# Patient Record
Sex: Female | Born: 1940 | Race: White | Hispanic: No | Marital: Single | Smoking: Former smoker
Health system: Southern US, Community
[De-identification: ages and names within clinical notes are randomized; demographics above are authoritative.]

## PROBLEM LIST (undated history)

## (undated) ENCOUNTER — Emergency Department (HOSPITAL_COMMUNITY): Admission: EM | Payer: Medicare Other | Source: Home / Self Care

## (undated) DIAGNOSIS — R011 Cardiac murmur, unspecified: Secondary | ICD-10-CM

## (undated) DIAGNOSIS — R1314 Dysphagia, pharyngoesophageal phase: Secondary | ICD-10-CM

## (undated) DIAGNOSIS — Z8739 Personal history of other diseases of the musculoskeletal system and connective tissue: Secondary | ICD-10-CM

## (undated) DIAGNOSIS — D649 Anemia, unspecified: Secondary | ICD-10-CM

## (undated) DIAGNOSIS — E7800 Pure hypercholesterolemia, unspecified: Secondary | ICD-10-CM

## (undated) DIAGNOSIS — G629 Polyneuropathy, unspecified: Secondary | ICD-10-CM

## (undated) DIAGNOSIS — M1990 Unspecified osteoarthritis, unspecified site: Secondary | ICD-10-CM

## (undated) DIAGNOSIS — B029 Zoster without complications: Secondary | ICD-10-CM

## (undated) DIAGNOSIS — S92901A Unspecified fracture of right foot, initial encounter for closed fracture: Secondary | ICD-10-CM

## (undated) DIAGNOSIS — R269 Unspecified abnormalities of gait and mobility: Secondary | ICD-10-CM

## (undated) DIAGNOSIS — I10 Essential (primary) hypertension: Secondary | ICD-10-CM

## (undated) DIAGNOSIS — F329 Major depressive disorder, single episode, unspecified: Secondary | ICD-10-CM

## (undated) DIAGNOSIS — G500 Trigeminal neuralgia: Secondary | ICD-10-CM

## (undated) DIAGNOSIS — M8580 Other specified disorders of bone density and structure, unspecified site: Secondary | ICD-10-CM

## (undated) DIAGNOSIS — N189 Chronic kidney disease, unspecified: Secondary | ICD-10-CM

## (undated) DIAGNOSIS — G118 Other hereditary ataxias: Secondary | ICD-10-CM

## (undated) DIAGNOSIS — C649 Malignant neoplasm of unspecified kidney, except renal pelvis: Secondary | ICD-10-CM

## (undated) DIAGNOSIS — F32A Depression, unspecified: Secondary | ICD-10-CM

## (undated) DIAGNOSIS — G119 Hereditary ataxia, unspecified: Secondary | ICD-10-CM

## (undated) HISTORY — DX: Unspecified abnormalities of gait and mobility: R26.9

## (undated) HISTORY — PX: VAGINAL HYSTERECTOMY: SUR661

## (undated) HISTORY — PX: TONSILLECTOMY: SUR1361

## (undated) HISTORY — DX: Unspecified fracture of right foot, initial encounter for closed fracture: S92.901A

## (undated) HISTORY — DX: Zoster without complications: B02.9

## (undated) HISTORY — DX: Other hereditary ataxias: G11.8

## (undated) HISTORY — DX: Polyneuropathy, unspecified: G62.9

## (undated) HISTORY — DX: Dysphagia, pharyngoesophageal phase: R13.14

## (undated) HISTORY — PX: DILATION AND CURETTAGE OF UTERUS: SHX78

## (undated) HISTORY — PX: BACK SURGERY: SHX140

## (undated) HISTORY — DX: Trigeminal neuralgia: G50.0

## (undated) HISTORY — PX: KNEE ARTHROSCOPY: SHX127

## (undated) HISTORY — PX: CATARACT EXTRACTION W/ INTRAOCULAR LENS IMPLANT: SHX1309

## (undated) HISTORY — PX: LUMBAR DISC SURGERY: SHX700

---

## 2010-06-30 ENCOUNTER — Encounter: Admission: RE | Admit: 2010-06-30 | Discharge: 2010-06-30 | Payer: Self-pay | Admitting: Geriatric Medicine

## 2010-08-18 ENCOUNTER — Encounter
Admission: RE | Admit: 2010-08-18 | Discharge: 2010-08-18 | Payer: Self-pay | Source: Home / Self Care | Attending: Geriatric Medicine | Admitting: Geriatric Medicine

## 2010-08-28 ENCOUNTER — Other Ambulatory Visit: Payer: Self-pay | Admitting: Geriatric Medicine

## 2010-08-28 DIAGNOSIS — N281 Cyst of kidney, acquired: Secondary | ICD-10-CM

## 2010-09-16 ENCOUNTER — Ambulatory Visit
Admission: RE | Admit: 2010-09-16 | Discharge: 2010-09-16 | Disposition: A | Discharge status: Home / Self Care | Payer: Medicare Other | Source: Ambulatory Visit | Attending: Geriatric Medicine | Admitting: Geriatric Medicine

## 2010-09-16 DIAGNOSIS — N281 Cyst of kidney, acquired: Secondary | ICD-10-CM

## 2010-09-27 ENCOUNTER — Other Ambulatory Visit: Payer: Self-pay | Admitting: Urology

## 2010-09-27 DIAGNOSIS — N2889 Other specified disorders of kidney and ureter: Secondary | ICD-10-CM

## 2010-10-05 ENCOUNTER — Ambulatory Visit (HOSPITAL_COMMUNITY)
Admission: RE | Admit: 2010-10-05 | Discharge: 2010-10-05 | Disposition: A | Discharge status: Home / Self Care | Payer: Medicare Other | Source: Ambulatory Visit | Attending: Urology | Admitting: Urology

## 2010-10-05 ENCOUNTER — Other Ambulatory Visit: Payer: Self-pay | Admitting: Diagnostic Radiology

## 2010-10-05 DIAGNOSIS — N2889 Other specified disorders of kidney and ureter: Secondary | ICD-10-CM

## 2010-10-05 DIAGNOSIS — I10 Essential (primary) hypertension: Secondary | ICD-10-CM | POA: Insufficient documentation

## 2010-10-05 DIAGNOSIS — K219 Gastro-esophageal reflux disease without esophagitis: Secondary | ICD-10-CM | POA: Insufficient documentation

## 2010-10-05 DIAGNOSIS — N059 Unspecified nephritic syndrome with unspecified morphologic changes: Secondary | ICD-10-CM | POA: Insufficient documentation

## 2010-10-05 LAB — PROTIME-INR
INR: 0.94 (ref 0.00–1.49)
Prothrombin Time: 12.8 seconds (ref 11.6–15.2)

## 2010-10-05 LAB — CBC
MCHC: 34.6 g/dL (ref 30.0–36.0)
Platelets: 263 10*3/uL (ref 150–400)
RDW: 12.6 % (ref 11.5–15.5)

## 2010-10-19 ENCOUNTER — Ambulatory Visit
Admission: RE | Admit: 2010-10-19 | Discharge: 2010-10-19 | Disposition: A | Discharge status: Home / Self Care | Payer: Self-pay | Source: Ambulatory Visit | Attending: Geriatric Medicine | Admitting: Geriatric Medicine

## 2010-10-19 ENCOUNTER — Other Ambulatory Visit: Payer: Self-pay | Admitting: Geriatric Medicine

## 2010-10-19 DIAGNOSIS — W19XXXA Unspecified fall, initial encounter: Secondary | ICD-10-CM

## 2010-12-23 ENCOUNTER — Other Ambulatory Visit (HOSPITAL_COMMUNITY): Payer: Self-pay | Admitting: *Deleted

## 2010-12-23 ENCOUNTER — Other Ambulatory Visit: Payer: Self-pay | Admitting: Urology

## 2010-12-23 DIAGNOSIS — D49519 Neoplasm of unspecified behavior of unspecified kidney: Secondary | ICD-10-CM

## 2011-01-18 ENCOUNTER — Other Ambulatory Visit: Payer: Self-pay | Admitting: Geriatric Medicine

## 2011-01-18 ENCOUNTER — Ambulatory Visit
Admission: RE | Admit: 2011-01-18 | Discharge: 2011-01-18 | Disposition: A | Discharge status: Home / Self Care | Payer: Medicare Other | Source: Ambulatory Visit | Attending: Geriatric Medicine | Admitting: Geriatric Medicine

## 2011-01-18 DIAGNOSIS — W19XXXA Unspecified fall, initial encounter: Secondary | ICD-10-CM

## 2011-01-25 ENCOUNTER — Other Ambulatory Visit (HOSPITAL_COMMUNITY): Payer: Medicare Other

## 2011-02-01 ENCOUNTER — Other Ambulatory Visit (HOSPITAL_COMMUNITY): Payer: Medicare Other

## 2011-02-08 ENCOUNTER — Ambulatory Visit (HOSPITAL_COMMUNITY)
Admission: RE | Admit: 2011-02-08 | Discharge: 2011-02-08 | Disposition: A | Discharge status: Home / Self Care | Payer: Medicare Other | Source: Ambulatory Visit | Attending: Urology | Admitting: Urology

## 2011-02-08 DIAGNOSIS — D49519 Neoplasm of unspecified behavior of unspecified kidney: Secondary | ICD-10-CM

## 2011-02-08 DIAGNOSIS — D4959 Neoplasm of unspecified behavior of other genitourinary organ: Secondary | ICD-10-CM | POA: Insufficient documentation

## 2011-02-08 DIAGNOSIS — N289 Disorder of kidney and ureter, unspecified: Secondary | ICD-10-CM | POA: Insufficient documentation

## 2011-02-08 DIAGNOSIS — K8020 Calculus of gallbladder without cholecystitis without obstruction: Secondary | ICD-10-CM | POA: Insufficient documentation

## 2011-02-08 DIAGNOSIS — Q618 Other cystic kidney diseases: Secondary | ICD-10-CM | POA: Insufficient documentation

## 2011-02-08 DIAGNOSIS — K7689 Other specified diseases of liver: Secondary | ICD-10-CM | POA: Insufficient documentation

## 2011-02-08 LAB — CREATININE, SERUM: GFR calc Af Amer: >60 mL/min (ref >60)

## 2011-02-08 MED ORDER — GADOBENATE DIMEGLUMINE 529 MG/ML IV SOLN
20.0000 mL | Freq: Once | INTRAVENOUS | Status: AC | PRN
  Administered 2011-02-08: 20 mL via INTRAVENOUS

## 2011-02-28 ENCOUNTER — Other Ambulatory Visit: Payer: Self-pay | Admitting: Urology

## 2011-02-28 ENCOUNTER — Ambulatory Visit (HOSPITAL_COMMUNITY)
Admission: RE | Admit: 2011-02-28 | Discharge: 2011-02-28 | Disposition: A | Discharge status: Home / Self Care | Payer: Medicare Other | Source: Ambulatory Visit | Attending: Urology | Admitting: Urology

## 2011-02-28 DIAGNOSIS — D49519 Neoplasm of unspecified behavior of unspecified kidney: Secondary | ICD-10-CM

## 2011-02-28 DIAGNOSIS — X58XXXA Exposure to other specified factors, initial encounter: Secondary | ICD-10-CM | POA: Insufficient documentation

## 2011-02-28 DIAGNOSIS — R059 Cough, unspecified: Secondary | ICD-10-CM | POA: Insufficient documentation

## 2011-02-28 DIAGNOSIS — C649 Malignant neoplasm of unspecified kidney, except renal pelvis: Secondary | ICD-10-CM | POA: Insufficient documentation

## 2011-02-28 DIAGNOSIS — Z79899 Other long term (current) drug therapy: Secondary | ICD-10-CM | POA: Insufficient documentation

## 2011-02-28 DIAGNOSIS — I10 Essential (primary) hypertension: Secondary | ICD-10-CM | POA: Insufficient documentation

## 2011-02-28 DIAGNOSIS — R05 Cough: Secondary | ICD-10-CM | POA: Insufficient documentation

## 2011-02-28 DIAGNOSIS — S22009A Unspecified fracture of unspecified thoracic vertebra, initial encounter for closed fracture: Secondary | ICD-10-CM | POA: Insufficient documentation

## 2011-04-05 ENCOUNTER — Emergency Department (HOSPITAL_COMMUNITY)
Admission: EM | Admit: 2011-04-05 | Discharge: 2011-04-05 | Disposition: A | Discharge status: Home / Self Care | Payer: Medicare Other | Attending: Emergency Medicine | Admitting: Emergency Medicine

## 2011-04-05 ENCOUNTER — Emergency Department (HOSPITAL_COMMUNITY): Payer: Medicare Other

## 2011-04-05 DIAGNOSIS — Z79899 Other long term (current) drug therapy: Secondary | ICD-10-CM | POA: Insufficient documentation

## 2011-04-05 DIAGNOSIS — W07XXXA Fall from chair, initial encounter: Secondary | ICD-10-CM | POA: Insufficient documentation

## 2011-04-05 DIAGNOSIS — I10 Essential (primary) hypertension: Secondary | ICD-10-CM | POA: Insufficient documentation

## 2011-04-05 DIAGNOSIS — IMO0002 Reserved for concepts with insufficient information to code with codable children: Secondary | ICD-10-CM | POA: Insufficient documentation

## 2011-04-05 DIAGNOSIS — R279 Unspecified lack of coordination: Secondary | ICD-10-CM | POA: Insufficient documentation

## 2011-04-05 DIAGNOSIS — S0100XA Unspecified open wound of scalp, initial encounter: Secondary | ICD-10-CM | POA: Insufficient documentation

## 2011-04-05 DIAGNOSIS — K219 Gastro-esophageal reflux disease without esophagitis: Secondary | ICD-10-CM | POA: Insufficient documentation

## 2011-04-10 ENCOUNTER — Emergency Department (HOSPITAL_COMMUNITY): Payer: Medicare Other

## 2011-04-10 ENCOUNTER — Emergency Department (HOSPITAL_COMMUNITY)
Admission: EM | Admit: 2011-04-10 | Discharge: 2011-04-10 | Disposition: A | Discharge status: Home / Self Care | Payer: Medicare Other | Attending: Emergency Medicine | Admitting: Emergency Medicine

## 2011-04-10 DIAGNOSIS — R0602 Shortness of breath: Secondary | ICD-10-CM | POA: Insufficient documentation

## 2011-04-10 DIAGNOSIS — K219 Gastro-esophageal reflux disease without esophagitis: Secondary | ICD-10-CM | POA: Insufficient documentation

## 2011-04-10 DIAGNOSIS — Z79899 Other long term (current) drug therapy: Secondary | ICD-10-CM | POA: Insufficient documentation

## 2011-04-10 DIAGNOSIS — R918 Other nonspecific abnormal finding of lung field: Secondary | ICD-10-CM | POA: Insufficient documentation

## 2011-04-10 DIAGNOSIS — R5381 Other malaise: Secondary | ICD-10-CM | POA: Insufficient documentation

## 2011-04-10 DIAGNOSIS — I10 Essential (primary) hypertension: Secondary | ICD-10-CM | POA: Insufficient documentation

## 2011-04-10 LAB — POCT I-STAT, CHEM 8
BUN: 21 mg/dL (ref 6–23)
Calcium, Ion: 1.18 mmol/L (ref 1.12–1.32)
Chloride: 105 mEq/L (ref 96–112)
Creatinine, Ser: 0.9 mg/dL (ref 0.50–1.10)
Glucose, Bld: 94 mg/dL (ref 70–99)

## 2011-04-10 LAB — URINALYSIS, ROUTINE W REFLEX MICROSCOPIC
Bilirubin Urine: NEGATIVE
Glucose, UA: NEGATIVE mg/dL
Ketones, ur: NEGATIVE mg/dL
pH: 6 (ref 5.0–8.0)

## 2011-04-10 LAB — CBC
Hemoglobin: 11.9 g/dL — ABNORMAL LOW (ref 12.0–15.0)
MCH: 29.3 pg (ref 26.0–34.0)
MCV: 84.5 fL (ref 78.0–100.0)
Platelets: 332 10*3/uL (ref 150–400)
RBC: 4.06 MIL/uL (ref 3.87–5.11)

## 2011-04-10 LAB — DIFFERENTIAL
Eosinophils Absolute: 0.1 10*3/uL (ref 0.0–0.7)
Lymphs Abs: 1.9 10*3/uL (ref 0.7–4.0)
Monocytes Relative: 5 % (ref 3–12)
Neutrophils Relative %: 68 % (ref 43–77)

## 2011-08-10 ENCOUNTER — Other Ambulatory Visit: Payer: Self-pay | Admitting: Urology

## 2011-08-10 DIAGNOSIS — D49519 Neoplasm of unspecified behavior of unspecified kidney: Secondary | ICD-10-CM

## 2011-08-17 ENCOUNTER — Other Ambulatory Visit (HOSPITAL_COMMUNITY): Payer: Self-pay | Admitting: Orthopaedic Surgery

## 2011-08-17 DIAGNOSIS — M545 Low back pain, unspecified: Secondary | ICD-10-CM

## 2011-08-23 ENCOUNTER — Other Ambulatory Visit (HOSPITAL_COMMUNITY): Payer: Medicare Other

## 2011-08-24 ENCOUNTER — Ambulatory Visit (HOSPITAL_COMMUNITY)
Admission: RE | Admit: 2011-08-24 | Discharge: 2011-08-24 | Disposition: A | Discharge status: Home / Self Care | Payer: Medicare Other | Source: Ambulatory Visit | Attending: Orthopaedic Surgery | Admitting: Orthopaedic Surgery

## 2011-08-24 ENCOUNTER — Ambulatory Visit (HOSPITAL_COMMUNITY)
Admission: RE | Admit: 2011-08-24 | Discharge: 2011-08-24 | Disposition: A | Discharge status: Home / Self Care | Payer: Medicare Other | Source: Ambulatory Visit | Attending: Urology | Admitting: Urology

## 2011-08-24 DIAGNOSIS — K8020 Calculus of gallbladder without cholecystitis without obstruction: Secondary | ICD-10-CM | POA: Insufficient documentation

## 2011-08-24 DIAGNOSIS — N281 Cyst of kidney, acquired: Secondary | ICD-10-CM | POA: Insufficient documentation

## 2011-08-24 DIAGNOSIS — M545 Low back pain, unspecified: Secondary | ICD-10-CM

## 2011-08-24 DIAGNOSIS — M79609 Pain in unspecified limb: Secondary | ICD-10-CM | POA: Insufficient documentation

## 2011-08-24 DIAGNOSIS — M47817 Spondylosis without myelopathy or radiculopathy, lumbosacral region: Secondary | ICD-10-CM | POA: Insufficient documentation

## 2011-08-24 DIAGNOSIS — M51379 Other intervertebral disc degeneration, lumbosacral region without mention of lumbar back pain or lower extremity pain: Secondary | ICD-10-CM | POA: Insufficient documentation

## 2011-08-24 DIAGNOSIS — M5137 Other intervertebral disc degeneration, lumbosacral region: Secondary | ICD-10-CM | POA: Insufficient documentation

## 2011-08-24 DIAGNOSIS — D49519 Neoplasm of unspecified behavior of unspecified kidney: Secondary | ICD-10-CM

## 2011-08-24 DIAGNOSIS — K7689 Other specified diseases of liver: Secondary | ICD-10-CM | POA: Insufficient documentation

## 2011-08-24 DIAGNOSIS — D3500 Benign neoplasm of unspecified adrenal gland: Secondary | ICD-10-CM | POA: Insufficient documentation

## 2011-08-24 DIAGNOSIS — Z9181 History of falling: Secondary | ICD-10-CM | POA: Insufficient documentation

## 2011-08-24 LAB — CREATININE, SERUM
Creatinine, Ser: 0.87 mg/dL (ref 0.50–1.10)
GFR calc Af Amer: 78 mL/min — ABNORMAL LOW (ref >90)
GFR calc non Af Amer: 67 mL/min — ABNORMAL LOW (ref >90)

## 2011-08-24 MED ORDER — GADOBENATE DIMEGLUMINE 529 MG/ML IV SOLN
15.0000 mL | Freq: Once | INTRAVENOUS | Status: AC | PRN
  Administered 2011-08-24: 14 mL via INTRAVENOUS

## 2011-09-13 ENCOUNTER — Other Ambulatory Visit (HOSPITAL_COMMUNITY): Payer: Self-pay | Admitting: Urology

## 2011-09-13 ENCOUNTER — Ambulatory Visit (HOSPITAL_COMMUNITY)
Admission: RE | Admit: 2011-09-13 | Discharge: 2011-09-13 | Disposition: A | Discharge status: Home / Self Care | Payer: Medicare Other | Source: Ambulatory Visit | Attending: Urology | Admitting: Urology

## 2011-09-13 DIAGNOSIS — D4959 Neoplasm of unspecified behavior of other genitourinary organ: Secondary | ICD-10-CM | POA: Insufficient documentation

## 2011-09-21 ENCOUNTER — Other Ambulatory Visit: Payer: Self-pay | Admitting: Urology

## 2011-09-29 ENCOUNTER — Encounter (HOSPITAL_COMMUNITY): Payer: Self-pay | Admitting: Pharmacy Technician

## 2011-09-30 ENCOUNTER — Encounter (HOSPITAL_COMMUNITY): Payer: Self-pay

## 2011-09-30 ENCOUNTER — Encounter (HOSPITAL_COMMUNITY)
Admission: RE | Admit: 2011-09-30 | Discharge: 2011-09-30 | Disposition: A | Discharge status: Home / Self Care | Payer: Medicare Other | Source: Ambulatory Visit | Attending: Urology | Admitting: Urology

## 2011-09-30 HISTORY — DX: Major depressive disorder, single episode, unspecified: F32.9

## 2011-09-30 HISTORY — DX: Depression, unspecified: F32.A

## 2011-09-30 HISTORY — DX: Essential (primary) hypertension: I10

## 2011-09-30 HISTORY — DX: Chronic kidney disease, unspecified: N18.9

## 2011-09-30 LAB — BASIC METABOLIC PANEL WITH GFR
BUN: 28 mg/dL — ABNORMAL HIGH (ref 6–23)
CO2: 27 meq/L (ref 19–32)
Calcium: 9.4 mg/dL (ref 8.4–10.5)
Chloride: 103 meq/L (ref 96–112)
Creatinine, Ser: 0.75 mg/dL (ref 0.50–1.10)
GFR calc Af Amer: >90 mL/min
GFR calc non Af Amer: 85 mL/min — ABNORMAL LOW
Glucose, Bld: 86 mg/dL (ref 70–99)
Potassium: 5 meq/L (ref 3.5–5.1)
Sodium: 139 meq/L (ref 135–145)

## 2011-09-30 LAB — CBC
HCT: 36.9 % (ref 36.0–46.0)
Hemoglobin: 12.5 g/dL (ref 12.0–15.0)
MCV: 87 fL (ref 78.0–100.0)
RBC: 4.24 MIL/uL (ref 3.87–5.11)
WBC: 7.3 10*3/uL (ref 4.0–10.5)

## 2011-09-30 LAB — SURGICAL PCR SCREEN
MRSA, PCR: NEGATIVE
Staphylococcus aureus: NEGATIVE

## 2011-09-30 LAB — ABO/RH: ABO/RH(D): AB POS

## 2011-09-30 NOTE — Patient Instructions (Signed)
22 Wendy Erickson  09/30/2011   Your procedure is scheduled on:  10/06/11 1015am-115pm  Report to [facility] at 0800 AM.  Call this number if you have problems the morning of surgery: [phone]   Remember:   Do not eat food:After Midnight.  May have clear liquids:until Midnight .  Marland Kitchen  Take these medicines the morning of surgery with A SIP OF WATER:   Do not wear jewelry, make-up or nail polish.  Do not wear lotions, powders, or perfumes.   Do not shave 48 hours prior to surgery.  Do not bring valuables to the hospital.  Contacts, dentures or bridgework may not be worn into surgery.  Leave suitcase in the car. After surgery it may be brought to your room.  For patients admitted to the hospital, checkout time is 11:00 AM the day of discharge.   Special Instructions: CHG Shower Use Special Wash: 1/2 bottle night before surgery and 1/2 bottle morning of surgery. Shower chin to toes with CHG.  Wash face and private parts with regular soap.     Please read over the following fact sheets that you were given: MRSA Information, Blood Transfusion Fact Sheet, Incentive Spirometry Fact Sheet, coughing and deep breathing exercises

## 2011-09-30 NOTE — Pre-Procedure Instructions (Signed)
08/19/11 LOV note with Dr Merlene Laughter ( PCP) on chart  EKG 08/19/11 on chart

## 2011-09-30 NOTE — Pre-Procedure Instructions (Signed)
09/30/11 Pt aware of bowel prep.

## 2011-10-05 NOTE — H&P (Signed)
Chief Complaint  Left renal neoplasm   Reason For Visit  Reason for consult: To consider surgical treatment for her left renal neoplasm Physician requesting consult: Dr. Marcine Matar PCP: Dr. 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 Edwyna Shell. She moved to [facility] and established care with Dr. Retta Diones who has been following her renal lesion since January 2012.  A biopsy of her lesion was performed in February 2012 and was non-diagnostic. Her lesion has been followed since with MR imaging.  Her most recent imaging with MR in January 2012 indicated interval growth of her renal lesion from 6.5 cm to 7.1 cm over a 6 month period with areas in the rim of the mass which appeared more thickened and possibly enhancing. She has been asymptomatic with no evidence of hematuria or flank pain. She supposedly carries a history of CKD stage III based on review of her records although she denies any chronic renal insufficiency and her recent lab work does not suggest this diagnosis.  I independently reviewed her recent MRI which indicates a 7.1 cm left renal complex cyst with evidence of a thickend rim and possibly some enhancing components. Her mass encompasses the entire upper pole of the left kidney and extends down to the hilum of the kidney.  There is no regional lymphadenopathy, contralateral kidney abnormalities except for simple cysts, no adrenal abnormalities, no renal vein or IVC involvement. 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   Past Medical History Problems  1. History of  Anxiety (Symptom) 300.00 2. History of   Arthritis V13.4 3. History of  Cerebellar Ataxia 334.3 4. History of  Depression 311 5. History of  Eczema 692.9 6. History of  Gastric Ulcer 531.90 7. History of  Hypertension 401.9 8. History of  Osteopenia 733.90  Surgical History Problems  1. History of  Back Surgery 2. History of  Hysterectomy V45.77 3. History of  Knee Surgery Right 4. History of  Tonsillectomy  Current Meds 1. Alendronate Sodium 70 MG Oral Tablet; TAKE 1 TABLET Weekly; Therapy: (Recorded:18Jan2012)  to 2. AmLODIPine Besylate 2.5 MG Oral Tablet; Therapy: 18Oct2012 to 3. Budeprion XL 150 MG TB24; Take 1 tablet daily; Therapy: (Recorded:18Jan2012) to 4. CloNIDine HCl 0.1 MG Oral Tablet; Take 1 tablet twice daily; Therapy: (Recorded:18Jan2012) to 5. FLUoxetine HCl 20 MG Oral Capsule; TAKE 1 CAPSULE Daily; Therapy: (Recorded:18Jan2012)  to 6. Hydrocodone-Acetaminophen 5-500 MG Oral Tablet; Therapy: (Recorded:21Jan2013) to 7. Hydrocortisone Butyrate 0.1 % External Cream; Therapy: (Recorded:21Jan2013) to 8. Mapap Arthritis Pain 650 MG Oral Tablet Extended Release; Therapy: (Recorded:21Jan2013) to 9. Omeprazole 20 MG Oral Capsule Delayed Release; Therapy: (Recorded:18Jan2012) to 10. Vitamin D TABS; 2000 iu qd; Therapy: (Recorded:18Jan2012) to  Allergies Medication  1. No Known Drug Allergies  Family History Problems  1. Family history of  Ataxia father's side & 3 out of 4 siblings 2. Family history of  Death In The Family Mother Deceased at age 74 Denied  3. Family history of  Kidney Cancer  Social History Problems    Alcohol Use 1  Former Smoker V15.82 smoked 1 ppd for 30 yrs & quit in 2010   Marital History - Divorced V61.03   Retired From Work  Review of Systems Genitourinary, constitutional, skin, eye, otolaryngeal, hematologic/lymphatic, cardiovascular, pulmonary, endocrine, musculoskeletal, gastrointestinal, neurological and psychiatric system(s) were reviewed and pertinent findings if  present are noted.  Genitourinary: no hematuria.  Cardiovascular: no chest pain.  Respiratory: no shortness of breath.    Vitals Vital Signs [Data Includes: Last 1 Day]  05Feb2013 11:15AM  Blood Pressure: 145 / 87 Temperature: 98.6 F Heart Rate: 62  Physical Exam Constitutional: Well nourished and well developed . No acute distress.  ENT:. The ears and nose are normal in appearance.  Neck: The appearance of the neck is normal and no neck mass is present.  Pulmonary: No respiratory distress, normal respiratory rhythm and effort and clear bilateral breath sounds.  Cardiovascular: Heart rate and rhythm are normal . No peripheral edema.  Abdomen: The abdomen is soft and nontender. No masses are palpated. No CVA tenderness. No hernias are palpable. No hepatosplenomegaly noted.  Lymphatics: The femoral and inguinal nodes are not enlarged or tender.  Skin: Normal skin turgor, no visible rash and no visible skin lesions.  Neuro/Psych:. Mood and affect are appropriate.    Results/Data Urine [Data Includes: Last 1 Day]   05Feb2013  COLOR YELLOW   APPEARANCE CLEAR   SPECIFIC GRAVITY <1.005   pH 5.5   GLUCOSE NEG mg/dL  BILIRUBIN NEG   KETONE NEG mg/dL  BLOOD NEG   PROTEIN NEG mg/dL  UROBILINOGEN 0.2 mg/dL  NITRITE NEG   LEUKOCYTE ESTERASE NEG   Selected Results  COMPREHENSIVE METABOLIC PANEL 05Feb2013 12:24PM Wendy Erickson  SPECIMEN TYPE: BLOOD   Test Name Result Flag Reference  GLUCOSE 87 mg/dL  96-04  BUN 24 mg/dL H 5-40  CREATININE 9.81 mg/dL  1.91-4.78  SODIUM 295 mEq/L  135-145  POTASSIUM 4.5 mEq/L  3.5-5.3  CHLORIDE 103 mEq/L  96-112  CO2 28 mEq/L  19-32  CALCIUM 9.9 mg/dL  6.2-13.0  TOTAL PROTEIN 6.9 g/dL  8.6-5.7  ALBUMIN 4.6 g/dL  8.4-6.9  AST/SGOT 14 U/L  0-37  ALT/SGPT 9 U/L  0-35  ALKALINE PHOSPHATASE 39 U/L  39-117  BILIRUBIN, TOTAL 0.4 mg/dL  6.2-9.5  Est GFR, African American 71 mL/min    Est GFR, NonAfrican American 62 mL/min    The estimated GFR is a  calculation valid for adults (91 to 69 years old) that uses the CKD-EPI algorithm to adjust for age and sex. It is not to be used for children, pregnant women, hospitalized patients, patients on dialysis, or with rapidly changing kidney function. According to the NKDEP, eGFR >89 is normal, 60-89 shows mild impairment, 30-59 shows moderate impairment, 15-29 shows severe impairment and <15 is ESRD.     I have reviewed her recent MRI, medical records, and laboratory studies with findings as dictated above.  Assessment Assessed  1. Renal Neoplasm 239.5  Plan  Health Maintenance (V70.0)  1. UA With REFLEX  Done: 05Feb2013 10:50AM Renal Neoplasm (239.5)  2. Follow-up Schedule Surgery Office  Follow-up  Requested for: 05Feb2013  COMPREHENSIVE METABOLIC PANEL  Status: Resulted - Requires Verification  Done: 01Jan0001 12:00AM Ordered Today; For: Renal Neoplasm (239.5); Ordered By: Wendy Erickson  Due: 07Feb2013 Marked Important; Last Updated By: Carlyon Prows   Discussion/Summary  1. 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   .  The patient was provided information regarding their renal mass including the relative  risk of benign versus malignant pathology and the natural history of renal cell carcinoma and other possible malignancies of the kidney. The role of renal biopsy, laboratory testing, and imaging studies to further characterize renal masses and/or the presence of metastatic disease were explained. We discussed the role of active surveillance, surgical therapy with both radical nephrectomy and nephron-sparing surgery, and ablative therapy in the treatment of renal masses. In addition, we discussed our goals of providing an accurate diagnosis and oncologic control while maintaining optimal renal function as appropriate based on the size, location, and complexity of their renal mass as well as their co-morbidities.    We have discussed the risks of  treatment in detail including but not limited to bleeding, infection, heart attack, stroke, death, venothromoboembolism, cancer recurrence, injury/damage to surrounding organs and structures, urine leak, the possibility of open surgical conversion for patients undergoing minimally invasive surgery, the risk of developing chronic kidney disease and its associated implications, and the potential risk of end stage renal disease possibly necessitating dialysis.      I reviewed Ms. Tash situation with her and her son today and specifically the risks of her mass being benign versus malignant. Considering that her her mass is increasing in size and does have concerning features as well as the fact that her biopsy was nondiagnostic, she understands that the only way to get a definitive diagnosis would be surgical resection. Considering the size of her mass as well as the location which does extend down to the renal hilum, I do think a partial nephrectomy would be quite complex and would require an open surgery with a large subcostal or flank incision. Considering the fact that she does appear to have normal renal function at this time and with a normal contralateral kidney on imaging and considering the fact that it would be optimal to minimize her postoperative pain and incision size so that she can resume ambulation with her walker after surgery, I have recommended that she proceed with a left laparoscopic radical nephrectomy. She will complete her metastatic evaluation with chest imaging and laboratory studies today and will be scheduled for surgery in the near future.  Cc: Dr. Marcine Matar Dr. Merlene Laughter    SignaturesElectronically signed by : Wendy Erickson, M.D.; Sep 13 2011 11:14PM

## 2011-10-06 ENCOUNTER — Encounter (HOSPITAL_COMMUNITY): Payer: Self-pay | Admitting: Anesthesiology

## 2011-10-06 ENCOUNTER — Inpatient Hospital Stay (HOSPITAL_COMMUNITY)
Admission: RE | Admit: 2011-10-06 | Discharge: 2011-10-10 | DRG: 657 | Disposition: A | Discharge status: Skilled Nursing Facility | Payer: Medicare Other | Source: Ambulatory Visit | Attending: Urology | Admitting: Urology

## 2011-10-06 ENCOUNTER — Encounter (HOSPITAL_COMMUNITY)
Admission: RE | Disposition: A | Discharge status: Skilled Nursing Facility | Payer: Self-pay | Source: Ambulatory Visit | Attending: Urology

## 2011-10-06 ENCOUNTER — Ambulatory Visit (HOSPITAL_COMMUNITY): Payer: Medicare Other | Admitting: Anesthesiology

## 2011-10-06 ENCOUNTER — Encounter (HOSPITAL_COMMUNITY): Payer: Self-pay | Admitting: *Deleted

## 2011-10-06 DIAGNOSIS — M899 Disorder of bone, unspecified: Secondary | ICD-10-CM | POA: Diagnosis present

## 2011-10-06 DIAGNOSIS — F3289 Other specified depressive episodes: Secondary | ICD-10-CM | POA: Diagnosis present

## 2011-10-06 DIAGNOSIS — G119 Hereditary ataxia, unspecified: Secondary | ICD-10-CM | POA: Diagnosis present

## 2011-10-06 DIAGNOSIS — F329 Major depressive disorder, single episode, unspecified: Secondary | ICD-10-CM | POA: Diagnosis present

## 2011-10-06 DIAGNOSIS — C649 Malignant neoplasm of unspecified kidney, except renal pelvis: Principal | ICD-10-CM | POA: Diagnosis present

## 2011-10-06 DIAGNOSIS — Z01812 Encounter for preprocedural laboratory examination: Secondary | ICD-10-CM

## 2011-10-06 DIAGNOSIS — F411 Generalized anxiety disorder: Secondary | ICD-10-CM | POA: Diagnosis present

## 2011-10-06 DIAGNOSIS — I10 Essential (primary) hypertension: Secondary | ICD-10-CM | POA: Diagnosis present

## 2011-10-06 HISTORY — PX: LAPAROSCOPIC NEPHRECTOMY: SHX1930

## 2011-10-06 LAB — BASIC METABOLIC PANEL
BUN: 17 mg/dL (ref 6–23)
Chloride: 102 mEq/L (ref 96–112)
Creatinine, Ser: 0.85 mg/dL (ref 0.50–1.10)
GFR calc Af Amer: 80 mL/min — ABNORMAL LOW (ref >90)
GFR calc non Af Amer: 69 mL/min — ABNORMAL LOW (ref >90)
Potassium: 3.7 mEq/L (ref 3.5–5.1)

## 2011-10-06 LAB — HEMOGLOBIN AND HEMATOCRIT, BLOOD: Hemoglobin: 13 g/dL (ref 12.0–15.0)

## 2011-10-06 LAB — TYPE AND SCREEN

## 2011-10-06 SURGERY — NEPHRECTOMY, RADICAL, LAPAROSCOPIC, ADULT
Anesthesia: General | Site: Abdomen | Laterality: Left | Wound class: Clean

## 2011-10-06 MED ORDER — VITAMIN D3 25 MCG (1000 UNIT) PO TABS
2000.0000 [IU] | ORAL_TABLET | Freq: Every day | ORAL | Status: DC
  Administered 2011-10-07 – 2011-10-10 (×4): 2000 [IU] via ORAL
  Filled 2011-10-06 (×5): qty 2

## 2011-10-06 MED ORDER — CLONIDINE HCL 0.1 MG PO TABS
0.1000 mg | ORAL_TABLET | Freq: Two times a day (BID) | ORAL | Status: DC
  Administered 2011-10-06 – 2011-10-10 (×8): 0.1 mg via ORAL
  Filled 2011-10-06 (×11): qty 1

## 2011-10-06 MED ORDER — FENTANYL CITRATE 0.05 MG/ML IJ SOLN
INTRAMUSCULAR | Status: DC | PRN
  Administered 2011-10-06 (×4): 50 ug via INTRAVENOUS

## 2011-10-06 MED ORDER — ACETAMINOPHEN 10 MG/ML IV SOLN
INTRAVENOUS | Status: AC
  Filled 2011-10-06: qty 100

## 2011-10-06 MED ORDER — SODIUM CHLORIDE 0.9 % IV SOLN
INTRAVENOUS | Status: DC
  Administered 2011-10-06: 1000 mL via INTRAVENOUS

## 2011-10-06 MED ORDER — DEXTROSE-NACL 5-0.45 % IV SOLN
INTRAVENOUS | Status: DC
  Administered 2011-10-06 – 2011-10-07 (×4): via INTRAVENOUS

## 2011-10-06 MED ORDER — ONDANSETRON HCL 4 MG/2ML IJ SOLN: 4.0000 mg | INTRAMUSCULAR | Status: DC | PRN

## 2011-10-06 MED ORDER — DOCUSATE SODIUM 100 MG PO CAPS
100.0000 mg | ORAL_CAPSULE | Freq: Two times a day (BID) | ORAL | Status: DC
  Administered 2011-10-06 – 2011-10-07 (×2): 100 mg via ORAL
  Filled 2011-10-06 (×5): qty 1

## 2011-10-06 MED ORDER — CEFAZOLIN SODIUM 1-5 GM-% IV SOLN
INTRAVENOUS | Status: AC
  Filled 2011-10-06: qty 50

## 2011-10-06 MED ORDER — BUPIVACAINE-EPINEPHRINE PF 0.25-1:200000 % IJ SOLN
INTRAMUSCULAR | Status: AC
  Filled 2011-10-06: qty 30

## 2011-10-06 MED ORDER — CISATRACURIUM BESYLATE 2 MG/ML IV SOLN
INTRAVENOUS | Status: DC | PRN
  Administered 2011-10-06: 8 mg via INTRAVENOUS
  Administered 2011-10-06: 2 mg via INTRAVENOUS

## 2011-10-06 MED ORDER — LACTATED RINGERS IV SOLN
INTRAVENOUS | Status: DC
  Administered 2011-10-06: 1000 mL via INTRAVENOUS

## 2011-10-06 MED ORDER — ONDANSETRON HCL 4 MG/2ML IJ SOLN
INTRAMUSCULAR | Status: DC | PRN
  Administered 2011-10-06 (×4): 1 mg via INTRAVENOUS

## 2011-10-06 MED ORDER — ZOLPIDEM TARTRATE 5 MG PO TABS: 5.0000 mg | ORAL_TABLET | Freq: Every evening | ORAL | Status: DC | PRN

## 2011-10-06 MED ORDER — HYDROMORPHONE HCL PF 1 MG/ML IJ SOLN
INTRAMUSCULAR | Status: AC
  Filled 2011-10-06: qty 1

## 2011-10-06 MED ORDER — CLONIDINE HCL 0.1 MG/24HR TD PTWK
0.1000 mg | MEDICATED_PATCH | TRANSDERMAL | Status: DC
  Administered 2011-10-06: 0.1 mg via TRANSDERMAL
  Filled 2011-10-06: qty 1

## 2011-10-06 MED ORDER — LACTATED RINGERS IV SOLN
INTRAVENOUS | Status: DC | PRN
  Administered 2011-10-06: 10:00:00 via INTRAVENOUS

## 2011-10-06 MED ORDER — HYDROMORPHONE HCL PF 1 MG/ML IJ SOLN
INTRAMUSCULAR | Status: DC | PRN
  Administered 2011-10-06: 1.5 mg via INTRAVENOUS
  Administered 2011-10-06: 0.5 mg via INTRAVENOUS

## 2011-10-06 MED ORDER — BUPROPION HCL ER (XL) 150 MG PO TB24
150.0000 mg | ORAL_TABLET | Freq: Every day | ORAL | Status: DC
  Administered 2011-10-07 – 2011-10-10 (×4): 150 mg via ORAL
  Filled 2011-10-06 (×5): qty 1

## 2011-10-06 MED ORDER — AMLODIPINE BESYLATE 2.5 MG PO TABS
2.5000 mg | ORAL_TABLET | Freq: Every day | ORAL | Status: DC
  Administered 2011-10-07: 2.5 mg via ORAL
  Filled 2011-10-06 (×2): qty 1

## 2011-10-06 MED ORDER — CEFAZOLIN SODIUM 1-5 GM-% IV SOLN
1.0000 g | INTRAVENOUS | Status: AC
  Administered 2011-10-06: 1 g via INTRAVENOUS

## 2011-10-06 MED ORDER — HYDROMORPHONE HCL PF 1 MG/ML IJ SOLN
INTRAMUSCULAR | Status: AC
  Administered 2011-10-06: 0.5 mg via INTRAVENOUS
  Filled 2011-10-06: qty 1

## 2011-10-06 MED ORDER — BUPIVACAINE LIPOSOME 1.3 % IJ SUSP
20.0000 mL | Freq: Once | INTRAMUSCULAR | Status: AC
  Administered 2011-10-06: 40 mL
  Filled 2011-10-06: qty 20

## 2011-10-06 MED ORDER — PROMETHAZINE HCL 25 MG/ML IJ SOLN: 6.2500 mg | INTRAMUSCULAR | Status: DC | PRN

## 2011-10-06 MED ORDER — MIDAZOLAM HCL 5 MG/5ML IJ SOLN
INTRAMUSCULAR | Status: DC | PRN
  Administered 2011-10-06: .5 mg via INTRAVENOUS

## 2011-10-06 MED ORDER — HYDROCODONE-ACETAMINOPHEN 5-500 MG PO TABS: 1.0000 | ORAL_TABLET | Freq: Four times a day (QID) | ORAL | Status: DC | PRN

## 2011-10-06 MED ORDER — FLUOXETINE HCL 20 MG PO CAPS
20.0000 mg | ORAL_CAPSULE | Freq: Every day | ORAL | Status: DC
  Administered 2011-10-07 – 2011-10-10 (×4): 20 mg via ORAL
  Filled 2011-10-06 (×5): qty 1

## 2011-10-06 MED ORDER — ACETAMINOPHEN 10 MG/ML IV SOLN
1000.0000 mg | Freq: Four times a day (QID) | INTRAVENOUS | Status: AC
  Administered 2011-10-06 – 2011-10-07 (×4): 1000 mg via INTRAVENOUS
  Filled 2011-10-06 (×3): qty 100

## 2011-10-06 MED ORDER — CEFAZOLIN SODIUM 1-5 GM-% IV SOLN
1.0000 g | Freq: Three times a day (TID) | INTRAVENOUS | Status: AC
  Administered 2011-10-06 – 2011-10-07 (×2): 1 g via INTRAVENOUS
  Filled 2011-10-06 (×3): qty 50

## 2011-10-06 MED ORDER — SODIUM CHLORIDE 0.9 % IV SOLN
INTRAVENOUS | Status: DC | PRN
  Administered 2011-10-06: 10:00:00 via INTRAVENOUS

## 2011-10-06 MED ORDER — HYDROMORPHONE HCL PF 1 MG/ML IJ SOLN
0.2500 mg | INTRAMUSCULAR | Status: DC | PRN
  Administered 2011-10-06: 0.5 mg via INTRAVENOUS
  Administered 2011-10-06 (×2): 0.25 mg via INTRAVENOUS
  Administered 2011-10-06: 0.5 mg via INTRAVENOUS

## 2011-10-06 MED ORDER — GLYCOPYRROLATE 0.2 MG/ML IJ SOLN
INTRAMUSCULAR | Status: DC | PRN
  Administered 2011-10-06: .4 mg via INTRAVENOUS

## 2011-10-06 MED ORDER — PROPOFOL 10 MG/ML IV EMUL
INTRAVENOUS | Status: DC | PRN
  Administered 2011-10-06: 50 mg via INTRAVENOUS
  Administered 2011-10-06: 160 mg via INTRAVENOUS

## 2011-10-06 MED ORDER — NEOSTIGMINE METHYLSULFATE 1 MG/ML IJ SOLN
INTRAMUSCULAR | Status: DC | PRN
  Administered 2011-10-06: 3 mg via INTRAVENOUS

## 2011-10-06 MED ORDER — DIPHENHYDRAMINE HCL 50 MG/ML IJ SOLN
12.5000 mg | Freq: Four times a day (QID) | INTRAMUSCULAR | Status: DC | PRN
  Administered 2011-10-07: 12.5 mg via INTRAVENOUS
  Filled 2011-10-06: qty 1

## 2011-10-06 MED ORDER — DIPHENHYDRAMINE HCL 12.5 MG/5ML PO ELIX: 12.5000 mg | ORAL_SOLUTION | Freq: Four times a day (QID) | ORAL | Status: DC | PRN

## 2011-10-06 MED ORDER — PANTOPRAZOLE SODIUM 40 MG PO TBEC
40.0000 mg | DELAYED_RELEASE_TABLET | Freq: Every day | ORAL | Status: DC
  Administered 2011-10-06 – 2011-10-10 (×5): 40 mg via ORAL
  Filled 2011-10-06 (×6): qty 1

## 2011-10-06 MED ORDER — LIDOCAINE HCL (CARDIAC) 20 MG/ML IV SOLN
INTRAVENOUS | Status: DC | PRN
  Administered 2011-10-06: 30 mg via INTRAVENOUS

## 2011-10-06 MED ORDER — HYDROMORPHONE HCL PF 1 MG/ML IJ SOLN
0.5000 mg | INTRAMUSCULAR | Status: DC | PRN
  Administered 2011-10-06: 1 mg via INTRAVENOUS
  Administered 2011-10-06: 0.5 mg via INTRAVENOUS
  Administered 2011-10-07 – 2011-10-08 (×8): 1 mg via INTRAVENOUS
  Filled 2011-10-06 (×10): qty 1

## 2011-10-06 MED ORDER — SUCCINYLCHOLINE CHLORIDE 20 MG/ML IJ SOLN
INTRAMUSCULAR | Status: DC | PRN
  Administered 2011-10-06: 100 mg via INTRAVENOUS

## 2011-10-06 MED ORDER — BUPIVACAINE LIPOSOME 1.3 % IJ SUSP: 20.0000 mL | Freq: Once | INTRAMUSCULAR | Status: DC

## 2011-10-06 MED ORDER — BUPIVACAINE-EPINEPHRINE 0.25% -1:200000 IJ SOLN
INTRAMUSCULAR | Status: DC | PRN
  Administered 2011-10-06: 1 mL

## 2011-10-06 SURGICAL SUPPLY — 61 items
BAG ZIPLOCK 12X15 (MISCELLANEOUS) ×2 IMPLANT
BENZOIN TINCTURE PRP APPL 2/3 (GAUZE/BANDAGES/DRESSINGS) ×2 IMPLANT
BLADE EXTENDED COATED 6.5IN (ELECTRODE) IMPLANT
BLADE SURG SZ10 CARB STEEL (BLADE) ×2 IMPLANT
CANISTER SUCTION 2500CC (MISCELLANEOUS) ×2 IMPLANT
CHLORAPREP W/TINT 26ML (MISCELLANEOUS) ×2 IMPLANT
CLIP LIGATING HEM O LOK PURPLE (MISCELLANEOUS) ×4 IMPLANT
CLIP LIGATING HEMO LOK XL GOLD (MISCELLANEOUS) IMPLANT
CLIP LIGATING HEMO O LOK GREEN (MISCELLANEOUS) ×2 IMPLANT
CLOTH BEACON ORANGE TIMEOUT ST (SAFETY) ×2 IMPLANT
COVER SURGICAL LIGHT HANDLE (MISCELLANEOUS) ×2 IMPLANT
CUTTER FLEX LINEAR 45M (STAPLE) ×2 IMPLANT
DERMABOND ADVANCED (GAUZE/BANDAGES/DRESSINGS) ×1
DERMABOND ADVANCED .7 DNX12 (GAUZE/BANDAGES/DRESSINGS) ×1 IMPLANT
DRAIN CHANNEL 10F 3/8 F FF (DRAIN) IMPLANT
DRAPE CAMERA CLOSED 9X96 (DRAPES) ×2 IMPLANT
DRAPE INCISE IOBAN 66X45 STRL (DRAPES) ×2 IMPLANT
DRAPE LAPAROSCOPIC ABDOMINAL (DRAPES) ×2 IMPLANT
DRAPE WARM FLUID 44X44 (DRAPE) IMPLANT
DRSG TEGADERM 4X4.75 (GAUZE/BANDAGES/DRESSINGS) ×2 IMPLANT
ELECT REM PT RETURN 9FT ADLT (ELECTROSURGICAL) ×2
ELECTRODE REM PT RTRN 9FT ADLT (ELECTROSURGICAL) ×1 IMPLANT
EVACUATOR SILICONE 100CC (DRAIN) IMPLANT
FLOSEAL (HEMOSTASIS) IMPLANT
FLOSEAL 10ML (HEMOSTASIS) ×2 IMPLANT
GLOVE BIOGEL M STRL SZ7.5 (GLOVE) ×2 IMPLANT
GOWN STRL NON-REIN LRG LVL3 (GOWN DISPOSABLE) ×4 IMPLANT
HEMOSTAT SURGICEL 4X8 (HEMOSTASIS) IMPLANT
KIT BASIN OR (CUSTOM PROCEDURE TRAY) ×2 IMPLANT
PENCIL BUTTON HOLSTER BLD 10FT (ELECTRODE) ×2 IMPLANT
POSITIONER SURGICAL ARM (MISCELLANEOUS) ×4 IMPLANT
POUCH ENDO CATCH II 15MM (MISCELLANEOUS) ×2 IMPLANT
RELOAD 45 VASCULAR/THIN (ENDOMECHANICALS) ×2 IMPLANT
RETRACTOR LAPSCP 12X46 CVD (ENDOMECHANICALS) IMPLANT
RTRCTR LAPSCP 12X46 CVD (ENDOMECHANICALS)
SCALPEL HARMONIC ACE (MISCELLANEOUS) ×2 IMPLANT
SCISSORS LAP 5X35 DISP (ENDOMECHANICALS) IMPLANT
SET IRRIG TUBING LAPAROSCOPIC (IRRIGATION / IRRIGATOR) ×2 IMPLANT
SOLUTION ANTI FOG 6CC (MISCELLANEOUS) ×2 IMPLANT
SPONGE GAUZE 4X4 12PLY (GAUZE/BANDAGES/DRESSINGS) ×2 IMPLANT
SPONGE LAP 18X18 X RAY DECT (DISPOSABLE) ×2 IMPLANT
SPONGE LAP 4X18 X RAY DECT (DISPOSABLE) IMPLANT
SPONGE SURGIFOAM ABS GEL 100 (HEMOSTASIS) IMPLANT
STRIP CLOSURE SKIN 1/4X4 (GAUZE/BANDAGES/DRESSINGS) ×2 IMPLANT
SUT ETHILON 3 0 PS 1 (SUTURE) IMPLANT
SUT MNCRL AB 4-0 PS2 18 (SUTURE) ×4 IMPLANT
SUT PDS AB 1 CTX 36 (SUTURE) ×4 IMPLANT
SUT VIC AB 2-0 SH 27 (SUTURE) ×2
SUT VIC AB 2-0 SH 27X BRD (SUTURE) ×2 IMPLANT
SUT VICRYL 0 UR6 27IN ABS (SUTURE) ×8 IMPLANT
TAPE STRIPS DRAPE STRL (GAUZE/BANDAGES/DRESSINGS) ×2 IMPLANT
TOWEL OR 17X26 10 PK STRL BLUE (TOWEL DISPOSABLE) ×2 IMPLANT
TOWEL OR NON WOVEN STRL DISP B (DISPOSABLE) ×2 IMPLANT
TRAY FOLEY CATH 14FRSI W/METER (CATHETERS) ×2 IMPLANT
TRAY LAP CHOLE (CUSTOM PROCEDURE TRAY) ×2 IMPLANT
TROCAR BLADELESS OPT 5 100 (ENDOMECHANICALS) ×2 IMPLANT
TROCAR BLADELESS OPT 5 75 (ENDOMECHANICALS) ×2 IMPLANT
TROCAR XCEL 12X100 BLDLESS (ENDOMECHANICALS) ×2 IMPLANT
TROCAR XCEL BLUNT TIP 100MML (ENDOMECHANICALS) ×2 IMPLANT
TUBING INSUFFLATION 10FT LAP (TUBING) ×2 IMPLANT
YANKAUER SUCT BULB TIP 10FT TU (MISCELLANEOUS) ×2 IMPLANT

## 2011-10-06 NOTE — Progress Notes (Signed)
HGB. AND HCT. AND B MET RESULTS NOTED. 

## 2011-10-06 NOTE — Anesthesia Preprocedure Evaluation (Addendum)
Anesthesia Evaluation  Patient identified by MRN, date of birth, ID band Patient awake    Reviewed: Allergy & Precautions, H&P , NPO status , Patient's Chart, lab work & pertinent test results, reviewed documented beta blocker date and time   Airway Mallampati: II TM Distance: >3 FB Neck ROM: Full    Dental  (+) Partial Upper, Poor Dentition and Dental Advisory Given   Pulmonary neg pulmonary ROS,  clear to auscultation        Cardiovascular hypertension, Pt. on medications Regular Normal Denies cardiac symptoms   Neuro/Psych Negative Neurological ROS  Negative Psych ROS   GI/Hepatic negative GI ROS, Left Renal Lesion   Endo/Other  Negative Endocrine ROS  Renal/GU negative Renal ROS  Genitourinary negative   Musculoskeletal negative musculoskeletal ROS (+)   Abdominal   Peds negative pediatric ROS (+)  Hematology negative hematology ROS (+)   Anesthesia Other Findings   Reproductive/Obstetrics negative OB ROS                           Anesthesia Physical Anesthesia Plan  ASA: III  Anesthesia Plan: General   Post-op Pain Management:    Induction: Intravenous  Airway Management Planned: Oral ETT  Additional Equipment:   Intra-op Plan:   Post-operative Plan: Extubation in OR  Informed Consent: I have reviewed the patients History and Physical, chart, labs and discussed the procedure including the risks, benefits and alternatives for the proposed anesthesia with the patient or authorized representative who has indicated his/her understanding and acceptance.   Dental advisory given  Plan Discussed with: CRNA and Surgeon  Anesthesia Plan Comments:         Anesthesia Quick Evaluation

## 2011-10-06 NOTE — Transfer of Care (Signed)
Immediate Anesthesia Transfer of Care Note  Patient: Wendy Erickson  Procedure(s) Performed: Procedure(s) (LRB): LAPAROSCOPIC NEPHRECTOMY (Left)  Patient Location: PACU  Anesthesia Type: General  Level of Consciousness: awake and sedated  Airway & Oxygen Therapy: Patient Spontanous Breathing and Patient connected to face mask oxygen  Post-op Assessment: Report given to PACU RN and Post -op Vital signs reviewed and stable  Post vital signs: Reviewed and stable  Complications: No apparent anesthesia complications

## 2011-10-06 NOTE — Interval H&P Note (Signed)
History and Physical Interval Note:  10/06/2011 10:25 AM  Wendy Erickson  has presented today for surgery, with the diagnosis of Left Renal Neoplasm  The various methods of treatment have been discussed with the patient and family. After consideration of risks, benefits and other options for treatment, the patient has consented to  Procedure(s) (LRB): LAPAROSCOPIC NEPHRECTOMY (Left) as a surgical intervention .  The patients' history has been reviewed, patient examined, no change in status, stable for surgery.  I have reviewed the patients' chart and labs.  Questions were answered to the patient's satisfaction.     Reiss Mowrey,LES

## 2011-10-06 NOTE — Progress Notes (Signed)
Hgb. And Hct. And B met drawn by lab. 

## 2011-10-06 NOTE — Progress Notes (Signed)
Dr. Shireen Quan in- made aware of patient's blood pressures in PACU

## 2011-10-06 NOTE — Discharge Instructions (Signed)

## 2011-10-06 NOTE — Op Note (Signed)
Preoperative diagnosis: Left renal neoplasm  Postoperative diagnosis: Left renal neoplasm  Procedure: 1.  Left laparoscopic radical nephrectomy  Surgeon: Moody Bruins. M.D.  Assistant(s): Pecola Leisure, PA-C  Anesthesia: General  Complications: None  EBL: <25 mL  IVF:  1800 mL crystalloid  Specimens: 1. Left kidney  Disposition of specimens: Pathology  Indication: Wendy Erickson is a 71 y.o. patient with a left renal tumor suspicious for malignancy.  After a thorough review of the management options for their renal mass, they elected to proceed with surgical treatment and the above procedure.  We have discussed the potential benefits and risks of the procedure, side effects of the proposed treatment, the likelihood of the patient achieving the goals of the procedure, and any potential problems that might occur during the procedure or recuperation. Informed consent has been obtained.  Description of procedure:  The patient was taken to the operating room and a general anesthetic was administered. The patient was given preoperative antibiotics, placed in the right modified flank position, and prepped and draped in the usual sterile fashion. Next a preoperative timeout was performed.  A site was selected near the umbilicus for placement of the camera port. This was placed using a standard open Hassan technique which allowed entry into the peritoneal cavity under direct vision and without difficulty. A 12 mm Hassan cannula was placed and a pneumoperitoneum established. The camera was then used to inspect the abdomen and there was no evidence of any intra-abdominal injuries or other abnormalities. The remaining abdominal ports were then placed. A 12 mm port was placed in the left lower quadrant and a 5 mm port was placed in the left upper quadrant.  All ports were placed under direct vision without difficulty.  Utilizing the harmonic scalpel, the white line of Toldt was incised  allowing the colon to be reflected medially and the plane between the mesocolon and the anterior layer of Gerota's fascia to be developed and the kidney exposed.  The ureter and gonadal vein were identified inferiorly and the ureter was lifted anteriorly off the psoas muscle.  Dissection proceeded superiorly along the gonadal vein until the renal vein was identified.  The renal hilum was then carefully isolated with a combination of blunt and sharp dissectiong allowing the renal arterial and venous structures to be separated and isolated.   2 renal arteries were identified and isolated and ligated with multiple Weck clips and subsequently divided.  The renal vein was then isolated and also ligated and divided with a 45 mm Flex ETS stapler.  Gerota's fascia was intentionally entered superiorly and the space between the adrenal gland and the kidney was developed allowing the adrenal gland to be spared.  The splenorenal ligaments were divided with the harmonic scalpel.  The lateral and posterior attachements to the kidney were then divided.  The ureter was ligated with Weck clips and divided allowing the specimen to be freed from all surrounding structures.  The kidney specimen was then placed into a 15 mm Endocatch II retrieval bag.  The renal hilum, liver, adrenal bed and gonadal vein areas were each inspected and hemostasis was ensured with the pneomperitoneal pressures lowered.  The 12 mm lower quadrant port was then closed with a 0-vicryl suture placed laparoscopically to close the fascia of this incision. All remaining ports were removed under direct vision.  The kidney specimen was removed intact within the retrieval bag via the camera port site after this incision was extended slightly. This fascial opening was  then closed with two #1 PDS sutures.    All incisions were injected with local anesthetic and reapproximated at the skin with 4-0 monocryl sutures.  Dermabond was applied to the skin. The patient  tolerated the procedure well and without complications and was transferred to the recovery unit in satisfactory condition.   Moody Bruins MD

## 2011-10-06 NOTE — Anesthesia Postprocedure Evaluation (Signed)
  Anesthesia Post-op Note  Patient: Wendy Erickson  Procedure(s) Performed: Procedure(s) (LRB): LAPAROSCOPIC NEPHRECTOMY (Left)  Patient Location: PACU  Anesthesia Type: General  Level of Consciousness: oriented and sedated  Airway and Oxygen Therapy: Patient Spontanous Breathing, Patient connected to nasal cannula oxygen, Patient connected to T-piece oxygen and aerosol face mask  Post-op Pain: mild  Post-op Assessment: Post-op Vital signs reviewed, Patient's Cardiovascular Status Stable, Respiratory Function Stable and Patent Airway  Post-op Vital Signs: stable  Complications: No apparent anesthesia complications

## 2011-10-07 ENCOUNTER — Encounter (HOSPITAL_COMMUNITY): Payer: Self-pay | Admitting: Urology

## 2011-10-07 LAB — BASIC METABOLIC PANEL
BUN: 14 mg/dL (ref 6–23)
Calcium: 8.1 mg/dL — ABNORMAL LOW (ref 8.4–10.5)
GFR calc Af Amer: 51 mL/min — ABNORMAL LOW (ref >90)
GFR calc non Af Amer: 44 mL/min — ABNORMAL LOW (ref >90)
Glucose, Bld: 140 mg/dL — ABNORMAL HIGH (ref 70–99)
Potassium: 4.9 mEq/L (ref 3.5–5.1)
Sodium: 133 mEq/L — ABNORMAL LOW (ref 135–145)

## 2011-10-07 MED ORDER — DOCUSATE SODIUM 100 MG PO CAPS
100.0000 mg | ORAL_CAPSULE | Freq: Two times a day (BID) | ORAL | Status: DC
  Administered 2011-10-07 – 2011-10-10 (×6): 100 mg via ORAL
  Filled 2011-10-07 (×9): qty 1

## 2011-10-07 MED ORDER — HYDROCODONE-ACETAMINOPHEN 5-325 MG PO TABS
1.0000 | ORAL_TABLET | Freq: Four times a day (QID) | ORAL | Status: DC | PRN
  Administered 2011-10-07 – 2011-10-10 (×9): 2 via ORAL
  Administered 2011-10-10: 1 via ORAL
  Filled 2011-10-07 (×9): qty 2
  Filled 2011-10-07: qty 1

## 2011-10-07 MED ORDER — BISACODYL 10 MG RE SUPP
10.0000 mg | Freq: Once | RECTAL | Status: AC
  Administered 2011-10-07: 10 mg via RECTAL
  Filled 2011-10-07: qty 1

## 2011-10-07 MED ORDER — ENOXAPARIN SODIUM 40 MG/0.4ML ~~LOC~~ SOLN
40.0000 mg | SUBCUTANEOUS | Status: DC
  Administered 2011-10-07 – 2011-10-09 (×3): 40 mg via SUBCUTANEOUS
  Filled 2011-10-07 (×4): qty 0.4

## 2011-10-07 NOTE — Progress Notes (Signed)
Patient ID: Marlon Pel, female   DOB: [birth year], 71 y.o.   MRN: [id number]  1 Day Post-Op Subjective: The patient is doing well.  No nausea or vomiting. Pain is adequately controlled. She did not get out of bed last night.  Objective: Vital signs in last 24 hours: Temp:  [97.6 F (36.4 C)-98.5 F (36.9 C)] 98.2 F (36.8 C) (03/01 0549) Pulse Rate:  [59-82] 59  (03/01 0549) Resp:  [11-20] 16  (03/01 0549) BP: (117-201)/(43-73) 131/67 mmHg (03/01 0549) SpO2:  [89 %-99 %] 96 % (03/01 0549) Weight:  [71.714 kg (158 lb 1.6 oz)] 71.714 kg (158 lb 1.6 oz) (02/28 1620)  Intake/Output from previous day: 02/28 0701 - 03/01 0700 In: 4963 [P.O.:1453; I.V.:3360; IV Piggyback:150] Out: 555 [Urine:550; Blood:5] Intake/Output this shift:    Physical Exam:  General: Alert and oriented. CV: RRR Lungs: Clear bilaterally. GI: Soft, Nondistended. Incisions: Clean and dry. Urine: Clear Extremities: Nontender, no erythema, no edema.  Lab Results:  Basename 10/07/11 0453 10/06/11 1331  HGB 11.4* 13.0  HCT 34.5* 38.4          Basename 10/07/11 0453 10/06/11 1331 09/30/11 1520  CREATININE 1.23* 0.85 0.75           Results for orders placed during the hospital encounter of 10/06/11 (from the past 24 hour(s))  BASIC METABOLIC PANEL     Status: Abnormal   Collection Time   10/06/11  1:31 PM      Component Value Range   Sodium 136  135 - 145 (mEq/L)   Potassium 3.7  3.5 - 5.1 (mEq/L)   Chloride 102  96 - 112 (mEq/L)   CO2 24  19 - 32 (mEq/L)   Glucose, Bld 149 (*) 70 - 99 (mg/dL)   BUN 17  6 - 23 (mg/dL)   Creatinine, Ser 4.09  0.50 - 1.10 (mg/dL)   Calcium 8.4  8.4 - 81.1 (mg/dL)   GFR calc non Af Amer 69 (*) >90 (mL/min)   GFR calc Af Amer 80 (*) >90 (mL/min)  HEMOGLOBIN AND HEMATOCRIT, BLOOD     Status: Normal   Collection Time   10/06/11  1:31 PM      Component Value Range   Hemoglobin 13.0  12.0 - 15.0 (g/dL)   HCT 91.4  78.2 - 95.6 (%)  BASIC METABOLIC PANEL     Status:  Abnormal   Collection Time   10/07/11  4:53 AM      Component Value Range   Sodium 133 (*) 135 - 145 (mEq/L)   Potassium 4.9  3.5 - 5.1 (mEq/L)   Chloride 102  96 - 112 (mEq/L)   CO2 23  19 - 32 (mEq/L)   Glucose, Bld 140 (*) 70 - 99 (mg/dL)   BUN 14  6 - 23 (mg/dL)   Creatinine, Ser 2.13 (*) 0.50 - 1.10 (mg/dL)   Calcium 8.1 (*) 8.4 - 10.5 (mg/dL)   GFR calc non Af Amer 44 (*) >90 (mL/min)   GFR calc Af Amer 51 (*) >90 (mL/min)  HEMOGLOBIN AND HEMATOCRIT, BLOOD     Status: Abnormal   Collection Time   10/07/11  4:53 AM      Component Value Range   Hemoglobin 11.4 (*) 12.0 - 15.0 (g/dL)   HCT 08.6 (*) 57.8 - 46.0 (%)    Assessment/Plan: POD# 1 s/p laparoscopic nephrectomy.  1) Ambulate (will get OOB to chair and have PT work with her -she uses a walker at baseline and will  need to be assessed for postoperative needs for PT/care), Incentive spirometry 2) Advance diet as tolerated 3) Transition to oral pain medication 4) Dulcolax suppository 5) D/C urethral catheter once UOP improves.  Continue hydration for now.   Rolly Salter, Montez Hageman. MD   LOS: 1 day   Coyle Stordahl,LES 10/07/2011, 7:21 AM

## 2011-10-07 NOTE — Evaluation (Signed)
Physical Therapy Evaluation Patient Details Name: Wendy Erickson MRN: [id number] DOB: [birth year] Today's Date: 10/07/2011  Problem List: There is no problem list on file for this patient.   Past Medical History:  Past Medical History  Diagnosis Date  . Hypertension   . Chronic kidney disease     left renla neoplasm  . Ataxia   . Depression   . Cancer     left renal neoplasm   Past Surgical History:  Past Surgical History  Procedure Date  . Tonsillectomy   . Abdominal hysterectomy   . Knee arthroscopy     bilateral   . Dilation and curettage of uterus   . Back surgery     PT Assessment/Plan/Recommendation PT Assessment Clinical Impression Statement: Pt presents s/p lap radical nephrectomy. Pt living at [facility]. Plans to D/C to "[facility]" at [facility]. Pt will benefit from skilled PT in acute setting to improve general strength, activity tolerance, balance, and gait in preparation for D/C to SNF for continued rehab. PT Recommendation/Assessment: Patient will need skilled PT in the acute care venue PT Problem List: Decreased strength;Decreased activity tolerance;Decreased balance;Decreased mobility;Decreased safety awareness;Pain;Decreased knowledge of use of DME Barriers to Discharge: Decreased caregiver support PT Therapy Diagnosis : Difficulty walking;Abnormality of gait;Generalized weakness;Acute pain PT Plan PT Frequency: Min 3X/week PT Treatment/Interventions: DME instruction;Gait training;Functional mobility training;Therapeutic activities;Therapeutic exercise;Balance training;Patient/family education PT Recommendation Recommendations for Other Services: OT consult Follow Up Recommendations: Skilled nursing facility Equipment Recommended: Defer to next venue PT Goals  Acute Rehab PT Goals PT Goal Formulation: With patient Time For Goal Achievement: 2 weeks Pt will go Supine/Side to Sit: with supervision PT Goal: Supine/Side to Sit -  Progress: Goal set today Pt will go Sit to Supine/Side: with supervision PT Goal: Sit to Supine/Side - Progress: Goal set today Pt will go Sit to Stand: with supervision PT Goal: Sit to Stand - Progress: Goal set today Pt will go Stand to Sit: with supervision PT Goal: Stand to Sit - Progress: Goal set today Pt will Transfer Bed to Chair/Chair to Bed: with supervision PT Transfer Goal: Bed to Chair/Chair to Bed - Progress: Goal set today Pt will Ambulate: with supervision;with least restrictive assistive device;51 - 150 feet PT Goal: Ambulate - Progress: Goal set today  PT Evaluation Precautions/Restrictions  Precautions Precautions: Fall Prior Functioning  Home Living Lives With: Alone Type of Home: Independent living facility [facility]) Home Layout: One level Home Access: Level entry Home Adaptive Equipment: Walker - four wheeled Prior Function Level of Independence: Requires assistive device for independence;Independent with basic ADLs;Needs assistance with homemaking Cognition Cognition Arousal/Alertness: Awake/alert Overall Cognitive Status: Appears within functional limits for tasks assessed Orientation Level: Oriented X4 Sensation/Coordination Sensation Light Touch: Appears Intact Coordination Gross Motor Movements are Fluid and Coordinated: Yes Extremity Assessment   Mobility (including Balance) Bed Mobility Bed Mobility: Yes Supine to Sit: 3: Mod assist;HOB elevated (Comment degrees);With rails Supine to Sit Details (indicate cue type and reason): VCs safety, technique, hand placement. Assist for trunk to upright. Increased time Transfers Transfers: Yes Sit to Stand: 3: Mod assist;From elevated surface;From bed;With upper extremity assist Sit to Stand Details (indicate cue type and reason): Assist to rise, stabilize. Wide BOS. VCS safety, technique, hand placement Stand to Sit: 3: Mod assist;With upper extremity assist;With armrests;To chair/3-in-1 Stand to  Sit Details: Assist to control descent. VCs safety, technique, hand placement.  Stand Pivot Transfers: 3: Mod assist Stand Pivot Transfer Details (indicate cue type and reason): with RW. VCs  safety. assist to stabilize and maneuver with RW.  Ambulation/Gait Ambulation/Gait: Yes Ambulation/Gait Assistance: 3: Mod assist Ambulation/Gait Assistance Details (indicate cue type and reason): Assist to stabilize and maneuver with RW. Very unsteady. Followed with recliner. VCs safety, posture. Pt c/o dizziness-deferred further ambulation Ambulation Distance (Feet): 5 Feet Assistive device: Rolling walker Gait Pattern: Step-through pattern;Decreased step length - right;Decreased step length - left;Decreased stride length    Exercise    End of Session PT - End of Session Equipment Utilized During Treatment: Gait belt Activity Tolerance: Patient limited by fatigue;Patient limited by pain Patient left: in chair;with call bell in reach General Behavior During Session: [facility] for tasks performed Cognition: [facility] for tasks performed  Rebeca Alert [facility] 10/07/2011, 11:55 AM [phone]

## 2011-10-07 NOTE — Progress Notes (Signed)
O2 sats on pt without O2 were 86-88%. O2 replaced and O2 sats were in the 90s. Will continue to monitor. Julio Sicks RN

## 2011-10-08 LAB — BASIC METABOLIC PANEL
BUN: 12 mg/dL (ref 6–23)
CO2: 27 mEq/L (ref 19–32)
Calcium: 8.6 mg/dL (ref 8.4–10.5)
Creatinine, Ser: 1.33 mg/dL — ABNORMAL HIGH (ref 0.50–1.10)
GFR calc non Af Amer: 40 mL/min — ABNORMAL LOW (ref >90)
Glucose, Bld: 103 mg/dL — ABNORMAL HIGH (ref 70–99)
Sodium: 137 mEq/L (ref 135–145)

## 2011-10-08 MED ORDER — AMLODIPINE BESYLATE 5 MG PO TABS
5.0000 mg | ORAL_TABLET | Freq: Every day | ORAL | Status: DC
  Administered 2011-10-08 – 2011-10-10 (×3): 5 mg via ORAL
  Filled 2011-10-08 (×4): qty 1

## 2011-10-08 MED ORDER — BISACODYL 10 MG RE SUPP
10.0000 mg | Freq: Two times a day (BID) | RECTAL | Status: DC
  Administered 2011-10-08 – 2011-10-09 (×3): 10 mg via RECTAL
  Filled 2011-10-08 (×4): qty 1

## 2011-10-08 NOTE — Progress Notes (Signed)
Urology Progress Note  Subjective:     No acute urologic events overnight. Voiding without difficulty. Ambulation:  [ x ] Positive-limited  [  ] Negative Flatus:   [  ] Positive  [ x ] Negative Bowel movement [  ] Positive  [ x ] Negative  Pain: [ x ] Well controlled.  [  ] Needs adjustment.  Objective:  Patient Vitals for the past 24 hrs:  BP Temp Temp src Pulse Resp SpO2  10/08/11 0421 151/75 mmHg 98.2 F (36.8 C) Oral 56  16  95 %  10/08/11 0309 140/80 mmHg - - - - -  10/08/11 0210 188/82 mmHg - - - - -  10/08/11 0206 192/73 mmHg 98.1 F (36.7 C) Oral 65  20  93 %  10/07/11 2216 147/70 mmHg 98.6 F (37 C) Oral 64  16  93 %  10/07/11 2113 162/82 mmHg - - - - -  10/07/11 1901 - - - - - 85 %  10/07/11 1900 125/66 mmHg 99.4 F (37.4 C) - 64  15  95 %  10/07/11 1435 124/68 mmHg 98.2 F (36.8 C) - 62  15  97 %  10/07/11 1009 125/65 mmHg 98.4 F (36.9 C) Oral 61  15  96 %    Physical Exam: General:  No acute distress, awake Cardiovascular:    [x]   S1/S2 present, RRR  []   Irregularly irregular Chest:  CTA-B Abdomen:               []  Soft, appropriately TTP  []  Soft, NTTP  [x]  Soft, appropriately TTP, incision(s) clean/dry/intact  Genitourinary: No foley    I/O last 3 completed shifts: In: 2655 [I.V.:2555; IV Piggyback:100] Out: 2725 [Urine:2725]  Recent Labs  [facility] 10/08/11 0452 10/07/11 0453   HGB 10.7* 11.4*   WBC -- --   PLT -- --    Recent Labs  Basename 10/08/11 0452 10/07/11 0453   NA 137 133*   K 3.9 4.9   CL 105 102   CO2 27 23   BUN 12 14   CREATININE 1.33* 1.23*   CALCIUM 8.6 8.1*   GFRNONAA 40* 44*   GFRAA 46* 51*     No results found for this basename: PT:2,INR:2,APTT:2 in the last 72 hours   No components found with this basename: ABG:2     Assessment: Renal cell carcinoma POD#2 Left lap nephrectomy Plan: -Shared results of good news of pathology with patient. -Dulcolax BID -Encourage ambulation -Increase norvasc to  5mg  -Hemoglobin stable; will check BMP tomorrow. -Disposition: remain inpatient   Natalia Leatherwood, MD [phone]

## 2011-10-09 LAB — BASIC METABOLIC PANEL
GFR calc Af Amer: 53 mL/min — ABNORMAL LOW (ref >90)
GFR calc non Af Amer: 45 mL/min — ABNORMAL LOW (ref >90)
Potassium: 3.6 mEq/L (ref 3.5–5.1)
Sodium: 137 mEq/L (ref 135–145)

## 2011-10-09 MED ORDER — VITAMINS A & D EX OINT
TOPICAL_OINTMENT | CUTANEOUS | Status: AC
  Administered 2011-10-09: 18:00:00
  Filled 2011-10-09: qty 5

## 2011-10-09 NOTE — Progress Notes (Signed)
Urology Progress Note  Subjective:     No acute urologic events overnight. Voiding without difficulty. Tolerating regular diet. Ambulation:  [ x ] Positive-limited  [  ] Negative Flatus:   [  ] Positive  [ x ] Negative Bowel movement [  ] Positive  [ x ] Negative  Pain: [ x ] Well controlled.  [  ] Needs adjustment.  Objective:  Patient Vitals for the past 24 hrs:  BP Temp Temp src Pulse Resp SpO2  10/09/11 0510 141/67 mmHg 98.6 F (37 C) Oral 65  18  97 %  10/08/11 2011 165/80 mmHg 98.3 F (36.8 C) Oral 66  17  96 %  10/08/11 1729 157/75 mmHg 98.7 F (37.1 C) Oral 69  20  96 %  10/08/11 1354 155/78 mmHg 98.4 F (36.9 C) Oral 60  19  99 %  10/08/11 1111 152/72 mmHg 98 F (36.7 C) Oral 69  18  91 %    Physical Exam: General:  No acute distress, awake Cardiovascular:    [x]   S1/S2 present, RRR  []   Irregularly irregular Chest:  CTA-B Abdomen:               []  Soft, appropriately TTP  []  Soft, NTTP  [x]  Soft, appropriately TTP, incision(s) clean/dry/intact  Genitourinary: No foley    I/O last 3 completed shifts: In: 360 [P.O.:360] Out: 1300 [Urine:1300]  Recent Labs  Basename 10/08/11 0452 10/07/11 0453   HGB 10.7* 11.4*   WBC -- --   PLT -- --    Recent Labs  Basename 10/09/11 0507 10/08/11 0452   NA 137 137   K 3.6 3.9   CL 104 105   CO2 26 27   BUN 19 12   CREATININE 1.20* 1.33*   CALCIUM 8.9 8.6   GFRNONAA 45* 40*   GFRAA 53* 46*     No results found for this basename: PT:2,INR:2,APTT:2 in the last 72 hours   No components found with this basename: ABG:2     Assessment: Renal cell carcinoma POD#3 Left lap nephrectomy Plan: -Await Bowel function. -Continue dulcolax BID -Continue ambulation -Renal function improving; check BMP tomorrow. -Disposition: remain inpatient   Natalia Leatherwood, MD [phone]

## 2011-10-09 NOTE — Progress Notes (Signed)
Patient seen and consult received for SNF placement at dc.  Patient admitted from independent ALF [facility].  Patient and facility both report she will need higher level of care for rehab/ short term.  Patient agreeable.  Completed psychosocial assessment and placed in shadow chart for review.  FL2 also completed: MD please sign. Insurance for [employer] was also completed and requested auth for ?anticipated dc on 3/4.    Will have weekday CSW follow up.  Ashley Jacobs, MSW LCSW [phone] Weekend coverage [phone]

## 2011-10-09 NOTE — Evaluation (Signed)
Occupational Therapy Evaluation Patient Details Name: Wendy Erickson MRN: [id number] DOB: 09-06-1940 Today's Date: 10/09/2011  Problem List: There is no problem list on file for this patient.   Past Medical History:  Past Medical History  Diagnosis Date  . Hypertension   . Chronic kidney disease     left renla neoplasm  . Ataxia   . Depression   . Cancer     left renal neoplasm   Past Surgical History:  Past Surgical History  Procedure Date  . Tonsillectomy   . Abdominal hysterectomy   . Knee arthroscopy     bilateral   . Dilation and curettage of uterus   . Back surgery   . Laparoscopic nephrectomy 10/06/2011    Procedure: LAPAROSCOPIC NEPHRECTOMY;  Surgeon: Crecencio Mc, MD;  Location: [facility];  Service: Urology;  Laterality: Left;  LEFT LAPAROSCOPIC RADICAL NEPHRECTOMY      OT Assessment/Plan/Recommendation OT Assessment Clinical Impression Statement: 71 yo female s/p  lap radical nephrectomy that could benefit from skilled Ot. Pt with decreased activity tolearnce and balance deficitis. OT Recommendation/Assessment: Patient will need skilled OT in the acute care venue OT Problem List: Decreased strength;Decreased activity tolerance;Impaired balance (sitting and/or standing);Decreased knowledge of use of DME or AE;Decreased safety awareness;Decreased knowledge of precautions;Pain OT Therapy Diagnosis : Generalized weakness;Acute pain OT Plan OT Frequency: Min 2X/week OT Treatment/Interventions: Self-care/ADL training;Therapeutic activities;Patient/family education;Balance training OT Recommendation Follow Up Recommendations: Skilled nursing facility Equipment Recommended: Defer to next venue Individuals Consulted Consulted and Agree with Results and Recommendations: Patient OT Goals Acute Rehab OT Goals OT Goal Formulation: With patient Time For Goal Achievement: 2 weeks ADL Goals Pt Will Perform Grooming: with set-up;Sitting at sink ADL Goal: Grooming - Progress: Goal  set today Pt Will Perform Upper Body Bathing: with set-up;Standing at sink ADL Goal: Upper Body Bathing - Progress: Goal set today Pt Will Perform Lower Body Bathing: with set-up;Standing at sink ADL Goal: Lower Body Bathing - Progress: Goal set today Pt Will Perform Upper Body Dressing: with set-up;Sit to stand from bed;Sit to stand from chair ADL Goal: Upper Body Dressing - Progress: Goal set today Pt Will Perform Lower Body Dressing: with set-up;Sit to stand from chair;Sit to stand from bed ADL Goal: Lower Body Dressing - Progress: Goal set today Pt Will Transfer to Toilet: with set-up;3-in-1 ADL Goal: Toilet Transfer - Progress: Goal set today Pt Will Perform Toileting - Clothing Manipulation: with set-up;Sitting on 3-in-1 or toilet ADL Goal: Toileting - Clothing Manipulation - Progress: Goal set today Pt Will Perform Toileting - Hygiene: with set-up;Sit to stand from 3-in-1/toilet ADL Goal: Toileting - Hygiene - Progress: Goal set today  OT Evaluation Precautions/Restrictions  Precautions Precautions: Fall Required Braces or Orthoses: No Restrictions Weight Bearing Restrictions: No Prior Functioning Home Living Lives With: Alone Type of Home: Independent living facility Home Layout: One level Home Access: Level entry Bathroom Shower/Tub: Walk-in shower;Curtain Bathroom Toilet: Handicapped height Bathroom Accessibility: Yes How Accessible: Accessible via walker;Accessible via wheelchair Home Adaptive Equipment: Dan Humphreys - four wheeled Additional Comments: Pt has seat in shower Prior Function Level of Independence: Requires assistive device for independence;Independent with basic ADLs;Needs assistance with homemaking ADL ADL Eating/Feeding: Performed;Set up Where Assessed - Eating/Feeding: Chair Grooming: Performed;Wash/dry hands;Supervision/safety Where Assessed - Grooming: Standing at [employer] Transfer: Performed;Moderate assistance Toilet Transfer Details (indicate  cue type and reason): pt using grab bar on Lt side and trying to hold on to the rollator walker. Pt provided mod v/c for safety. Pt voiding bladder.  Statistician  Method: Ambulating Toilet Transfer Equipment: Regular height toilet;Grab bars Toileting - Clothing Manipulation: Performed;Minimal assistance Where Assessed - Toileting Clothing Manipulation: Sit to stand from 3-in-1 or toilet Toileting - Hygiene: Performed;Set up Where Assessed - Toileting Hygiene: Sit to stand from 3-in-1 or toilet Equipment Used: Rolling walker (personal rollator walker used) Ambulation Related to ADLs: Pt ambulated ~75 feet with Min (A) and multiple LOB posteriorly with Mod (A) to correct. Pt with ataxic gait. Pt with decreased gait velocity indicating high fall risk ADL Comments: Pt requires Total (A) for all LB adls due to abdominal pain. Pt could benefit from OT acutely due to ataxia affecting basic transfers. (pt ambulating on RA 92 % O2 saturation during this session) Vision/Perception  Vision - History Baseline Vision: Wears glasses all the time Patient Visual Report: No change from baseline Cognition Cognition Arousal/Alertness: Awake/alert Overall Cognitive Status: Appears within functional limits for tasks assessed Orientation Level: Oriented X4 Sensation/Coordination Sensation Additional Comments: Reports numbness in Rt hand since surg.  Coordination Gross Motor Movements are Fluid and Coordinated: Yes Extremity Assessment RUE Assessment RUE Assessment: Within Functional Limits LUE Assessment LUE Assessment: Within Functional Limits Mobility  Bed Mobility Bed Mobility: No Transfers Transfers: Yes Sit to Stand: 3: Mod assist;From elevated surface;From chair/3-in-1;With upper extremity assist Sit to Stand Details (indicate cue type and reason): pt with posterior LOB and bracing LE against chair edge to compensate. Stand to Sit: 3: Mod assist;With upper extremity assist;To elevated  surface;To chair/3-in-1 Stand to Sit Details: (A) to control descend to chair Exercises   End of Session OT - End of Session Equipment Utilized During Treatment: Gait belt Activity Tolerance: Patient tolerated treatment well Patient left: in chair;with call bell in reach Nurse Communication: Mobility status for transfers General Behavior During Session: [facility] for tasks performed Cognition: [facility] for tasks performed   Lucile Shutters 10/09/2011, 11:45 AM  Pager: [phone]

## 2011-10-10 LAB — BASIC METABOLIC PANEL
Chloride: 100 mEq/L (ref 96–112)
GFR calc Af Amer: 53 mL/min — ABNORMAL LOW (ref >90)
Potassium: 3.6 mEq/L (ref 3.5–5.1)
Sodium: 136 mEq/L (ref 135–145)

## 2011-10-10 NOTE — Progress Notes (Signed)
Occupational Therapy Treatment Patient Details Name: Wendy Erickson MRN: [id number] DOB: [birth year] Today's Date: 10/10/2011  OT Assessment/Plan OT Assessment/Plan OT Frequency: Min 2X/week Follow Up Recommendations: Skilled nursing facility Equipment Recommended: Defer to next venue OT Goals ADL Goals Pt Will Perform Grooming: with set-up;Sitting at sink ADL Goal: Grooming - Progress: Progressing toward goals Pt Will Perform Upper Body Dressing: with set-up;Sit to stand from bed;Sit to stand from chair ADL Goal: Upper Body Dressing - Progress: Partly met--sitting Pt Will Perform Lower Body Dressing: with set-up;Sit to stand from chair;Sit to stand from bed ADL Goal: Lower Body Dressing - Progress: Progressing toward goals Pt Will Transfer to Toilet: with set-up;3-in-1 ADL Goal: Toilet Transfer - Progress: Progressing toward goals Pt Will Perform Toileting - Clothing Manipulation: with set-up;Sitting on 3-in-1 or toilet ADL Goal: Toileting - Clothing Manipulation - Progress: Progressing toward goals Pt Will Perform Toileting - Hygiene: with set-up;Sit to stand from 3-in-1/toilet ADL Goal: Toileting - Hygiene - Progress: Partly met  OT Treatment Precautions/Restrictions  Precautions Precautions: Fall Restrictions Weight Bearing Restrictions: No   ADL ADL Grooming: Performed;Wash/dry hands;Minimal assistance;Other (comment) (min guard); stand at sink Upper Body Dressing: Performed;Set up Where Assessed - Upper Body Dressing: Sitting, chair;Other (comment) (commode) Lower Body Dressing: Performed;Minimal assistance Where Assessed - Lower Body Dressing: Sit to stand from chair (from comfort height commode) with mod A to stand from this surface Toilet Transfer: Performed;Moderate assistance for sit to stand only:  Min A ambulating to commode.  Toilet Transfer Method: Proofreader: Comfort height toilet;Grab bars Toileting - Clothing Manipulation:  Performed;Minimal assistance except mod A for sit to stand from commode Where Assessed - Toileting Clothing Manipulation: Sit to stand from 3-in-1 or toilet Toileting - Hygiene: Performed;Set up Where Assessed - Toileting Hygiene: Sit on 3-in-1 or toilet Equipment Used: Other (comment) (4 wheel walker) Ambulation Related to ADLs: Ambulated to bathroom with min A, 4 wheel walker.  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 Mobility  Bed Mobility Bed Mobility: No (pt up in recliner) Transfers Transfers: Yes Sit to Stand: 4: Min assist;3: Mod assist;From chair/3-in-1;With armrests (min from recliner; mod from comfort height commode) Stand to Sit: 4: Min assist;To chair/3-in-1  Exercises    End of Session OT - End of Session Equipment Utilized During Treatment: Gait belt Activity Tolerance: Patient tolerated treatment well Patient left: in chair;with call bell in reach General Behavior During Session: [facility] for tasks performed Cognition: [facility] for tasks performed [facility], OTR/L [phone] 10/10/2011 Wendy Erickson  10/10/2011, 4:20 PM

## 2011-10-10 NOTE — Progress Notes (Signed)
Pt to be d/c to [facility] via [facility] today for SNF placement. [employer] has provided prior approval for SNF and Ambulance transport.

## 2011-10-10 NOTE — Progress Notes (Signed)
CSW assisting with d/c planning. [facility] has a SNF bed available for pt . Pt has [employer] and requires prior approval for SNF placement. All clinicals have been faxed to insurance. Awaiting auth. # for SNF. Will contact MD/SNF when auth provided. Will follow to assist with d/c planning.

## 2011-10-10 NOTE — Discharge Summary (Signed)
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 Briefly, Wendy Erickson is a 71 y.o. year old patient who has been noted to have a complex cystic lesion of the left kidney since at least 2009 when she was evaluated in [place] by Dr. Edwyna Shell. She moved to [facility] and established care with Dr. Retta Diones who has been following her renal lesion since January 2012. A biopsy of her lesion was performed in February 2012 and was non-diagnostic. Her lesion has been followed since with MR imaging. Her most recent imaging with MR in January 2012 indicated interval growth of her renal lesion from 6.5 cm to 7.1 cm over a 6 month period with areas in the rim of the mass which appeared more thickened and possibly enhancing. She has been asymptomatic with no evidence of hematuria or flank pain. She supposedly carries a history of CKD stage III based on review of her records although she denies any chronic renal insufficiency and her recent lab work does not suggest this diagnosis.  Dr. Laverle Patter independently reviewed her recent MRI which indicates a 7.1 cm left renal complex cyst with evidence of a thickend rim and possibly some enhancing components. Her mass encompasses the entire upper pole of the left kidney and extends down to the hilum of the kidney. There is no regional lymphadenopathy, contralateral kidney abnormalities except for simple cysts, no adrenal abnormalities, no renal vein or IVC involvement. 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 Secondary to these findings, it was felt the pt should undergo left radical nephrectomy. 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    Hospital Course: The pt was admitted and taken to the OR on 10/06/11 for left laproscopic radical nephrectomy.  The pt tolerated the procedure well and was hemodynamically stable immediately post op.  She was extubated without complication and woke up from anesthesia neurologically intact.  She was then transferred to the floor without difficulty.  Her post op course has progressed as expected.  Her bowel function returned and she is tolerating a regular diet.  She has remained AF with stable vitals signs.  Her wounds are healing well.  The pt ambulates with a walker at her baseline secondary to her cerebellar ataxia.  PT and OT have been working with her throughout the post op course and have recommended continuation of these therapies short term at a skilled level after discharge.  On 10/10/11 the pt is doing well and felt stable for d/c to a SNF short term for continuation of physical therapy prior to returning to an ALF.    Laboratory values:  Basename 10/08/11 0452  HGB 10.7*  HCT 32.2*    Basename 10/10/11 0413 10/09/11 0507  CREATININE 1.20* 1.20*   PATH:  Renal Cell Carcinoma Papillary type; negative surgical margins; pT1b pNX  Disposition: SNF short term and then return to prior ALF.  Discharge instruction: The patient was instructed to be ambulatory but told to refrain from heavy lifting, strenuous activity, or driving. She will continue PT and OT per their instructions.   Discharge medications:  Medication  List  As of 10/10/2011 11:38 AM   CHANGE how you take these medications         HYDROcodone-acetaminophen 5-500 MG per tablet   Commonly known as: VICODIN   Take 1-2 tablets by mouth every 6 (six) hours as needed for pain.   What changed: - dose - reasons to take the med - doctor's instructions         CONTINUE taking these medications         alendronate 70 MG  tablet   Commonly known as: FOSAMAX      amLODipine 2.5 MG tablet   Commonly known as: NORVASC      buPROPion 150 MG 24 hr tablet   Commonly known as: WELLBUTRIN XL      cholecalciferol 1000 UNITS tablet   Commonly known as: VITAMIN D      cloNIDine 0.1 MG tablet   Commonly known as: CATAPRES      FLUoxetine 20 MG capsule   Commonly known as: PROZAC      omeprazole 20 MG capsule   Commonly known as: PRILOSEC         STOP taking these medications         acetaminophen 325 MG tablet      trandolapril 1 MG tablet          Where to get your medications    These are the prescriptions that you need to pick up.   You may get these medications from any pharmacy.         HYDROcodone-acetaminophen 5-500 MG per tablet            Followup:  Follow-up Information    Follow up with Crecencio Mc, MD on 11/01/2011. (at 10:00)    Contact information:   [address] [place] [facility] [place] [address] [phone]

## 2011-10-10 NOTE — Progress Notes (Signed)
Patient ID: Wendy Erickson, female   DOB: 1940/12/09, 71 y.o.   MRN: [id number]  4 Days Post-Op Subjective: Pt passed flatus yesterday.  Feeling much better this morning. Tolerating regular diet. Ambulation still difficult related to surgery and cerebellar ataxia.  PT and OT evaluations completed and recommendation is for SNF to continue therapy.  Objective: Vital signs in last 24 hours: Temp:  [97.6 F (36.4 C)-98.6 F (37 C)] 98.2 F (36.8 C) (03/04 0525) Pulse Rate:  [57-62] 62  (03/04 0525) Resp:  [17-18] 18  (03/04 0525) BP: (116-151)/(57-81) 151/81 mmHg (03/04 0525) SpO2:  [91 %-98 %] 91 % (03/04 0525)  Intake/Output from previous day: 03/03 0701 - 03/04 0700 In: -  Out: 651 [Urine:650; Stool:1] Intake/Output this shift: Total I/O In: 240 [P.O.:240] Out: 200 [Urine:200]  Physical Exam:  General: Alert and oriented CV: RRR Lungs: Clear Abdomen: Soft, ND Incisions: C/D/I Ext: NT, No erythema  Lab Results:  [facility] 10/08/11 0452  HGB 10.7*  HCT 32.2*   BMET  Basename 10/10/11 0413 10/09/11 0507  NA 136 137  K 3.6 3.6  CL 100 104  CO2 28 26  GLUCOSE 96 120*  BUN 22 19  CREATININE 1.20* 1.20*  CALCIUM 8.9 8.9     Studies/Results: No results found.  Assessment/Plan: -- Renal function stable -- Path reviewed and discussed with patient.  Prognosis is good. -- Has met all criteria for discharge from the hospital. Remaining issue is ambulatory status. -- Patient lives at an assisted living facility although is unsure about the ability to provide the therapy and care that she will require.  Will await CSW evaluation today to determine if she will be able to go to her assisted living facility or if she will need higher acuity setting.  She is stable for discharge from postoperative standpoint to go today.   LOS: 4 days   Keithon Mccoin,LES 10/10/2011, 9:53 AM

## 2011-10-10 NOTE — Progress Notes (Signed)
Physical Therapy Treatment Patient Details Name: Wendy Erickson MRN: [id number] DOB: September 01, 1940 Today's Date: 10/10/2011  PT Assessment/Plan  PT - Assessment/Plan Comments on Treatment Session: Pt able to tolerate 140 feet of ambulation today with minA for steadying.  Pt plans to d/c to SNF today. PT Plan: Discharge plan remains appropriate;Frequency remains appropriate Follow Up Recommendations: Skilled nursing facility Equipment Recommended: Defer to next venue PT Goals  Acute Rehab PT Goals PT Goal: Sit to Stand - Progress: Progressing toward goal PT Goal: Stand to Sit - Progress: Progressing toward goal PT Goal: Ambulate - Progress: Progressing toward goal  PT Treatment Precautions/Restrictions  Precautions Precautions: Fall Required Braces or Orthoses: No Restrictions Weight Bearing Restrictions: No Mobility (including Balance) Bed Mobility Bed Mobility: No (pt up in recliner) Transfers Transfers: Yes Sit to Stand: 4: Min assist;With upper extremity assist;From chair/3-in-1 Sit to Stand Details (indicate cue type and reason): minA for posterior LOB upon rising Stand to Sit: 4: Min assist;To chair/3-in-1 Stand to Sit Details: min/guard, pt educated to reach back for armrests upon sitting as she did not reach back for armrests but held onto rollator Ambulation/Gait Ambulation/Gait: Yes Ambulation/Gait Assistance: 4: Min assist Ambulation/Gait Assistance Details (indicate cue type and reason): assist to stabilize pt ambulation, pt denies dizziness today, pt reports family history of cerebellar ataxia. Ambulation Distance (Feet): 140 Feet Assistive device: 4-wheeled walker (pt's rollator) Gait Pattern: Step-through pattern;Decreased stride length    Exercise    End of Session PT - End of Session Equipment Utilized During Treatment: Gait belt Activity Tolerance: Patient tolerated treatment well Patient left: in chair;with call bell in reach General Behavior During  Session: [facility] for tasks performed Cognition: [facility] for tasks performed  Wren Gallaga,KATHrine E 10/10/2011, 3:28 PM Pager: [phone]

## 2012-04-25 ENCOUNTER — Ambulatory Visit (HOSPITAL_COMMUNITY)
Admission: RE | Admit: 2012-04-25 | Discharge: 2012-04-25 | Disposition: A | Discharge status: Home / Self Care | Payer: Medicare Other | Source: Ambulatory Visit | Attending: Urology | Admitting: Urology

## 2012-04-25 ENCOUNTER — Other Ambulatory Visit (HOSPITAL_COMMUNITY): Payer: Self-pay | Admitting: Urology

## 2012-04-25 DIAGNOSIS — C649 Malignant neoplasm of unspecified kidney, except renal pelvis: Secondary | ICD-10-CM

## 2012-04-25 DIAGNOSIS — R911 Solitary pulmonary nodule: Secondary | ICD-10-CM | POA: Insufficient documentation

## 2012-04-25 DIAGNOSIS — J984 Other disorders of lung: Secondary | ICD-10-CM | POA: Insufficient documentation

## 2012-08-30 ENCOUNTER — Encounter (HOSPITAL_COMMUNITY): Payer: Self-pay

## 2012-08-30 ENCOUNTER — Emergency Department (HOSPITAL_COMMUNITY)
Admission: EM | Admit: 2012-08-30 | Discharge: 2012-08-30 | Disposition: A | Discharge status: Home / Self Care | Payer: Medicare Other | Attending: Emergency Medicine | Admitting: Emergency Medicine

## 2012-08-30 ENCOUNTER — Emergency Department (HOSPITAL_COMMUNITY): Payer: Medicare Other

## 2012-08-30 DIAGNOSIS — Z85528 Personal history of other malignant neoplasm of kidney: Secondary | ICD-10-CM | POA: Insufficient documentation

## 2012-08-30 DIAGNOSIS — N189 Chronic kidney disease, unspecified: Secondary | ICD-10-CM | POA: Insufficient documentation

## 2012-08-30 DIAGNOSIS — F3289 Other specified depressive episodes: Secondary | ICD-10-CM | POA: Insufficient documentation

## 2012-08-30 DIAGNOSIS — F329 Major depressive disorder, single episode, unspecified: Secondary | ICD-10-CM | POA: Insufficient documentation

## 2012-08-30 DIAGNOSIS — I69993 Ataxia following unspecified cerebrovascular disease: Secondary | ICD-10-CM | POA: Insufficient documentation

## 2012-08-30 DIAGNOSIS — Z79899 Other long term (current) drug therapy: Secondary | ICD-10-CM | POA: Insufficient documentation

## 2012-08-30 DIAGNOSIS — S82843A Displaced bimalleolar fracture of unspecified lower leg, initial encounter for closed fracture: Secondary | ICD-10-CM | POA: Insufficient documentation

## 2012-08-30 DIAGNOSIS — Y929 Unspecified place or not applicable: Secondary | ICD-10-CM | POA: Insufficient documentation

## 2012-08-30 DIAGNOSIS — Y939 Activity, unspecified: Secondary | ICD-10-CM | POA: Insufficient documentation

## 2012-08-30 DIAGNOSIS — Z87891 Personal history of nicotine dependence: Secondary | ICD-10-CM | POA: Insufficient documentation

## 2012-08-30 DIAGNOSIS — I129 Hypertensive chronic kidney disease with stage 1 through stage 4 chronic kidney disease, or unspecified chronic kidney disease: Secondary | ICD-10-CM | POA: Insufficient documentation

## 2012-08-30 DIAGNOSIS — W19XXXA Unspecified fall, initial encounter: Secondary | ICD-10-CM | POA: Insufficient documentation

## 2012-08-30 MED ORDER — HYDROCODONE-ACETAMINOPHEN 5-325 MG PO TABS: 1.0000 | ORAL_TABLET | ORAL | Status: DC | PRN

## 2012-08-30 NOTE — ED Notes (Addendum)
Per EMS, Pt c/o leg pain after a controlled fall.  Pt sts "it felt like my R leg gave out and I fell on my bottom."  Denies hitting head and LOC.  Pain score 5/10.  Vitals are stable.  No deformity noted.

## 2012-08-30 NOTE — ED Notes (Signed)
QIO:NGEX<BM> Expected date:<BR> Expected time:<BR> Means of arrival:<BR> Comments:<BR> Leg pain after a fall

## 2012-08-30 NOTE — ED Provider Notes (Signed)
History     CSN: [id number]  Arrival date & time 08/30/12  1242   First MD Initiated Contact with Patient 08/30/12 1354      Chief Complaint  Patient presents with  . Fall  . Leg Pain    (Consider location/radiation/quality/duration/timing/severity/associated sxs/prior treatment) HPI Comments: Patient presents with right leg pain after sustaining a fall. She states that occasionally her leg gives out on her which is what happened today and she fell down with her right leg going underneath her. She denies hitting her head or any loss of consciousness. She denies any neck or back pain. Chest constant throbbing pain to her lower leg in her ankle. She denies any other injuries from the fall. She denies any dizziness chest pain shortness of breath or other symptoms preceding the fall.  Patient is a 72 y.o. female presenting with fall and leg pain.  Fall Pertinent negatives include no fever, no numbness, no abdominal pain, no nausea, no vomiting, no hematuria and no headaches.  Leg Pain  Pertinent negatives include no numbness.    Past Medical History  Diagnosis Date  . Hypertension   . Chronic kidney disease     left renla neoplasm  . Ataxia   . Depression   . Cancer     left renal neoplasm    Past Surgical History  Procedure Date  . Tonsillectomy   . Abdominal hysterectomy   . Knee arthroscopy     bilateral   . Dilation and curettage of uterus   . Back surgery   . Laparoscopic nephrectomy 10/06/2011    Procedure: LAPAROSCOPIC NEPHRECTOMY;  Surgeon: Crecencio Mc, MD;  Location: [facility];  Service: Urology;  Laterality: Left;  LEFT LAPAROSCOPIC RADICAL NEPHRECTOMY      History reviewed. No pertinent family history.  History  Substance Use Topics  . Smoking status: Former Smoker    Quit date: 08/08/2005  . Smokeless tobacco: Never Used  . Alcohol Use: Yes     Comment: wine on Friday    OB History    Grav Para Term Preterm Abortions TAB SAB Ect Mult Living         Review of Systems  Constitutional: Negative for fever, chills, diaphoresis and fatigue.  HENT: Negative for congestion, rhinorrhea and sneezing.   Eyes: Negative.   Respiratory: Negative for cough, chest tightness and shortness of breath.   Cardiovascular: Negative for chest pain and leg swelling.  Gastrointestinal: Negative for nausea, vomiting, abdominal pain, diarrhea and blood in stool.  Genitourinary: Negative for frequency, hematuria, flank pain and difficulty urinating.  Musculoskeletal: Positive for arthralgias. Negative for back pain.  Skin: Negative for rash.  Neurological: Negative for dizziness, speech difficulty, weakness, numbness and headaches.    Allergies  Review of patient's allergies indicates no known allergies.  Home Medications   Current Outpatient Rx  Name  Route  Sig  Dispense  Refill  . BUPROPION HCL ER (XL) 150 MG PO TB24   Oral   Take 150 mg by mouth every morning.         Marland Kitchen VITAMIN D 1000 UNITS PO TABS   Oral   Take 2,000 Units by mouth every morning.         Marland Kitchen CLONIDINE HCL 0.1 MG PO TABS   Oral   Take 0.1 mg by mouth 2 (two) times daily.         Marland Kitchen GABAPENTIN 300 MG PO CAPS   Oral   Take 300 mg by mouth at  bedtime.         . OMEPRAZOLE 20 MG PO CPDR   Oral   Take 20 mg by mouth 2 (two) times daily.         Marland Kitchen CALCIUM POLYCARBOPHIL 625 MG PO TABS   Oral   Take 625 mg by mouth daily.         . TRANDOLAPRIL 1 MG PO TABS   Oral   Take 1 mg by mouth 2 (two) times daily.           BP 172/82  Pulse 61  Temp 97.7 F (36.5 C) (Oral)  Resp 16  SpO2 95%  Physical Exam  Constitutional: She is oriented to person, place, and time. She appears well-developed and well-nourished.  HENT:  Head: Normocephalic and atraumatic.  Eyes: Pupils are equal, round, and reactive to light.  Neck: Normal range of motion. Neck supple.  Cardiovascular: Normal rate, regular rhythm and normal heart sounds.   Pulmonary/Chest: Effort  normal and breath sounds normal. No respiratory distress. She has no wheezes. She has no rales. She exhibits no tenderness.  Abdominal: Soft. Bowel sounds are normal. There is no tenderness. There is no rebound and no guarding.  Musculoskeletal: Normal range of motion. She exhibits no edema.       Positive tenderness to her right proximal fibula. She also has tenderness over right lateral ankle. There is mild swelling around the ankle but no other swelling or deformity noted to the leg. She has no pain to the foot. She has no pain to her knee or hip. She is neurovascularly intact in that leg. There is no other pain on palpation or range of motion extremities.  Lymphadenopathy:    She has no cervical adenopathy.  Neurological: She is alert and oriented to person, place, and time.  Skin: Skin is warm and dry. No rash noted.  Psychiatric: She has a normal mood and affect.    ED Course  Procedures (including critical care time)  Results for orders placed during the hospital encounter of 10/06/11  BASIC METABOLIC PANEL      Component Value Range   Sodium 136  135 - 145 mEq/L   Potassium 3.7  3.5 - 5.1 mEq/L   Chloride 102  96 - 112 mEq/L   CO2 24  19 - 32 mEq/L   Glucose, Bld 149 (*) 70 - 99 mg/dL   BUN 17  6 - 23 mg/dL   Creatinine, Ser 1.61  0.50 - 1.10 mg/dL   Calcium 8.4  8.4 - 09.6 mg/dL   GFR calc non Af Amer 69 (*) >90 mL/min   GFR calc Af Amer 80 (*) >90 mL/min  HEMOGLOBIN AND HEMATOCRIT, BLOOD      Component Value Range   Hemoglobin 13.0  12.0 - 15.0 g/dL   HCT 04.5  40.9 - 81.1 %  BASIC METABOLIC PANEL      Component Value Range   Sodium 133 (*) 135 - 145 mEq/L   Potassium 4.9  3.5 - 5.1 mEq/L   Chloride 102  96 - 112 mEq/L   CO2 23  19 - 32 mEq/L   Glucose, Bld 140 (*) 70 - 99 mg/dL   BUN 14  6 - 23 mg/dL   Creatinine, Ser 9.14 (*) 0.50 - 1.10 mg/dL   Calcium 8.1 (*) 8.4 - 10.5 mg/dL   GFR calc non Af Amer 44 (*) >90 mL/min   GFR calc Af Amer 51 (*) >90 mL/min  HEMOGLOBIN AND HEMATOCRIT, BLOOD      Component Value Range   Hemoglobin 11.4 (*) 12.0 - 15.0 g/dL   HCT 45.4 (*) 09.8 - 11.9 %  BASIC METABOLIC PANEL      Component Value Range   Sodium 137  135 - 145 mEq/L   Potassium 3.9  3.5 - 5.1 mEq/L   Chloride 105  96 - 112 mEq/L   CO2 27  19 - 32 mEq/L   Glucose, Bld 103 (*) 70 - 99 mg/dL   BUN 12  6 - 23 mg/dL   Creatinine, Ser 1.47 (*) 0.50 - 1.10 mg/dL   Calcium 8.6  8.4 - 82.9 mg/dL   GFR calc non Af Amer 40 (*) >90 mL/min   GFR calc Af Amer 46 (*) >90 mL/min  HEMOGLOBIN AND HEMATOCRIT, BLOOD      Component Value Range   Hemoglobin 10.7 (*) 12.0 - 15.0 g/dL   HCT 56.2 (*) 13.0 - 86.5 %  BASIC METABOLIC PANEL      Component Value Range   Sodium 137  135 - 145 mEq/L   Potassium 3.6  3.5 - 5.1 mEq/L   Chloride 104  96 - 112 mEq/L   CO2 26  19 - 32 mEq/L   Glucose, Bld 120 (*) 70 - 99 mg/dL   BUN 19  6 - 23 mg/dL   Creatinine, Ser 7.84 (*) 0.50 - 1.10 mg/dL   Calcium 8.9  8.4 - 69.6 mg/dL   GFR calc non Af Amer 45 (*) >90 mL/min   GFR calc Af Amer 53 (*) >90 mL/min  BASIC METABOLIC PANEL      Component Value Range   Sodium 136  135 - 145 mEq/L   Potassium 3.6  3.5 - 5.1 mEq/L   Chloride 100  96 - 112 mEq/L   CO2 28  19 - 32 mEq/L   Glucose, Bld 96  70 - 99 mg/dL   BUN 22  6 - 23 mg/dL   Creatinine, Ser 2.95 (*) 0.50 - 1.10 mg/dL   Calcium 8.9  8.4 - 28.4 mg/dL   GFR calc non Af Amer 45 (*) >90 mL/min   GFR calc Af Amer 53 (*) >90 mL/min   Dg Tibia/fibula Right  08/30/2012  *RADIOLOGY REPORT*  Clinical Data: Repeated falls. Pain.  RIGHT TIBIA AND FIBULA - 2 VIEW  Comparison: Plain films of the right knee 11/24/2010.  Findings: The patient has a lateral malleolar fracture which is described on dedicated plain films the ankle this same date.  No other acute bony or joint abnormality is identified. Chondrocalcinosis of the knee is noted.  IMPRESSION:  1.  Lateral malleolar fracture. 2.  Chondrocalcinosis of the knee.   Original  Report Authenticated By: Holley Dexter, M.D.    Dg Ankle Complete Right  08/30/2012  *RADIOLOGY REPORT*  Clinical Data: Repeated falls.  Pain.  RIGHT ANKLE - COMPLETE 3+ VIEW  Comparison: None.  Findings: There is soft tissue swelling about the lateral aspect of the ankle.  The patient has a nondisplaced fracture of the tip of the distal fibula.  There also appears to be a chip fracture of the medial process of the talus.  No other acute bony or joint abnormality is identified.  IMPRESSION: Lateral malleolar fracture and likely chip fracture off the medial process of the talus.   Original Report Authenticated By: Holley Dexter, M.D.       1. Bimalleolar fracture       MDM  Patient is placed in a posterior splint. Is advised denies elevation. Will use a walker at a nursing facility. I gave her a referral to follow up with orthopedics. Advised to return here if her symptoms worsen.        Rolan Bucco, MD 08/30/12 1444

## 2012-08-30 NOTE — ED Notes (Signed)
Ortho Tech at bedside.  

## 2012-08-30 NOTE — ED Notes (Signed)
Pt c/o R knee and ankle pain.  Pain score 5/10.  Swelling noted to outside of R ankle.  Pt sts that she has fallen several times this week.  Pt sts "I felt like I was going to fall, so I back up against a wall, but I still went down."  Sts her R leg "buckled and tucked under."

## 2012-08-30 NOTE — ED Notes (Signed)
Pt escorted to discharge window. Verbalized understanding discharge instructions. In no acute distress.   

## 2012-10-30 ENCOUNTER — Ambulatory Visit (HOSPITAL_COMMUNITY)
Admission: RE | Admit: 2012-10-30 | Discharge: 2012-10-30 | Disposition: A | Discharge status: Home / Self Care | Payer: Medicare Other | Source: Ambulatory Visit | Attending: Urology | Admitting: Urology

## 2012-10-30 ENCOUNTER — Other Ambulatory Visit (HOSPITAL_COMMUNITY): Payer: Self-pay | Admitting: Urology

## 2012-10-30 DIAGNOSIS — C649 Malignant neoplasm of unspecified kidney, except renal pelvis: Secondary | ICD-10-CM | POA: Insufficient documentation

## 2012-10-30 DIAGNOSIS — J984 Other disorders of lung: Secondary | ICD-10-CM | POA: Insufficient documentation

## 2012-11-28 ENCOUNTER — Emergency Department (HOSPITAL_COMMUNITY): Payer: Medicare Other

## 2012-11-28 ENCOUNTER — Encounter (HOSPITAL_COMMUNITY): Payer: Self-pay | Admitting: Emergency Medicine

## 2012-11-28 ENCOUNTER — Emergency Department (HOSPITAL_COMMUNITY)
Admission: EM | Admit: 2012-11-28 | Discharge: 2012-11-28 | Disposition: A | Discharge status: Home / Self Care | Payer: Medicare Other | Attending: Emergency Medicine | Admitting: Emergency Medicine

## 2012-11-28 DIAGNOSIS — Y9389 Activity, other specified: Secondary | ICD-10-CM | POA: Insufficient documentation

## 2012-11-28 DIAGNOSIS — Z79899 Other long term (current) drug therapy: Secondary | ICD-10-CM | POA: Insufficient documentation

## 2012-11-28 DIAGNOSIS — M25473 Effusion, unspecified ankle: Secondary | ICD-10-CM | POA: Insufficient documentation

## 2012-11-28 DIAGNOSIS — F329 Major depressive disorder, single episode, unspecified: Secondary | ICD-10-CM | POA: Insufficient documentation

## 2012-11-28 DIAGNOSIS — S93401A Sprain of unspecified ligament of right ankle, initial encounter: Secondary | ICD-10-CM

## 2012-11-28 DIAGNOSIS — R209 Unspecified disturbances of skin sensation: Secondary | ICD-10-CM | POA: Insufficient documentation

## 2012-11-28 DIAGNOSIS — R279 Unspecified lack of coordination: Secondary | ICD-10-CM | POA: Insufficient documentation

## 2012-11-28 DIAGNOSIS — M25476 Effusion, unspecified foot: Secondary | ICD-10-CM | POA: Insufficient documentation

## 2012-11-28 DIAGNOSIS — Z9089 Acquired absence of other organs: Secondary | ICD-10-CM | POA: Insufficient documentation

## 2012-11-28 DIAGNOSIS — X500XXA Overexertion from strenuous movement or load, initial encounter: Secondary | ICD-10-CM | POA: Insufficient documentation

## 2012-11-28 DIAGNOSIS — Z85528 Personal history of other malignant neoplasm of kidney: Secondary | ICD-10-CM | POA: Insufficient documentation

## 2012-11-28 DIAGNOSIS — F3289 Other specified depressive episodes: Secondary | ICD-10-CM | POA: Insufficient documentation

## 2012-11-28 DIAGNOSIS — Y929 Unspecified place or not applicable: Secondary | ICD-10-CM | POA: Insufficient documentation

## 2012-11-28 DIAGNOSIS — S93409A Sprain of unspecified ligament of unspecified ankle, initial encounter: Secondary | ICD-10-CM | POA: Insufficient documentation

## 2012-11-28 DIAGNOSIS — Z8781 Personal history of (healed) traumatic fracture: Secondary | ICD-10-CM | POA: Insufficient documentation

## 2012-11-28 DIAGNOSIS — R296 Repeated falls: Secondary | ICD-10-CM | POA: Insufficient documentation

## 2012-11-28 DIAGNOSIS — N189 Chronic kidney disease, unspecified: Secondary | ICD-10-CM | POA: Insufficient documentation

## 2012-11-28 DIAGNOSIS — M79671 Pain in right foot: Secondary | ICD-10-CM

## 2012-11-28 MED ORDER — HYDROCODONE-ACETAMINOPHEN 5-325 MG PO TABS: 1.0000 | ORAL_TABLET | ORAL | Status: DC | PRN

## 2012-11-28 MED ORDER — OXYCODONE-ACETAMINOPHEN 5-325 MG PO TABS: 2.0000 | ORAL_TABLET | Freq: Once | ORAL | Status: DC

## 2012-11-28 MED ORDER — OXYCODONE-ACETAMINOPHEN 5-325 MG PO TABS
2.0000 | ORAL_TABLET | Freq: Once | ORAL | Status: AC
  Administered 2012-11-28: 2 via ORAL
  Filled 2012-11-28: qty 2

## 2012-11-28 NOTE — ED Notes (Signed)
Pt fell and twisted rt ankle. Pt fractured same ankle the same ankle about 2 mos ago. Pulse present in foot. Vitals stable. Slight swelling to foot. Pt also hit back of head but no LOC, neck pain, or head pain.

## 2012-11-28 NOTE — ED Notes (Signed)
Pt states he knee gave out and she fell and twisted her ankle. Pt had a fracture in same ankle 2 mos ago. Pt rates pain 9/10.  Slight swelling noted.

## 2012-11-28 NOTE — ED Provider Notes (Signed)
Patient states her right knee buckled and she fell injuring her right ankle and foot. Patient was seen in January when she had a lateral malleolus fracture of her right ankle. She was treated by Dr. Victorino Dike  with a Verdene Rio.  Patient has mild diffuse swelling of her right ankle and of the dorsum of her right foot with diffuse tenderness. Her knee is nontender to palpation.  Ward Givens, MD 11/28/12 1902

## 2012-11-28 NOTE — ED Provider Notes (Signed)
History     CSN: [id number]  Arrival date & time 11/28/12  4098   First MD Initiated Contact with Patient 11/28/12 1831      Chief Complaint  Patient presents with  . 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 Patient states she was in the bathroom when her bilateral knees buckled and she fell backwards twisting her right ankle. Patient admits to associated pain and swelling at has been unchanged since onset. The patient denies any radiation of the pain and states it is constant, made worse with weightbearing and palpation; denies any alleviating factors. Patient has a history of bimalleolar fracture 3 months ago which was treated with a soft cast and a boot. Patient denies fever, lightheadedness, dizziness, LOC, and color change in her RLE.  Patient is a 72 y.o. female presenting with ankle pain. The history is provided by the patient. No language interpreter was used.  Ankle Pain Associated symptoms: no fever     Past Medical History  Diagnosis Date  . Hypertension   . Chronic kidney disease     left renla neoplasm  . Ataxia   . Depression   . Cancer     left renal neoplasm    Past Surgical History  Procedure Laterality Date  . Tonsillectomy    . Abdominal hysterectomy    . Knee arthroscopy      bilateral   . Dilation and curettage of uterus    . Back surgery    . Laparoscopic nephrectomy  10/06/2011    Procedure: LAPAROSCOPIC NEPHRECTOMY;  Surgeon: Crecencio Mc, MD;  Location: [facility];  Service: Urology;  Laterality: Left;  LEFT LAPAROSCOPIC RADICAL NEPHRECTOMY      History reviewed. No pertinent family history.  History  Substance Use Topics  . Smoking status: Former Smoker    Quit date: 08/08/2005  .  Smokeless tobacco: Never Used  . Alcohol Use: Yes     Comment: wine on Friday    OB History   Grav Para Term Preterm Abortions TAB SAB Ect Mult Living                  Review of Systems  Constitutional: Negative for fever.  Musculoskeletal: Positive for arthralgias.  Skin: Negative for color change, pallor and rash.  Neurological: Negative for syncope.       +numbness in RLE  All other systems reviewed and are negative.    Allergies  Review of patient's allergies indicates no known allergies.  Home Medications   Current Outpatient Rx  Name  Route  Sig  Dispense  Refill  . buPROPion (WELLBUTRIN XL) 150 MG 24 hr tablet   Oral   Take 150 mg by mouth every morning.         . cholecalciferol (VITAMIN D) 1000 UNITS tablet   Oral   Take 2,000 Units by mouth every morning.         . cloNIDine (CATAPRES) 0.1 MG tablet   Oral   Take 0.1 mg by mouth daily.          Marland Kitchen gabapentin (NEURONTIN) 300 MG capsule   Oral   Take 300 mg by mouth 2 (two) times daily.          Marland Kitchen omeprazole (PRILOSEC) 20 MG capsule  Oral   Take 20 mg by mouth 2 (two) times daily.         . polycarbophil (FIBERCON) 625 MG tablet   Oral   Take 625 mg by mouth daily.         Marland Kitchen HYDROcodone-acetaminophen (NORCO/VICODIN) 5-325 MG per tablet   Oral   Take 1 tablet by mouth every 4 (four) hours as needed for pain.   20 tablet   0     BP 171/84  Pulse 75  Temp(Src) 98.3 F (36.8 C) (Oral)  Resp 20  SpO2 92%  Physical Exam  Nursing note and vitals reviewed. Constitutional: She is oriented to person, place, and time. She appears well-developed and well-nourished. No distress.  HENT:  Head: Normocephalic and atraumatic.  Eyes: Conjunctivae are normal. No scleral icterus.  Neck: Normal range of motion.  Cardiovascular: Normal rate, regular rhythm and normal heart sounds.   2+ DP and PT pulses in RLE; normal capillary refill  Pulmonary/Chest: Effort normal and breath sounds normal. No  respiratory distress. She has no wheezes. She has no rales.  Musculoskeletal:  TTP of lateral malleolus and along lateral aspect of R foot. Swelling appreciated in R ankle and foot. ROM limited 2/2 pain and discomfort. No pulselessness, poikilothermia, or pallor.  Neurological: She is alert and oriented to person, place, and time.  Skin: Skin is warm and dry. No rash noted. She is not diaphoretic. No erythema. No pallor.  Psychiatric: She has a normal mood and affect. Her behavior is normal.    ED Course  Procedures (including critical care time)  Labs Reviewed - No data to display Dg Ankle Complete Right  11/28/2012  *RADIOLOGY REPORT*  Clinical Data: 72 year old female status post fall with pain.  RIGHT ANKLE - COMPLETE 3+ VIEW  Comparison: 08/30/2012.  Findings: Mortise joint alignment stable and preserved.  Chronic ossific fragments at the medial and lateral malleoli.  Possible joint effusion.  Calcaneus intact.  Talar dome intact.  IMPRESSION: Possible joint effusion but no definite acute fracture identified. Chronic-appearing ossific fragments at the medial and lateral malleoli.   Original Report Authenticated By: Erskine Speed, M.D.    Dg Foot Complete Right  11/28/2012  *RADIOLOGY REPORT*  Clinical Data: 72 year old female with pain.  RIGHT FOOT COMPLETE - 3+ VIEW  Comparison: Right ankle series 08/30/2012.  Findings: Calcaneus intact. Bone mineralization is within normal limits for age.  No acute fracture or dislocation identified in the right foot.  Accessory ossicle adjacent to the cuboid.  Chronic- appearing ossific fragment lateral malleolus.  IMPRESSION: No acute fracture or dislocation identified about the right foot.   Original Report Authenticated By: Odessa Fleming III, M.D.      1. Ankle sprain, right, initial encounter   2. Foot pain, right      MDM  Patient is a 72 y/o female with hx of ataxia who presents for R ankle and foot pain after fall at home today. Physical exam  significant for swelling of the R ankle and dorsal surface of R foot; TTP of lateral malleolus and along lateral dorsal surface of foot appreciated. No pallor, poikilothermia, or pulselessness at this time. Patient with hx of bimalleolar fx in 08/2012, followed by Dr. Victorino Dike. Will obtain x-rays for further evaluation of pain and swelling. Ice applied to ankle and percocet ordered for pain.  I have personally reviewed this patient's xrays which show no evidence of acute fracture or dislocation in the R foot. No definite acute fracture of  R ankle as images consistent with old, healing bimalleolar fx from 3 months ago. Patient's pain controlled with percocet. She has remained well and nontoxic appearing and without pallor, poikilothermia or pulselessness in the R ankle and foot. Stable for d/c, after application of cam walker, with orthopedic f/u for further evaluation. Norco prescribed for pain control and indications for ED return provided. Patient states comfort and understanding with this d/c plan with no unaddressed concerns. Patient's w/u and management discussed with Dr. Lynelle Doctor who is in agreement.        Antony Madura, PA-C 11/29/12 [phone]

## 2012-11-29 NOTE — ED Provider Notes (Signed)
See prior note   Jetaun Colbath L Bosco Paparella, MD 11/29/12 1502 

## 2013-03-11 ENCOUNTER — Telehealth: Payer: Self-pay | Admitting: Neurology

## 2013-03-11 NOTE — Telephone Encounter (Signed)
Pt has not been seen by Dr. Frances Furbish, prior Dr. Sandria Manly pt needs to be reassigned per Dr. Frances Furbish

## 2013-03-13 ENCOUNTER — Telehealth: Payer: Self-pay | Admitting: Neurology

## 2013-03-13 NOTE — Telephone Encounter (Signed)
Assigned to Dr. Willis. 

## 2013-03-18 ENCOUNTER — Encounter: Payer: Self-pay | Admitting: Neurology

## 2013-04-11 ENCOUNTER — Other Ambulatory Visit: Payer: Self-pay | Admitting: Nurse Practitioner

## 2013-04-11 ENCOUNTER — Ambulatory Visit
Admission: RE | Admit: 2013-04-11 | Discharge: 2013-04-11 | Disposition: A | Discharge status: Home / Self Care | Payer: Medicare Other | Source: Ambulatory Visit | Attending: Nurse Practitioner | Admitting: Nurse Practitioner

## 2013-04-11 ENCOUNTER — Emergency Department (HOSPITAL_COMMUNITY): Payer: Medicare Other

## 2013-04-11 ENCOUNTER — Encounter (HOSPITAL_COMMUNITY): Payer: Self-pay | Admitting: Adult Health

## 2013-04-11 ENCOUNTER — Inpatient Hospital Stay (HOSPITAL_COMMUNITY)
Admission: EM | Admit: 2013-04-11 | Discharge: 2013-04-15 | DRG: 699 | Disposition: A | Discharge status: Nursing Home (Includes ICF) | Payer: Medicare Other | Attending: Internal Medicine | Admitting: Internal Medicine

## 2013-04-11 DIAGNOSIS — R509 Fever, unspecified: Secondary | ICD-10-CM

## 2013-04-11 DIAGNOSIS — Q600 Renal agenesis, unilateral: Secondary | ICD-10-CM

## 2013-04-11 DIAGNOSIS — N179 Acute kidney failure, unspecified: Secondary | ICD-10-CM | POA: Diagnosis present

## 2013-04-11 DIAGNOSIS — Z905 Acquired absence of kidney: Secondary | ICD-10-CM

## 2013-04-11 DIAGNOSIS — R6883 Chills (without fever): Secondary | ICD-10-CM

## 2013-04-11 DIAGNOSIS — IMO0002 Reserved for concepts with insufficient information to code with codable children: Secondary | ICD-10-CM

## 2013-04-11 DIAGNOSIS — I10 Essential (primary) hypertension: Secondary | ICD-10-CM

## 2013-04-11 DIAGNOSIS — N12 Tubulo-interstitial nephritis, not specified as acute or chronic: Secondary | ICD-10-CM

## 2013-04-11 DIAGNOSIS — N189 Chronic kidney disease, unspecified: Secondary | ICD-10-CM

## 2013-04-11 DIAGNOSIS — F329 Major depressive disorder, single episode, unspecified: Secondary | ICD-10-CM | POA: Diagnosis present

## 2013-04-11 DIAGNOSIS — N139 Obstructive and reflux uropathy, unspecified: Secondary | ICD-10-CM | POA: Diagnosis present

## 2013-04-11 DIAGNOSIS — N135 Crossing vessel and stricture of ureter without hydronephrosis: Secondary | ICD-10-CM

## 2013-04-11 DIAGNOSIS — N1330 Unspecified hydronephrosis: Secondary | ICD-10-CM | POA: Diagnosis present

## 2013-04-11 DIAGNOSIS — N182 Chronic kidney disease, stage 2 (mild): Secondary | ICD-10-CM | POA: Diagnosis present

## 2013-04-11 DIAGNOSIS — Z87891 Personal history of nicotine dependence: Secondary | ICD-10-CM

## 2013-04-11 DIAGNOSIS — I129 Hypertensive chronic kidney disease with stage 1 through stage 4 chronic kidney disease, or unspecified chronic kidney disease: Secondary | ICD-10-CM | POA: Diagnosis present

## 2013-04-11 DIAGNOSIS — N390 Urinary tract infection, site not specified: Secondary | ICD-10-CM

## 2013-04-11 DIAGNOSIS — Z85528 Personal history of other malignant neoplasm of kidney: Secondary | ICD-10-CM

## 2013-04-11 DIAGNOSIS — N2889 Other specified disorders of kidney and ureter: Principal | ICD-10-CM | POA: Diagnosis present

## 2013-04-11 DIAGNOSIS — Q619 Cystic kidney disease, unspecified: Secondary | ICD-10-CM

## 2013-04-11 DIAGNOSIS — F3289 Other specified depressive episodes: Secondary | ICD-10-CM | POA: Diagnosis present

## 2013-04-11 HISTORY — DX: Hereditary ataxia, unspecified: G11.9

## 2013-04-11 HISTORY — DX: Other specified disorders of bone density and structure, unspecified site: M85.80

## 2013-04-11 LAB — URINALYSIS, ROUTINE W REFLEX MICROSCOPIC
Nitrite: NEGATIVE
Specific Gravity, Urine: 1.01 (ref 1.005–1.030)
pH: 5 (ref 5.0–8.0)

## 2013-04-11 LAB — COMPREHENSIVE METABOLIC PANEL
BUN: 42 mg/dL — ABNORMAL HIGH (ref 6–23)
Calcium: 9.1 mg/dL (ref 8.4–10.5)
Creatinine, Ser: 1.96 mg/dL — ABNORMAL HIGH (ref 0.50–1.10)
GFR calc Af Amer: 29 mL/min — ABNORMAL LOW (ref >90)
Glucose, Bld: 78 mg/dL (ref 70–99)
Total Protein: 7.6 g/dL (ref 6.0–8.3)

## 2013-04-11 LAB — CG4 I-STAT (LACTIC ACID): Lactic Acid, Venous: 1.01 mmol/L (ref 0.5–2.2)

## 2013-04-11 LAB — URINE MICROSCOPIC-ADD ON

## 2013-04-11 LAB — CBC
HCT: 32.9 % — ABNORMAL LOW (ref 36.0–46.0)
Hemoglobin: 11.6 g/dL — ABNORMAL LOW (ref 12.0–15.0)
MCH: 30.9 pg (ref 26.0–34.0)
MCHC: 35.3 g/dL (ref 30.0–36.0)
MCV: 87.5 fL (ref 78.0–100.0)

## 2013-04-11 LAB — TROPONIN I: Troponin I: <0.3 ng/mL (ref <0.30)

## 2013-04-11 LAB — TYPE AND SCREEN

## 2013-04-11 LAB — FIBRINOGEN: Fibrinogen: >800 mg/dL — ABNORMAL HIGH (ref 204–475)

## 2013-04-11 MED ORDER — DEXTROSE 5 % IV SOLN
2.0000 g | Freq: Once | INTRAVENOUS | Status: AC
  Administered 2013-04-11: 2 g via INTRAVENOUS
  Filled 2013-04-11: qty 2

## 2013-04-11 MED ORDER — ACETAMINOPHEN 325 MG PO TABS
650.0000 mg | ORAL_TABLET | Freq: Once | ORAL | Status: AC
  Administered 2013-04-11: 650 mg via ORAL
  Filled 2013-04-11: qty 2

## 2013-04-11 MED ORDER — SODIUM CHLORIDE 0.9 % IV BOLUS (SEPSIS)
1000.0000 mL | INTRAVENOUS | Status: DC | PRN
  Administered 2013-04-11: 1000 mL via INTRAVENOUS

## 2013-04-11 NOTE — ED Provider Notes (Signed)
CSN: [id number]     Arrival date & time 04/11/13  1700 History   First MD Initiated Contact with Patient 04/11/13 1820     Chief Complaint  Patient presents with  . Abdominal Pain   (Consider location/radiation/quality/duration/timing/severity/associated sxs/prior Treatment) HPI Comments: Wendy Erickson is a 72 y.o. Female presents for evaluation of abdominal pain. The pain started yesterday.  She has had fever and chills. She has had nausea and a single episode of diarrhea. There's been no vomiting. She has decreased urinary output. She's had weakness for about 4 weeks. She has a dry mouth. She is status post removal of left kidney for renal cell carcinoma. No other recent illnesses. She lives in an assisted living facility. There are no other known modifying factors.   Patient is a 72 y.o. female presenting with abdominal pain. The history is provided by the patient.  Abdominal Pain   Past Medical History  Diagnosis Date  . Hypertension   . Chronic kidney disease     left renla neoplasm  . Ataxia   . Depression   . Cancer     left renal neoplasm  . 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 Osteopenia    Past Surgical History  Procedure Laterality Date  . Tonsillectomy    . Abdominal hysterectomy    . Knee arthroscopy      bilateral   . Dilation and curettage of uterus    . Back surgery    . Laparoscopic nephrectomy  10/06/2011    Procedure: LAPAROSCOPIC NEPHRECTOMY;  Surgeon: Crecencio Mc, MD;  Location: [facility];  Service: Urology;  Laterality: Left;  LEFT LAPAROSCOPIC RADICAL NEPHRECTOMY     History reviewed. No pertinent family history. History  Substance Use Topics  . Smoking status: Former Smoker    Quit date: 08/08/2005  . Smokeless tobacco: Never Used  . Alcohol Use: Yes     Comment: wine on Friday   OB History   Grav Para Term Preterm Abortions TAB SAB Ect Mult Living                 Review of Systems  Gastrointestinal: Positive for abdominal pain.  All other  systems reviewed and are negative.    Allergies  Review of patient's allergies indicates no known allergies.  Home Medications   Current Outpatient Rx  Name  Route  Sig  Dispense  Refill  . buPROPion (WELLBUTRIN XL) 150 MG 24 hr tablet   Oral   Take 150 mg by mouth every morning.         . cholecalciferol (VITAMIN D) 1000 UNITS tablet   Oral   Take 2,000 Units by mouth every morning.         . cloNIDine (CATAPRES) 0.1 MG tablet   Oral   Take 0.1 mg by mouth daily.          Marland Kitchen FLUoxetine HCl (PROZAC PO)   Oral   Take 20 mg by mouth daily.          Marland Kitchen gabapentin (NEURONTIN) 300 MG capsule   Oral   Take 300 mg by mouth 2 (two) times daily.          Marland Kitchen HYDROcodone-acetaminophen (NORCO/VICODIN) 5-325 MG per tablet   Oral   Take 1 tablet by mouth every 4 (four) hours as needed for pain.   20 tablet   0   . omeprazole (PRILOSEC) 20 MG capsule   Oral   Take 20 mg by mouth 2 (  two) times daily.         . polycarbophil (FIBERCON) 625 MG tablet   Oral   Take 625 mg by mouth daily.         . trandolapril (MAVIK) 1 MG tablet   Oral   Take 1 mg by mouth 2 (two) times daily.          BP 131/49  Pulse 91  Temp(Src) 98.2 F (36.8 C) (Oral)  Resp 16  SpO2 96% Physical Exam  Nursing note and vitals reviewed. Constitutional: She appears well-developed.  Elderly, frail  HENT:  Head: Normocephalic and atraumatic.  Eyes: Conjunctivae and EOM are normal. Pupils are equal, round, and reactive to light.  Neck: Normal range of motion and phonation normal. Neck supple.  Cardiovascular: Normal rate, regular rhythm and intact distal pulses.   Pulmonary/Chest: Effort normal and breath sounds normal. She exhibits no tenderness.  Abdominal: Soft. She exhibits no distension and no mass. There is tenderness (mild right upper and lower quadrant tenderness.). There is no rebound and no guarding.  Musculoskeletal: Normal range of motion. She exhibits no edema and no  tenderness.  Neurological: She is alert. She has normal strength. No cranial nerve deficit. She exhibits normal muscle tone.  Skin: Skin is warm and dry.  Psychiatric: Her behavior is normal.    ED Course  Procedures (including critical care time)  Medications  sodium chloride 0.9 % bolus 1,000 mL (1,000 mLs Intravenous New Bag/Given 04/11/13 1931)  cefTRIAXone (ROCEPHIN) 2 g in dextrose 5 % 50 mL IVPB (2 g Intravenous New Bag/Given 04/11/13 1958)  acetaminophen (TYLENOL) tablet 650 mg (650 mg Oral Given 04/11/13 2053)    Patient Vitals for the past 24 hrs:  BP Temp Temp src Pulse Resp SpO2  04/11/13 2037 131/49 mmHg - - 91 16 96 %  04/11/13 2030 131/49 mmHg - - 91 17 96 %  04/11/13 2000 138/60 mmHg - - 90 18 97 %  04/11/13 1930 125/49 mmHg - - 87 18 97 %  04/11/13 1900 95/36 mmHg - - 92 - 98 %  04/11/13 1752 132/58 mmHg - - 82 - 90 %  04/11/13 1716 93/69 mmHg 98.2 F (36.8 C) Oral 88 16 99 %    10:25 PM-Consult complete with Dr. Marlou Porch. Patient case explained and discussed.  He agrees to consult, and requests Hospitalist to admit patient for further evaluation and treatment. Hospitalist is to call him, to discuss the case. Call ended at 2255  10:57 PM-Consult complete with Hospitalist. Patient case explained and discussed. He agrees to admit patient for further evaluation and treatment. Call ended at 2310    Date: 04/11/13  Rate: 90  Rhythm: normal sinus rhythm  QRS Axis: normal  PR and QT Intervals: normal  ST/T Wave abnormalities: normal  PR and QRS Conduction Disutrbances:none  Narrative Interpretation:   Old EKG Reviewed: none available   Labs Review Labs Reviewed  CBC - Abnormal; Notable for the following:    WBC 21.7 (*)    RBC 3.76 (*)    Hemoglobin 11.6 (*)    HCT 32.9 (*)    All other components within normal limits  COMPREHENSIVE METABOLIC PANEL - Abnormal; Notable for the following:    Sodium 130 (*)    Chloride 91 (*)    BUN 42 (*)    Creatinine, Ser 1.96  (*)    Albumin 3.4 (*)    GFR calc non Af Amer 25 (*)    GFR calc Af  Amer 29 (*)    All other components within normal limits  URINALYSIS, ROUTINE W REFLEX MICROSCOPIC - Abnormal; Notable for the following:    APPearance HAZY (*)    Hgb urine dipstick SMALL (*)    Ketones, ur 15 (*)    Protein, ur 30 (*)    Leukocytes, UA TRACE (*)    All other components within normal limits  APTT - Abnormal; Notable for the following:    aPTT 40 (*)    All other components within normal limits  FIBRINOGEN - Abnormal; Notable for the following:    Fibrinogen >800 (*)    All other components within normal limits  URINE MICROSCOPIC-ADD ON - Abnormal; Notable for the following:    Squamous Epithelial / LPF MANY (*)    Bacteria, UA FEW (*)    All other components within normal limits  CULTURE, BLOOD (ROUTINE X 2)  CULTURE, BLOOD (ROUTINE X 2)  URINE CULTURE  TROPONIN I  PROTIME-INR  CORTISOL  CG4 I-STAT (LACTIC ACID)  TYPE AND SCREEN  ABO/RH   Imaging Review Ct Abdomen Pelvis Wo Contrast  04/11/2013   *RADIOLOGY REPORT*  Clinical Data: History of left-sided renal cell carcinoma.  Right lower quadrant pain.  Elevated white count.  Question appendicitis versus diverticulitis.  Prior hysterectomy and both rectum. Significant change in renal function in the past 3 months.  GFR 20.  CT ABDOMEN AND PELVIS WITHOUT CONTRAST  Technique:  Multidetector CT imaging of the abdomen and pelvis was performed following the standard protocol without intravenous contrast.  Comparison: 10/30/2012 CT.  08/24/2011 MR.  Findings: Abnormal appearance of the right kidney.  This includes dilated right renal collecting system with an appearance of a right ureteral pelvic junction obstruction.  On the prior examination, vessel is seen crossing along the inferior aspect of the dilated right renal pelvis.  Additionally, interval development of 3.2 cm hemorrhagic appearing lesion inferior lateral aspect of the right kidney.  This may  represent hemorrhage into the previously noted cystic lesion. Infiltration of surrounding fat planes.  With a history of fever, superimposed infection is not excluded.  Urologic consultation is recommended.  No definitive bowel inflammatory process noted.  Appendix not clearly visualized and may been removed at the time of hysterectomy.  Liver lesions better delineated on the prior CT without gross change.  Splenic granulomas.  Trace pericardial fluid.  Right adrenal 2 cm lesion possibly an adenoma unchanged.  No left adrenal lesion.  No obvious pancreatic mass.  Prominent sized gallbladder without calcified gallstones.  Basilar subsegmental atelectasis versus scarring.  Trace pericardial effusion.  Coronary artery calcifications.  Atherosclerotic type changes of the aorta with mild ectasia. Atherosclerotic type changes iliac arteries with ectasia.  Distal left common iliac artery measures up to 1.6 cm.  Degenerative changes lumbar spine and lower thoracic spine without bony destructive lesion noted.  IMPRESSION: Abnormal appearance of the right kidney.  This includes dilated right renal collecting system with an appearance of a right ureteral pelvic junction obstruction.  On the prior examination, vessel is seen crossing along the inferior aspect of the dilated right renal pelvis.  Additionally, interval development of 3.2 cm hemorrhagic appearing lesion inferior lateral aspect of the right kidney.  This may represent hemorrhage into the previously noted cystic lesion. Infiltration of surrounding fat planes.  With a history of fever, superimposed infection is not excluded.  Urologic consultation is recommended.  No definitive bowel inflammatory process noted.  Appendix not clearly visualized and may been  removed at the time of hysterectomy.  Critical Value/emergent results were called by telephone at the time of interpretation on 04/11/2013 at 2:40 p.m. to Levonne Lapping nurse practitioner, who verbally acknowledged  these results.   Original Report Authenticated By: Lacy Duverney, M.D.   Dg Chest Portable 1 View  04/11/2013   *RADIOLOGY REPORT*  Clinical Data: Shortness of breath, cough, fever  PORTABLE CHEST - 1 VIEW  Comparison: October 30, 2012  Findings: There is no focal infiltrate, pulmonary edema, or pleural effusion.  The previously noted 4 mm nodule in the right mid to lower lung is unchanged.  The mediastinal contour and cardiac silhouette are normal.  The soft tissues and osseous structures are stable.  IMPRESSION: No acute cardiopulmonary disease identified.   Original Report Authenticated By: Sherian Rein, M.D.    MDM   1. Febrile illness   2. UTI (lower urinary tract infection)   3. Ureteropelvic junction obstruction   4. Solitary kidney   5. Acute kidney injury (nontraumatic)      Abdominal pain, with fever, and abnormal CT scan indicating obstruction of right ureteropelvic junction. She also has significant acute kidney injury with decreased GFR from 53-29. Etiology of pain, is not clear. She has worrisome findings for acute kidney infection, and abnormal urinalysis. She's not frankly septic. She is calm, comfortable, treatments initiated in the emergency department. Evaluation by urology for possible instrumentation, further testing to evaluate for the exact nature of the kidney abnormality.  Nursing Notes Reviewed/ Care Coordinated, and agree without changes. Applicable Imaging Reviewed.  Interpretation of Laboratory Data incorporated into ED treatment  Plan: Admit    Flint Melter, MD 04/12/13 [phone]

## 2013-04-11 NOTE — ED Notes (Signed)
Admitting MD at bedside.

## 2013-04-11 NOTE — ED Notes (Addendum)
Presents from [facility] with one month of fatigue and abdominal pain. Pain is right sided and generalized associated with fever of 102.0, chills, that began last night and nausea, vomiting and diarrhea. Admits to dysuria, frequency and foul odor to urine. WBC 21.9, pt had CT completed at [facility] imaging.  Results with patient.  R kidney bleed into cyst hyperdense and enlarged.

## 2013-04-11 NOTE — H&P (Signed)
[facility] History and Physical  Ruthella Kirchman ZOX:[id number] DOB: 1940-11-21 DOA: 04/11/2013  Referring physician: ER physician. PCP: Ginette Otto, MD   Chief Complaint: Abdominal pain and abnormal CT scan.  HPI: Wendy Erickson is a 72 y.o. female history of renal cell carcinoma status post left-sided nephrectomy last year, hypertension, depression was referred to ER after patient had abnormal CT abdomen and pelvis. Patient has been having abdominal pain in the right flank area for last 3-4 days. Patient also had been having subjective feeling of fever chills with nausea. Denies any vomiting or diarrhea. CT abdomen and pelvis was done which was showing possibility of obstruction in the right kidney and was referred to the ER. In the ER patient was found to be febrile and initially mildly hypotensive. Labs show elevated WBC count. On-call urologist Dr. Marlou Porch was consulted by the ER physician and at this time Dr. Marlou Porch does not feel patient had any renal obstruction but to treat for UTI and they will be following as consult. Patient's blood pressure improved with fluids. Patient denies any chest pain or shortness of breath.  Review of Systems: As presented in the history of presenting illness, rest negative.  Past Medical History  Diagnosis Date  . Hypertension   . Chronic kidney disease     left renla neoplasm  . Ataxia   . Depression   . Cancer     left renal neoplasm  . 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 Osteopenia    Past Surgical History  Procedure Laterality Date  . Tonsillectomy    . Abdominal hysterectomy    . Knee arthroscopy      bilateral   . Dilation and curettage of uterus    . Back surgery    . Laparoscopic nephrectomy  10/06/2011    Procedure: LAPAROSCOPIC NEPHRECTOMY;  Surgeon: Crecencio Mc, MD;  Location: [facility];  Service: Urology;  Laterality: Left;  LEFT LAPAROSCOPIC RADICAL NEPHRECTOMY     Social History:  reports that she quit smoking  about 7 years ago. She has never used smokeless tobacco. She reports that  drinks alcohol. She reports that she does not use illicit drugs.  Assisted living facility. where does patient live-- can do ADLs. Can patient participate in ADLs?  No Known Allergies  Family History  Problem Relation Age of Onset  . Ataxia Father       Prior to Admission medications   Medication Sig Start Date End Date Taking? Authorizing Provider  buPROPion (WELLBUTRIN XL) 150 MG 24 hr tablet Take 150 mg by mouth every morning.   Yes Historical Provider, MD  cholecalciferol (VITAMIN D) 1000 UNITS tablet Take 2,000 Units by mouth every morning.   Yes Historical Provider, MD  cloNIDine (CATAPRES) 0.1 MG tablet Take 0.1 mg by mouth daily.    Yes Historical Provider, MD  FLUoxetine HCl (PROZAC PO) Take 20 mg by mouth daily.    Yes Historical Provider, MD  gabapentin (NEURONTIN) 300 MG capsule Take 300 mg by mouth 2 (two) times daily.    Yes Historical Provider, MD  HYDROcodone-acetaminophen (NORCO/VICODIN) 5-325 MG per tablet Take 1 tablet by mouth every 4 (four) hours as needed for pain. 11/28/12  Yes Antony Madura, PA-C  omeprazole (PRILOSEC) 20 MG capsule Take 20 mg by mouth 2 (two) times daily.   Yes Historical Provider, MD  polycarbophil (FIBERCON) 625 MG tablet Take 625 mg by mouth daily.   Yes Historical Provider, MD  trandolapril (MAVIK) 1 MG tablet Take 1  mg by mouth 2 (two) times daily.   Yes Historical Provider, MD   Physical Exam: Filed Vitals:   04/11/13 2130 04/11/13 2200 04/11/13 2303 04/11/13 2315  BP: 135/58 115/54 117/53   Pulse: 94 89 85   Temp:    100.9 F (38.3 C)  TempSrc:    Rectal  Resp: 21 15 22    SpO2: 94% 97% 97%      General:   Well-developed and nourished.  Eyes:  anicteric no pallor.  ENT:  no discharge from ears eyes nose mouth.  Neck:  no mass felt.  Cardiovascular:  S1-S2 heard.  Respiratory:  no rhonchi or crepitations.  Abdomen:  soft nontender bowel sounds  present.  Skin:  no rash.  Musculoskeletal:  no edema.  Psychiatric:  appears normal.  Neurologic:  alert awake oriented to time place and person. Moves all extremities.  Labs on Admission:  Basic Metabolic Panel:  Recent Labs Lab 04/11/13 1854  NA 130*  K 4.4  CL 91*  CO2 22  GLUCOSE 78  BUN 42*  CREATININE 1.96*  CALCIUM 9.1   Liver Function Tests:  Recent Labs Lab 04/11/13 1854  AST 24  ALT 28  ALKPHOS 69  BILITOT 0.6  PROT 7.6  ALBUMIN 3.4*   No results found for this basename: LIPASE, AMYLASE,  in the last 168 hours No results found for this basename: AMMONIA,  in the last 168 hours CBC:  Recent Labs Lab 04/11/13 1854  WBC 21.7*  HGB 11.6*  HCT 32.9*  MCV 87.5  PLT 286   Cardiac Enzymes:  Recent Labs Lab 04/11/13 1854  TROPONINI <0.30    BNP (last 3 results) No results found for this basename: PROBNP,  in the last 8760 hours CBG: No results found for this basename: GLUCAP,  in the last 168 hours  Radiological Exams on Admission: Ct Abdomen Pelvis Wo Contrast  04/11/2013   *RADIOLOGY REPORT*  Clinical Data: History of left-sided renal cell carcinoma.  Right lower quadrant pain.  Elevated white count.  Question appendicitis versus diverticulitis.  Prior hysterectomy and both rectum. Significant change in renal function in the past 3 months.  GFR 20.  CT ABDOMEN AND PELVIS WITHOUT CONTRAST  Technique:  Multidetector CT imaging of the abdomen and pelvis was performed following the standard protocol without intravenous contrast.  Comparison: 10/30/2012 CT.  08/24/2011 MR.  Findings: Abnormal appearance of the right kidney.  This includes dilated right renal collecting system with an appearance of a right ureteral pelvic junction obstruction.  On the prior examination, vessel is seen crossing along the inferior aspect of the dilated right renal pelvis.  Additionally, interval development of 3.2 cm hemorrhagic appearing lesion inferior lateral aspect of  the right kidney.  This may represent hemorrhage into the previously noted cystic lesion. Infiltration of surrounding fat planes.  With a history of fever, superimposed infection is not excluded.  Urologic consultation is recommended.  No definitive bowel inflammatory process noted.  Appendix not clearly visualized and may been removed at the time of hysterectomy.  Liver lesions better delineated on the prior CT without gross change.  Splenic granulomas.  Trace pericardial fluid.  Right adrenal 2 cm lesion possibly an adenoma unchanged.  No left adrenal lesion.  No obvious pancreatic mass.  Prominent sized gallbladder without calcified gallstones.  Basilar subsegmental atelectasis versus scarring.  Trace pericardial effusion.  Coronary artery calcifications.  Atherosclerotic type changes of the aorta with mild ectasia. Atherosclerotic type changes iliac arteries with ectasia.  Distal left common iliac artery measures up to 1.6 cm.  Degenerative changes lumbar spine and lower thoracic spine without bony destructive lesion noted.  IMPRESSION: Abnormal appearance of the right kidney.  This includes dilated right renal collecting system with an appearance of a right ureteral pelvic junction obstruction.  On the prior examination, vessel is seen crossing along the inferior aspect of the dilated right renal pelvis.  Additionally, interval development of 3.2 cm hemorrhagic appearing lesion inferior lateral aspect of the right kidney.  This may represent hemorrhage into the previously noted cystic lesion. Infiltration of surrounding fat planes.  With a history of fever, superimposed infection is not excluded.  Urologic consultation is recommended.  No definitive bowel inflammatory process noted.  Appendix not clearly visualized and may been removed at the time of hysterectomy.  Critical Value/emergent results were called by telephone at the time of interpretation on 04/11/2013 at 2:40 p.m. to Levonne Lapping nurse  practitioner, who verbally acknowledged these results.   Original Report Authenticated By: Lacy Duverney, M.D.   Dg Chest Portable 1 View  04/11/2013   *RADIOLOGY REPORT*  Clinical Data: Shortness of breath, cough, fever  PORTABLE CHEST - 1 VIEW  Comparison: October 30, 2012  Findings: There is no focal infiltrate, pulmonary edema, or pleural effusion.  The previously noted 4 mm nodule in the right mid to lower lung is unchanged.  The mediastinal contour and cardiac silhouette are normal.  The soft tissues and osseous structures are stable.  IMPRESSION: No acute cardiopulmonary disease identified.   Original Report Authenticated By: Sherian Rein, M.D.     Assessment/Plan Principal Problem:   Pyelonephritis Active Problems:   AKI (acute kidney injury)   HTN (hypertension)   1. SIRS from pyelonephritis - patient has been placed on Zosyn for possible complicated UTI/pyelonephritis. Follow cultures. Continue IV fluids. Further recommendations per urologist regarding to abnormal findings in the right kidney in the CT. 2. Acute renal failure with solitary kidney - hold ARB. Continue with hydration. Closely follow intake output and metabolic panel. 3. Hypertension - holding off ARB due to acute renal failure and initially patient was mildly hypotensive. Continue clonidine for now. 4. History of depression - continue present medications. 5. History of renal cell carcinoma status post left-sided nephrectomy.    Code Status:  full code.  Family Communication:  none.  Disposition Plan:  admit to inpatient.    Vania Rosero N. [facility] Pager [phone].  If 7PM-7AM, please contact night-coverage [email or web address] Password [email or web address] 04/11/2013, 11:59 PM

## 2013-04-11 NOTE — ED Notes (Signed)
Rectal temp taken per request of RN due to pt being hot to touch. RN assisted this EMT with temp. Pts temp as noted.

## 2013-04-11 NOTE — ED Notes (Signed)
Pt.'s O2 varying from 94 to 77 and states it was that way at DR. appt today. Put on 4L of O2

## 2013-04-11 NOTE — Progress Notes (Signed)
Attempted to call and get report on patient x2

## 2013-04-12 DIAGNOSIS — N135 Crossing vessel and stricture of ureter without hydronephrosis: Secondary | ICD-10-CM

## 2013-04-12 DIAGNOSIS — N390 Urinary tract infection, site not specified: Secondary | ICD-10-CM

## 2013-04-12 LAB — BASIC METABOLIC PANEL
Chloride: 99 mEq/L (ref 96–112)
GFR calc Af Amer: 35 mL/min — ABNORMAL LOW (ref >90)
Potassium: 3.9 mEq/L (ref 3.5–5.1)
Sodium: 136 mEq/L (ref 135–145)

## 2013-04-12 LAB — CBC
HCT: 32.5 % — ABNORMAL LOW (ref 36.0–46.0)
Hemoglobin: 11.1 g/dL — ABNORMAL LOW (ref 12.0–15.0)
RBC: 3.68 MIL/uL — ABNORMAL LOW (ref 3.87–5.11)
RDW: 13.8 % (ref 11.5–15.5)
WBC: 19.6 10*3/uL — ABNORMAL HIGH (ref 4.0–10.5)

## 2013-04-12 LAB — ABO/RH: ABO/RH(D): AB POS

## 2013-04-12 MED ORDER — ONDANSETRON HCL 4 MG/2ML IJ SOLN: 4.0000 mg | Freq: Four times a day (QID) | INTRAMUSCULAR | Status: DC | PRN

## 2013-04-12 MED ORDER — HYDRALAZINE HCL 20 MG/ML IJ SOLN: 10.0000 mg | INTRAMUSCULAR | Status: DC | PRN

## 2013-04-12 MED ORDER — CALCIUM POLYCARBOPHIL 625 MG PO TABS
625.0000 mg | ORAL_TABLET | Freq: Every day | ORAL | Status: DC
  Administered 2013-04-12 – 2013-04-14 (×3): 625 mg via ORAL
  Administered 2013-04-15: 10:00:00 via ORAL
  Filled 2013-04-12 (×5): qty 1

## 2013-04-12 MED ORDER — PIPERACILLIN-TAZOBACTAM 3.375 G IVPB
3.3750 g | Freq: Three times a day (TID) | INTRAVENOUS | Status: DC
  Administered 2013-04-12 – 2013-04-15 (×9): 3.375 g via INTRAVENOUS
  Filled 2013-04-12 (×12): qty 50

## 2013-04-12 MED ORDER — CLONIDINE HCL 0.1 MG PO TABS
0.1000 mg | ORAL_TABLET | Freq: Every day | ORAL | Status: DC
  Filled 2013-04-12: qty 1

## 2013-04-12 MED ORDER — SODIUM CHLORIDE 0.9 % IJ SOLN
3.0000 mL | Freq: Two times a day (BID) | INTRAMUSCULAR | Status: DC
  Administered 2013-04-12 – 2013-04-13 (×3): 3 mL via INTRAVENOUS

## 2013-04-12 MED ORDER — PIPERACILLIN-TAZOBACTAM 3.375 G IVPB 30 MIN
3.3750 g | INTRAVENOUS | Status: AC
  Administered 2013-04-12: 3.375 g via INTRAVENOUS
  Filled 2013-04-12: qty 50

## 2013-04-12 MED ORDER — MORPHINE SULFATE 2 MG/ML IJ SOLN
1.0000 mg | INTRAMUSCULAR | Status: DC | PRN
  Administered 2013-04-12 – 2013-04-13 (×2): 1 mg via INTRAVENOUS
  Filled 2013-04-12 (×3): qty 1

## 2013-04-12 MED ORDER — ONDANSETRON HCL 4 MG PO TABS
4.0000 mg | ORAL_TABLET | Freq: Four times a day (QID) | ORAL | Status: DC | PRN
  Administered 2013-04-12: 4 mg via ORAL
  Filled 2013-04-12: qty 1

## 2013-04-12 MED ORDER — BUPROPION HCL ER (XL) 150 MG PO TB24
150.0000 mg | ORAL_TABLET | Freq: Every day | ORAL | Status: DC
  Administered 2013-04-13 – 2013-04-15 (×3): 150 mg via ORAL
  Filled 2013-04-12 (×3): qty 1

## 2013-04-12 MED ORDER — ACETAMINOPHEN 650 MG RE SUPP: 650.0000 mg | Freq: Four times a day (QID) | RECTAL | Status: DC | PRN

## 2013-04-12 MED ORDER — GABAPENTIN 300 MG PO CAPS
300.0000 mg | ORAL_CAPSULE | Freq: Two times a day (BID) | ORAL | Status: DC
  Administered 2013-04-12 – 2013-04-15 (×8): 300 mg via ORAL
  Filled 2013-04-12 (×10): qty 1

## 2013-04-12 MED ORDER — BUPROPION HCL ER (XL) 150 MG PO TB24
150.0000 mg | ORAL_TABLET | Freq: Every day | ORAL | Status: DC
  Filled 2013-04-12: qty 1

## 2013-04-12 MED ORDER — PANTOPRAZOLE SODIUM 40 MG PO TBEC
40.0000 mg | DELAYED_RELEASE_TABLET | Freq: Every day | ORAL | Status: DC
  Administered 2013-04-12 – 2013-04-15 (×4): 40 mg via ORAL
  Filled 2013-04-12 (×4): qty 1

## 2013-04-12 MED ORDER — ACETAMINOPHEN 325 MG PO TABS: 650.0000 mg | ORAL_TABLET | Freq: Four times a day (QID) | ORAL | Status: DC | PRN

## 2013-04-12 MED ORDER — SODIUM CHLORIDE 0.9 % IV SOLN
INTRAVENOUS | Status: DC
  Administered 2013-04-12 – 2013-04-13 (×4): via INTRAVENOUS

## 2013-04-12 MED ORDER — FLUOXETINE HCL 10 MG PO CAPS
10.0000 mg | ORAL_CAPSULE | Freq: Every day | ORAL | Status: DC
  Administered 2013-04-12 – 2013-04-15 (×4): 10 mg via ORAL
  Filled 2013-04-12 (×4): qty 1

## 2013-04-12 MED ORDER — SODIUM CHLORIDE 0.9 % IV SOLN
INTRAVENOUS | Status: DC
  Administered 2013-04-12: 02:00:00 via INTRAVENOUS

## 2013-04-12 NOTE — ED Notes (Signed)
Attempted to give report 

## 2013-04-12 NOTE — Progress Notes (Signed)
Pt tolerated OOB in chair/assist X 2. Continent of urine. Pain tolerable until transfers when pt reported a sharp acute onset abdominal pain. Morphine given once on day shift.Poor appetite, good fluid intake.

## 2013-04-12 NOTE — Progress Notes (Signed)
ANTIBIOTIC CONSULT NOTE - INITIAL  Pharmacy Consult for Zosyn Indication: pyelonephritis  No Known Allergies  Patient Measurements:    Vital Signs: Temp: 100.9 F (38.3 C) (09/04 2315) Temp src: Rectal (09/04 2315) BP: 115/53 mmHg (09/05 0000) Pulse Rate: 78 (09/05 0000) Intake/Output from previous day: 09/04 0701 - 09/05 0700 In: 250 [I.V.:250] Out: 700 [Urine:700] Intake/Output from this shift: Total I/O In: 250 [I.V.:250] Out: 700 [Urine:700]  Labs:  Recent Labs  04/11/13 1854  WBC 21.7*  HGB 11.6*  PLT 286  CREATININE 1.96*   The CrCl is unknown because both a height and weight (above a minimum accepted value) are required for this calculation. No results found for this basename: VANCOTROUGH, VANCOPEAK, VANCORANDOM, GENTTROUGH, GENTPEAK, GENTRANDOM, TOBRATROUGH, TOBRAPEAK, TOBRARND, AMIKACINPEAK, AMIKACINTROU, AMIKACIN,  in the last 72 hours   Microbiology: No results found for this or any previous visit (from the past 720 hour(s)).  Medical History: Past Medical History  Diagnosis Date  . Hypertension   . Chronic kidney disease     left renla neoplasm  . Ataxia   . Depression   . Cancer     left renal neoplasm  . 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 Osteopenia     Medications:  See electronic med rec  Assessment: 72 y.o. female presents with abd pain, fever, chills. WBC elevated. To begin Zosyn for pyelonephritis. Est CrCl 30 ml/min. Cultures pending.  Goal of Therapy:  Eradication of infection  Plan:  1.Zosyn 3.375gm IV now over 30 min then 3.375gm IV q8h - subsequent doses over 4 hours 2. Will f/u renal function, microbiological data, pt's clinical condition  Christoper Fabian, PharmD, BCPS Clinical pharmacist, pager [phone] 04/12/2013,1:14 AM

## 2013-04-12 NOTE — Progress Notes (Signed)
Utilization Review Completed.  

## 2013-04-12 NOTE — Consult Note (Signed)
Cc: We have been asked to see Wendy Erickson by the hospitalist service for evaluation and management recommendations regarding possible infected left renal collecting system construction and finally of hemorrhage pain she right lower pole cyst.  History of present illness: Patient is a 72 year old female with a history of renal cell carcinoma status post left nephrectomy, who presents with 3 days of worsening right sided abdominal pain, fever,  and diarrhea. Patient states her pain has progressively gotten worse of the last several days. She complains of right sided abdominal pain which is constant, relieved by sitting still or sleeping, and worse with palpation or movement. She denies any right flank pain. Pain does not appear to be associated with eating. Patient states she has had very loose stools for the past 5 or 6 days. She feels that she's been unable to keep up with her fluid loss and has been dehydrated.  Patient presented to hospital with a fever of 102 Fahrenheit, white blood cell count 21,000, creatinine 1.93 (up from 1.3). CT scan was obtained showing dilated right renal pelvis in Chinese consistent with hemorrhage into the done right renal cysts. Since admission the patient has received IV fluids and antibiotics. She states that she has been improving and feeling better. However, she is not sure if her pain is getting better. The time of her presentation to the ER she was unable to void, and was catheterized in the ER. Since then she is unable to put her on her urine is foul smelling.  Past Medical History  Diagnosis Date  . Hypertension   . Chronic kidney disease     left renla neoplasm  . Ataxia   . Depression   . Cancer     left renal neoplasm  . 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 Osteopenia    Past Surgical History  Procedure Laterality Date  . Tonsillectomy    . Abdominal hysterectomy    . Knee arthroscopy      bilateral   . Dilation and curettage of uterus    . Back  surgery    . Laparoscopic nephrectomy  10/06/2011    Procedure: LAPAROSCOPIC NEPHRECTOMY;  Surgeon: Crecencio Mc, MD;  Location: [facility];  Service: Urology;  Laterality: Left;  LEFT LAPAROSCOPIC RADICAL NEPHRECTOMY     BP 120/52  Pulse 83  Temp(Src) 98.5 F (36.9 C) (Oral)  Resp 10  Ht 5\' 3"  (1.6 m)  Wt 74.1 kg (163 lb 5.8 oz)  BMI 28.95 kg/m2  SpO2 95%  General Appearance:    Alert, cooperative, no distress, appears stated age  Head:    Normocephalic, without obvious abnormality, atraumatic  Eyes:    PERRL, conjunctiva/corneas clear, EOM's intact, fundi    benign, both eyes  Lungs:     respirations unlabored  Breast Exam:    No tenderness, masses, or nipple abnormality  Abdomen:     Soft, extremely tender on right side to gentle palpation, left side is non-tender.  She has right sided CVA tenderness.  No rebound, but she is guarding slightly.  no masses, no organomegaly appreciated  Extremities:   Extremities normal, atraumatic, no cyanosis or edema  Pulses:   2+ and symmetric all extremities  Skin:   Skin color, texture, turgor normal, no rashes or lesions  Neurologic:   Dysarthric, essential tremor, no fine motor dexterity.      Results for Wendy Erickson, Wendy Erickson (MRN [id number]) as of 04/12/2013 17:40  Ref. Range 04/11/2013 18:54 04/12/2013 05:35  WBC Latest  Range: 4.0-10.5 K/uL 21.7 (H) 19.6 (H)  RBC Latest Range: 3.87-5.11 MIL/uL 3.76 (L) 3.68 (L)  Hemoglobin Latest Range: 12.0-15.0 g/dL 45.4 (L) 09.8 (L)  HCT Latest Range: 36.0-46.0 % 32.9 (L) 32.5 (L)  MCV Latest Range: 78.0-100.0 fL 87.5 88.3  MCH Latest Range: 26.0-34.0 pg 30.9 30.2  MCHC Latest Range: 30.0-36.0 g/dL 11.9 14.7  RDW Latest Range: 11.5-15.5 % 13.6 13.8  Platelets Latest Range: 150-400 K/uL 286 248    Results for Wendy Erickson, Wendy Erickson (MRN [id number]) as of 04/12/2013 17:40  Ref. Range 04/11/2013 18:54 04/12/2013 05:35  Sodium Latest Range: 135-145 mEq/L 130 (L) 136  Potassium Latest Range: 3.5-5.1 mEq/L 4.4 3.9  Chloride Latest  Range: 96-112 mEq/L 91 (L) 99  CO2 Latest Range: 19-32 mEq/L 22 19  BUN Latest Range: 6-23 mg/dL 42 (H) 37 (H)  Creatinine Latest Range: 0.50-1.10 mg/dL 8.65 (H) 7.84 (H)  Calcium Latest Range: 8.4-10.5 mg/dL 9.1 8.6  GFR calc non Af Amer Latest Range: >90 mL/min 25 (L) 30 (L)  GFR calc Af Amer Latest Range: >90 mL/min 29 (L) 35 (L)  Glucose Latest Range: 70-99 mg/dL 78 57 (L)    CT - non contrast Ab/Pelv:  Abnormal appearance of the right kidney. This includes dilated right renal collecting system with an appearance of a right ureteral pelvic junction obstruction. On the prior examination, vessel is seen crossing along the inferior aspect of the dilated right renal pelvis. Additionally, interval development of 3.2 cm hemorrhagic appearing lesion inferior lateral aspect of the right kidney. This may represent hemorrhage into the previously noted cystic lesion. Infiltration of surrounding fat planes. With a history of fever, superimposed infection is not excluded.  Imp: Wendy Erickson is a pleasant 72 year-old female who likely developed a urinary tract infection and subsequent pyelonephritis which was triggered by her recent episode of diarrhea, immobility and related poor hygiene.  Her infection may have resulted in her bleed into the cyst of the lower pole of her kidney.  the patient has an extrarenal pelvis which has been present on multiple imaging studies relating back to several years, and I don't think that she is obstructed. She is making more urine and appears to be getting better clinically with IV fluid, IV antibiotics and pain medication.   Recommendations: Given her clinical improvement with hydration and antibiotics would recommend continuing current regimen. Recommend narrowing her antibiotic coverage once her cultures have returned, continuing with aggressive IV fluids, and trending her white blood cell count, BUN and creatinine, and abdominal exam. She should be treated with antibiotics for  a minimum of 2 weeks. Ultimately she will need to be reimaged and she is already scheduled for followup with Dr. Laverle Patter in mid October with a CT scan prior.  If the patient fails to improve, would consider a Lasix renogram to rule out obstruction.  Thank you for involving [place] in this patient's care. We'll continue to follow. Please contact the on-call urologist for any questions or concerns.

## 2013-04-12 NOTE — Progress Notes (Signed)
[facility] PROGRESS NOTE  Deepa Barthel AOZ:[id number] DOB: Dec 26, 1940 DOA: 04/11/2013 PCP: Ginette Otto, MD  Assessment/Plan: 1-SIRS// pyelonephritis - Continue with Zosyn for possible complicated UTI/pyelonephritis. Follow urine cultures. Continue IV fluids. Urology consulted. WBC trending down. FU blood cultures.   2- Acute on Chronic Renal Failure stage 2: - Cr baseline 1.2 to 1.3. Renal function improved. Continue to hold ARB. Continue with IV fluids. Cr peak to 1.9, has decrease to 1.6.   3-Hypertension - holding  ARB due to acute renal failure. Hold clonidine in setting of infection. PRN hydralazine.  4-History of depression - continue present medications.  5-History of renal cell carcinoma status post left-sided nephrectomy 6-Abdominal pain: likely related to right kidney finding.  7-DVT prophylaxis: SCD. Hold on anticoagulation due to ? Hemorrhagic lesion on right kidney.  8-? Ureteral junction obstruction, 3.2 Cm hemorraghic appearing lesion inferior aspect right kidney: Urology consulted.   Code Status: Full Code.  Family Communication: none at bedside.  Disposition Plan: Remain inpatient.    Consultants:  Dr Marlou Porch, urologist.   Procedures:  none  Antibiotics:  Zosyn 9-04  HPI/Subjective: Complaining of right side abdominal pain. Had a good night, was able to sleep.   Objective: Filed Vitals:   04/12/13 0402  BP: 150/43  Pulse: 82  Temp: 98 F (36.7 C)  Resp: 15    Intake/Output Summary (Last 24 hours) at 04/12/13 0728 Last data filed at 04/12/13 0500  Gross per 24 hour  Intake 647.92 ml  Output   1827 ml  Net -1179.08 ml   Filed Weights   04/12/13 0203  Weight: 74.1 kg (163 lb 5.8 oz)    Exam:   General:  No distress.   Cardiovascular: S 1, S 2 RRR  Respiratory: CTA  Abdomen: Bs present, soft, mild right side tenderness.   Musculoskeletal: no edema.   Data Reviewed: Basic Metabolic Panel:  Recent Labs Lab  04/11/13 1854 04/12/13 0535  NA 130* 136  K 4.4 3.9  CL 91* 99  CO2 22 19  GLUCOSE 78 57*  BUN 42* 37*  CREATININE 1.96* 1.69*  CALCIUM 9.1 8.6   Liver Function Tests:  Recent Labs Lab 04/11/13 1854  AST 24  ALT 28  ALKPHOS 69  BILITOT 0.6  PROT 7.6  ALBUMIN 3.4*   No results found for this basename: LIPASE, AMYLASE,  in the last 168 hours No results found for this basename: AMMONIA,  in the last 168 hours CBC:  Recent Labs Lab 04/11/13 1854 04/12/13 0535  WBC 21.7* 19.6*  HGB 11.6* 11.1*  HCT 32.9* 32.5*  MCV 87.5 88.3  PLT 286 248   Cardiac Enzymes:  Recent Labs Lab 04/11/13 1854  TROPONINI <0.30   BNP (last 3 results) No results found for this basename: PROBNP,  in the last 8760 hours CBG: No results found for this basename: GLUCAP,  in the last 168 hours  Recent Results (from the past 240 hour(s))  MRSA PCR SCREENING     Status: None   Collection Time    04/12/13  3:00 AM      Result Value Range Status   MRSA by PCR NEGATIVE  NEGATIVE Final   Comment:            The GeneXpert MRSA Assay (FDA     approved for NASAL specimens     only), is one component of a     comprehensive MRSA colonization     surveillance program. It is not  intended to diagnose MRSA     infection nor to guide or     monitor treatment for     MRSA infections.     Studies: Ct Abdomen Pelvis Wo Contrast  04/11/2013   *RADIOLOGY REPORT*  Clinical Data: History of left-sided renal cell carcinoma.  Right lower quadrant pain.  Elevated white count.  Question appendicitis versus diverticulitis.  Prior hysterectomy and both rectum. Significant change in renal function in the past 3 months.  GFR 20.  CT ABDOMEN AND PELVIS WITHOUT CONTRAST  Technique:  Multidetector CT imaging of the abdomen and pelvis was performed following the standard protocol without intravenous contrast.  Comparison: 10/30/2012 CT.  08/24/2011 MR.  Findings: Abnormal appearance of the right kidney.  This  includes dilated right renal collecting system with an appearance of a right ureteral pelvic junction obstruction.  On the prior examination, vessel is seen crossing along the inferior aspect of the dilated right renal pelvis.  Additionally, interval development of 3.2 cm hemorrhagic appearing lesion inferior lateral aspect of the right kidney.  This may represent hemorrhage into the previously noted cystic lesion. Infiltration of surrounding fat planes.  With a history of fever, superimposed infection is not excluded.  Urologic consultation is recommended.  No definitive bowel inflammatory process noted.  Appendix not clearly visualized and may been removed at the time of hysterectomy.  Liver lesions better delineated on the prior CT without gross change.  Splenic granulomas.  Trace pericardial fluid.  Right adrenal 2 cm lesion possibly an adenoma unchanged.  No left adrenal lesion.  No obvious pancreatic mass.  Prominent sized gallbladder without calcified gallstones.  Basilar subsegmental atelectasis versus scarring.  Trace pericardial effusion.  Coronary artery calcifications.  Atherosclerotic type changes of the aorta with mild ectasia. Atherosclerotic type changes iliac arteries with ectasia.  Distal left common iliac artery measures up to 1.6 cm.  Degenerative changes lumbar spine and lower thoracic spine without bony destructive lesion noted.  IMPRESSION: Abnormal appearance of the right kidney.  This includes dilated right renal collecting system with an appearance of a right ureteral pelvic junction obstruction.  On the prior examination, vessel is seen crossing along the inferior aspect of the dilated right renal pelvis.  Additionally, interval development of 3.2 cm hemorrhagic appearing lesion inferior lateral aspect of the right kidney.  This may represent hemorrhage into the previously noted cystic lesion. Infiltration of surrounding fat planes.  With a history of fever, superimposed infection is not  excluded.  Urologic consultation is recommended.  No definitive bowel inflammatory process noted.  Appendix not clearly visualized and may been removed at the time of hysterectomy.  Critical Value/emergent results were called by telephone at the time of interpretation on 04/11/2013 at 2:40 p.m. to Levonne Lapping nurse practitioner, who verbally acknowledged these results.   Original Report Authenticated By: Lacy Duverney, M.D.   Dg Chest Portable 1 View  04/11/2013   *RADIOLOGY REPORT*  Clinical Data: Shortness of breath, cough, fever  PORTABLE CHEST - 1 VIEW  Comparison: October 30, 2012  Findings: There is no focal infiltrate, pulmonary edema, or pleural effusion.  The previously noted 4 mm nodule in the right mid to lower lung is unchanged.  The mediastinal contour and cardiac silhouette are normal.  The soft tissues and osseous structures are stable.  IMPRESSION: No acute cardiopulmonary disease identified.   Original Report Authenticated By: Sherian Rein, M.D.    Scheduled Meds: . buPROPion  150 mg Oral Daily  . cloNIDine  0.1  mg Oral Daily  . FLUoxetine  10 mg Oral Daily  . gabapentin  300 mg Oral BID  . pantoprazole  40 mg Oral Daily  . piperacillin-tazobactam (ZOSYN)  IV  3.375 g Intravenous Q8H  . polycarbophil  625 mg Oral Q breakfast  . sodium chloride  3 mL Intravenous Q12H   Continuous Infusions: . sodium chloride 125 mL/hr at 04/12/13 0213    Principal Problem:   Pyelonephritis Active Problems:   AKI (acute kidney injury)   HTN (hypertension)    Time spent: 35 minutes.    Durell Lofaso  [facility] Pager [phone]. If 7PM-7AM, please contact night-coverage at [email or web address], password [email or web address] 04/12/2013, 7:28 AM  LOS: 1 day

## 2013-04-12 NOTE — ED Notes (Signed)
Pt's bed assignment changed.

## 2013-04-13 DIAGNOSIS — Z905 Acquired absence of kidney: Secondary | ICD-10-CM

## 2013-04-13 DIAGNOSIS — N1330 Unspecified hydronephrosis: Secondary | ICD-10-CM | POA: Diagnosis present

## 2013-04-13 DIAGNOSIS — R509 Fever, unspecified: Secondary | ICD-10-CM

## 2013-04-13 LAB — BASIC METABOLIC PANEL
BUN: 30 mg/dL — ABNORMAL HIGH (ref 6–23)
CO2: 18 mEq/L — ABNORMAL LOW (ref 19–32)
Calcium: 7.8 mg/dL — ABNORMAL LOW (ref 8.4–10.5)
Creatinine, Ser: 1.4 mg/dL — ABNORMAL HIGH (ref 0.50–1.10)
Glucose, Bld: 108 mg/dL — ABNORMAL HIGH (ref 70–99)

## 2013-04-13 LAB — URINE CULTURE

## 2013-04-13 LAB — CBC
MCH: 30.1 pg (ref 26.0–34.0)
MCV: 89.6 fL (ref 78.0–100.0)
Platelets: 266 10*3/uL (ref 150–400)
RBC: 2.99 MIL/uL — ABNORMAL LOW (ref 3.87–5.11)

## 2013-04-13 MED ORDER — BIOTENE DRY MOUTH MT LIQD
15.0000 mL | Freq: Two times a day (BID) | OROMUCOSAL | Status: DC
  Administered 2013-04-13 – 2013-04-14 (×4): 15 mL via OROMUCOSAL

## 2013-04-13 MED ORDER — OXYCODONE HCL 5 MG PO TABS
5.0000 mg | ORAL_TABLET | ORAL | Status: DC | PRN
  Administered 2013-04-14: 10 mg via ORAL
  Filled 2013-04-13: qty 2

## 2013-04-13 MED ORDER — CLONIDINE HCL 0.1 MG PO TABS
0.1000 mg | ORAL_TABLET | Freq: Every day | ORAL | Status: DC
  Administered 2013-04-13 – 2013-04-15 (×3): 0.1 mg via ORAL
  Filled 2013-04-13 (×3): qty 1

## 2013-04-13 MED ORDER — MORPHINE SULFATE 2 MG/ML IJ SOLN
1.0000 mg | INTRAMUSCULAR | Status: DC | PRN
  Administered 2013-04-13 (×2): 2 mg via INTRAVENOUS
  Filled 2013-04-13 (×2): qty 1

## 2013-04-13 NOTE — Progress Notes (Signed)
Patient ID: Wendy Erickson, female   DOB: 1940-12-16, 72 y.o.   MRN: [id number]     Subjective: Wendy Erickson is feeling better and producing more urine.   She continues to have some right flank pain.  Her WBC and Cr continue to improve.    Her urine culture was negative and her blood cultures are pending.   She did have a fever spike to 101.7 in the night but is afebrile now and has no tachycardia.  ROS: Negative except as  above  Objective: Vital signs in last 24 hours: Temp:  [98.5 F (36.9 C)-101.7 F (38.7 C)] 99 F (37.2 C) (09/06 0800) Pulse Rate:  [73-90] 78 (09/06 1000) Resp:  [10-29] 16 (09/06 1000) BP: (119-146)/(35-64) 146/64 mmHg (09/06 0800) SpO2:  [93 %-98 %] 98 % (09/06 1000) Weight:  [77.1 kg (169 lb 15.6 oz)] 77.1 kg (169 lb 15.6 oz) (09/06 0500)  Intake/Output from previous day: 09/05 0701 - 09/06 0700 In: 2145 [P.O.:1120; I.V.:975; IV Piggyback:50] Out: 700 [Urine:700] Intake/Output this shift: Total I/O In: 785 [P.O.:360; I.V.:375; IV Piggyback:50] Out: 400 [Urine:400]  General appearance: alert and no distress GI: She continues to have RUQ and flank tenderness, but her abdomen is soft.    Lab Results:   Recent Labs  04/12/13 0535 04/13/13 0400  WBC 19.6* 13.9*  HGB 11.1* 9.0*  HCT 32.5* 26.8*  PLT 248 266   BMET  Recent Labs  04/12/13 0535 04/13/13 0400  NA 136 138  K 3.9 4.0  CL 99 106  CO2 19 18*  GLUCOSE 57* 108*  BUN 37* 30*  CREATININE 1.69* 1.40*  CALCIUM 8.6 7.8*   PT/INR  Recent Labs  04/11/13 1854  LABPROT 15.2  INR 1.23   ABG No results found for this basename: PHART, PCO2, PO2, HCO3,  in the last 72 hours  Studies/Results: Ct Abdomen Pelvis Wo Contrast  04/11/2013   *RADIOLOGY REPORT*  Clinical Data: History of left-sided renal cell carcinoma.  Right lower quadrant pain.  Elevated white count.  Question appendicitis versus diverticulitis.  Prior hysterectomy and both rectum. Significant change in renal function in the  past 3 months.  GFR 20.  CT ABDOMEN AND PELVIS WITHOUT CONTRAST  Technique:  Multidetector CT imaging of the abdomen and pelvis was performed following the standard protocol without intravenous contrast.  Comparison: 10/30/2012 CT.  08/24/2011 MR.  Findings: Abnormal appearance of the right kidney.  This includes dilated right renal collecting system with an appearance of a right ureteral pelvic junction obstruction.  On the prior examination, vessel is seen crossing along the inferior aspect of the dilated right renal pelvis.  Additionally, interval development of 3.2 cm hemorrhagic appearing lesion inferior lateral aspect of the right kidney.  This may represent hemorrhage into the previously noted cystic lesion. Infiltration of surrounding fat planes.  With a history of fever, superimposed infection is not excluded.  Urologic consultation is recommended.  No definitive bowel inflammatory process noted.  Appendix not clearly visualized and may been removed at the time of hysterectomy.  Liver lesions better delineated on the prior CT without gross change.  Splenic granulomas.  Trace pericardial fluid.  Right adrenal 2 cm lesion possibly an adenoma unchanged.  No left adrenal lesion.  No obvious pancreatic mass.  Prominent sized gallbladder without calcified gallstones.  Basilar subsegmental atelectasis versus scarring.  Trace pericardial effusion.  Coronary artery calcifications.  Atherosclerotic type changes of the aorta with mild ectasia. Atherosclerotic type changes iliac arteries with ectasia.  Distal left common iliac artery measures up to 1.6 cm.  Degenerative changes lumbar spine and lower thoracic spine without bony destructive lesion noted.  IMPRESSION: Abnormal appearance of the right kidney.  This includes dilated right renal collecting system with an appearance of a right ureteral pelvic junction obstruction.  On the prior examination, vessel is seen crossing along the inferior aspect of the dilated  right renal pelvis.  Additionally, interval development of 3.2 cm hemorrhagic appearing lesion inferior lateral aspect of the right kidney.  This may represent hemorrhage into the previously noted cystic lesion. Infiltration of surrounding fat planes.  With a history of fever, superimposed infection is not excluded.  Urologic consultation is recommended.  No definitive bowel inflammatory process noted.  Appendix not clearly visualized and may been removed at the time of hysterectomy.  Critical Value/emergent results were called by telephone at the time of interpretation on 04/11/2013 at 2:40 p.m. to Levonne Lapping nurse practitioner, who verbally acknowledged these results.   Original Report Authenticated By: Lacy Duverney, M.D.   Dg Chest Portable 1 View  04/11/2013   *RADIOLOGY REPORT*  Clinical Data: Shortness of breath, cough, fever  PORTABLE CHEST - 1 VIEW  Comparison: October 30, 2012  Findings: There is no focal infiltrate, pulmonary edema, or pleural effusion.  The previously noted 4 mm nodule in the right mid to lower lung is unchanged.  The mediastinal contour and cardiac silhouette are normal.  The soft tissues and osseous structures are stable.  IMPRESSION: No acute cardiopulmonary disease identified.   Original Report Authenticated By: Sherian Rein, M.D.    Anti-infectives: Anti-infectives   Start     Dose/Rate Route Frequency Ordered Stop   04/12/13 1000  piperacillin-tazobactam (ZOSYN) IVPB 3.375 g     3.375 g 12.5 mL/hr over 240 Minutes Intravenous Every 8 hours 04/12/13 0204     04/12/13 0130  piperacillin-tazobactam (ZOSYN) IVPB 3.375 g     3.375 g 100 mL/hr over 30 Minutes Intravenous NOW 04/12/13 0119 04/12/13 0323   04/11/13 1900  cefTRIAXone (ROCEPHIN) 2 g in dextrose 5 % 50 mL IVPB     2 g 100 mL/hr over 30 Minutes Intravenous  Once 04/11/13 1855 04/11/13 2211      Current Facility-Administered Medications  Medication Dose Route Frequency Provider Last Rate Last Dose  . 0.9 %   sodium chloride infusion   Intravenous Continuous Lonia Blood, MD 125 mL/hr at 04/13/13 [id number]    . acetaminophen (TYLENOL) tablet 650 mg  650 mg Oral Q6H PRN Eduard Clos, MD       Or  . acetaminophen (TYLENOL) suppository 650 mg  650 mg Rectal Q6H PRN Eduard Clos, MD      . antiseptic oral rinse (BIOTENE) solution 15 mL  15 mL Mouth Rinse BID Lonia Blood, MD      . buPROPion (WELLBUTRIN XL) 24 hr tablet 150 mg  150 mg Oral Daily Belkys A Regalado, MD   150 mg at 04/13/13 0914  . cloNIDine (CATAPRES) tablet 0.1 mg  0.1 mg Oral Daily Lonia Blood, MD      . FLUoxetine (PROZAC) capsule 10 mg  10 mg Oral Daily Eduard Clos, MD   10 mg at 04/13/13 0914  . gabapentin (NEURONTIN) capsule 300 mg  300 mg Oral BID Eduard Clos, MD   300 mg at 04/13/13 0914  . hydrALAZINE (APRESOLINE) injection 10 mg  10 mg Intravenous Q4H PRN Eduard Clos, MD      .  morphine 2 MG/ML injection 1-2 mg  1-2 mg Intravenous Q4H PRN Lonia Blood, MD      . ondansetron [facility]) tablet 4 mg  4 mg Oral Q6H PRN Eduard Clos, MD   4 mg at 04/12/13 0911   Or  . ondansetron (ZOFRAN) injection 4 mg  4 mg Intravenous Q6H PRN Eduard Clos, MD      . oxyCODONE (Oxy IR/ROXICODONE) immediate release tablet 5-10 mg  5-10 mg Oral Q4H PRN Lonia Blood, MD      . pantoprazole (PROTONIX) EC tablet 40 mg  40 mg Oral Daily Eduard Clos, MD   40 mg at 04/13/13 0914  . piperacillin-tazobactam (ZOSYN) IVPB 3.375 g  3.375 g Intravenous Q8H Hilario Quarry Baring, RPH   3.375 g at 04/13/13 0915  . polycarbophil (FIBERCON) tablet 625 mg  625 mg Oral Q breakfast Eduard Clos, MD   625 mg at 04/13/13 0710  . sodium chloride 0.9 % injection 3 mL  3 mL Intravenous Q12H Eduard Clos, MD   3 mL at 04/13/13 0915    Assessment  Solitary right kidney with partial obstruction now with pyelo. Her labs are improving and with the falling Creatinine and increased urine  output significant obstruction of her solitary kidney is unlikely.  Her Hgb is down to 9 which may be dilutional, but with the hemorrhagic cyst, repeat CT should be considered, possibly with contrast, if the decline continues. Plan: Continue current care but consider CT if hgb continues to decline. A lasix renogram might be off value to assess the degree of obstruction of her right kidney.     LOS: 2 days    Anner Crete 04/13/2013

## 2013-04-13 NOTE — Progress Notes (Signed)
[facility] Progress Note [place] TEAM 1 - Stepdown/ICU TEAM   Wendy Erickson WUJ:[id number] DOB: 12-Feb-1941 DOA: 04/11/2013 PCP: Ginette Otto, MD  Admit HPI / Brief Narrative: 72 y.o. female w/ history of renal cell carcinoma status post left-sided nephrectomy 2013, hypertension, and depression who was referred to ER after patient had abnormal CT abdomen and pelvis. Patient had been having abdominal pain in the right flank area for 3-4 days. Patient also had been having subjective feeling of fever chills with nausea. Denied any vomiting or diarrhea. CT abdomen and pelvis revealed the possibility of obstruction in the right kidney and pt was referred to the ER. In the ER patient was found to be febrile and initially mildly hypotensive. Labs show elevated WBC count. On-call urologist Dr. Marlou Porch was consulted by the ER physician.  Dr. Marlou Porch reportedly did not feel patient had a true renal obstruction and suggested to treat for UTI. Patient's blood pressure improved with fluids.   Assessment/Plan:  Sepsis due to Pyelonephritis (temp 102 + WBC 21) Fever curve trending down - hemodynamically stable - WBC count improving - Urology suggests 14 day minimum course of abx tx - urine cx has not proven to be helpful - blood cx are still pending - will narrow abx asap - sepsis physiology is resolving   Acute Kidney Failure in CKD - solitary kidney  Cr baseline 1.2 to 1.3 - crt peaked at 1.9 - renal function steadily improving   ? Ureteral junction obstruction 3.2 cm hemorraghic appearing lesion inferior aspect right kidney - care as per Urology - likely hemorrhage into known renal cyst - for outpt f/u unless complications arise during this admit   HTN Reasonably controlled at this time   Hx of Renal Cell CA - s/p L nephrectomy 2013  Code Status: FULL Family Communication: no family present at time of exam Disposition Plan: stable for transfer to medical bed - follow blood cx results -  follow renal fucntion - follow Hgb   Consultants: Urology   Procedures: none  Antibiotics: Zosyn 9/4 >>  DVT prophylaxis: SCDs  HPI/Subjective: The pt is alert and conversant.  She c/o some ongoing R flank pain.  She denies cp, n/v, sob, or f/c.  Objective: Blood pressure 142/59, pulse 86, temperature 100 F (37.8 C), temperature source Oral, resp. rate 17, height 5\' 3"  (1.6 m), weight 77.1 kg (169 lb 15.6 oz), SpO2 93.00%.  Intake/Output Summary (Last 24 hours) at 04/13/13 1018 Last data filed at 04/12/13 1800  Gross per 24 hour  Intake  942.5 ml  Output    700 ml  Net  242.5 ml   Exam: General: No acute respiratory distress Lungs: Clear to auscultation bilaterally without wheezes or crackles Cardiovascular: Regular rate and rhythm without murmur gallop or rub normal S1 and S2 Abdomen: Nontender, nondistended, soft, bowel sounds positive, no rebound, no ascites, no appreciable mass - R flank/CVA tenderness noted  Extremities: No significant cyanosis, clubbing, or edema bilateral lower extremities  Data Reviewed: Basic Metabolic Panel:  Recent Labs Lab 04/11/13 1854 04/12/13 0535 04/13/13 0400  NA 130* 136 138  K 4.4 3.9 4.0  CL 91* 99 106  CO2 22 19 18*  GLUCOSE 78 57* 108*  BUN 42* 37* 30*  CREATININE 1.96* 1.69* 1.40*  CALCIUM 9.1 8.6 7.8*   Liver Function Tests:  Recent Labs Lab 04/11/13 1854  AST 24  ALT 28  ALKPHOS 69  BILITOT 0.6  PROT 7.6  ALBUMIN 3.4*   CBC:  Recent Labs Lab 04/11/13 1854 04/12/13 0535 04/13/13 0400  WBC 21.7* 19.6* 13.9*  HGB 11.6* 11.1* 9.0*  HCT 32.9* 32.5* 26.8*  MCV 87.5 88.3 89.6  PLT 286 248 266   Cardiac Enzymes:  Recent Labs Lab 04/11/13 1854  TROPONINI <0.30     Recent Results (from the past 240 hour(s))  URINE CULTURE     Status: None   Collection Time    04/11/13  8:36 PM      Result Value Range Status   Specimen Description URINE, RANDOM   Final   Special Requests NONE   Final   Culture   Setup Time     Final   Value: 04/11/2013 22:17     Performed at [facility] Count     Final   Value: NO GROWTH     Performed at [facility]   Culture     Final   Value: NO GROWTH     Performed at [facility]   Report Status 04/13/2013 FINAL   Final  MRSA PCR SCREENING     Status: None   Collection Time    04/12/13  3:00 AM      Result Value Range Status   MRSA by PCR NEGATIVE  NEGATIVE Final   Comment:            The GeneXpert MRSA Assay (FDA     approved for NASAL specimens     only), is one component of a     comprehensive MRSA colonization     surveillance program. It is not     intended to diagnose MRSA     infection nor to guide or     monitor treatment for     MRSA infections.     Studies:  Recent x-ray studies have been reviewed in detail by the Attending Physician  Scheduled Meds:  Scheduled Meds: . buPROPion  150 mg Oral Daily  . FLUoxetine  10 mg Oral Daily  . gabapentin  300 mg Oral BID  . pantoprazole  40 mg Oral Daily  . piperacillin-tazobactam (ZOSYN)  IV  3.375 g Intravenous Q8H  . polycarbophil  625 mg Oral Q breakfast  . sodium chloride  3 mL Intravenous Q12H    Time spent on care of this patient: 35 mins   [facility] T  [facility] Office  [phone] Pager - Text Page per Loretha Stapler as per below:  On-Call/Text Page:      [email or web address]      password [email or web address]  If 7PM-7AM, please contact night-coverage [email or web address] Password [email or web address] 04/13/2013, 10:18 AM   LOS: 2 days

## 2013-04-14 ENCOUNTER — Inpatient Hospital Stay (HOSPITAL_COMMUNITY): Payer: Medicare Other

## 2013-04-14 DIAGNOSIS — Z905 Acquired absence of kidney: Secondary | ICD-10-CM

## 2013-04-14 LAB — BASIC METABOLIC PANEL
BUN: 21 mg/dL (ref 6–23)
Calcium: 8.6 mg/dL (ref 8.4–10.5)
Creatinine, Ser: 1.21 mg/dL — ABNORMAL HIGH (ref 0.50–1.10)
GFR calc non Af Amer: 45 mL/min — ABNORMAL LOW (ref >90)
Glucose, Bld: 103 mg/dL — ABNORMAL HIGH (ref 70–99)
Sodium: 137 mEq/L (ref 135–145)

## 2013-04-14 LAB — CBC
MCH: 31 pg (ref 26.0–34.0)
MCHC: 34.4 g/dL (ref 30.0–36.0)
Platelets: 261 10*3/uL (ref 150–400)
RDW: 13.8 % (ref 11.5–15.5)

## 2013-04-14 MED ORDER — IOHEXOL 300 MG/ML  SOLN
75.0000 mL | Freq: Once | INTRAMUSCULAR | Status: AC | PRN
  Administered 2013-04-14: 75 mL via INTRAVENOUS

## 2013-04-14 NOTE — Evaluation (Signed)
Physical Therapy Evaluation Patient Details Name: Wendy Erickson MRN: [id number] DOB: 1941-07-09 Today's Date: 04/14/2013 Time: 6962-9528 PT Time Calculation (min): 27 min  PT Assessment / Plan / Recommendation History of Present Illness  72 y.o. female w/ history of renal cell carcinoma status post left-sided nephrectomy 2013, hypertension, and depression who was referred to ER after patient had abnormal CT abdomen and pelvis. Patient had been having abdominal pain in the right flank area for 3-4 days. Patient also had been having subjective feeling of fever chills with nausea. Denied any vomiting or diarrhea. CT abdomen and pelvis revealed the possibility of obstruction in the right kidney and pt was referred to the ER. In the ER patient was found to be febrile and initially mildly hypotensive. Labs show elevated WBC count. On-call urologist Dr. Marlou Porch was consulted by the ER physician.  Dr. Marlou Porch reportedly did not feel patient had a true renal obstruction and suggested to treat for UTI. Patient's blood pressure improved with fluids.    Clinical Impression  Pt admitted with Sepsis due to Pyelonephritis . Pt currently with functional limitations due to the deficits listed below (see PT Problem List). Very limited due to overall fatigue and reports dizziness upon standing.  Pt will benefit from skilled PT to increase their independence and safety with mobility to allow discharge to the venue listed below.     PT Assessment  Patient needs continued PT services    Follow Up Recommendations  SNF    Equipment Recommendations  None recommended by PT    Frequency Min 3X/week    Precautions / Restrictions Precautions Precautions: Fall   Pertinent Vitals/Pain C/o right elbow pain and right flank pain 6/10      Mobility  Bed Mobility Bed Mobility: Supine to Sit;Sitting - Scoot to Edge of Bed Supine to Sit: 3: Mod assist;With rails Sitting - Scoot to Edge of Bed: 3: Mod assist;With  rail Details for Bed Mobility Assistance: (A) to elevate trunk OOB with cues for technique Transfers Transfers: Sit to Stand;Stand to Sit;Stand Pivot Transfers Sit to Stand: 3: Mod assist;From bed;From elevated surface Stand to Sit: 3: Mod assist;To chair/3-in-1 Stand Pivot Transfers: 3: Mod assist Details for Transfer Assistance: (A) to initiate transfer and max cues for technique Ambulation/Gait Ambulation/Gait Assistance: Not tested (comment) (due to fatigue)    Exercises     PT Diagnosis: Difficulty walking;Generalized weakness;Acute pain  PT Problem List: Decreased strength;Decreased activity tolerance;Decreased balance;Decreased mobility;Decreased cognition;Decreased knowledge of use of DME;Decreased safety awareness PT Treatment Interventions: DME instruction;Gait training;Functional mobility training;Therapeutic activities;Therapeutic exercise;Balance training;Cognitive remediation;Patient/family education     PT Goals(Current goals can be found in the care plan section) Acute Rehab PT Goals Patient Stated Goal: To get stronger PT Goal Formulation: With patient Time For Goal Achievement: 04/28/13 Potential to Achieve Goals: Good  Visit Information  Last PT Received On: 04/14/13 Assistance Needed: +2 (ambulation) History of Present Illness: 72 y.o. female w/ history of renal cell carcinoma status post left-sided nephrectomy 2013, hypertension, and depression who was referred to ER after patient had abnormal CT abdomen and pelvis. Patient had been having abdominal pain in the right flank area for 3-4 days. Patient also had been having subjective feeling of fever chills with nausea. Denied any vomiting or diarrhea. CT abdomen and pelvis revealed the possibility of obstruction in the right kidney and pt was referred to the ER. In the ER patient was found to be febrile and initially mildly hypotensive. Labs show elevated WBC count. On-call urologist Dr.  Marlou Porch was consulted by the ER  physician.  Dr. Marlou Porch reportedly did not feel patient had a true renal obstruction and suggested to treat for UTI. Patient's blood pressure improved with fluids.         Prior Functioning  Home Living Family/patient expects to be discharged to:: Assisted living Home Equipment: Dan Humphreys - 2 wheels;Wheelchair - Engineer, technical sales - power Prior Function Level of Independence: Independent with assistive device(s) Comments: Pt ambulates in room with rollater however goes to dining hall with electric w/c Communication Communication: No difficulties Dominant Hand: Right    Cognition  Cognition Arousal/Alertness: Awake/alert Behavior During Therapy: WFL for tasks assessed/performed Overall Cognitive Status: Impaired/Different from baseline Area of Impairment: Problem solving Problem Solving: Slow processing;Difficulty sequencing;Requires tactile cues;Requires verbal cues    Extremity/Trunk Assessment Lower Extremity Assessment Lower Extremity Assessment: Generalized weakness   Balance Balance Balance Assessed: Yes Static Sitting Balance Static Sitting - Balance Support: Feet supported Static Sitting - Level of Assistance: 6: Modified independent (Device/Increase time)  End of Session PT - End of Session Equipment Utilized During Treatment: Gait belt;Oxygen (2L) Activity Tolerance: Patient limited by fatigue Patient left: in chair;with call bell/phone within reach Nurse Communication: Mobility status  GP     Alichia Alridge 04/14/2013, 5:39 PM   Jake Shark, PT DPT [phone]

## 2013-04-14 NOTE — Progress Notes (Signed)
[facility] PROGRESS NOTE  Wendy Erickson BJY:[id number] DOB: 1940-08-27 DOA: 04/11/2013 PCP: Ginette Otto, MD  Assessment/Plan: 1-Sepsis/ pyelonephritis (temp 102 + WBC 21 on admission)  - Continue with Zosyn for possible complicated UTI/pyelonephritis.  urine culture no growth. Continue IV fluids.  WBC trending down, at 12. blood cultures no growth to date.   -Need at least 14 days of antibiotics. Day 4 antibiotics.  -Spike fever last night.   2- Acute on Chronic Renal Failure, solitary kidney  - Cr baseline 1.2 to 1.3. Renal function improving. Continue to hold ARB. Continue with IV fluids. Cr peak to 1.9, has decrease to 1.2.   3-Hypertension - holding  ARB due to acute renal failure. Clonidine resume.  PRN hydralazine.  4-History of depression - continue present medications.  5-History of renal cell carcinoma status post left-sided nephrectomy 6-Abdominal pain: likely related to right kidney finding.  7-DVT prophylaxis: SCD. Hold on anticoagulation due to ? Hemorrhagic lesion on right kidney.  8-? Ureteral junction obstruction, 3.2 Cm hemorraghic appearing lesion inferior aspect right kidney: Dr Wilson Singer helping with patient care. CT with contrast ordered for further  evaluation.   Code Status: Full Code.  Family Communication: none at bedside.  Disposition Plan: Remain inpatient.    Consultants:  Dr Marlou Porch, urologist.   Procedures:  none  Antibiotics:  Zosyn 9-04  HPI/Subjective: Feeling better, no complaints.   Objective: Filed Vitals:   04/14/13 1307  BP: 184/67  Pulse: 70  Temp: 98.3 F (36.8 C)  Resp: 20    Intake/Output Summary (Last 24 hours) at 04/14/13 1314 Last data filed at 04/14/13 0910  Gross per 24 hour  Intake   1100 ml  Output    350 ml  Net    750 ml   Filed Weights   04/12/13 0203 04/13/13 0500  Weight: 74.1 kg (163 lb 5.8 oz) 77.1 kg (169 lb 15.6 oz)    Exam:   General:  No distress.   Cardiovascular: S 1, S 2  RRR  Respiratory: CTA  Abdomen: Bs present, soft, mild right side tenderness.   Musculoskeletal: no edema.   Data Reviewed: Basic Metabolic Panel:  Recent Labs Lab 04/11/13 1854 04/12/13 0535 04/13/13 0400 04/14/13 0520  NA 130* 136 138 137  K 4.4 3.9 4.0 4.0  CL 91* 99 106 104  CO2 22 19 18* 19  GLUCOSE 78 57* 108* 103*  BUN 42* 37* 30* 21  CREATININE 1.96* 1.69* 1.40* 1.21*  CALCIUM 9.1 8.6 7.8* 8.6   Liver Function Tests:  Recent Labs Lab 04/11/13 1854  AST 24  ALT 28  ALKPHOS 69  BILITOT 0.6  PROT 7.6  ALBUMIN 3.4*   No results found for this basename: LIPASE, AMYLASE,  in the last 168 hours No results found for this basename: AMMONIA,  in the last 168 hours CBC:  Recent Labs Lab 04/11/13 1854 04/12/13 0535 04/13/13 0400 04/14/13 0520  WBC 21.7* 19.6* 13.9* 12.3*  HGB 11.6* 11.1* 9.0* 9.7*  HCT 32.9* 32.5* 26.8* 28.2*  MCV 87.5 88.3 89.6 90.1  PLT 286 248 266 261   Cardiac Enzymes:  Recent Labs Lab 04/11/13 1854  TROPONINI <0.30   BNP (last 3 results) No results found for this basename: PROBNP,  in the last 8760 hours CBG: No results found for this basename: GLUCAP,  in the last 168 hours  Recent Results (from the past 240 hour(s))  CULTURE, BLOOD (ROUTINE X 2)     Status: None   Collection Time  04/11/13  7:40 PM      Result Value Range Status   Specimen Description BLOOD HAND LEFT   Final   Special Requests BOTTLES DRAWN AEROBIC ONLY 1CC   Final   Culture  Setup Time     Final   Value: 04/12/2013 06:47     Performed at [facility]   Culture     Final   Value:        BLOOD CULTURE RECEIVED NO GROWTH TO DATE CULTURE WILL BE HELD FOR 5 DAYS BEFORE ISSUING A FINAL NEGATIVE REPORT     Performed at [facility]   Report Status PENDING   Incomplete  CULTURE, BLOOD (ROUTINE X 2)     Status: None   Collection Time    04/11/13  7:47 PM      Result Value Range Status   Specimen Description BLOOD ARM RIGHT   Final    Special Requests BOTTLES DRAWN AEROBIC ONLY 5CC   Final   Culture  Setup Time     Final   Value: 04/12/2013 06:47     Performed at [facility]   Culture     Final   Value:        BLOOD CULTURE RECEIVED NO GROWTH TO DATE CULTURE WILL BE HELD FOR 5 DAYS BEFORE ISSUING A FINAL NEGATIVE REPORT     Performed at [facility]   Report Status PENDING   Incomplete  URINE CULTURE     Status: None   Collection Time    04/11/13  8:36 PM      Result Value Range Status   Specimen Description URINE, RANDOM   Final   Special Requests NONE   Final   Culture  Setup Time     Final   Value: 04/11/2013 22:17     Performed at [facility] Count     Final   Value: NO GROWTH     Performed at [facility]   Culture     Final   Value: NO GROWTH     Performed at [facility]   Report Status 04/13/2013 FINAL   Final  MRSA PCR SCREENING     Status: None   Collection Time    04/12/13  3:00 AM      Result Value Range Status   MRSA by PCR NEGATIVE  NEGATIVE Final   Comment:            The GeneXpert MRSA Assay (FDA     approved for NASAL specimens     only), is one component of a     comprehensive MRSA colonization     surveillance program. It is not     intended to diagnose MRSA     infection nor to guide or     monitor treatment for     MRSA infections.     Studies: No results found.  Scheduled Meds: . antiseptic oral rinse  15 mL Mouth Rinse BID  . buPROPion  150 mg Oral Daily  . cloNIDine  0.1 mg Oral Daily  . FLUoxetine  10 mg Oral Daily  . gabapentin  300 mg Oral BID  . pantoprazole  40 mg Oral Daily  . piperacillin-tazobactam (ZOSYN)  IV  3.375 g Intravenous Q8H  . polycarbophil  625 mg Oral Q breakfast  . sodium chloride  3 mL Intravenous Q12H   Continuous Infusions: . sodium chloride 100 mL/hr at 04/13/13 1836  Principal Problem:   Pyelonephritis Active Problems:   AKI (acute kidney injury)   HTN (hypertension)    Hydronephrosis of right kidney   Absent kidney, acquired    Time spent: 35 minutes.    Jerame Hedding  [facility] Pager [phone]. If 7PM-7AM, please contact night-coverage at [email or web address], password [facility] 04/14/2013, 1:14 PM  LOS: 3 days

## 2013-04-14 NOTE — Progress Notes (Signed)
Patient ID: Wendy Erickson, female   DOB: 03-Oct-1940, 72 y.o.   MRN: [id number]    Subjective: Wendy Erickson continues to improve clinically with a further decline in her Cr and WBC count.   She continues to have some right flank pain, but no fever.  ROS: Negative except as above.   No nausea or voiding complaints.   Objective: Vital signs in last 24 hours: Temp:  [98.8 F (37.1 C)-100.1 F (37.8 C)] 98.8 F (37.1 C) (09/07 0609) Pulse Rate:  [73-88] 73 (09/07 0609) Resp:  [16-24] 20 (09/07 0609) BP: (146-179)/(52-68) 179/60 mmHg (09/07 0609) SpO2:  [93 %-98 %] 95 % (09/07 0609)  Intake/Output from previous day: 09/06 0701 - 09/07 0700 In: 1785 [P.O.:360; I.V.:1375; IV Piggyback:50] Out: 700 [Urine:700] Intake/Output this shift:    General appearance: alert and no distress GI: Soft but with RUQ tenderness and guarding.   Lab Results:   Recent Labs  04/13/13 0400 04/14/13 0520  WBC 13.9* 12.3*  HGB 9.0* 9.7*  HCT 26.8* 28.2*  PLT 266 261   BMET  Recent Labs  04/13/13 0400 04/14/13 0520  NA 138 137  K 4.0 4.0  CL 106 104  CO2 18* 19  GLUCOSE 108* 103*  BUN 30* 21  CREATININE 1.40* 1.21*  CALCIUM 7.8* 8.6   PT/INR  Recent Labs  04/11/13 1854  LABPROT 15.2  INR 1.23   ABG No results found for this basename: PHART, PCO2, PO2, HCO3,  in the last 72 hours  Studies/Results: No results found.  Anti-infectives: Anti-infectives   Start     Dose/Rate Route Frequency Ordered Stop   04/12/13 1000  piperacillin-tazobactam (ZOSYN) IVPB 3.375 g     3.375 g 12.5 mL/hr over 240 Minutes Intravenous Every 8 hours 04/12/13 0204     04/12/13 0130  piperacillin-tazobactam (ZOSYN) IVPB 3.375 g     3.375 g 100 mL/hr over 30 Minutes Intravenous NOW 04/12/13 0119 04/12/13 0323   04/11/13 1900  cefTRIAXone (ROCEPHIN) 2 g in dextrose 5 % 50 mL IVPB     2 g 100 mL/hr over 30 Minutes Intravenous  Once 04/11/13 1855 04/11/13 2211      Current Facility-Administered Medications   Medication Dose Route Frequency Provider Last Rate Last Dose  . 0.9 %  sodium chloride infusion   Intravenous Continuous Lonia Blood, MD 100 mL/hr at 04/13/13 1836    . acetaminophen (TYLENOL) tablet 650 mg  650 mg Oral Q6H PRN Eduard Clos, MD       Or  . acetaminophen (TYLENOL) suppository 650 mg  650 mg Rectal Q6H PRN Eduard Clos, MD      . antiseptic oral rinse (BIOTENE) solution 15 mL  15 mL Mouth Rinse BID Lonia Blood, MD   15 mL at 04/13/13 2035  . buPROPion (WELLBUTRIN XL) 24 hr tablet 150 mg  150 mg Oral Daily Belkys A Regalado, MD   150 mg at 04/13/13 0914  . cloNIDine (CATAPRES) tablet 0.1 mg  0.1 mg Oral Daily Lonia Blood, MD   0.1 mg at 04/13/13 1117  . FLUoxetine (PROZAC) capsule 10 mg  10 mg Oral Daily Eduard Clos, MD   10 mg at 04/13/13 0914  . gabapentin (NEURONTIN) capsule 300 mg  300 mg Oral BID Eduard Clos, MD   300 mg at 04/13/13 2328  . hydrALAZINE (APRESOLINE) injection 10 mg  10 mg Intravenous Q4H PRN Eduard Clos, MD      . morphine 2  MG/ML injection 1-2 mg  1-2 mg Intravenous Q4H PRN Lonia Blood, MD   2 mg at 04/13/13 2048  . ondansetron (ZOFRAN) tablet 4 mg  4 mg Oral Q6H PRN Eduard Clos, MD   4 mg at 04/12/13 0911   Or  . ondansetron (ZOFRAN) injection 4 mg  4 mg Intravenous Q6H PRN Eduard Clos, MD      . oxyCODONE (Oxy IR/ROXICODONE) immediate release tablet 5-10 mg  5-10 mg Oral Q4H PRN Lonia Blood, MD      . pantoprazole (PROTONIX) EC tablet 40 mg  40 mg Oral Daily Eduard Clos, MD   40 mg at 04/13/13 0914  . piperacillin-tazobactam (ZOSYN) IVPB 3.375 g  3.375 g Intravenous Q8H Hilario Quarry [place], RPH   3.375 g at 04/14/13 1610  . polycarbophil (FIBERCON) tablet 625 mg  625 mg Oral Q breakfast Eduard Clos, MD   625 mg at 04/13/13 0710  . sodium chloride 0.9 % injection 3 mL  3 mL Intravenous Q12H Eduard Clos, MD   3 mL at 04/13/13 0915     Assessment: Clinically she continues to improve but I would have expected her pain to have declined more than it has.  Plan: Continue current care, but I think a contrast enhanced abdominal CT would be worthwhile to assess the hemorrhagic lesion.   With a solitary kidney and normalizing renal function, significant obstruction is less of a concern.      LOS: 3 days    Dessa Ledee J 04/14/2013

## 2013-04-15 LAB — BASIC METABOLIC PANEL
Chloride: 99 mEq/L (ref 96–112)
Creatinine, Ser: 1.13 mg/dL — ABNORMAL HIGH (ref 0.50–1.10)
GFR calc Af Amer: 56 mL/min — ABNORMAL LOW (ref >90)
GFR calc non Af Amer: 48 mL/min — ABNORMAL LOW (ref >90)
Potassium: 3.6 mEq/L (ref 3.5–5.1)

## 2013-04-15 LAB — CBC
MCHC: 34.7 g/dL (ref 30.0–36.0)
Platelets: 312 10*3/uL (ref 150–400)
RDW: 13.3 % (ref 11.5–15.5)
WBC: 12.5 10*3/uL — ABNORMAL HIGH (ref 4.0–10.5)

## 2013-04-15 MED ORDER — CIPROFLOXACIN HCL 250 MG PO TABS: 250.0000 mg | ORAL_TABLET | Freq: Two times a day (BID) | ORAL | Status: DC

## 2013-04-15 MED ORDER — HYDROCODONE-ACETAMINOPHEN 5-325 MG PO TABS: 1.0000 | ORAL_TABLET | ORAL | Status: DC | PRN

## 2013-04-15 MED ORDER — CLONIDINE HCL 0.2 MG PO TABS: 0.2000 mg | ORAL_TABLET | Freq: Every day | ORAL | Status: DC

## 2013-04-15 MED ORDER — CLONIDINE HCL 0.1 MG PO TABS: 0.2000 mg | ORAL_TABLET | Freq: Every day | ORAL | Status: DC

## 2013-04-15 NOTE — Clinical Social Work Placement (Addendum)
  Clinical Child psychotherapist received authorization from [employer].  Clinical Social Work Department CLINICAL SOCIAL WORK PLACEMENT NOTE 04/15/2013  Patient:  MEGIN, CONSALVO  Account Number:  [id number] Admit date:  04/11/2013  Clinical Social Worker:  Hulan Fray  Date/time:  04/15/2013 01:22 PM  Clinical Social Work is seeking post-discharge placement for this patient at the following level of care:   SKILLED NURSING   (*CSW will update this form in Epic as items are completed)   04/15/2013  Patient/family provided with [facility] Department of Clinical Social Work's list of facilities offering this level of care within the geographic area requested by the patient (or if unable, by the patient's family).  04/15/2013  Patient/family informed of their freedom to choose among providers that offer the needed level of care, that participate in Medicare, Medicaid or managed care program needed by the patient, have an available bed and are willing to accept the patient.  04/15/2013  Patient/family informed of [facility]' ownership interest in [facility], as well as of the fact that they are under no obligation to receive care at this facility.  PASARR submitted to EDS on 04/15/2013 PASARR number received from EDS on 04/15/2013  FL2 transmitted to all facilities in geographic area requested by pt/family on  04/15/2013 FL2 transmitted to all facilities within larger geographic area on   Patient informed that his/her managed care company has contracts with or will negotiate with  certain facilities, including the following:     Patient/family informed of bed offers received:  04/15/2013 Patient chooses bed at [facility] Physician recommends and patient chooses bed at    Patient to be transferred to [facility] on  04/15/2013 Patient to be transferred to facility by [facility]  The following physician request were  entered in Epic:   Additional Comments:

## 2013-04-15 NOTE — Progress Notes (Signed)
Report called to Sam,RN at [facility]. Await transfer by [facility].Irena Reichmann 04/15/2013

## 2013-04-15 NOTE — Progress Notes (Signed)
ANTIBIOTIC CONSULT NOTE - FOLLOW UP  Pharmacy Consult:  Zosyn Indication:  Pyelonephritis  No Known Allergies  Patient Measurements: Height: 5\' 3"  (160 cm) Weight: 166 lb 14.2 oz (75.7 kg) IBW/kg (Calculated) : 52.4  Vital Signs: Temp: 97.9 F (36.6 C) (09/08 0559) Temp src: Oral (09/08 0559) BP: 188/69 mmHg (09/08 0559) Pulse Rate: 76 (09/08 0559) Intake/Output from previous day: 09/07 0701 - 09/08 0700 In: 2730 [P.O.:780; I.V.:1800; IV Piggyback:150] Out: 350 [Urine:350]  Labs:  Recent Labs  04/13/13 0400 04/14/13 0520 04/15/13 0540  WBC 13.9* 12.3* 12.5*  HGB 9.0* 9.7* 10.3*  PLT 266 261 312  CREATININE 1.40* 1.21* 1.13*   Estimated Creatinine Clearance: 45.8 ml/min (by C-G formula based on Cr of 1.13). No results found for this basename: VANCOTROUGH, VANCOPEAK, VANCORANDOM, GENTTROUGH, GENTPEAK, GENTRANDOM, TOBRATROUGH, TOBRAPEAK, TOBRARND, AMIKACINPEAK, AMIKACINTROU, AMIKACIN,  in the last 72 hours   Microbiology: Recent Results (from the past 720 hour(s))  CULTURE, BLOOD (ROUTINE X 2)     Status: None   Collection Time    04/11/13  7:40 PM      Result Value Range Status   Specimen Description BLOOD HAND LEFT   Final   Special Requests BOTTLES DRAWN AEROBIC ONLY 1CC   Final   Culture  Setup Time     Final   Value: 04/12/2013 06:47     Performed at [facility]   Culture     Final   Value:        BLOOD CULTURE RECEIVED NO GROWTH TO DATE CULTURE WILL BE HELD FOR 5 DAYS BEFORE ISSUING A FINAL NEGATIVE REPORT     Performed at [facility]   Report Status PENDING   Incomplete  CULTURE, BLOOD (ROUTINE X 2)     Status: None   Collection Time    04/11/13  7:47 PM      Result Value Range Status   Specimen Description BLOOD ARM RIGHT   Final   Special Requests BOTTLES DRAWN AEROBIC ONLY 5CC   Final   Culture  Setup Time     Final   Value: 04/12/2013 06:47     Performed at [facility]   Culture     Final   Value:        BLOOD CULTURE  RECEIVED NO GROWTH TO DATE CULTURE WILL BE HELD FOR 5 DAYS BEFORE ISSUING A FINAL NEGATIVE REPORT     Performed at [facility]   Report Status PENDING   Incomplete  URINE CULTURE     Status: None   Collection Time    04/11/13  8:36 PM      Result Value Range Status   Specimen Description URINE, RANDOM   Final   Special Requests NONE   Final   Culture  Setup Time     Final   Value: 04/11/2013 22:17     Performed at [facility] Count     Final   Value: NO GROWTH     Performed at [facility]   Culture     Final   Value: NO GROWTH     Performed at [facility]   Report Status 04/13/2013 FINAL   Final  MRSA PCR SCREENING     Status: None   Collection Time    04/12/13  3:00 AM      Result Value Range Status   MRSA by PCR NEGATIVE  NEGATIVE Final   Comment:  The GeneXpert MRSA Assay (FDA     approved for NASAL specimens     only), is one component of a     comprehensive MRSA colonization     surveillance program. It is not     intended to diagnose MRSA     infection nor to guide or     monitor treatment for     MRSA infections.       Assessment: 27 YOF with pyelonephritis to continue on Zosyn, day #4.  Patient's renal function is improving.  9/4 Urine Cx - negative 9/4 blood cx - NGTD   Goal of Therapy:  Clearance of infection   Plan:  - Continue Zosyn 3.375gm IV Q8H, 4 hr infusion - F/U renal function, clinical progress - Consider increasing clonidine or adding another agent for better BP control (Mavik originally held d/t renal dysfunction)    Melaney Tellefsen D. Laney Potash, PharmD, BCPS Pager:  [phone] 04/15/2013, 8:45 AM

## 2013-04-15 NOTE — Progress Notes (Signed)
Patient ID: Marlon Pel, female   DOB: March 05, 1941, 72 y.o.   MRN: [id number]    Subjective: I have reviewed notes from patient's hospital admission last Friday up until today.  She is known to me having had a left nephrectomy for renal cell carcinoma.  She developed acute onset of severe right sided flank and abdominal pain last Thursday and also was noted to have a fever to 102 and an elevated WBC.  Her UA appeared contaminated but was not particularly concerning for infection and urine culture was negative.  She has been treated empirically with Zosyn and fever has improved and WBC is declining.  In addition, she has a known partial right UPJ obstruction which has not caused any problems for her over her lifetime. 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 A CT on admission demonstrated findings consistent with hemorrhage into her right renal cyst.  Prior imaging studies had characterized this cyst as a simple cyst. Her Cr was somewhat elevated from her baseline upon admission but has improved back to her baseline with IVF hydration.  Overall, she feels better.  Her pain is improved but still present.  Interestingly, her pain is exacerbated by movement.  Objective: Vital signs in last 24 hours: Temp:  [97.9 F (36.6 C)-98.3 F (36.8 C)] 97.9 F (36.6 C) (09/08 0559) Pulse Rate:  [70-80] 76 (09/08 0559) Resp:  [18-20] 19 (09/08 0559) BP: (172-188)/(62-77) 188/69 mmHg (09/08 0559) SpO2:  [94 %-97 %] 97 % (09/08 0559) Weight:  [75.7 kg (166 lb 14.2 oz)] 75.7 kg (166 lb 14.2 oz) (09/08 0559)  Intake/Output from previous day: 09/07 0701 - 09/08 0700 In: 2730 [P.O.:780; I.V.:1800; IV Piggyback:150] Out: 350 [Urine:350] Intake/Output this shift: Total I/O In: 1050 [I.V.:1000; IV Piggyback:50] Out: -   Physical Exam:  General: Alert and oriented Abdomen: Soft, ND, minimal tenderness over right CVA  Lab Results:  Recent Labs  04/13/13 0400 04/14/13 0520  HGB 9.0*  9.7*  HCT 26.8* 28.2*   BMET  Recent Labs  04/13/13 0400 04/14/13 0520  NA 138 137  K 4.0 4.0  CL 106 104  CO2 18* 19  GLUCOSE 108* 103*  BUN 30* 21  CREATININE 1.40* 1.21*  CALCIUM 7.8* 8.6     Studies/Results: Ct Abdomen W Contrast  04/14/2013   *RADIOLOGY REPORT*  Clinical Data: Hemorrhagic right renal lesion with persistent pain. Prior left nephrectomy.  CT ABDOMEN WITH CONTRAST  Technique:  Multidetector CT imaging of the abdomen was performed following the standard protocol during bolus administration of intravenous contrast.  Contrast: 75mL OMNIPAQUE IOHEXOL 300 MG/ML  SOLN  Comparison: 04/11/2013 and 10/30/2012  Findings: Lung bases demonstrate development of small bilateral pleural effusions right worse than left with associated atelectatic change.  Abdominal images demonstrate several small liver hypodensities unchanged likely cysts. There is subtle periportal edema.  There are several splenic granulomas.  There is evidence of patient's prior left nephrectomy.  Pancreas is within normal.  Gallbladder is unremarkable.  Left adrenal gland is within normal.  There is no change in a 2 cm right adrenal nodule likely an adenoma.  The right kidney is normal in size and demonstrates chronic dilatation of the right collecting system and renal pelvis.  Again noted is in as of the neck rounded mass over the posterior lateral lower pole of the right kidney measuring approximately 3.2 x 3.6 cm in its AP and transverse dimensions.  This is somewhat heterogeneous appearance without significant enhancement compared to the prior  noncontrast exam.  There is moderate adjacent increased density perinephric fluid compatible with hemorrhage which is slightly worse. Remainder of the exam is unchanged.  IMPRESSION: No change in patient's exophytic hemorrhagic mass over the posterior lateral lower pole cortex of the right kidney measuring 3.2 x 3.6 cm. There is slightly more adjacent perinephric hemorrhage  compared to the recent prior exam.  This may represent a hemorrhagic cyst, although cannot exclude a hemorrhagic cystic neoplasm.  Several small liver hypodensities unchanged likely cysts.  Subtle periportal edema.  Development of small bilateral pleural effusions right worse than left with adjacent compressive atelectasis.  Left nephrectomy.  Stable 2 cm right adrenal mass likely an adenoma.   Original Report Authenticated By: Elberta Fortis, M.D.   I have reviewed her recent CT scans and prior CT and MRI scans.  Assessment/Plan: 1) Right renal cyst with acute hemorrhage into cyst and perinephric area: This certainly may be the etiology for her pain symptoms although it is somewhat unusual that her pain is only exacerbated by movement.  Based on her prior CT scan in the spring and her MRI last July, this cyst did appear to be a simple cyst and therefore my suspicion of malignancy would be low.  However, I do not have a very good explanation for why she would have developed an acute bleed from the kidney or into this cyst. I would recommend pain control and she is scheduled to see me in October at which time I will repeat imaging studies for further evaluation.  Her Hgb is stable and she does not have evidence of ongoing bleeding.  2) Elevated Cr:  This has returned to her baseline.  She has chronic dilation of her right kidney without evidence to suggest obstruction.  3) Infection: It is unclear what the source for her infection would be.  Considering her UA and urine culture results, I cannot see any objective evidence of a UTI or renal infection.  Empiric antibiotic therapy would seem to be warranted for 10-14 days although it is difficult to know what medication to use considering the source of infection is unclear.  From a urologic standpoint, there is no for intervention.  Considering that her Hgb is stable, if her pain is controlled, she can be discharged from a urologic perspective with plans for  urologic follow up in October as scheduled.   LOS: 4 days   Akin Yi,LES 04/15/2013, 6:52 AM

## 2013-04-15 NOTE — Clinical Social Work Note (Signed)
Clinical Social Work Department BRIEF PSYCHOSOCIAL ASSESSMENT 04/15/2013  Patient:  Wendy Erickson, Wendy Erickson     Account Number:  [id number]     Admit date:  04/11/2013  Clinical Social Worker:  Hulan Fray  Date/Time:  04/15/2013 12:01 PM  Referred by:  Physician  Date Referred:  04/15/2013 Referred for  SNF Placement   Other Referral:   Interview type:  Patient Other interview type:    PSYCHOSOCIAL DATA Living Status:  FACILITY Admitted from facility:  [facility] Level of care:  Independent Living Primary support name:  Katryna Tschirhart Primary support relationship to patient:  CHILD, ADULT Degree of support available:   supportive    CURRENT CONCERNS Current Concerns  Post-Acute Placement   Other Concerns:    SOCIAL WORK ASSESSMENT / PLAN Clinical Social Worker received referral for patient being admitted from Independent Living Facility, [facility]. CSW introduced self and explained reason for visit. Patient has a bed at [facility] and they will be able to receive patient today.    CSW is awaiting [facility] Medicare Prior Approval. Patient will be transported via ambulance.   Assessment/plan status:  Psychosocial Support/Ongoing Assessment of Needs Other assessment/ plan:   Information/referral to community resources:   Patient is from facility    PATIENT'S/FAMILY'S RESPONSE TO PLAN OF CARE: Patient reported that she plans to return back to [facility] at discharge. Patient was appreciative of CSW's assistance with discharge plans.

## 2013-04-15 NOTE — Evaluation (Signed)
Occupational Therapy Evaluation Patient Details Name: Wendy Erickson MRN: [id number] DOB: [birth year] Today's Date: 04/15/2013 Time: 8295-6213 OT Time Calculation (min): 24 min  OT Assessment / Plan / Recommendation History of present illness 72 y.o. female w/ history of renal cell carcinoma status post left-sided nephrectomy 2013, hypertension, and depression who was referred to ER after patient had abnormal CT abdomen and pelvis. Patient had been having abdominal pain in the right flank area for 3-4 days. Patient also had been having subjective feeling of fever chills with nausea. Denied any vomiting or diarrhea. CT abdomen and pelvis revealed the possibility of obstruction in the right kidney and pt was referred to the ER. In the ER patient was found to be febrile and initially mildly hypotensive. Labs show elevated WBC count. On-call urologist Dr. Marlou Porch was consulted by the ER physician.  Dr. Marlou Porch reportedly did not feel patient had a true renal obstruction and suggested to treat for UTI. Patient's blood pressure improved with fluids.     Clinical Impression   Pt demos decline in function with ADLs and ADL safety and would benefit from acute OT services to address impairments to increase level of function and safety. Pt i a resident of an ALF and would benefit from SNF for further therapies after acute care d/c    OT Assessment  Patient needs continued OT Services    Follow Up Recommendations  SNF;Supervision/Assistance - 24 hour    Barriers to Discharge   none  Equipment Recommendations  None recommended by OT    Recommendations for Other Services    Frequency  Min 2X/week    Precautions / Restrictions Precautions Precautions: Fall Restrictions Weight Bearing Restrictions: No   Pertinent Vitals/Pain 6/10    ADL  Grooming: Performed;Wash/dry hands;Wash/dry face;Moderate assistance Where Assessed - Grooming: Supported standing Upper Body Bathing:  Simulated;Supervision/safety;Set up Lower Body Bathing: Simulated;Maximal assistance Upper Body Dressing: Performed;Supervision/safety;Set up Lower Body Dressing: Performed;Maximal assistance Toilet Transfer: Performed;Moderate assistance Toilet Transfer Method: Sit to stand Toilet Transfer Equipment: Bedside commode Toileting - Clothing Manipulation and Hygiene: Performed;Maximal assistance Where Assessed - Engineer, mining and Hygiene: Standing Tub/Shower Transfer Method: Not assessed Equipment Used: Gait belt;Rolling walker;Other (comment) (BSC) Transfers/Ambulation Related to ADLs: verbal cues for correct hand placement, safe technique    OT Diagnosis: Generalized weakness;Acute pain  OT Problem List: Decreased strength;Decreased activity tolerance;Impaired balance (sitting and/or standing);Decreased safety awareness;Pain OT Treatment Interventions: Self-care/ADL training;Therapeutic exercise;Patient/family education;Therapeutic activities;Neuromuscular education;Balance training   OT Goals(Current goals can be found in the care plan section) Acute Rehab OT Goals Patient Stated Goal: To get better and go back to [facility] OT Goal Formulation: With patient Time For Goal Achievement: 04/22/13 Potential to Achieve Goals: Good ADL Goals Pt Will Perform Grooming: with min assist;standing Pt Will Perform Lower Body Bathing: with mod assist Pt Will Perform Lower Body Dressing: with mod assist Pt Will Transfer to Toilet: with min assist;bedside commode;regular height toilet;grab bars Pt Will Perform Toileting - Clothing Manipulation and hygiene: with min assist;with mod assist;sit to/from stand  Visit Information  Last OT Received On: 04/15/13 Assistance Needed: +1 History of Present Illness: 72 y.o. female w/ history of renal cell carcinoma status post left-sided nephrectomy 2013, hypertension, and depression who was referred to ER after patient had abnormal CT abdomen  and pelvis. Patient had been having abdominal pain in the right flank area for 3-4 days. Patient also had been having subjective feeling of fever chills with nausea. Denied any vomiting or diarrhea. CT abdomen and pelvis revealed  the possibility of obstruction in the right kidney and pt was referred to the ER. In the ER patient was found to be febrile and initially mildly hypotensive. Labs show elevated WBC count. On-call urologist Dr. Marlou Porch was consulted by the ER physician.  Dr. Marlou Porch reportedly did not feel patient had a true renal obstruction and suggested to treat for UTI. Patient's blood pressure improved with fluids.         Prior Functioning     Home Living Family/patient expects to be discharged to:: Assisted living Home Equipment: Dan Humphreys - 2 wheels;Wheelchair - Engineer, technical sales - power Prior Function Level of Independence: Independent with assistive device(s) Comments: Pt ambulates in room with rollater however goes to dining hall with electric w/c Communication Communication: No difficulties Dominant Hand: Left         Vision/Perception Vision - History Baseline Vision: Wears glasses all the time Patient Visual Report: No change from baseline Perception Perception: Within Functional Limits   Cognition  Cognition Arousal/Alertness: Awake/alert Behavior During Therapy: WFL for tasks assessed/performed Area of Impairment: Problem solving Problem Solving: Slow processing;Difficulty sequencing;Requires tactile cues;Requires verbal cues    Extremity/Trunk Assessment Upper Extremity Assessment Upper Extremity Assessment: Overall WFL for tasks assessed;Generalized weakness Cervical / Trunk Assessment Cervical / Trunk Assessment: Normal     Mobility Bed Mobility Bed Mobility: Sit to Supine;Scooting to HOB Sit to Supine: 4: Min assist Scooting to [facility]: 4: Min assist Details for Bed Mobility Assistance: min A with LEs onto bed Transfers Transfers: Sit to Stand;Stand  to Sit Sit to Stand: 3: Mod assist;From chair/3-in-1 Stand to Sit: 4: Min assist;To chair/3-in-1;To bed Details for Transfer Assistance: (A) to initiate transfer and max cues for technique     Exercise     Balance Balance Balance Assessed: Yes Dynamic Standing Balance Dynamic Standing - Balance Support: Right upper extremity supported;Left upper extremity supported;During functional activity Dynamic Standing - Level of Assistance: 3: Mod assist   End of Session OT - End of Session Equipment Utilized During Treatment: Gait belt;Rolling walker;Other (comment) (BSC) Activity Tolerance: Patient limited by fatigue Patient left: in bed;with call bell/phone within reach  GO     Galen Manila 04/15/2013, 12:56 PM

## 2013-04-15 NOTE — Discharge Summary (Signed)
Physician Discharge Summary  Wendy Erickson ZOX:[id number] DOB: [birth year] DOA: 04/11/2013  PCP: Ginette Otto, MD  Admit date: 04/11/2013 Discharge date: 04/15/2013  Time spent: 35 minutes  Recommendations for Outpatient Follow-up:  1. Needs B-met to follow renal function. 2. Adjust BP medication.  3. Follow renal function and consider resume ACE.  4. Follow up with DR Laverle Patter for further evaluation of renal cyst.   Discharge Diagnoses:    Pyelonephritis   Acute on Chronic Renal Failure, solitary kidney    ?  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   Diet recommendation: Heart Healthy  Filed Weights   04/12/13 0203 04/13/13 0500 04/15/13 0559  Weight: 74.1 kg (163 lb 5.8 oz) 77.1 kg (169 lb 15.6 oz) 75.7 kg (166 lb 14.2 oz)    History of present illness:  Wendy Erickson is a 72 y.o. female history of renal cell carcinoma status post left-sided nephrectomy last year, hypertension, depression was referred to ER after patient had abnormal CT abdomen and pelvis. Patient has been having abdominal pain in the right flank area for last 3-4 days. Patient also had been having subjective feeling of fever chills with nausea. Denies any vomiting or diarrhea. CT abdomen and pelvis was done which was showing possibility of obstruction in the right kidney and was referred to the ER. In the ER patient was found to be febrile and initially mildly hypotensive. Labs show elevated WBC count. On-call urologist Dr. Marlou Porch was consulted by the ER physician and at this time Dr. Marlou Porch does not feel patient had any renal obstruction but to treat for UTI and they will be following as consult. Patient's blood pressure improved with fluids. Patient denies any chest pain or shortness of breath.   Hospital Course:  1-Sepsis/ pyelonephritis (temp 102 + WBC 21 on admission)  - Continue with Zosyn for possible  complicated UTI/pyelonephritis. urine culture no growth. WBC trending down, at 12. blood cultures no growth to date.  -Need at least 14 days of antibiotics. Day 5 antibiotics.  -Afebrile, will transition to ciprofloxacin.  -Patient was treated with IV fluids, support care.   2- Acute on Chronic Renal Failure, solitary kidney  - Cr baseline 1.2 to 1.3. Renal function improving. Continue to hold ARB. Patient was treated with  IV fluids. Cr peak to 1.9, has decrease to 1.1.  3-Hypertension - holding ARB due to acute renal failure. Increase clonidine to 0.2 mg.  PRN hydralazine.  4-History of depression - continue present medications.  5-History of renal cell carcinoma status post left-sided nephrectomy  6-Abdominal pain: likely related to right kidney finding.  7-DVT prophylaxis: SCD. Hold on anticoagulation due to ? Hemorrhagic lesion on right kidney.  8-? Ureteral junction obstruction, 3.2 Cm hemorraghic appearing lesion inferior aspect right kidney: Dr Wilson Singer helping with patient care. CT with contrast ordered for further evaluation. CT No change in patient's exophytic hemorrhagic mass over the posterior lateral lower pole cortex of the right kidney measuring  3.2 x 3.6 cm. There is slightly more adjacent perinephric hemorrhage compared to the recent prior exam. This may represent a hemorrhagic cyst, although cannot exclude a hemorrhagic cystic neoplasm. Hb stable. Dr Laverle Patter planing to repeat CT scan outpatient.     Procedures:  none  Consultations:  Dr Borden// Dr Wilson Singer   Discharge Exam: Filed Vitals:   04/15/13 0559  BP: 188/69  Pulse: 76  Temp: 97.9 F (36.6 C)  Resp: 19    General: no distress.  Cardiovascular: S 1, S 2 RRR Respiratory: CTA  Discharge Instructions  Discharge Orders   Future Appointments Provider Department Dept Phone   05/13/2013 11:00 AM York Spaniel, MD [facility] [phone]   Future Orders Complete By Expires   Diet - low  sodium heart healthy  As directed    Increase activity slowly  As directed        Medication List    STOP taking these medications       trandolapril 1 MG tablet  Commonly known as:  MAVIK      TAKE these medications       buPROPion 150 MG 24 hr tablet  Commonly known as:  WELLBUTRIN XL  Take 150 mg by mouth every morning.     cholecalciferol 1000 UNITS tablet  Commonly known as:  VITAMIN D  Take 2,000 Units by mouth every morning.     ciprofloxacin 250 MG tablet  Commonly known as:  CIPRO  Take 1 tablet (250 mg total) by mouth 2 (two) times daily.     cloNIDine 0.1 MG tablet  Commonly known as:  CATAPRES  Take 2 tablets (0.2 mg total) by mouth daily.     gabapentin 300 MG capsule  Commonly known as:  NEURONTIN  Take 300 mg by mouth 2 (two) times daily.     HYDROcodone-acetaminophen 5-325 MG per tablet  Commonly known as:  NORCO/VICODIN  Take 1 tablet by mouth every 4 (four) hours as needed for pain.     omeprazole 20 MG capsule  Commonly known as:  PRILOSEC  Take 20 mg by mouth 2 (two) times daily.     polycarbophil 625 MG tablet  Commonly known as:  FIBERCON  Take 625 mg by mouth daily.     PROZAC PO  Take 20 mg by mouth daily.       No Known Allergies     Follow-up Information   Follow up with Ginette Otto, MD In 1 week.   Specialty:  Internal Medicine   Contact information:   [address] [place] [address] [phone]       Follow up with Crecencio Mc, MD Today.   Specialty:  Urology   Contact information:   [address] [facility] [place] [address] [phone]        The results of significant diagnostics from this hospitalization (including imaging, microbiology, ancillary and laboratory) are listed below for reference.    Significant Diagnostic Studies: Ct Abdomen Pelvis Wo Contrast  04/11/2013   *RADIOLOGY REPORT*  Clinical Data: History of left-sided renal  cell carcinoma.  Right lower quadrant pain.  Elevated white count.  Question appendicitis versus diverticulitis.  Prior hysterectomy and both rectum. Significant change in renal function in the past 3 months.  GFR 20.  CT ABDOMEN AND PELVIS WITHOUT CONTRAST  Technique:  Multidetector CT imaging of the abdomen and pelvis was performed following the standard protocol without intravenous contrast.  Comparison: 10/30/2012 CT.  08/24/2011 MR.  Findings: Abnormal appearance of the right kidney.  This includes dilated right renal collecting system with an appearance of a right ureteral pelvic junction obstruction.  On the prior examination, vessel is seen crossing along the inferior aspect of the dilated right renal pelvis.  Additionally, interval development of 3.2 cm hemorrhagic appearing lesion inferior lateral aspect of the right kidney.  This may represent hemorrhage into the previously noted  cystic lesion. Infiltration of surrounding fat planes.  With a history of fever, superimposed infection is not excluded.  Urologic consultation is recommended.  No definitive bowel inflammatory process noted.  Appendix not clearly visualized and may been removed at the time of hysterectomy.  Liver lesions better delineated on the prior CT without gross change.  Splenic granulomas.  Trace pericardial fluid.  Right adrenal 2 cm lesion possibly an adenoma unchanged.  No left adrenal lesion.  No obvious pancreatic mass.  Prominent sized gallbladder without calcified gallstones.  Basilar subsegmental atelectasis versus scarring.  Trace pericardial effusion.  Coronary artery calcifications.  Atherosclerotic type changes of the aorta with mild ectasia. Atherosclerotic type changes iliac arteries with ectasia.  Distal left common iliac artery measures up to 1.6 cm.  Degenerative changes lumbar spine and lower thoracic spine without bony destructive lesion noted.  IMPRESSION: Abnormal appearance of the right kidney.  This includes dilated  right renal collecting system with an appearance of a right ureteral pelvic junction obstruction.  On the prior examination, vessel is seen crossing along the inferior aspect of the dilated right renal pelvis.  Additionally, interval development of 3.2 cm hemorrhagic appearing lesion inferior lateral aspect of the right kidney.  This may represent hemorrhage into the previously noted cystic lesion. Infiltration of surrounding fat planes.  With a history of fever, superimposed infection is not excluded.  Urologic consultation is recommended.  No definitive bowel inflammatory process noted.  Appendix not clearly visualized and may been removed at the time of hysterectomy.  Critical Value/emergent results were called by telephone at the time of interpretation on 04/11/2013 at 2:40 p.m. to Levonne Lapping nurse practitioner, who verbally acknowledged these results.   Original Report Authenticated By: Lacy Duverney, M.D.   Ct Abdomen W Contrast  04/14/2013   *RADIOLOGY REPORT*  Clinical Data: Hemorrhagic right renal lesion with persistent pain. Prior left nephrectomy.  CT ABDOMEN WITH CONTRAST  Technique:  Multidetector CT imaging of the abdomen was performed following the standard protocol during bolus administration of intravenous contrast.  Contrast: 75mL OMNIPAQUE IOHEXOL 300 MG/ML  SOLN  Comparison: 04/11/2013 and 10/30/2012  Findings: Lung bases demonstrate development of small bilateral pleural effusions right worse than left with associated atelectatic change.  Abdominal images demonstrate several small liver hypodensities unchanged likely cysts. There is subtle periportal edema.  There are several splenic granulomas.  There is evidence of patient's prior left nephrectomy.  Pancreas is within normal.  Gallbladder is unremarkable.  Left adrenal gland is within normal.  There is no change in a 2 cm right adrenal nodule likely an adenoma.  The right kidney is normal in size and demonstrates chronic dilatation of the  right collecting system and renal pelvis.  Again noted is in as of the neck rounded mass over the posterior lateral lower pole of the right kidney measuring approximately 3.2 x 3.6 cm in its AP and transverse dimensions.  This is somewhat heterogeneous appearance without significant enhancement compared to the prior noncontrast exam.  There is moderate adjacent increased density perinephric fluid compatible with hemorrhage which is slightly worse. Remainder of the exam is unchanged.  IMPRESSION: No change in patient's exophytic hemorrhagic mass over the posterior lateral lower pole cortex of the right kidney measuring 3.2 x 3.6 cm. There is slightly more adjacent perinephric hemorrhage compared to the recent prior exam.  This may represent a hemorrhagic cyst, although cannot exclude a hemorrhagic cystic neoplasm.  Several small liver hypodensities unchanged likely cysts.  Subtle periportal edema.  Development of small bilateral pleural effusions right worse than left with adjacent compressive atelectasis.  Left nephrectomy.  Stable 2 cm right adrenal mass likely an adenoma.   Original Report Authenticated By: Elberta Fortis, M.D.   Dg Chest Portable 1 View  04/11/2013   *RADIOLOGY REPORT*  Clinical Data: Shortness of breath, cough, fever  PORTABLE CHEST - 1 VIEW  Comparison: October 30, 2012  Findings: There is no focal infiltrate, pulmonary edema, or pleural effusion.  The previously noted 4 mm nodule in the right mid to lower lung is unchanged.  The mediastinal contour and cardiac silhouette are normal.  The soft tissues and osseous structures are stable.  IMPRESSION: No acute cardiopulmonary disease identified.   Original Report Authenticated By: Sherian Rein, M.D.    Microbiology: Recent Results (from the past 240 hour(s))  CULTURE, BLOOD (ROUTINE X 2)     Status: None   Collection Time    04/11/13  7:40 PM      Result Value Range Status   Specimen Description BLOOD HAND LEFT   Final   Special Requests  BOTTLES DRAWN AEROBIC ONLY 1CC   Final   Culture  Setup Time     Final   Value: 04/12/2013 06:47     Performed at [facility]   Culture     Final   Value:        BLOOD CULTURE RECEIVED NO GROWTH TO DATE CULTURE WILL BE HELD FOR 5 DAYS BEFORE ISSUING A FINAL NEGATIVE REPORT     Performed at [facility]   Report Status PENDING   Incomplete  CULTURE, BLOOD (ROUTINE X 2)     Status: None   Collection Time    04/11/13  7:47 PM      Result Value Range Status   Specimen Description BLOOD ARM RIGHT   Final   Special Requests BOTTLES DRAWN AEROBIC ONLY 5CC   Final   Culture  Setup Time     Final   Value: 04/12/2013 06:47     Performed at [facility]   Culture     Final   Value:        BLOOD CULTURE RECEIVED NO GROWTH TO DATE CULTURE WILL BE HELD FOR 5 DAYS BEFORE ISSUING A FINAL NEGATIVE REPORT     Performed at [facility]   Report Status PENDING   Incomplete  URINE CULTURE     Status: None   Collection Time    04/11/13  8:36 PM      Result Value Range Status   Specimen Description URINE, RANDOM   Final   Special Requests NONE   Final   Culture  Setup Time     Final   Value: 04/11/2013 22:17     Performed at [facility] Count     Final   Value: NO GROWTH     Performed at [facility]   Culture     Final   Value: NO GROWTH     Performed at [facility]   Report Status 04/13/2013 FINAL   Final  MRSA PCR SCREENING     Status: None   Collection Time    04/12/13  3:00 AM      Result Value Range Status   MRSA by PCR NEGATIVE  NEGATIVE Final   Comment:            The GeneXpert MRSA Assay (FDA     approved for NASAL  specimens     only), is one component of a     comprehensive MRSA colonization     surveillance program. It is not     intended to diagnose MRSA     infection nor to guide or     monitor treatment for     MRSA infections.     Labs: Basic Metabolic Panel:  Recent Labs Lab 04/11/13 1854  04/12/13 0535 04/13/13 0400 04/14/13 0520 04/15/13 0540  NA 130* 136 138 137 135  K 4.4 3.9 4.0 4.0 3.6  CL 91* 99 106 104 99  CO2 22 19 18* 19 24  GLUCOSE 78 57* 108* 103* 104*  BUN 42* 37* 30* 21 16  CREATININE 1.96* 1.69* 1.40* 1.21* 1.13*  CALCIUM 9.1 8.6 7.8* 8.6 9.2   Liver Function Tests:  Recent Labs Lab 04/11/13 1854  AST 24  ALT 28  ALKPHOS 69  BILITOT 0.6  PROT 7.6  ALBUMIN 3.4*   No results found for this basename: LIPASE, AMYLASE,  in the last 168 hours No results found for this basename: AMMONIA,  in the last 168 hours CBC:  Recent Labs Lab 04/11/13 1854 04/12/13 0535 04/13/13 0400 04/14/13 0520 04/15/13 0540  WBC 21.7* 19.6* 13.9* 12.3* 12.5*  HGB 11.6* 11.1* 9.0* 9.7* 10.3*  HCT 32.9* 32.5* 26.8* 28.2* 29.7*  MCV 87.5 88.3 89.6 90.1 88.4  PLT 286 248 266 261 312   Cardiac Enzymes:  Recent Labs Lab 04/11/13 1854  TROPONINI <0.30   BNP: BNP (last 3 results) No results found for this basename: PROBNP,  in the last 8760 hours CBG: No results found for this basename: GLUCAP,  in the last 168 hours     Signed:  Chardae Mulkern  [facility] 04/15/2013, 10:29 AM

## 2013-04-18 LAB — CULTURE, BLOOD (ROUTINE X 2): Culture: NO GROWTH

## 2013-04-23 ENCOUNTER — Encounter: Payer: Self-pay | Admitting: Neurology

## 2013-05-13 ENCOUNTER — Encounter: Payer: Self-pay | Admitting: Neurology

## 2013-05-13 ENCOUNTER — Ambulatory Visit (INDEPENDENT_AMBULATORY_CARE_PROVIDER_SITE_OTHER): Payer: Medicare Other | Admitting: Neurology

## 2013-05-13 VITALS — BP 125/69 | HR 57 | Wt 155.0 lb

## 2013-05-13 DIAGNOSIS — G118 Other hereditary ataxias: Secondary | ICD-10-CM | POA: Insufficient documentation

## 2013-05-13 HISTORY — DX: Other hereditary ataxias: G11.8

## 2013-05-13 MED ORDER — CARBAMAZEPINE 100 MG PO CHEW: CHEWABLE_TABLET | ORAL | Status: DC

## 2013-05-13 NOTE — Progress Notes (Signed)
Reason for visit: Spinocerebellar ataxia  Wendy Erickson is an 72 y.o. female  History of present illness:  Wendy Erickson is a 72 year old left-handed white female with a history of SCA type 3. The patient has a family history of involvement in her father, and 2 siblings. The patient indicates that her symptoms began around age 65, and have progressed from there. The patient is wheelchair-bound at this point, essentially unable to ambulate independently. The patient has a motorized wheelchair that she will use in the home environment. The patient recently was in hospital around the 4th of September 2014 with acute on chronic renal failure. The patient currently is in an extended care facility. The patient has fallen twice this year and fractured her right foot. The patient has a prior history of renal cell carcinoma, with a left nephrectomy. The patient reports that she had shingles involving the right V1 distribution, and she still has discomfort associated with this associated with paresthesias. The patient may also has a squeezing or pressure sensation around the right side of the head. The patient indicates that the gabapentin has not helped her discomfort. The patient reports chronic problems with diplopia, and she also reports some onset of dysphagia associated with liquids on occasion. This has been noted over the last one year.   Past Medical History  Diagnosis Date  . Hypertension   . Chronic kidney disease     left renla neoplasm  . Ataxia   . Depression   . Cancer     left renal neoplasm  . 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 Osteopenia   . Depression   . Shingles   . Foot fracture, right   . 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 Tonsillectomy    . Abdominal hysterectomy    . Knee arthroscopy      bilateral   . Dilation and curettage of uterus    . Back surgery    . Laparoscopic nephrectomy  10/06/2011   Procedure: LAPAROSCOPIC NEPHRECTOMY;  Surgeon: Crecencio Mc, MD;  Location: [facility];  Service: Urology;  Laterality: Left;  LEFT LAPAROSCOPIC RADICAL NEPHRECTOMY      Family History  Problem Relation Age of Onset  . Ataxia Father   . Dementia Mother     Social history:  reports that she quit smoking about 7 years ago. She has never used smokeless tobacco. She reports that  drinks alcohol. She reports that she does not use illicit drugs.   Not on File  Medications:  Current Outpatient Prescriptions on File Prior to Visit  Medication Sig Dispense Refill  . buPROPion (WELLBUTRIN XL) 150 MG 24 hr tablet Take 150 mg by mouth every morning.      . cholecalciferol (VITAMIN D) 1000 UNITS tablet Take 2,000 Units by mouth every morning.      . cloNIDine (CATAPRES) 0.1 MG tablet Take 2 tablets (0.2 mg total) by mouth daily.  60 tablet  11  . FLUoxetine HCl (PROZAC PO) Take 20 mg by mouth daily.       Marland Kitchen gabapentin (NEURONTIN) 300 MG capsule Take 300 mg by mouth 2 (two) times daily.       Marland Kitchen HYDROcodone-acetaminophen (NORCO/VICODIN) 5-325 MG per tablet Take 1 tablet by mouth every 4 (four) hours as needed for pain.  20 tablet  0  . omeprazole (PRILOSEC) 20 MG capsule Take 20 mg by mouth 2 (two) times daily.      Marland Kitchen  polycarbophil (FIBERCON) 625 MG tablet Take 625 mg by mouth daily.      . traMADol-acetaminophen (ULTRACET) 37.5-325 MG per tablet Take 37.5-352 tablets by mouth as needed.      . trandolapril (MAVIK) 1 MG tablet Take 1 mg by mouth daily.       No current facility-administered medications on file prior to visit.    ROS:  Out of a complete 14 system review of symptoms, the patient complains only of the following symptoms, and all other reviewed systems are negative.  Fevers, chills Dizziness, difficulty swallowing Itching sensation, right head Blurred vision, double vision Constipation Headache, numbness, weakness, slurred speech Depression, hypersomnolence, decreased energy, change  in appetite, disinterest in activities  Blood pressure 125/69, pulse 57, weight 155 lb (70.308 kg).  Physical Exam  General: The patient is alert and cooperative at the time of the examination.  Skin: No significant peripheral edema is noted.   Neurologic Exam  Cranial nerves: Facial symmetry is present. Speech is dysarthric, not aphasic. Extraocular movements are full, but there is divergence of gaze with superior gaze, and prominent coarse nystagmus in all fields of gaze.Jill Alexanders fields are full.  Motor: The patient has good strength in all 4 extremities.  Coordination: The patient has dysmetria with finger nose finger and heel-to-shin bilaterally  Gait and station: The patient requires assistance with standing. Once up, the patient is unable to stand by herself. Gait is somewhat wide-based, very unsteady.  Reflexes: Deep tendon reflexes are symmetric.   Assessment/Plan:  One. SCA type 3  2. Peripheral neuropathy associated with SCA  3. Right V1 distribution post herpetic neuralgia  4. Gait disorder  5. Anxiety and depression  The patient will be placed on carbamazepine for the ongoing discomfort involving the right sided postherpetic neuralgia. The patient is not having a lot of discomfort in the feet associated with her peripheral neuropathy, and the head pain is her major issue. The patient has a severe gait disorder, and she should not be ambulating without assistance. The patient will followup through this office in about 4 months. A modified barium swallow may need to be done in the future to evaluate her dysphagia.  Marlan Palau MD 05/13/2013 11:44 AM  [facility] [address] [place], [place] [address]  Phone [phone] Fax [phone]

## 2013-05-22 ENCOUNTER — Other Ambulatory Visit (HOSPITAL_COMMUNITY): Payer: Self-pay | Admitting: Urology

## 2013-05-22 ENCOUNTER — Ambulatory Visit (HOSPITAL_COMMUNITY)
Admission: RE | Admit: 2013-05-22 | Discharge: 2013-05-22 | Disposition: A | Discharge status: Home / Self Care | Payer: Medicare Other | Source: Ambulatory Visit | Attending: Urology | Admitting: Urology

## 2013-05-22 DIAGNOSIS — C649 Malignant neoplasm of unspecified kidney, except renal pelvis: Secondary | ICD-10-CM

## 2013-05-22 DIAGNOSIS — R0602 Shortness of breath: Secondary | ICD-10-CM | POA: Insufficient documentation

## 2013-05-22 DIAGNOSIS — R918 Other nonspecific abnormal finding of lung field: Secondary | ICD-10-CM | POA: Insufficient documentation

## 2013-06-19 ENCOUNTER — Other Ambulatory Visit: Payer: Self-pay | Admitting: Geriatric Medicine

## 2013-06-19 DIAGNOSIS — N281 Cyst of kidney, acquired: Secondary | ICD-10-CM

## 2013-06-21 ENCOUNTER — Ambulatory Visit
Admission: RE | Admit: 2013-06-21 | Discharge: 2013-06-21 | Disposition: A | Discharge status: Home / Self Care | Payer: Medicare Other | Source: Ambulatory Visit | Attending: Geriatric Medicine | Admitting: Geriatric Medicine

## 2013-06-21 DIAGNOSIS — N281 Cyst of kidney, acquired: Secondary | ICD-10-CM

## 2013-08-20 ENCOUNTER — Ambulatory Visit (HOSPITAL_COMMUNITY)
Admission: RE | Admit: 2013-08-20 | Discharge: 2013-08-20 | Disposition: A | Discharge status: Home / Self Care | Payer: Medicare Other | Source: Ambulatory Visit | Attending: Vascular Surgery | Admitting: Vascular Surgery

## 2013-08-20 ENCOUNTER — Other Ambulatory Visit (HOSPITAL_COMMUNITY): Payer: Self-pay | Admitting: Geriatric Medicine

## 2013-08-20 DIAGNOSIS — I70209 Unspecified atherosclerosis of native arteries of extremities, unspecified extremity: Secondary | ICD-10-CM | POA: Insufficient documentation

## 2013-09-26 ENCOUNTER — Telehealth: Payer: Self-pay | Admitting: Neurology

## 2013-09-26 NOTE — Telephone Encounter (Signed)
I called patient. The patient has SCA type 3, and she has had some issues with a peripheral neuropathy and constipation. The patient is currently in a rehabilitation center. The patient has had severe constipation over the last week, and she just recently was placed on MiraLax and milk of magnesia. The patient does take MiraLax daily. If this is not helpful, we may consider medication such as Linzess in the future.

## 2013-09-26 NOTE — Telephone Encounter (Signed)
Patient is calling stating that she is in a lot of pain and would like for you to give her call please. Thanks

## 2013-09-26 NOTE — Telephone Encounter (Signed)
Patient calling to state that she is at the care center at [facility] and she has noticed that she has been having lots of pain in her chest and back muscles. Patient also states she is having trouble with bowel movements, states that the last time she had one was last Wednesday and that it was so painful she is scared to try again. Please call patient and advise.

## 2013-11-07 NOTE — Telephone Encounter (Signed)
Closing encounter

## 2013-11-13 ENCOUNTER — Encounter (INDEPENDENT_AMBULATORY_CARE_PROVIDER_SITE_OTHER): Payer: Self-pay

## 2013-11-13 ENCOUNTER — Encounter: Payer: Self-pay | Admitting: Neurology

## 2013-11-13 ENCOUNTER — Ambulatory Visit (INDEPENDENT_AMBULATORY_CARE_PROVIDER_SITE_OTHER): Payer: Medicare Other | Admitting: Neurology

## 2013-11-13 VITALS — BP 142/75 | HR 54

## 2013-11-13 DIAGNOSIS — G118 Other hereditary ataxias: Secondary | ICD-10-CM

## 2013-11-13 DIAGNOSIS — Z5181 Encounter for therapeutic drug level monitoring: Secondary | ICD-10-CM

## 2013-11-13 MED ORDER — CARBAMAZEPINE 100 MG PO CHEW: 100.0000 mg | CHEWABLE_TABLET | Freq: Two times a day (BID) | ORAL | Status: DC

## 2013-11-13 NOTE — Progress Notes (Signed)
Reason for visit: Spinocerebellar ataxia  Wendy Erickson is an 73 y.o. female  History of present illness:  Wendy Erickson is a 73 year old left-handed white female with a history of spinocerebellar ataxia type III. The patient has had progressive ataxia of the extremities, and she currently is essentially nonambulatory. The patient fell last evening when she stood up to make coffee, and she fell backwards. The patient generally is in a wheelchair or a scooter throughout the day. The patient indicates that the dysmetria affecting the arms is gradually getting worse. The patient has right V1 distribution postherpetic neuralgia that has been improved with the use of very low-dose carbamazepine. The patient is on 100 mg at carbamazepine twice daily. The patient indicates that the medication makes her feel better in general. The patient is having ongoing problems with pain in the feet at nighttime associated with her peripheral neuropathy. The patient returns to this office for an evaluation. The patient indicates that she may have some arterial circulation problems with the legs.  Past Medical History  Diagnosis Date  . Hypertension   . Chronic kidney disease     left renla neoplasm  . Ataxia   . Depression   . Cancer     left renal neoplasm  . 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 Osteopenia   . Depression   . Shingles     Right V1 distribution  . Foot fracture, right   . 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 Peripheral neuropathy   . Gait disorder     Past Surgical History  Procedure Laterality Date  . Tonsillectomy    . Abdominal hysterectomy    . Knee arthroscopy      bilateral   . Dilation and curettage of uterus    . Back surgery    . Laparoscopic nephrectomy  10/06/2011    Procedure: LAPAROSCOPIC NEPHRECTOMY;  Surgeon: Dutch Gray, MD;  Location: [facility];  Service: Urology;  Laterality: Left;  LEFT LAPAROSCOPIC RADICAL NEPHRECTOMY      Family History    Problem Relation Age of Onset  . Ataxia Father   . Dementia Mother     Social history:  reports that she quit smoking about 8 years ago. She has never used smokeless tobacco. She reports that she drinks alcohol. She reports that she does not use illicit drugs.   No Known Allergies  Medications:  Current Outpatient Prescriptions on File Prior to Visit  Medication Sig Dispense Refill  . cholecalciferol (VITAMIN D) 1000 UNITS tablet Take 2,000 Units by mouth every morning.      . cloNIDine (CATAPRES) 0.2 MG tablet Take 0.2 mg by mouth 2 (two) times daily.      . fluconazole (DIFLUCAN) 100 MG tablet Take 100 mg by mouth daily. Use for thrush 05/10/13 #14      . FLUoxetine (PROZAC) 20 MG capsule Take 20 mg by mouth daily.      Marland Kitchen gabapentin (NEURONTIN) 300 MG capsule Take 300 mg by mouth 2 (two) times daily.       Marland Kitchen HYDROcodone-acetaminophen (NORCO/VICODIN) 5-325 MG per tablet Take 1 tablet by mouth every 4 (four) hours as needed for pain.  20 tablet  0  . Melatonin 3 MG TABS Take 3 mg by mouth at bedtime as needed.      Marland Kitchen omeprazole (PRILOSEC) 20 MG capsule Take 20 mg by mouth 2 (two) times daily.      . polycarbophil (FIBERCON) 625 MG tablet Take  625 mg by mouth daily.      Marland Kitchen lidocaine (LIDODERM) 5 % Place 1 patch onto the skin daily. Remove & Discard patch within 12 hours or as directed by MD      . traMADol-acetaminophen (ULTRACET) 37.5-325 MG per tablet Take 37.5-352 tablets by mouth as needed.      . trandolapril (MAVIK) 1 MG tablet Take 1 mg by mouth daily.       No current facility-administered medications on file prior to visit.    ROS:  Out of a complete 14 system review of symptoms, the patient complains only of the following symptoms, and all other reviewed systems are negative.  Appetite change, chills, fever, fatigue Neck stiffness Trouble swallowing Eye itching, double vision, loss of vision Leg swelling Excessive thirst Abdominal pain, constipation Daytime  sleepiness Difficulty urinating Joint swelling, back pain, walking difficulty, coordination problem Bruising easily Dizziness, headache, numbness, speech difficulty, weakness Decreased concentration, depression  Blood pressure 142/75, pulse 54, weight 0 lb (0 kg).  Physical Exam  General: The patient is alert and cooperative at the time of the examination.  Skin: No significant peripheral edema is noted.   Neurologic Exam  Mental status: The patient is oriented x 3.  Cranial nerves: Facial symmetry is present. Speech is slightly ataxic. Extraocular movements are full. Prominent nystagmus is seen with end gaze bilaterally with horizontal gaze. Visual fields are full.  Motor: The patient has good strength in all 4 extremities.  Sensory examination: Soft touch sensation is symmetric on the face, arms, and legs.  Coordination: The has significant dysmetria with finger-nose-finger and heel-to-shin bilaterally.  Gait and station: The patient was not ambulated, she is wheelchair-bound.  Reflexes: Deep tendon reflexes are symmetric.   Assessment/Plan:  1. Spinal cerebellar ataxia type III  2. Peripheral neuropathy   3. Gait disturbance  The patient is having ongoing discomfort in the feet associated with her peripheral neuropathy. The patient will receive a compounded topical ointment for this issue. The patient will continue the carbamazepine taking 100 mg twice daily. The patient will followup in about 6 months. Blood work will be done today given the carbamazepine dosing.  Jill Alexanders MD 11/13/2013 8:53 PM  [facility] [address] [place], [place] [address]  Phone [phone] Fax [phone]

## 2013-11-13 NOTE — Patient Instructions (Signed)

## 2013-11-14 LAB — CBC WITH DIFFERENTIAL
BASOS ABS: 0.1 10*3/uL (ref 0.0–0.2)
Basos: 1 %
EOS ABS: 0.1 10*3/uL (ref 0.0–0.4)
Eos: 2 %
HCT: 39.2 % (ref 34.0–46.6)
Hemoglobin: 13.5 g/dL (ref 11.1–15.9)
LYMPHS ABS: 1.7 10*3/uL (ref 0.7–3.1)
LYMPHS: 28 %
MCH: 29.2 pg (ref 26.6–33.0)
MCHC: 34.4 g/dL (ref 31.5–35.7)
MCV: 85 fL (ref 79–97)
MONOCYTES: 4 %
Monocytes Absolute: 0.2 10*3/uL (ref 0.1–0.9)
Neutrophils Absolute: 3.9 10*3/uL (ref 1.4–7.0)
Neutrophils Relative %: 65 %
PLATELETS: 308 10*3/uL (ref 150–379)
RBC: 4.63 x10E6/uL (ref 3.77–5.28)
RDW: 15.1 % (ref 12.3–15.4)
WBC: 6 10*3/uL (ref 3.4–10.8)

## 2013-11-14 LAB — COMPREHENSIVE METABOLIC PANEL
A/G RATIO: 1.7 (ref 1.1–2.5)
ALK PHOS: 66 IU/L (ref 39–117)
ALT: 11 IU/L (ref 0–32)
AST: 16 IU/L (ref 0–40)
Albumin: 4.3 g/dL (ref 3.5–4.8)
BUN / CREAT RATIO: 29 — AB (ref 11–26)
BUN: 32 mg/dL — ABNORMAL HIGH (ref 8–27)
CHLORIDE: 101 mmol/L (ref 96–108)
CO2: 27 mmol/L (ref 18–29)
Calcium: 9.6 mg/dL (ref 8.7–10.3)
Creatinine, Ser: 1.12 mg/dL — ABNORMAL HIGH (ref 0.57–1.00)
GFR calc non Af Amer: 50 mL/min/{1.73_m2} — ABNORMAL LOW (ref >59)
GFR, EST AFRICAN AMERICAN: 58 mL/min/{1.73_m2} — AB (ref >59)
Globulin, Total: 2.6 g/dL (ref 1.5–4.5)
Glucose: 83 mg/dL (ref 65–99)
POTASSIUM: 5.2 mmol/L (ref 3.5–5.2)
SODIUM: 139 mmol/L (ref 134–144)
Total Protein: 6.9 g/dL (ref 6.0–8.5)

## 2013-11-14 LAB — CARBAMAZEPINE LEVEL, TOTAL: CARBAMAZEPINE LVL: 5.7 ug/mL (ref 4.0–12.0)

## 2014-05-11 ENCOUNTER — Observation Stay (HOSPITAL_COMMUNITY)
Admission: EM | Admit: 2014-05-11 | Discharge: 2014-05-14 | Disposition: A | Discharge status: Home / Self Care | Payer: Medicare Other | Attending: Internal Medicine | Admitting: Internal Medicine

## 2014-05-11 ENCOUNTER — Emergency Department (HOSPITAL_COMMUNITY): Payer: Medicare Other

## 2014-05-11 ENCOUNTER — Encounter (HOSPITAL_COMMUNITY): Payer: Self-pay | Admitting: Emergency Medicine

## 2014-05-11 DIAGNOSIS — N189 Chronic kidney disease, unspecified: Secondary | ICD-10-CM | POA: Insufficient documentation

## 2014-05-11 DIAGNOSIS — Z8619 Personal history of other infectious and parasitic diseases: Secondary | ICD-10-CM | POA: Insufficient documentation

## 2014-05-11 DIAGNOSIS — Z85528 Personal history of other malignant neoplasm of kidney: Secondary | ICD-10-CM | POA: Insufficient documentation

## 2014-05-11 DIAGNOSIS — R011 Cardiac murmur, unspecified: Secondary | ICD-10-CM | POA: Insufficient documentation

## 2014-05-11 DIAGNOSIS — Z7982 Long term (current) use of aspirin: Secondary | ICD-10-CM | POA: Insufficient documentation

## 2014-05-11 DIAGNOSIS — Z8781 Personal history of (healed) traumatic fracture: Secondary | ICD-10-CM | POA: Diagnosis not present

## 2014-05-11 DIAGNOSIS — I10 Essential (primary) hypertension: Secondary | ICD-10-CM

## 2014-05-11 DIAGNOSIS — R079 Chest pain, unspecified: Secondary | ICD-10-CM | POA: Diagnosis present

## 2014-05-11 DIAGNOSIS — G118 Other hereditary ataxias: Secondary | ICD-10-CM | POA: Insufficient documentation

## 2014-05-11 DIAGNOSIS — R748 Abnormal levels of other serum enzymes: Secondary | ICD-10-CM | POA: Diagnosis not present

## 2014-05-11 DIAGNOSIS — Z87891 Personal history of nicotine dependence: Secondary | ICD-10-CM | POA: Diagnosis not present

## 2014-05-11 DIAGNOSIS — K8020 Calculus of gallbladder without cholecystitis without obstruction: Secondary | ICD-10-CM | POA: Diagnosis present

## 2014-05-11 DIAGNOSIS — M8580 Other specified disorders of bone density and structure, unspecified site: Secondary | ICD-10-CM | POA: Diagnosis not present

## 2014-05-11 DIAGNOSIS — M1990 Unspecified osteoarthritis, unspecified site: Secondary | ICD-10-CM | POA: Diagnosis not present

## 2014-05-11 DIAGNOSIS — I129 Hypertensive chronic kidney disease with stage 1 through stage 4 chronic kidney disease, or unspecified chronic kidney disease: Secondary | ICD-10-CM | POA: Diagnosis not present

## 2014-05-11 DIAGNOSIS — Z79899 Other long term (current) drug therapy: Secondary | ICD-10-CM | POA: Diagnosis not present

## 2014-05-11 DIAGNOSIS — R1013 Epigastric pain: Secondary | ICD-10-CM

## 2014-05-11 DIAGNOSIS — F329 Major depressive disorder, single episode, unspecified: Secondary | ICD-10-CM | POA: Insufficient documentation

## 2014-05-11 DIAGNOSIS — G629 Polyneuropathy, unspecified: Secondary | ICD-10-CM | POA: Diagnosis not present

## 2014-05-11 DIAGNOSIS — N179 Acute kidney failure, unspecified: Secondary | ICD-10-CM

## 2014-05-11 DIAGNOSIS — M109 Gout, unspecified: Secondary | ICD-10-CM | POA: Diagnosis not present

## 2014-05-11 DIAGNOSIS — R0789 Other chest pain: Secondary | ICD-10-CM | POA: Diagnosis present

## 2014-05-11 HISTORY — DX: Cardiac murmur, unspecified: R01.1

## 2014-05-11 HISTORY — DX: Unspecified osteoarthritis, unspecified site: M19.90

## 2014-05-11 HISTORY — DX: Pure hypercholesterolemia, unspecified: E78.00

## 2014-05-11 HISTORY — DX: Malignant neoplasm of unspecified kidney, except renal pelvis: C64.9

## 2014-05-11 HISTORY — DX: Personal history of other diseases of the musculoskeletal system and connective tissue: Z87.39

## 2014-05-11 LAB — CBC
HCT: 36.9 % (ref 36.0–46.0)
HCT: 38.3 % (ref 36.0–46.0)
HEMOGLOBIN: 12.4 g/dL (ref 12.0–15.0)
Hemoglobin: 12.9 g/dL (ref 12.0–15.0)
MCH: 30 pg (ref 26.0–34.0)
MCH: 30.1 pg (ref 26.0–34.0)
MCHC: 33.6 g/dL (ref 30.0–36.0)
MCHC: 33.7 g/dL (ref 30.0–36.0)
MCV: 89.1 fL (ref 78.0–100.0)
MCV: 89.5 fL (ref 78.0–100.0)
PLATELETS: 263 10*3/uL (ref 150–400)
PLATELETS: 253 10*3/uL (ref 150–400)
RBC: 4.14 MIL/uL (ref 3.87–5.11)
RBC: 4.28 MIL/uL (ref 3.87–5.11)
RDW: 13.2 % (ref 11.5–15.5)
RDW: 13.3 % (ref 11.5–15.5)
WBC: 5.6 10*3/uL (ref 4.0–10.5)
WBC: 9.6 10*3/uL (ref 4.0–10.5)

## 2014-05-11 LAB — COMPREHENSIVE METABOLIC PANEL
ALT: 10 U/L (ref 0–35)
AST: 15 U/L (ref 0–37)
Albumin: 3.7 g/dL (ref 3.5–5.2)
Alkaline Phosphatase: 49 U/L (ref 39–117)
Anion gap: 9 (ref 5–15)
BUN: 34 mg/dL — ABNORMAL HIGH (ref 6–23)
CHLORIDE: 102 meq/L (ref 96–112)
CO2: 31 mEq/L (ref 19–32)
CREATININE: 1.29 mg/dL — AB (ref 0.50–1.10)
Calcium: 9.9 mg/dL (ref 8.4–10.5)
GFR calc Af Amer: 47 mL/min — ABNORMAL LOW (ref >90)
GFR calc non Af Amer: 41 mL/min — ABNORMAL LOW (ref >90)
Glucose, Bld: 86 mg/dL (ref 70–99)
Potassium: 4.8 mEq/L (ref 3.7–5.3)
Sodium: 142 mEq/L (ref 137–147)
TOTAL PROTEIN: 7 g/dL (ref 6.0–8.3)

## 2014-05-11 LAB — LIPASE, BLOOD: Lipase: 67 U/L — ABNORMAL HIGH (ref 11–59)

## 2014-05-11 LAB — URINALYSIS, ROUTINE W REFLEX MICROSCOPIC
Bilirubin Urine: NEGATIVE
Glucose, UA: NEGATIVE mg/dL
Hgb urine dipstick: NEGATIVE
Ketones, ur: NEGATIVE mg/dL
NITRITE: POSITIVE — AB
Protein, ur: NEGATIVE mg/dL
SPECIFIC GRAVITY, URINE: 1.013 (ref 1.005–1.030)
UROBILINOGEN UA: 0.2 mg/dL (ref 0.0–1.0)
pH: 6.5 (ref 5.0–8.0)

## 2014-05-11 LAB — I-STAT CHEM 8, ED
BUN: 34 mg/dL — ABNORMAL HIGH (ref 6–23)
Calcium, Ion: 1.29 mmol/L (ref 1.13–1.30)
Chloride: 103 mEq/L (ref 96–112)
Creatinine, Ser: 1.4 mg/dL — ABNORMAL HIGH (ref 0.50–1.10)
Glucose, Bld: 87 mg/dL (ref 70–99)
HEMATOCRIT: 38 % (ref 36.0–46.0)
HEMOGLOBIN: 12.9 g/dL (ref 12.0–15.0)
POTASSIUM: 4.5 meq/L (ref 3.7–5.3)
Sodium: 140 mEq/L (ref 137–147)
TCO2: 31 mmol/L (ref 0–100)

## 2014-05-11 LAB — TROPONIN I: Troponin I: <0.3 ng/mL (ref <0.30)

## 2014-05-11 LAB — CREATININE, SERUM
Creatinine, Ser: 1.28 mg/dL — ABNORMAL HIGH (ref 0.50–1.10)
GFR, EST AFRICAN AMERICAN: 48 mL/min — AB (ref >90)
GFR, EST NON AFRICAN AMERICAN: 41 mL/min — AB (ref >90)

## 2014-05-11 LAB — URINE MICROSCOPIC-ADD ON

## 2014-05-11 MED ORDER — SODIUM CHLORIDE 0.9 % IV SOLN
INTRAVENOUS | Status: AC
  Administered 2014-05-11: 10:00:00 via INTRAVENOUS

## 2014-05-11 MED ORDER — ATORVASTATIN CALCIUM 20 MG PO TABS
20.0000 mg | ORAL_TABLET | Freq: Every day | ORAL | Status: DC
  Administered 2014-05-12 – 2014-05-14 (×3): 20 mg via ORAL
  Filled 2014-05-11 (×3): qty 1

## 2014-05-11 MED ORDER — CLONIDINE HCL 0.2 MG PO TABS
0.2000 mg | ORAL_TABLET | Freq: Every day | ORAL | Status: DC
  Administered 2014-05-12: 0.2 mg via ORAL
  Filled 2014-05-11 (×2): qty 1

## 2014-05-11 MED ORDER — ACETAMINOPHEN 325 MG PO TABS: 650.0000 mg | ORAL_TABLET | ORAL | Status: DC | PRN

## 2014-05-11 MED ORDER — GI COCKTAIL ~~LOC~~
30.0000 mL | Freq: Four times a day (QID) | ORAL | Status: DC | PRN
  Administered 2014-05-11: 30 mL via ORAL
  Filled 2014-05-11: qty 30

## 2014-05-11 MED ORDER — ZOLPIDEM TARTRATE 5 MG PO TABS
5.0000 mg | ORAL_TABLET | Freq: Every evening | ORAL | Status: DC | PRN
  Administered 2014-05-11 – 2014-05-13 (×3): 5 mg via ORAL
  Filled 2014-05-11 (×3): qty 1

## 2014-05-11 MED ORDER — TRAMADOL-ACETAMINOPHEN 37.5-325 MG PO TABS
1.0000 | ORAL_TABLET | Freq: Four times a day (QID) | ORAL | Status: DC | PRN
  Administered 2014-05-11 – 2014-05-14 (×3): 1 via ORAL
  Filled 2014-05-11 (×3): qty 1

## 2014-05-11 MED ORDER — CALCIUM POLYCARBOPHIL 625 MG PO TABS
625.0000 mg | ORAL_TABLET | Freq: Every day | ORAL | Status: DC
  Administered 2014-05-12 – 2014-05-14 (×3): 625 mg via ORAL
  Filled 2014-05-11 (×3): qty 1

## 2014-05-11 MED ORDER — NITROGLYCERIN 2 % TD OINT
1.0000 [in_us] | TOPICAL_OINTMENT | Freq: Once | TRANSDERMAL | Status: AC
  Administered 2014-05-11: 1 [in_us] via TOPICAL
  Filled 2014-05-11: qty 1

## 2014-05-11 MED ORDER — PANTOPRAZOLE SODIUM 40 MG PO TBEC
40.0000 mg | DELAYED_RELEASE_TABLET | Freq: Every day | ORAL | Status: DC
  Administered 2014-05-12: 40 mg via ORAL
  Filled 2014-05-11: qty 1

## 2014-05-11 MED ORDER — ONDANSETRON HCL 4 MG/2ML IJ SOLN: 4.0000 mg | Freq: Four times a day (QID) | INTRAMUSCULAR | Status: DC | PRN

## 2014-05-11 MED ORDER — MELATONIN 3 MG PO TABS: 3.0000 mg | ORAL_TABLET | Freq: Every evening | ORAL | Status: DC | PRN

## 2014-05-11 MED ORDER — CARBAMAZEPINE 100 MG PO CHEW
100.0000 mg | CHEWABLE_TABLET | Freq: Two times a day (BID) | ORAL | Status: DC
  Administered 2014-05-11 – 2014-05-14 (×6): 100 mg via ORAL
  Filled 2014-05-11 (×7): qty 1

## 2014-05-11 MED ORDER — ASPIRIN EC 325 MG PO TBEC
325.0000 mg | DELAYED_RELEASE_TABLET | Freq: Every day | ORAL | Status: DC
  Administered 2014-05-11 – 2014-05-14 (×4): 325 mg via ORAL
  Filled 2014-05-11 (×4): qty 1

## 2014-05-11 MED ORDER — HEPARIN SODIUM (PORCINE) 5000 UNIT/ML IJ SOLN
5000.0000 [IU] | Freq: Three times a day (TID) | INTRAMUSCULAR | Status: DC
  Administered 2014-05-11 – 2014-05-14 (×9): 5000 [IU] via SUBCUTANEOUS
  Filled 2014-05-11 (×11): qty 1

## 2014-05-11 MED ORDER — GABAPENTIN 300 MG PO CAPS
300.0000 mg | ORAL_CAPSULE | Freq: Two times a day (BID) | ORAL | Status: DC
  Administered 2014-05-11 – 2014-05-14 (×6): 300 mg via ORAL
  Filled 2014-05-11 (×7): qty 1

## 2014-05-11 MED ORDER — SENNA 8.6 MG PO TABS
1.0000 | ORAL_TABLET | Freq: Two times a day (BID) | ORAL | Status: DC
  Administered 2014-05-12 – 2014-05-14 (×6): 8.6 mg via ORAL
  Filled 2014-05-11 (×7): qty 1

## 2014-05-11 MED ORDER — FLUOXETINE HCL 20 MG PO CAPS
20.0000 mg | ORAL_CAPSULE | Freq: Every day | ORAL | Status: DC
  Administered 2014-05-12 – 2014-05-14 (×3): 20 mg via ORAL
  Filled 2014-05-11 (×3): qty 1

## 2014-05-11 MED ORDER — MORPHINE SULFATE 4 MG/ML IJ SOLN
4.0000 mg | Freq: Once | INTRAMUSCULAR | Status: AC
  Administered 2014-05-11: 4 mg via INTRAVENOUS
  Filled 2014-05-11: qty 1

## 2014-05-11 NOTE — ED Provider Notes (Signed)
History of Present Illness   Patient Identification Wendy Erickson is a 73 y.o. female.  Patient information was obtained from patient, EMS personnel and past medical records. History/Exam limitations: none. Patient presented to the Emergency Department by ambulance where the patient received ASA 324, NTG prior to arrival.  Chief Complaint  Chest Pain   The patient complains of chest pain and chest pressure/discomfort.She has a PMH ofspinocerebellar ataxi (followed by [facility]).  The discomfort is described as chest tightness, heavy, without radiation. Onset of symptoms was abrupt starting at 4AM awoke patient from sleep. ago, pain resolved with ASA course since that time. Nothing provokes the sxs. Relieved by asa and ntg. The patient also complains of associated SOB. The patient denies headache, fever, cough, hemoptysis, abdominal pain, nausea, vomiting, back pain and recent procedures, exogenous estrogens. Patient's cardiac risk factors are advanced age (older than 22 for men, 16 for women), hypertension and 30 pack year smoking hxm quit 2006.  Takes clonidine for bp, she took all of her medications at 4:00 am when she awoke. She does have a hx of GERD.  Past Medical History  Diagnosis Date  . Hypertension   . Chronic kidney disease     left renla neoplasm  . Ataxia   . Depression   . Cancer     left renal neoplasm  . 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 Osteopenia   . Depression   . Shingles     Right V1 distribution  . Foot fracture, right   . 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 Peripheral neuropathy   . Gait disorder    Family History  Problem Relation Age of Onset  . Ataxia Father   . Dementia Mother    Current Facility-Administered Medications  Medication Dose Route Frequency Provider Last Rate Last Dose  . morphine 4 MG/ML injection 4 mg  4 mg Intravenous Once Topaz Raglin, PA-C      . nitroGLYCERIN (NITROGLYN) 2 % ointment 1 inch  1 inch  Topical Once Margarita Mail, PA-C       Current Outpatient Prescriptions  Medication Sig Dispense Refill  . acetaminophen (TYLENOL) 650 MG CR tablet Take 650 mg by mouth every 8 (eight) hours as needed for pain.      Marland Kitchen aspirin 81 MG tablet Take 81 mg by mouth daily.      Marland Kitchen atorvastatin (LIPITOR) 20 MG tablet Take 20 mg by mouth daily.      . Calcium Carbonate-Vitamin D (CALCARB 600/D) 600-400 MG-UNIT per tablet Take 1 tablet by mouth daily.      . carbamazepine (TEGRETOL) 100 MG chewable tablet Chew 1 tablet (100 mg total) by mouth 2 (two) times daily.  60 tablet  5  . chlorpheniramine (ALLERGY) 4 MG tablet Take 4 mg by mouth 2 (two) times daily as needed for allergies.      . cholecalciferol (VITAMIN D) 1000 UNITS tablet Take 2,000 Units by mouth every morning.      . cloNIDine (CATAPRES) 0.2 MG tablet Take 0.2 mg by mouth 2 (two) times daily.      . ferrous sulfate 325 (65 FE) MG EC tablet Take 325 mg by mouth daily with breakfast.      . fluconazole (DIFLUCAN) 100 MG tablet Take 100 mg by mouth daily. Use for thrush 05/10/13 #14      . FLUoxetine (PROZAC) 20 MG capsule Take 20 mg by mouth daily.      Marland Kitchen gabapentin (NEURONTIN) 300  MG capsule Take 300 mg by mouth 2 (two) times daily.       Marland Kitchen HYDROcodone-acetaminophen (NORCO/VICODIN) 5-325 MG per tablet Take 1 tablet by mouth every 4 (four) hours as needed for pain.  20 tablet  0  . lidocaine (LIDODERM) 5 % Place 1 patch onto the skin daily. Remove & Discard patch within 12 hours or as directed by MD      . Rolan Lipa 145 MCG CAPS capsule Take 145 mcg by mouth daily.      . Melatonin 3 MG TABS Take 3 mg by mouth at bedtime as needed.      Marland Kitchen omeprazole (PRILOSEC) 20 MG capsule Take 20 mg by mouth 2 (two) times daily.      . polycarbophil (FIBERCON) 625 MG tablet Take 625 mg by mouth daily.      . polyethylene glycol powder (GLYCOLAX/MIRALAX) powder Take 3,350 g by mouth as needed.      . traMADol-acetaminophen (ULTRACET) 37.5-325 MG per tablet Take  37.5-352 tablets by mouth as needed.      . trandolapril (MAVIK) 1 MG tablet Take 1 mg by mouth daily.      Marland Kitchen zolpidem (AMBIEN) 5 MG tablet Take 5 mg by mouth at bedtime and may repeat dose one time if needed.       No Known Allergies History   Social History  . Marital Status: Single    Spouse Name: N/A    Number of Children: 1  . Years of Education: college 2   Occupational History  . retired    Social History Main Topics  . Smoking status: Former Smoker    Quit date: 08/08/2005  . Smokeless tobacco: Never Used     Comment: quit 1999  . Alcohol Use: Yes     Comment: wine on Friday  . Drug Use: No  . Sexual Activity: Not on file   Other Topics Concern  . Not on file   Social History Narrative  . No narrative on file   Review of Systems A comprehensive review of systems was negative except for: Cardiovascular: positive for chest pain, chest pressure/discomfort and dyspnea   Physical Exam   BP 180/82  Pulse 63  Temp(Src) 98.5 F (36.9 C) (Oral)  Resp 20  SpO2 96% BP 180/82  Pulse 63  Temp(Src) 98.5 F (36.9 C) (Oral)  Resp 20  SpO2 96%  General Appearance:    Alert, cooperative, no distress, appears stated age  Head:    Normocephalic, without obvious abnormality, atraumatic  Eyes:    PERRL, conjunctiva/corneas clear, EOM's intact, fundi    benign, both eyes  Ears:    Normal external Ears  Nose:    no drainage    or sinus tenderness  Throat:   Lips, mucosa, and tongue normal; teeth and gums normal  Neck:   Supple, symmetrical, trachea midline, no adenopathy;    thyroid:  no enlargement/tenderness/nodules; no carotid   bruit or JVD  Back:     Symmetric, no curvature, ROM normal, no CVA tenderness  Lungs:     Clear to auscultation bilaterally, respirations unlabored  Chest Wall:    No tenderness or deformity   Heart:    Regular rate and rhythm, S1 and S2 normal, no murmur, rub   or gallop     Abdomen:     Soft, non-tender, bowel sounds active all four  quadrants,    no masses, no organomegaly  Genitalia:    Normal female without lesion, discharge  or tenderness  Rectal:    Normal tone, normal prostate, no masses or tenderness;   guaiac negative stool  Extremities:   Extremities normal, atraumatic, no cyanosis or edema  Pulses:   2+ and symmetric all extremities  Skin:   Skin color, texture, turgor normal, no rashes or lesions  Lymph nodes:   Cervical, supraclavicular, and axillary nodes normal       ED Course   Results for orders placed in visit on 11/13/13  COMPREHENSIVE METABOLIC PANEL      Result Value Ref Range   Glucose 83  65 - 99 mg/dL   BUN 32 (*) 8 - 27 mg/dL   Creatinine, Ser 1.12 (*) 0.57 - 1.00 mg/dL   GFR calc non Af Amer 50 (*) >59 mL/min/1.73   GFR calc Af Amer 58 (*) >59 mL/min/1.73   BUN/Creatinine Ratio 29 (*) 11 - 26   Sodium 139  134 - 144 mmol/L   Potassium 5.2  3.5 - 5.2 mmol/L   Chloride 101  96 - 108 mmol/L   CO2 27  18 - 29 mmol/L   Calcium 9.6  8.7 - 10.3 mg/dL   Total Protein 6.9  6.0 - 8.5 g/dL   Albumin 4.3  3.5 - 4.8 g/dL   Globulin, Total 2.6  1.5 - 4.5 g/dL   Albumin/Globulin Ratio 1.7  1.1 - 2.5   Total Bilirubin <0.2  0.0 - 1.2 mg/dL   Alkaline Phosphatase 66  39 - 117 IU/L   AST 16  0 - 40 IU/L   ALT 11  0 - 32 IU/L  CBC WITH DIFFERENTIAL/PLATELET      Result Value Ref Range   WBC 6.0  3.4 - 10.8 x10E3/uL   RBC 4.63  3.77 - 5.28 x10E6/uL   Hemoglobin 13.5  11.1 - 15.9 g/dL   HCT 39.2  34.0 - 46.6 %   MCV 85  79 - 97 fL   MCH 29.2  26.6 - 33.0 pg   MCHC 34.4  31.5 - 35.7 g/dL   RDW 15.1  12.3 - 15.4 %   Platelets 308  150 - 379 x10E3/uL   Neutrophils Relative % 65     Lymphs 28     Monocytes 4     Eos 2     Basos 1     Neutrophils Absolute 3.9  1.4 - 7.0 x10E3/uL   Lymphocytes Absolute 1.7  0.7 - 3.1 x10E3/uL   Monocytes Absolute 0.2  0.1 - 0.9 x10E3/uL   Eosinophils Absolute 0.1  0.0 - 0.4 x10E3/uL   Basophils Absolute 0.1  0.0 - 0.2 x10E3/uL  CARBAMAZEPINE LEVEL, TOTAL       Result Value Ref Range   Carbamazepine Lvl 5.7  4.0 - 12.0 ug/mL    Date: 05/11/2014  Rate: 62  Rhythm: normal sinus rhythm  QRS Axis: normal  Intervals: normal  ST/T Wave abnormalities: indeterminate ? Worsening T wave inversions in Septal Leads  Conduction Disutrbances:none  Narrative Interpretation:   Old EKG Reviewed: Questionable worsening T wave inversion in the V1,V2  I have ordered a repeat EKG. Previous EKG from 04/2013 is a poor reading and difficult to compare to todays EKG.    Records Reviewed: Old medical records. Nursing notes.  Treatments: Aspirin given. Cardiac monitoring. Nitroglycerine transdermal. Morphine   Consultations: Internal Medicine consulted. Treatment options were discussed and plan of care agreed upon.  Disposition: Admitted to Floor telemetry the case was discussed with the admitting physician Feliz-Ortiz, Observation, CP  R/O  MDM The emergent differential diagnosis of chest pain includes: Acute coronary syndrome, pericarditis, aortic dissection, pulmonary embolism, tension pneumothorax, and esophageal rupture. Other urgent/non-acute considerations include, but are not limited to: chronic angina, aortic stenosis, cardiomyopathy, myocarditis, mitral valve prolapse, pulmonary hypertension, hypertrophic obstructive cardiomyopathy (HOCM), aortic insufficiency, right ventricular hypertrophy, pneumonia, pleuritis, bronchitis, pneumothorax, tumor, gastroesophageal reflux disease (GERD), esophageal spasm, Mallory-Weiss syndrome, peptic ulcer disease, biliary disease, pancreatitis, functional gastrointestinal pain, cervical or thoracic disk disease or arthritis, shoulder arthritis, costochondritis, subacromial bursitis, anxiety or panic attack, herpes zoster, breast disorders, chest wall tumors, thoracic outlet syndrome, mediastinitis.  I have high suspicion for ACS given the patient's risk foactors and history. I doubt highly PE as cause. Patient is not  hypoxic and no signs of DVT. SOB resolves with nitro/asa Patient seen in shared visit with attending physician. Nitro paste is applied and ASA given.  8:29 AM BP 188/73  Pulse 63  Temp(Src) 98.5 F (36.9 C) (Oral)  Resp 16  SpO2 96% Patient HTN. Creatinine elevated with no sig changes form previous, but appears mildly dehydratied. EKG is questionable for signs of worsening ischemia. CXR shows atelectasis without other acute abnormality.  9:18 AM BP 177/65  Pulse 56  Temp(Src) 98.5 F (36.9 C) (Oral)  Resp 14  SpO2 96% No significant changes noted when compared with prior ECG. Patient with normal troponin 5 hours from onset of pain. Patient current pain level 2/10  I personally reviewed the imaging tests through PACS system. I have reviewed and interpreted Lab values. I reviewed available ER/hospitalization records through the EMR   Pt feels improved after observation and/or treatment in ED. Patient / Family / Caregiver informed of clinical course, understand medical decision-making process, and agree with plan.  She also has an elevated lipase.  Margarita Mail, PA-C 05/11/14 [phone]

## 2014-05-11 NOTE — ED Notes (Signed)
EMS called to [facility] for Pt. C/o chest pain. Pain gradually got worse this am. Aspirin 324mg  po. Nitro sl X 1 with relief of pain.

## 2014-05-11 NOTE — H&P (Signed)
[facility] History and Physical  Wendy Erickson EGB:[id number] DOB: [birth year] DOA: 05/11/2014  Referring physician: Dr. Mingo Amber PCP: Mathews Argyle, MD   Chief Complaint: Chest pain  HPI: Wendy Erickson is a 73 y.o. female Past medical history renal cell carcinoma status post left nephrectomy, hypertension and depression, Tobacco he used And quit about7 years ago That comes into the ER complaining of chest pain Over the last 24 hours that woke her up from her sleep. She relates is substernal worse with movement but not with exertion, nothing makes it  Worse No relationships with food or any pattern. She denies any shortness of breath palpitation nausea or vomiting.   In the ED: She was given nitroglycerin and morphine and pain improved.  Review of Systems:  Constitutional:  No weight loss, night sweats, Fevers, chills, fatigue.  HEENT:  No headaches, Difficulty swallowing,Tooth/dental problems,Sore throat,  No sneezing, itching, ear ache, nasal congestion, post nasal drip,  Cardio-vascular:  No chest pain, Orthopnea, PND, swelling in lower extremities, anasarca, dizziness, palpitations  GI:  No heartburn, indigestion, abdominal pain, nausea, vomiting, diarrhea, change in bowel habits, loss of appetite  Resp:  No shortness of breath with exertion or at rest. No excess mucus, no productive cough, No non-productive cough, No coughing up of blood.No change in color of mucus.No wheezing.No chest wall deformity  Skin:  no rash or lesions.  GU:  no dysuria, change in color of urine, no urgency or frequency. No flank pain.  Musculoskeletal:  No joint pain or swelling. No decreased range of motion. No back pain.  Psych:  No change in mood or affect. No depression or anxiety. No memory loss.   Past Medical History  Diagnosis Date  . Hypertension   . Chronic kidney disease     left renla neoplasm  . Ataxia   . Depression   . Cancer     left renal neoplasm  . 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 Osteopenia   . Depression   . Shingles     Right V1 distribution  . Foot fracture, right   . 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 Peripheral neuropathy   . Gait disorder    Past Surgical History  Procedure Laterality Date  . Tonsillectomy    . Abdominal hysterectomy    . Knee arthroscopy      bilateral   . Dilation and curettage of uterus    . Back surgery    . Laparoscopic nephrectomy  10/06/2011    Procedure: LAPAROSCOPIC NEPHRECTOMY;  Surgeon: Dutch Gray, MD;  Location: [facility];  Service: Urology;  Laterality: Left;  LEFT LAPAROSCOPIC RADICAL NEPHRECTOMY     Social History:  reports that she quit smoking about 8 years ago. She has never used smokeless tobacco. She reports that she drinks alcohol. She reports that she does not use illicit drugs.  No Known Allergies  Family History  Problem Relation Age of Onset  . Ataxia Father   . Dementia Mother      Prior to Admission medications   Medication Sig Start Date End Date Taking? Authorizing Provider  acetaminophen (TYLENOL) 650 MG CR tablet Take 1,300 mg by mouth every 8 (eight) hours as needed for pain.    Yes Historical Provider, MD  aspirin 81 MG tablet Take 81 mg by mouth daily.   Yes Historical Provider, MD  atorvastatin (LIPITOR) 20 MG tablet Take 20 mg by mouth daily. 10/28/13  Yes Historical Provider, MD  Calcium Carbonate-Vitamin  D (CALCARB 600/D) 600-400 MG-UNIT per tablet Take 1 tablet by mouth daily.   Yes Historical Provider, MD  carbamazepine (TEGRETOL) 100 MG chewable tablet Chew 1 tablet (100 mg total) by mouth 2 (two) times daily. 11/13/13  Yes Kathrynn Ducking, MD  chlorpheniramine (ALLERGY) 4 MG tablet Take 4 mg by mouth 2 (two) times daily as needed for allergies.   Yes Historical Provider, MD  cholecalciferol (VITAMIN D) 1000 UNITS tablet Take 2,000 Units by mouth every morning.   Yes Historical Provider, MD  cloNIDine (CATAPRES) 0.2 MG tablet Take 0.2 mg by mouth  daily.    Yes Historical Provider, MD  ferrous sulfate 325 (65 FE) MG EC tablet Take 325 mg by mouth daily with breakfast.   Yes Historical Provider, MD  fluocinonide (LIDEX) 0.05 % external solution Apply 1 application topically daily. 04/10/14  Yes Historical Provider, MD  FLUoxetine (PROZAC) 20 MG capsule Take 20 mg by mouth daily.   Yes Historical Provider, MD  gabapentin (NEURONTIN) 300 MG capsule Take 300 mg by mouth 2 (two) times daily.    Yes Historical Provider, MD  Melatonin 3 MG TABS Take 3 mg by mouth at bedtime as needed (for sleep).    Yes Historical Provider, MD  omeprazole (PRILOSEC) 20 MG capsule Take 20 mg by mouth daily.    Yes Historical Provider, MD  polycarbophil (FIBERCON) 625 MG tablet Take 625 mg by mouth daily.   Yes Historical Provider, MD  senna (SENOKOT) 8.6 MG tablet Take 1 tablet by mouth 2 (two) times daily.   Yes Historical Provider, MD  traMADol-acetaminophen (ULTRACET) 37.5-325 MG per tablet Take 1 tablet by mouth every 6 (six) hours as needed for moderate pain.  02/18/13  Yes Historical Provider, MD  zolpidem (AMBIEN) 5 MG tablet Take 5 mg by mouth at bedtime as needed for sleep.  10/28/13  Yes Historical Provider, MD   Physical Exam: Filed Vitals:   05/11/14 0945 05/11/14 1010 05/11/14 1015 05/11/14 1030  BP: 132/103 165/66 158/79 150/74  Pulse: 74  56 58  Temp:      TempSrc:      Resp: 13 14 12 12   SpO2: 93% 97% 96% 93%    Wt Readings from Last 3 Encounters:  05/13/13 70.308 kg (155 lb)  04/23/13 70.761 kg (156 lb)  04/15/13 75.7 kg (166 lb 14.2 oz)    General:  Appears calm and comfortable Eyes: PERRL, normal lids, irises & conjunctiva ENT: grossly normal hearing, lips & tongue Neck: no LAD, masses or thyromegaly Cardiovascular: RRR, no m/r/g. No LE edema. Telemetry: SR, no arrhythmias  Respiratory: CTA bilaterally, no w/r/r.  Abdomen: soft, ntnd Skin: no rash or induration seen on limited exam Musculoskeletal: grossly normal tone  BUE/BLE Psychiatric: grossly normal mood and affect, speech fluent and appropriate Neurologic: grossly non-focal.          Labs on Admission:  Basic Metabolic Panel:  Recent Labs Lab 05/11/14 0734 05/11/14 0814  NA 142 140  K 4.8 4.5  CL 102 103  CO2 31  --   GLUCOSE 86 87  BUN 34* 34*  CREATININE 1.29* 1.40*  CALCIUM 9.9  --    Liver Function Tests:  Recent Labs Lab 05/11/14 0734  AST 15  ALT 10  ALKPHOS 49  BILITOT <0.2*  PROT 7.0  ALBUMIN 3.7    Recent Labs Lab 05/11/14 0847  LIPASE 67*   No results found for this basename: AMMONIA,  in the last 168 hours CBC:  Recent Labs Lab 05/11/14 0734 05/11/14 0814  WBC 5.6  --   HGB 12.4 12.9  HCT 36.9 38.0  MCV 89.1  --   PLT 263  --    Cardiac Enzymes:  Recent Labs Lab 05/11/14 0734  TROPONINI <0.30    BNP (last 3 results) No results found for this basename: PROBNP,  in the last 8760 hours CBG: No results found for this basename: GLUCAP,  in the last 168 hours  Radiological Exams on Admission: Dg Chest 2 View  05/11/2014   CLINICAL DATA:  Chest pain and shortness of breath for 1 day. Initial encounter.  EXAM: CHEST  2 VIEW  COMPARISON:  05/22/2013; 10/30/2012  FINDINGS: Grossly unchanged cardiac silhouette and mediastinal contours. Evaluation of the retrosternal clear space obscured secondary to overlying soft tissues. Bibasilar heterogeneous opacities are unchanged. No new focal airspace opacities. No pleural effusion or pneumothorax. No evidence of edema. Unchanged bones.  IMPRESSION: Grossly unchanged bibasilar atelectasis/scar without acute cardiopulmonary disease.   Electronically Signed   By: Sandi Mariscal M.D.   On: 05/11/2014 08:08    EKG: Independently reviewed.SR non specific T Wave abnormality  Assessment/Plan Chest pain: - 73 year old female with a heart score of 4, comes in for chest pain, I think is reasonable to bring the patient in under observation cycle cardiac enzymes Place n.p.o.  After midnight for possible stress test. She is on Prilosec at home. We'll change this to Protonix. - Her pain is not reproducible by palpation, she has no signs of pneumonia no fever no white blood cell count elevation. EKG does not show signs of pericarditis, chest x-ray does not show widening mediastinum.  HTN (hypertension): - Cont clonidine.   Code Status: full DVT Prophylaxis:heparin Family Communication: none Disposition Plan: inpatient  Time spent: 70 minutes  Charlynne Cousins [facility] Pager [phone]

## 2014-05-11 NOTE — Progress Notes (Signed)
PHARMACIST - PHYSICIAN ORDER COMMUNICATION  CONCERNING: P&T Medication Policy on Herbal Medications  DESCRIPTION:  This patient's order for:  melatonin has been noted.  This product(s) is classified as an "herbal" or natural product. Due to a lack of definitive safety studies or FDA approval, nonstandard manufacturing practices, plus the potential risk of unknown drug-drug interactions while on inpatient medications, the Pharmacy and Therapeutics Committee does not permit the use of "herbal" or natural products of this type within [facility].   ACTION TAKEN: The pharmacy department is unable to verify this order at this time and your patient has been informed of this safety policy. Please reevaluate patient's clinical condition at discharge and address if the herbal or natural product(s) should be resumed at that time.  Alexsis Kathman E. Topher Buen[place]entura, Pharm.D Clinical Pharmacy Resident Pager: [phone] 05/11/2014 2:14 PM

## 2014-05-11 NOTE — ED Provider Notes (Signed)
Medical screening examination/treatment/procedure(s) were conducted as a shared visit with non-physician practitioner(s) and myself.  I personally evaluated the patient during the encounter.   EKG Interpretation   Date/Time:  Sunday May 11 2014 07:06:01 EDT Ventricular Rate:  62 PR Interval:  206 QRS Duration: 101 QT Interval:  471 QTC Calculation: 478 R Axis:   0 Text Interpretation:  Sinus rhythm Inferior infarct, old Probable  anterolateral infarct, old nonspecific t wave changes are more pronounced  compared to prior Confirmed by Dedric Ethington  MD, TREY ([id number]) on 05/11/2014  7:26:56 AM      73  yo female with hx of HTN presenting with substernal and epigastric pressure.  Associated with SOB.  Nitro helped vis EMS.  Aspirin given by EMS.  On exam, well appearing, nontoxic, not distressed, normal respiratory effort, normal perfusion, epigastrium mildly tender to palpation, lungs CTAB, heart sounds normal with RRR.    Admitted.  Clinical Impression: 1. Chest pain, unspecified chest pain type   2. Elevated lipase       Artis Delay, MD 05/11/14 [id number]

## 2014-05-12 ENCOUNTER — Other Ambulatory Visit (HOSPITAL_COMMUNITY): Payer: Medicare Other

## 2014-05-12 ENCOUNTER — Observation Stay (HOSPITAL_COMMUNITY): Payer: Medicare Other

## 2014-05-12 DIAGNOSIS — I517 Cardiomegaly: Secondary | ICD-10-CM

## 2014-05-12 DIAGNOSIS — R079 Chest pain, unspecified: Secondary | ICD-10-CM

## 2014-05-12 DIAGNOSIS — R011 Cardiac murmur, unspecified: Secondary | ICD-10-CM

## 2014-05-12 LAB — HEPATIC FUNCTION PANEL
ALT: 8 U/L (ref 0–35)
AST: 15 U/L (ref 0–37)
Albumin: 3.5 g/dL (ref 3.5–5.2)
Alkaline Phosphatase: 50 U/L (ref 39–117)
BILIRUBIN TOTAL: 0.2 mg/dL — AB (ref 0.3–1.2)
Bilirubin, Direct: <0.2 mg/dL (ref 0.0–0.3)
Total Protein: 6.7 g/dL (ref 6.0–8.3)

## 2014-05-12 LAB — LIPASE, BLOOD: LIPASE: 29 U/L (ref 11–59)

## 2014-05-12 MED ORDER — CIPROFLOXACIN HCL 250 MG PO TABS
250.0000 mg | ORAL_TABLET | Freq: Two times a day (BID) | ORAL | Status: DC
  Administered 2014-05-12 – 2014-05-14 (×5): 250 mg via ORAL
  Filled 2014-05-12 (×8): qty 1

## 2014-05-12 MED ORDER — REGADENOSON 0.4 MG/5ML IV SOLN
INTRAVENOUS | Status: AC
  Filled 2014-05-12: qty 5

## 2014-05-12 MED ORDER — TECHNETIUM TC 99M SESTAMIBI GENERIC - CARDIOLITE
30.0000 | Freq: Once | INTRAVENOUS | Status: AC | PRN
  Administered 2014-05-12: 30 via INTRAVENOUS

## 2014-05-12 MED ORDER — TECHNETIUM TC 99M SESTAMIBI - CARDIOLITE
10.0000 | Freq: Once | INTRAVENOUS | Status: AC | PRN
  Administered 2014-05-12: 11:00:00 10 via INTRAVENOUS

## 2014-05-12 MED ORDER — REGADENOSON 0.4 MG/5ML IV SOLN
0.4000 mg | Freq: Once | INTRAVENOUS | Status: AC
  Administered 2014-05-12: 0.4 mg via INTRAVENOUS
  Filled 2014-05-12: qty 5

## 2014-05-12 MED ORDER — SUCRALFATE 1 GM/10ML PO SUSP
1.0000 g | Freq: Three times a day (TID) | ORAL | Status: DC
  Administered 2014-05-12 – 2014-05-14 (×9): 1 g via ORAL
  Filled 2014-05-12 (×11): qty 10

## 2014-05-12 MED ORDER — PANTOPRAZOLE SODIUM 40 MG PO TBEC
40.0000 mg | DELAYED_RELEASE_TABLET | Freq: Two times a day (BID) | ORAL | Status: DC
  Administered 2014-05-12 – 2014-05-14 (×5): 40 mg via ORAL
  Filled 2014-05-12 (×5): qty 1

## 2014-05-12 NOTE — Consult Note (Signed)
[facility] Consultation Note  Referring Provider:  Dr. Niel Hummer Primary Care Physician:  Mathews Argyle, MD  Reason for Consultation:  Abdominal pain  HPI: Wendy Erickson is a 73 y.o. female admitted for low chest versus epigastric pain.  Prior history of heart disease and inferior MI, has seen cardiology, negative enzymes and negative Myoview this afternoon.  Patient tells me she has had this pain many times, most recently, necessitating her current admission, she had pain wake her up abruptly from sleep.  It is ill-defined in location, feels like a "belt squeezing my stomach," pointing to epigastric and right and left upper quadrants.  Pain sometimes worse after eating, sometimes not; sometimes worse with movement, sometimes not.  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  Has history of nausea and vomiting, seemingly improved once she started course of omeprazole.  No acid brash, regurgitation, retrosternal burning.  Has history of chronic constipation, near-daily bowel movements but need for straining; pain sometimes improves after effective defecation.  No blood in stool.  Had endoscopy many many years ago, showed ulcers per patient.  States she had colonoscopy 20 years ago for screening, thinks it was normal.  No unintentional weight loss.   Past Medical History  Diagnosis Date  . Hypertension   . Chronic kidney disease     left renla neoplasm  . Ataxia   . Depression   . Cancer     left renal neoplasm  . 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 Osteopenia   . Depression   . Shingles     Right V1 distribution  . Foot fracture, right   . 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 Peripheral neuropathy   . Gait disorder     Past Surgical History  Procedure Laterality Date  . Tonsillectomy    . Abdominal hysterectomy    . Knee arthroscopy      bilateral   . Dilation and curettage of  uterus    . Back surgery    . Laparoscopic nephrectomy  10/06/2011    Procedure: LAPAROSCOPIC NEPHRECTOMY;  Surgeon: Dutch Gray, MD;  Location: [facility];  Service: Urology;  Laterality: Left;  LEFT LAPAROSCOPIC RADICAL NEPHRECTOMY      Prior to Admission medications   Medication Sig Start Date End Date Taking? Authorizing Provider  acetaminophen (TYLENOL) 650 MG CR tablet Take 1,300 mg by mouth every 8 (eight) hours as needed for pain.    Yes Historical Provider, MD  aspirin 81 MG tablet Take 81 mg by mouth daily.   Yes Historical Provider, MD  atorvastatin (LIPITOR) 20 MG tablet Take 20 mg by mouth daily. 10/28/13  Yes Historical Provider, MD  Calcium Carbonate-Vitamin D (CALCARB 600/D) 600-400 MG-UNIT per tablet Take 1 tablet by mouth daily.   Yes Historical Provider, MD  carbamazepine (TEGRETOL) 100 MG chewable tablet Chew 1 tablet (100 mg total) by mouth 2 (two) times daily. 11/13/13  Yes Kathrynn Ducking, MD  chlorpheniramine (ALLERGY) 4 MG tablet Take 4 mg by mouth 2 (two) times daily as needed for allergies.   Yes Historical Provider, MD  cholecalciferol (VITAMIN D) 1000 UNITS tablet Take 2,000 Units by mouth every morning.   Yes Historical Provider, MD  cloNIDine (CATAPRES) 0.2 MG tablet Take 0.2 mg by mouth daily.    Yes Historical Provider, MD  ferrous sulfate 325 (65 FE) MG EC tablet Take 325 mg by mouth daily with breakfast.   Yes Historical  Provider, MD  fluocinonide (LIDEX) 0.05 % external solution Apply 1 application topically daily. 04/10/14  Yes Historical Provider, MD  FLUoxetine (PROZAC) 20 MG capsule Take 20 mg by mouth daily.   Yes Historical Provider, MD  gabapentin (NEURONTIN) 300 MG capsule Take 300 mg by mouth 2 (two) times daily.    Yes Historical Provider, MD  Melatonin 3 MG TABS Take 3 mg by mouth at bedtime as needed (for sleep).    Yes Historical Provider, MD  omeprazole (PRILOSEC) 20 MG capsule Take 20 mg by mouth daily.    Yes Historical Provider, MD  polycarbophil  (FIBERCON) 625 MG tablet Take 625 mg by mouth daily.   Yes Historical Provider, MD  senna (SENOKOT) 8.6 MG tablet Take 1 tablet by mouth 2 (two) times daily.   Yes Historical Provider, MD  traMADol-acetaminophen (ULTRACET) 37.5-325 MG per tablet Take 1 tablet by mouth every 6 (six) hours as needed for moderate pain.  02/18/13  Yes Historical Provider, MD  zolpidem (AMBIEN) 5 MG tablet Take 5 mg by mouth at bedtime as needed for sleep.  10/28/13  Yes Historical Provider, MD    Current Facility-Administered Medications  Medication Dose Route Frequency Provider Last Rate Last Dose  . acetaminophen (TYLENOL) tablet 650 mg  650 mg Oral Q4H PRN Charlynne Cousins, MD      . aspirin EC tablet 325 mg  325 mg Oral Daily Charlynne Cousins, MD   325 mg at 05/12/14 1001  . atorvastatin (LIPITOR) tablet 20 mg  20 mg Oral Daily Charlynne Cousins, MD   20 mg at 05/12/14 1002  . carbamazepine (TEGRETOL) chewable tablet 100 mg  100 mg Oral BID Charlynne Cousins, MD   100 mg at 05/12/14 1001  . ciprofloxacin (CIPRO) tablet 250 mg  250 mg Oral BID Belkys A Regalado, MD   250 mg at 05/12/14 1327  . cloNIDine (CATAPRES) tablet 0.2 mg  0.2 mg Oral Daily Charlynne Cousins, MD   0.2 mg at 05/12/14 1002  . FLUoxetine (PROZAC) capsule 20 mg  20 mg Oral Daily Charlynne Cousins, MD   20 mg at 05/12/14 1002  . gabapentin (NEURONTIN) capsule 300 mg  300 mg Oral BID Charlynne Cousins, MD   300 mg at 05/12/14 1001  . gi cocktail (Maalox,Lidocaine,Donnatal)  30 mL Oral QID PRN Charlynne Cousins, MD   30 mL at 05/11/14 2040  . heparin injection 5,000 Units  5,000 Units Subcutaneous 3 times per day Charlynne Cousins, MD   5,000 Units at 05/12/14 1327  . ondansetron (ZOFRAN) injection 4 mg  4 mg Intravenous Q6H PRN Charlynne Cousins, MD      . pantoprazole (PROTONIX) EC tablet 40 mg  40 mg Oral Daily Charlynne Cousins, MD   40 mg at 05/12/14 1001  . polycarbophil (FIBERCON) tablet 625 mg  625 mg Oral Daily  Charlynne Cousins, MD   625 mg at 05/12/14 1001  . regadenoson (LEXISCAN) 0.4 MG/5ML injection SOLN           . senna (SENOKOT) tablet 8.6 mg  1 tablet Oral BID Charlynne Cousins, MD   8.6 mg at 05/12/14 1001  . traMADol-acetaminophen (ULTRACET) 37.5-325 MG per tablet 1 tablet  1 tablet Oral Q6H PRN Charlynne Cousins, MD   1 tablet at 05/11/14 2203  . zolpidem (AMBIEN) tablet 5 mg  5 mg Oral QHS PRN Charlynne Cousins, MD   5 mg at 05/11/14 2203  Allergies as of 05/11/2014  . (No Known Allergies)    Family History  Problem Relation Age of Onset  . Ataxia Father   . Dementia Mother     History   Social History  . Marital Status: Single    Spouse Name: N/A    Number of Children: 1  . Years of Education: college 2   Occupational History  . retired    Social History Main Topics  . Smoking status: Former Smoker    Quit date: 08/08/2005  . Smokeless tobacco: Never Used     Comment: quit 1999  . Alcohol Use: Yes     Comment: wine on Friday  . Drug Use: No  . Sexual Activity: Not on file   Other Topics Concern  . Not on file   Social History Narrative  . No narrative on file    Review of Systems:  ROS Dr. Olevia Bowens 05/11/14 reviewed and I agree  Physical Exam: Vital signs in last 24 hours: Temp:  [97.8 F (36.6 C)-98.9 F (37.2 C)] 97.8 F (36.6 C) (10/05 1347) Pulse Rate:  [71-83] 71 (10/05 1347) Resp:  [16-28] 18 (10/05 1347) BP: (100-167)/(36-82) 115/53 mmHg (10/05 1347) SpO2:  [93 %-96 %] 95 % (10/05 1347) Weight:  [73.2 kg (161 lb 6 oz)] 73.2 kg (161 lb 6 oz) (10/05 0400) Last BM Date: 05/10/14 General:   Alert, essentially bedbound/wheelchair bound due to her neurologic condition  Well-developed, well-nourished, pleasant and cooperative in NAD Head:  Normocephalic and atraumatic. Eyes:  Sclera clear, no icterus.   Conjunctiva pink. Ears:  Normal auditory acuity. Nose:  No deformity, discharge,  or lesions. Mouth:  Poor dentition; No deformity or  lesions.  Oropharynx pink & moist. Neck:  Supple; no masses or thyromegaly. Lungs:  Clear throughout to auscultation.   No wheezes, crackles, or rhonchi. No acute distress. Heart:  Regular rate and rhythm; 2/6 SEM, no other murmurs, clicks, rubs,  or gallops. Abdomen:  Soft, non-distended, mild generalized upper abdominal tenderness without peritonitis, No masses, hepatosplenomegaly or hernias noted. Normal bowel sounds, without guarding, and without rebound.     Msk:  Symmetrical without gross deformities. Normal posture. Pulses:  Normal pulses noted. Extremities:  Without clubbing or edema. Neurologic:  Alert and  oriented x4; ataxia with expressive aphasias, awkward affect at times, diffusely weak. Skin:  Intact without significant lesions or rashes. Psych:  Alert and cooperative. Depressed mood, awkward affect   Lab Results:  Recent Labs  05/11/14 0734 05/11/14 0814 05/11/14 1545  WBC 5.6  --  9.6  HGB 12.4 12.9 12.9  HCT 36.9 38.0 38.3  PLT 263  --  253   BMET  Recent Labs  05/11/14 0734 05/11/14 0814 05/11/14 1545  NA 142 140  --   K 4.8 4.5  --   CL 102 103  --   CO2 31  --   --   GLUCOSE 86 87  --   BUN 34* 34*  --   CREATININE 1.29* 1.40* 1.28*  CALCIUM 9.9  --   --    LFT  Recent Labs  05/11/14 0734  PROT 7.0  ALBUMIN 3.7  AST 15  ALT 10  ALKPHOS 49  BILITOT <0.2*   PT/INR No results found for this basename: LABPROT, INR,  in the last 72 hours  Studies/Results: Dg Chest 2 View  05/11/2014   CLINICAL DATA:  Chest pain and shortness of breath for 1 day. Initial encounter.  EXAM: CHEST  2  VIEW  COMPARISON:  05/22/2013; 10/30/2012  FINDINGS: Grossly unchanged cardiac silhouette and mediastinal contours. Evaluation of the retrosternal clear space obscured secondary to overlying soft tissues. Bibasilar heterogeneous opacities are unchanged. No new focal airspace opacities. No pleural effusion or pneumothorax. No evidence of edema. Unchanged bones.   IMPRESSION: Grossly unchanged bibasilar atelectasis/scar without acute cardiopulmonary disease.   Electronically Signed   By: Sandi Mariscal M.D.   On: 05/11/2014 08:08   Nm Myocar Multi W/spect W/wall Motion / Ef  05/12/2014   CLINICAL DATA:  Patient is a 73 yo wwith history of CP Test to evaluate, rule out ischemia  EXAM: MYOCARDIAL IMAGING WITH SPECT (REST AND PHARMACOLOGIC-STRESS)  GATED LEFT VENTRICULAR WALL MOTION STUDY  LEFT VENTRICULAR EJECTION FRACTION  TECHNIQUE: Standard myocardial SPECT imaging was performed after resting intravenous injection of 10 mCi Tc-20m sestamibi. Subsequently, intravenous infusion of Lexiscan was performed under the supervision of the Cardiology staff. At peak effect of the drug, 30 mCi Tc-90m sestamibi was injected intravenously and standard myocardial SPECT imaging was performed. Quantitative gated imaging was also performed to evaluate left ventricular wall motion, and estimate left ventricular ejection fraction.  COMPARISON:  None.  FINDINGS: Stress Data: Baseline EKG SR 69 bpm Nonspecific ST T wave changes. WIth infusion of Lexiscan there were no significant changes beyond baseline to suggest ischemia.  Nuclear data: In the initial stress images there was minimal thinning in the mid anteroseptal wall. Otherwise normal perfusion. On the recovery images there was no significant change.  ON gating LVEF was calculated at 81 % with normal wall motion. Review of the raw data, there is some soft tissue (breast) overlying the anterior walll  Impression: Lexiscan myoview: Electrically negative for ischemia. Myoview scan with probable normal perfusion and minimal soft tissue attenuation (breast) LVEF calculated at 81% Overall low risk scan.  *2012 Appropriate Use Criteria for Coronary Revascularization Focused Update: J Am Coll Cardiol. 9323;55(7):322-025. http://content.airportbarriers.com.aspx?articleid=1201161   Electronically Signed   By: Dorris Carnes M.D.   On: 05/12/2014 14:46    US Abdomen Limited  05/12/2014   CLINICAL DATA:  Epigastric pain.  EXAM: US ABDOMEN LIMITED - RIGHT UPPER QUADRANT  COMPARISON:  CT 08/30/2013  FINDINGS: Gallbladder:  There is a large mobile shadowing echogenic gallstone within the gallbladder lumen. No gallbladder wall thickening. No pericholecystic fluid. Equivocal sonographic Murphy sign.  Common bile duct:  Diameter: 5 mm  Liver:  Normal hepatic parenchymal echogenicity. There is a 7 mm hypodense lesion within the left lobe of the liver, too small accurately characterize. Multiple low-attenuation lesions were demonstrated on prior CT.  IMPRESSION: Cholelithiasis without sonographic evidence for acute cholecystitis.   Electronically Signed   By: Lovey Newcomer M.D.   On: 05/12/2014 13:34    Impression:  1.  Abdominal pain.  Atypical for any specific consideration.  Differential possibilities include gallbladder disease, non-ulcer dyspepsia, GERD, peptic ulcer, pancreatitis (doubt significance of isolated mildly elevated lipase level), even constipation.  Negative cardiac testing makes cardiac entities seem less likely. 2.  Hyperlipasemia.  Very mild.  Doubtful significance.  Normal LFTs makes biliary pancreatitis very unlikely. 3.  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  Plan:  1.  Intensify antipeptic/antisecretory regimen; increase pantoprazole to 40 mg po bid and add sucralfate suspension 1 gram po tid. 2.  HIDA scan tomorrow. 3.  Recheck lipase and LFTs. 4.  If no improvement with #1 above, and if HIDA is unrevealing, consider  endoscopy as next step in management.   LOS: 1 day   Korban Shearer M  05/12/2014, 3:46 PM

## 2014-05-12 NOTE — Progress Notes (Signed)
[facility] PROGRESS NOTE  Juliah Scadden IRS:[id number] DOB: [birth year] DOA: 05/11/2014 PCP: Mathews Argyle, MD  Assessment/Plan: 1-Chest pain; atypical feature. Myoview pending. Repeat lipase. Check Abdominal; [place].  Very mild increase lipase at 67.   2-UTI; start cipro. Follow culture.   3-HTN; clonidine.   Code Status: Full code.  Family Communication: care discussed with patient Disposition Plan: to be dtermine   Consultants:  Cardiology  Procedures:  Myoview  ECHO  US abdomen.   Antibiotics:  Ciprofloxacin 10-05   HPI/Subjective: Relates chest pain worse with meals at time.   Objective: Filed Vitals:   05/12/14 1207  BP: 110/45  Pulse: 77  Temp:   Resp: 20    Intake/Output Summary (Last 24 hours) at 05/12/14 1231 Last data filed at 05/12/14 1011  Gross per 24 hour  Intake      0 ml  Output    475 ml  Net   -475 ml   Filed Weights   05/12/14 0400  Weight: 73.2 kg (161 lb 6 oz)    Exam:   General:  Alert in no distress.   Cardiovascular: S 1, S 2 RRR  Respiratory: CTA  Abdomen: Bs present, soft, NT  Musculoskeletal: no edema.   Data Reviewed: Basic Metabolic Panel:  Recent Labs Lab 05/11/14 0734 05/11/14 0814 05/11/14 1545  NA 142 140  --   K 4.8 4.5  --   CL 102 103  --   CO2 31  --   --   GLUCOSE 86 87  --   BUN 34* 34*  --   CREATININE 1.29* 1.40* 1.28*  CALCIUM 9.9  --   --    Liver Function Tests:  Recent Labs Lab 05/11/14 0734  AST 15  ALT 10  ALKPHOS 49  BILITOT <0.2*  PROT 7.0  ALBUMIN 3.7    Recent Labs Lab 05/11/14 0847  LIPASE 67*   No results found for this basename: AMMONIA,  in the last 168 hours CBC:  Recent Labs Lab 05/11/14 0734 05/11/14 0814 05/11/14 1545  WBC 5.6  --  9.6  HGB 12.4 12.9 12.9  HCT 36.9 38.0 38.3  MCV 89.1  --  89.5  PLT 263  --  253   Cardiac Enzymes:  Recent Labs Lab 05/11/14 0734 05/11/14 1545 05/11/14 1710 05/11/14 1952  TROPONINI <0.30  <0.30 <0.30 <0.30   BNP (last 3 results) No results found for this basename: PROBNP,  in the last 8760 hours CBG: No results found for this basename: GLUCAP,  in the last 168 hours  No results found for this or any previous visit (from the past 240 hour(s)).   Studies: Dg Chest 2 View  05/11/2014   CLINICAL DATA:  Chest pain and shortness of breath for 1 day. Initial encounter.  EXAM: CHEST  2 VIEW  COMPARISON:  05/22/2013; 10/30/2012  FINDINGS: Grossly unchanged cardiac silhouette and mediastinal contours. Evaluation of the retrosternal clear space obscured secondary to overlying soft tissues. Bibasilar heterogeneous opacities are unchanged. No new focal airspace opacities. No pleural effusion or pneumothorax. No evidence of edema. Unchanged bones.  IMPRESSION: Grossly unchanged bibasilar atelectasis/scar without acute cardiopulmonary disease.   Electronically Signed   By: Sandi Mariscal M.D.   On: 05/11/2014 08:08    Scheduled Meds: . aspirin EC  325 mg Oral Daily  . atorvastatin  20 mg Oral Daily  . carbamazepine  100 mg Oral BID  . ciprofloxacin  250 mg Oral BID  . cloNIDine  0.2 mg  Oral Daily  . FLUoxetine  20 mg Oral Daily  . gabapentin  300 mg Oral BID  . heparin  5,000 Units Subcutaneous 3 times per day  . pantoprazole  40 mg Oral Daily  . polycarbophil  625 mg Oral Daily  . regadenoson      . senna  1 tablet Oral BID   Continuous Infusions:   Active Problems:   HTN (hypertension)   Chest pain   Atypical chest pain   Systolic murmur    Time spent: 25 minutes.     Niel Hummer A  [facility] Pager [phone]. If 7PM-7AM, please contact night-coverage at [email or web address], password [facility] 05/12/2014, 12:31 PM  LOS: 1 day

## 2014-05-12 NOTE — Progress Notes (Signed)
Dorothy Spark 05/12/2014

## 2014-05-12 NOTE — Progress Notes (Signed)
UR completed 

## 2014-05-12 NOTE — Progress Notes (Signed)
Lexiscan Myoview done. Pt tolerated well. Images pending.  Kerin Ransom PA-C 05/12/2014 12:11 PM

## 2014-05-12 NOTE — Progress Notes (Signed)
Myoview negative for ischemia.  Kerin Ransom PA-C 05/12/2014 3:37 PM

## 2014-05-12 NOTE — Consult Note (Addendum)
CARDIOLOGY CONSULT NOTE   Patient ID: Wendy Erickson MRN: [id number], DOB/AGE: [birth year]   Admit date: 05/11/2014 Date of Consult: 05/12/2014  Primary Physician: Mathews Argyle, MD Primary Cardiologist: Dorothy Spark  Reason for consult:  Chest pain  Problem List  Past Medical History  Diagnosis Date  . Hypertension   . Chronic kidney disease     left renla neoplasm  . Ataxia   . Depression   . Cancer     left renal neoplasm  . 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 Osteopenia   . Depression   . Shingles     Right V1 distribution  . Foot fracture, right   . 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 Peripheral neuropathy   . Gait disorder     Past Surgical History  Procedure Laterality Date  . Tonsillectomy    . Abdominal hysterectomy    . Knee arthroscopy      bilateral   . Dilation and curettage of uterus    . Back surgery    . Laparoscopic nephrectomy  10/06/2011    Procedure: LAPAROSCOPIC NEPHRECTOMY;  Surgeon: Dutch Gray, MD;  Location: [facility];  Service: Urology;  Laterality: Left;  LEFT LAPAROSCOPIC RADICAL NEPHRECTOMY      Allergies  No Known Allergies  HPI   73 year old female past medical history renal cell carcinoma status post left nephrectomy, hypertension and depression, Tobacco he used And quit about 7 years ago That comes into the ER complaining of chest pain Over the last 24 hours that woke her up from her sleep. The pain felt like pressure, was located in the epigastric region, no obvious alleviating or exacerbating factors. No associated symptoms such as SOB or nausea, no radiation. She has been in a wheelchair for the last 18 months due to ataxia. She sleeps and spends most of her day in a recliner. Exertional portion of her pain is therefore difficult to assess. The pain was relieved by NTG given in the ER. She has had similar chest pains in the past. She has no cardiac history.     Inpatient Medications  . aspirin EC   325 mg Oral Daily  . atorvastatin  20 mg Oral Daily  . carbamazepine  100 mg Oral BID  . cloNIDine  0.2 mg Oral Daily  . FLUoxetine  20 mg Oral Daily  . gabapentin  300 mg Oral BID  . heparin  5,000 Units Subcutaneous 3 times per day  . pantoprazole  40 mg Oral Daily  . polycarbophil  625 mg Oral Daily  . senna  1 tablet Oral BID   Family History Family History  Problem Relation Age of Onset  . Ataxia Father   . Dementia Mother     Social History History   Social History  . Marital Status: Single    Spouse Name: N/A    Number of Children: 1  . Years of Education: college 2   Occupational History  . retired    Social History Main Topics  . Smoking status: Former Smoker    Quit date: 08/08/2005  . Smokeless tobacco: Never Used     Comment: quit 1999  . Alcohol Use: Yes     Comment: wine on Friday  . Drug Use: No  . Sexual Activity: Not on file   Other Topics Concern  . Not on file   Social History Narrative  . No narrative on file    Review of Systems  General:  No chills, fever, night sweats or weight changes.  Cardiovascular:  No chest pain, dyspnea on exertion, edema, orthopnea, palpitations, paroxysmal nocturnal dyspnea. Dermatological: No rash, lesions/masses Respiratory: No cough, dyspnea Urologic: No hematuria, dysuria Abdominal:   No nausea, vomiting, diarrhea, bright red blood per rectum, melena, or hematemesis Neurologic:  No visual changes, wkns, changes in mental status. All other systems reviewed and are otherwise negative except as noted above.  Physical Exam  Blood pressure 139/67, pulse 72, temperature 98.7 F (37.1 C), temperature source Oral, resp. rate 18, weight 161 lb 6 oz (73.2 kg), SpO2 93.00%.  General: Pleasant, NAD Psych: Normal affect. Neuro: Alert and oriented X 3. Moves all extremities spontaneously. HEENT: Normal  Neck: Supple without bruits or JVD. Lungs:  Resp regular and unlabored, CTA. Heart: RRR no s3, s4, 3/6 short  systolic murmur Abdomen: Soft, non-tender, non-distended, BS + x 4.  Extremities: No clubbing, cyanosis or edema. DP/PT/Radials 2+ and equal bilaterally.  Labs  Recent Labs  05/11/14 0734 05/11/14 1545 05/11/14 1710 05/11/14 1952  TROPONINI <0.30 <0.30 <0.30 <0.30   Lab Results  Component Value Date   WBC 9.6 05/11/2014   HGB 12.9 05/11/2014   HCT 38.3 05/11/2014   MCV 89.5 05/11/2014   PLT 253 05/11/2014    Recent Labs Lab 05/11/14 0734 05/11/14 0814 05/11/14 1545  NA 142 140  --   K 4.8 4.5  --   CL 102 103  --   CO2 31  --   --   BUN 34* 34*  --   CREATININE 1.29* 1.40* 1.28*  CALCIUM 9.9  --   --   PROT 7.0  --   --   BILITOT <0.2*  --   --   ALKPHOS 49  --   --   ALT 10  --   --   AST 15  --   --   GLUCOSE 86 87  --    Radiology/Studies  Dg Chest 2 View  05/11/2014   CLINICAL DATA:  Chest pain and shortness of breath for 1 day. Initial encounter.  EXAM: CHEST  2 VIEW  COMPARISON:  05/22/2013; 10/30/2012  FINDINGS: Grossly unchanged cardiac silhouette and mediastinal contours. Evaluation of the retrosternal clear space obscured secondary to overlying soft tissues. Bibasilar heterogeneous opacities are unchanged. No new focal airspace opacities. No pleural effusion or pneumothorax. No evidence of edema. Unchanged bones.  IMPRESSION: Grossly unchanged bibasilar atelectasis/scar without acute cardiopulmonary disease.     Echocardiogram - none  ECG  - SR, inferior MI, age undetermined, unchanged from 04/12/2014   ASSESSMENT AND PLAN  73 year old female  1. Chest pain with some typical and some atypical features, elevated lipase and epigastric location, troponins negative x 3, however prior inferior MI. We will order a nuclear Lexiscan stress test to evaluate for possible ischemia and prior scar. If negative GI work up should be done.  2. HTN - well controlled  3. Acute on chronic kidney failure - improving 1.4 --> 1.28  4. Systolic murmur - we will order echo to  further evaluate    Signed, Dorothy Spark, MD, [facility] 05/12/2014, 9:15 AM

## 2014-05-12 NOTE — Progress Notes (Signed)
Clinical Social Work Department BRIEF PSYCHOSOCIAL ASSESSMENT 05/12/2014  Patient:  Wendy Erickson, Wendy Erickson     Account Number:  [id number]     Admit date:  05/11/2014  Clinical Social Worker:  Adair Laundry  Date/Time:  05/12/2014 12:15 PM  Referred by:  Physician  Date Referred:  05/12/2014 Referred for  ALF Placement   Other Referral:   Interview type:  Family Other interview type:   Spoke with pt son Wendy Erickson over the phone    PSYCHOSOCIAL DATA Living Status:  FACILITY Admitted from facility:  [facility] Level of care:  Assisted Living Primary support name:  Wendy Erickson Primary support relationship to patient:  CHILD, ADULT Degree of support available:   Pt has supportive family    CURRENT CONCERNS Current Concerns  Post-Acute Placement   Other Concerns:    SOCIAL WORK ASSESSMENT / PLAN CSW notified that pt was admitted from facility. CSW attempted to speak with pt but pt off the floor for procedure. CSW called pt son listed on facesheet. CSW introduced self and explained role. Pt son confirmed that pt [facility] and has been at facility for almost 4 years. Pt son expressed only one concern and that was that he was not notified until today that pt was admitted to the hospital. After talking further, pt son informed CSW that pt had asked facility not to notify family. CSW offered support to pt son and explained that if pt is not confused then facility cannot go against pt wishes. Pt son stated that likely pt just did not want to worry him for nothing. CSW agreed to speak with pt and inquire as to why pt did not want facility to notify son last night. CSW awaiting return call from facility to update on pt status and potential for dc. Will also inquire if pt needs new FL2 if dc today since pt is Observation.   Assessment/plan status:  Psychosocial Support/Ongoing Assessment of Needs Other assessment/ plan:   Information/referral to community  resources:   None needed    PATIENT'S/FAMILY'S RESPONSE TO PLAN OF CARE: Pt son agreeable to pt returning to [place], [place]

## 2014-05-12 NOTE — Progress Notes (Signed)
Wendy Erickson 05/12/2014

## 2014-05-12 NOTE — Progress Notes (Signed)
  Echocardiogram 2D Echocardiogram has been performed.  Diamond Nickel 05/12/2014, 3:38 PM

## 2014-05-12 NOTE — Progress Notes (Signed)
CSW (Clinical Education officer, museum) received call from [facility]	[place]. They informed CSW that pt is from the Independent living facility. They are agreeable to pt returning if medically appropriate. They did inform CSW that PT evaluation would be beneficial as pt has had recent fall at facility. Will await PT evaluation but pt may need home health services at facility.  [place], [place]

## 2014-05-12 NOTE — Progress Notes (Signed)
RN notified by nuclear med that pt received contrast today during myoview, pt unable to received contrast tomorrow for hida scan. Dr. Paulita Fujita notified and he stated "hida scan can be performed Wednesday, instead of Tuesday."

## 2014-05-12 NOTE — Evaluation (Signed)
Physical Therapy Evaluation Patient Details Name: Wendy Erickson MRN: [id number] DOB: 1941-05-18 Today's Date: 05/12/2014   History of Present Illness  pt presents with CP and UTI.    Clinical Impression  Pt generally unsteady and very labored with all mobility.  Pt admits that transfers are difficult at times even prior to this illness.  Spoke with pt about SNF level of care to maximize independence prior to returning to independent living, however pt  Stating "once you go into those places you never get out" and relating story of her father being bedridden for 46yrs.  At this time pt is not safe for return to [place] living without additional A.  May need to consider HHPT along with Nsg checking on pt daily or transition to ALF.  Will continue to follow.      Follow Up Recommendations SNF    Equipment Recommendations  None recommended by PT    Recommendations for Other Services       Precautions / Restrictions Precautions Precautions: Fall Restrictions Weight Bearing Restrictions: No      Mobility  Bed Mobility Overal bed mobility: Needs Assistance Bed Mobility: Supine to Sit;Sit to Supine     Supine to sit: Supervision;HOB elevated Sit to supine: Supervision;HOB elevated   General bed mobility comments: pt utilizes bed rails and head of bed elevated.  Needs increased time.    Transfers Overall transfer level: Needs assistance Equipment used: None Transfers: Sit to/from [facility] Sit to Stand: Min assist;Min guard Stand pivot transfers: Min guard       General transfer comment: pt initially needed MinA to come to stand from bed, but from 3-in-1 was able to come to stand and pivot back to bed with MinG.  pt unsteady and has uncontrolled descent to sitting on both 3-in-1 and bed.  pt has trouble completing pivot with SPT and starts to sit prematurely only landing 1/2 - 2/3 of bottom on the sitting surface.    Ambulation/Gait                 Stairs            Wheelchair Mobility    Modified Rankin (Stroke Patients Only)       Balance Overall balance assessment: Needs assistance         Standing balance support: Bilateral upper extremity supported;During functional activity Standing balance-Leahy Scale: Poor                               Pertinent Vitals/Pain Pain Assessment: No/denies pain    Home Living Family/patient expects to be discharged to::  ([place])                 Additional Comments: pt has Glass blower/designer and handicap apartment    Prior Function Level of Independence: Needs assistance   Gait / Transfers Assistance Needed: pt nonambulatory.  Uses Jazzy chair and only transfers.    ADL's / Homemaking Assistance Needed: Staff perform all homemaking tasks and pt goes to dining room for meals.  pt performs her own ADLs.          Hand Dominance        Extremity/Trunk Assessment   Upper Extremity Assessment: Generalized weakness (Impaired coordination)           Lower Extremity Assessment: Generalized weakness (Impaired coordination)      Cervical / Trunk Assessment: Kyphotic  Communication  Communication: No difficulties  Cognition Arousal/Alertness: Awake/alert Behavior During Therapy: WFL for tasks assessed/performed Overall Cognitive Status: Within Functional Limits for tasks assessed                      General Comments      Exercises        Assessment/Plan    PT Assessment Patient needs continued PT services  PT Diagnosis Generalized weakness   PT Problem List Decreased strength;Decreased activity tolerance;Decreased balance;Decreased mobility;Decreased coordination;Decreased knowledge of use of DME  PT Treatment Interventions DME instruction;Functional mobility training;Therapeutic activities;Therapeutic exercise;Balance training;Patient/family education   PT Goals (Current goals can be found in the Care  Plan section) Acute Rehab PT Goals Patient Stated Goal: Back to her apartment PT Goal Formulation: With patient Time For Goal Achievement: 05/26/14 Potential to Achieve Goals: Fair    Frequency Min 3X/week   Barriers to discharge        Co-evaluation               End of Session   Activity Tolerance: Patient limited by fatigue Patient left: in bed;with call bell/phone within reach Nurse Communication: Mobility status    Functional Assessment Tool Used: Clinical Judgement Functional Limitation: Mobility: Walking and moving around Mobility: Walking and Moving Around Current Status ([id number]): At least 1 percent but less than 20 percent impaired, limited or restricted Mobility: Walking and Moving Around Goal Status [phone]): 0 percent impaired, limited or restricted    Time: 1419-1445 PT Time Calculation (min): 26 min   Charges:   PT Evaluation $Initial PT Evaluation Tier I: 1 Procedure PT Treatments $Therapeutic Activity: 8-22 mins   PT G Codes:   Functional Assessment Tool Used: Clinical Judgement Functional Limitation: Mobility: Walking and moving around    Catarina Hartshorn, [place] [phone] 05/12/2014, 2:51 PM

## 2014-05-13 ENCOUNTER — Observation Stay (HOSPITAL_COMMUNITY): Payer: Medicare Other

## 2014-05-13 DIAGNOSIS — N179 Acute kidney failure, unspecified: Secondary | ICD-10-CM

## 2014-05-13 DIAGNOSIS — R748 Abnormal levels of other serum enzymes: Secondary | ICD-10-CM

## 2014-05-13 DIAGNOSIS — I10 Essential (primary) hypertension: Secondary | ICD-10-CM

## 2014-05-13 LAB — COMPREHENSIVE METABOLIC PANEL
ALT: 8 U/L (ref 0–35)
AST: 13 U/L (ref 0–37)
Albumin: 3.4 g/dL — ABNORMAL LOW (ref 3.5–5.2)
Alkaline Phosphatase: 51 U/L (ref 39–117)
Anion gap: 11 (ref 5–15)
BUN: 36 mg/dL — ABNORMAL HIGH (ref 6–23)
CALCIUM: 9 mg/dL (ref 8.4–10.5)
CO2: 28 meq/L (ref 19–32)
CREATININE: 1.35 mg/dL — AB (ref 0.50–1.10)
Chloride: 102 mEq/L (ref 96–112)
GFR calc Af Amer: 45 mL/min — ABNORMAL LOW (ref >90)
GFR, EST NON AFRICAN AMERICAN: 38 mL/min — AB (ref >90)
Glucose, Bld: 121 mg/dL — ABNORMAL HIGH (ref 70–99)
Potassium: 4.2 mEq/L (ref 3.7–5.3)
Sodium: 141 mEq/L (ref 137–147)
TOTAL PROTEIN: 6.8 g/dL (ref 6.0–8.3)
Total Bilirubin: <0.2 mg/dL — ABNORMAL LOW (ref 0.3–1.2)

## 2014-05-13 LAB — GLUCOSE, CAPILLARY: GLUCOSE-CAPILLARY: 104 mg/dL — AB (ref 70–99)

## 2014-05-13 LAB — LIPASE, BLOOD: LIPASE: 32 U/L (ref 11–59)

## 2014-05-13 MED ORDER — METOPROLOL TARTRATE 50 MG PO TABS
50.0000 mg | ORAL_TABLET | Freq: Two times a day (BID) | ORAL | Status: DC
Start: 2014-05-13 — End: 2014-05-14
  Administered 2014-05-13 – 2014-05-14 (×3): 50 mg via ORAL
  Filled 2014-05-13 (×4): qty 1

## 2014-05-13 NOTE — Progress Notes (Signed)
Unable to have HIDA today, due to residual tracer activity from her Myoview yesterday.  Will plan then on HIDA tomorrow, 10/7, and further management will be pending HIDA results.

## 2014-05-13 NOTE — Progress Notes (Signed)
[facility] PROGRESS NOTE  Wendy Erickson ONG:[id number] DOB: Jan 02, 1941 DOA: 05/11/2014 PCP: Mathews Argyle, MD  Assessment/Plan: 1-Chest pain; atypical feature. Myoview pending. Repeat lipase. Abdominal US; cholelithiasis.  Very mild increase lipase at 67. Appreciate Dr Paulita Fujita recommendation. HIDA scan tomorrow.  Protonix change to BID, started on Carafate.   2-UTI; Continue with  cipro. Follow culture.   3-HTN; clonidine change to metoprolol.  4-AMS: feeling dizzy. Will check CT head, ABG to check for hypercapnia. Frequent neuro check. CBG 105.   Code Status: Full code.  Family Communication: care discussed with patient Disposition Plan: to be dtermine   Consultants:  Cardiology  Procedures:  Myoview: negative for ischemia.   ECHO: Left ventricle: The cavity size was normal. There was mild concentric hypertrophy. Systolic function was normal. The estimated ejection fraction was in the range of 60% to 65%. There was dynamic mid-cavity obliteration with resting gradient 32 mmHg. Wall motion was normal; there were no regional wall motion abnormalities. Doppler parameters are consistent with abnormal left ventricular relaxation (grade 1 diastolic dysfunction). Doppler parameters are consistent with elevated ventricular end-diastolic filling pressure    US abdomen: cholelithiasis.   Antibiotics:  Ciprofloxacin 10-05   HPI/Subjective: Patient was feeling foggy. He speech appears more slurred ?Marland Kitchen Patient relates that is her baseline.  Patient with chronic ataxia. She was having some blurry vision, after cleaning her glasses, she was able to see better.    Objective: Filed Vitals:   05/13/14 0758  BP: 156/62  Pulse: 84  Temp: 98.4 F (36.9 C)  Resp: 18    Intake/Output Summary (Last 24 hours) at 05/13/14 1240 Last data filed at 05/12/14 2100  Gross per 24 hour  Intake    240 ml  Output    300 ml  Net    -60 ml   Filed Weights   05/12/14 0400  05/13/14 0400  Weight: 73.2 kg (161 lb 6 oz) 73 kg (160 lb 15 oz)    Exam:   General:  Alert in no distress.   Cardiovascular: S 1, S 2 RRR  Respiratory: CTA  Abdomen: Bs present, soft, NT  Musculoskeletal: no edema.   Neuro chronic ataxia, speech mildly slurred, per patient that is her baseline. Non focal.   Data Reviewed: Basic Metabolic Panel:  Recent Labs Lab 05/11/14 0734 05/11/14 0814 05/11/14 1545 05/13/14 0950  NA 142 140  --  141  K 4.8 4.5  --  4.2  CL 102 103  --  102  CO2 31  --   --  28  GLUCOSE 86 87  --  121*  BUN 34* 34*  --  36*  CREATININE 1.29* 1.40* 1.28* 1.35*  CALCIUM 9.9  --   --  9.0   Liver Function Tests:  Recent Labs Lab 05/11/14 0734 05/12/14 1548 05/13/14 0950  AST 15 15 13   ALT 10 8 8   ALKPHOS 49 50 51  BILITOT <0.2* 0.2* <0.2*  PROT 7.0 6.7 6.8  ALBUMIN 3.7 3.5 3.4*    Recent Labs Lab 05/11/14 0847 05/12/14 1548 05/13/14 0950  LIPASE 67* 29 32   No results found for this basename: AMMONIA,  in the last 168 hours CBC:  Recent Labs Lab 05/11/14 0734 05/11/14 0814 05/11/14 1545  WBC 5.6  --  9.6  HGB 12.4 12.9 12.9  HCT 36.9 38.0 38.3  MCV 89.1  --  89.5  PLT 263  --  253   Cardiac Enzymes:  Recent Labs Lab 05/11/14 0734 05/11/14 1545  05/11/14 1710 05/11/14 1952  TROPONINI <0.30 <0.30 <0.30 <0.30   BNP (last 3 results) No results found for this basename: PROBNP,  in the last 8760 hours CBG:  Recent Labs Lab 05/13/14 1208  GLUCAP 104*    No results found for this or any previous visit (from the past 240 hour(s)).   Studies: Nm Myocar Multi W/spect W/wall Motion / Ef  05/12/2014   CLINICAL DATA:  Patient is a 73 yo wwith history of CP Test to evaluate, rule out ischemia  EXAM: MYOCARDIAL IMAGING WITH SPECT (REST AND PHARMACOLOGIC-STRESS)  GATED LEFT VENTRICULAR WALL MOTION STUDY  LEFT VENTRICULAR EJECTION FRACTION  TECHNIQUE: Standard myocardial SPECT imaging was performed after resting  intravenous injection of 10 mCi Tc-24m sestamibi. Subsequently, intravenous infusion of Lexiscan was performed under the supervision of the Cardiology staff. At peak effect of the drug, 30 mCi Tc-63m sestamibi was injected intravenously and standard myocardial SPECT imaging was performed. Quantitative gated imaging was also performed to evaluate left ventricular wall motion, and estimate left ventricular ejection fraction.  COMPARISON:  None.  FINDINGS: Stress Data: Baseline EKG SR 69 bpm Nonspecific ST T wave changes. WIth infusion of Lexiscan there were no significant changes beyond baseline to suggest ischemia.  Nuclear data: In the initial stress images there was minimal thinning in the mid anteroseptal wall. Otherwise normal perfusion. On the recovery images there was no significant change.  ON gating LVEF was calculated at 81 % with normal wall motion. Review of the raw data, there is some soft tissue (breast) overlying the anterior walll  Impression: Lexiscan myoview: Electrically negative for ischemia. Myoview scan with probable normal perfusion and minimal soft tissue attenuation (breast) LVEF calculated at 81% Overall low risk scan.  *2012 Appropriate Use Criteria for Coronary Revascularization Focused Update: J Am Coll Cardiol. 1025;85(2):778-242. http://content.airportbarriers.com.aspx?articleid=1201161   Electronically Signed   By: Dorris Carnes M.D.   On: 05/12/2014 14:46   US Abdomen Limited  05/12/2014   CLINICAL DATA:  Epigastric pain.  EXAM: US ABDOMEN LIMITED - RIGHT UPPER QUADRANT  COMPARISON:  CT 08/30/2013  FINDINGS: Gallbladder:  There is a large mobile shadowing echogenic gallstone within the gallbladder lumen. No gallbladder wall thickening. No pericholecystic fluid. Equivocal sonographic Murphy sign.  Common bile duct:  Diameter: 5 mm  Liver:  Normal hepatic parenchymal echogenicity. There is a 7 mm hypodense lesion within the left lobe of the liver, too small accurately characterize.  Multiple low-attenuation lesions were demonstrated on prior CT.  IMPRESSION: Cholelithiasis without sonographic evidence for acute cholecystitis.   Electronically Signed   By: Lovey Newcomer M.D.   On: 05/12/2014 13:34    Scheduled Meds: . aspirin EC  325 mg Oral Daily  . atorvastatin  20 mg Oral Daily  . carbamazepine  100 mg Oral BID  . ciprofloxacin  250 mg Oral BID  . FLUoxetine  20 mg Oral Daily  . gabapentin  300 mg Oral BID  . heparin  5,000 Units Subcutaneous 3 times per day  . metoprolol tartrate  50 mg Oral BID  . pantoprazole  40 mg Oral BID AC  . polycarbophil  625 mg Oral Daily  . senna  1 tablet Oral BID  . sucralfate  1 g Oral TID WC & HS   Continuous Infusions:   Active Problems:   HTN (hypertension)   Chest pain   Atypical chest pain   Systolic murmur    Time spent: 25 minutes.     Regalado, Belkys A  [facility] Pager [phone]. If 7PM-7AM, please contact night-coverage at [email or web address], password [facility] 05/13/2014, 12:40 PM  LOS: 2 days

## 2014-05-13 NOTE — Progress Notes (Signed)
Patient Name: Wendy Erickson Date of Encounter: 05/13/2014     Active Problems:   HTN (hypertension)   Chest pain   Atypical chest pain   Systolic murmur    SUBJECTIVE  Mild SOB, no further band like epigastric discomfort.  CURRENT MEDS . aspirin EC  325 mg Oral Daily  . atorvastatin  20 mg Oral Daily  . carbamazepine  100 mg Oral BID  . ciprofloxacin  250 mg Oral BID  . cloNIDine  0.2 mg Oral Daily  . FLUoxetine  20 mg Oral Daily  . gabapentin  300 mg Oral BID  . heparin  5,000 Units Subcutaneous 3 times per day  . pantoprazole  40 mg Oral BID AC  . polycarbophil  625 mg Oral Daily  . senna  1 tablet Oral BID  . sucralfate  1 g Oral TID WC & HS    OBJECTIVE  Filed Vitals:   05/12/14 2000 05/12/14 2351 05/13/14 0400 05/13/14 0758  BP: 144/53 126/56 137/65 156/62  Pulse: 73 72 82 84  Temp: 98.8 F (37.1 C) 98.8 F (37.1 C) 98.6 F (37 C) 98.4 F (36.9 C)  TempSrc: Oral Oral Oral Oral  Resp: 18 18 18 18   Weight:   160 lb 15 oz (73 kg)   SpO2: 93% 95% 93% 92%    Intake/Output Summary (Last 24 hours) at 05/13/14 0835 Last data filed at 05/12/14 2100  Gross per 24 hour  Intake    240 ml  Output    775 ml  Net   -535 ml   Filed Weights   05/12/14 0400 05/13/14 0400  Weight: 161 lb 6 oz (73.2 kg) 160 lb 15 oz (73 kg)    PHYSICAL EXAM  General: Pleasant, NAD. Neuro: Alert and oriented X 3. Moves all extremities spontaneously. Psych: Normal affect. HEENT:  Normal  Neck: Supple without bruits or JVD. Lungs:  Resp regular and unlabored, CTA, mild decrease in breath sound Heart: RRR no s3, s4. 1/6 systolic murmur Abdomen: Soft, non-tender, non-distended, BS + x 4.  Extremities: No clubbing, cyanosis or edema. DP/PT/Radials 2+ and equal bilaterally.  Accessory Clinical Findings  CBC  Recent Labs  05/11/14 0734 05/11/14 0814 05/11/14 1545  WBC 5.6  --  9.6  HGB 12.4 12.9 12.9  HCT 36.9 38.0 38.3  MCV 89.1  --  89.5  PLT 263  --  025   Basic  Metabolic Panel  Recent Labs  05/11/14 0734 05/11/14 0814 05/11/14 1545  NA 142 140  --   K 4.8 4.5  --   CL 102 103  --   CO2 31  --   --   GLUCOSE 86 87  --   BUN 34* 34*  --   CREATININE 1.29* 1.40* 1.28*  CALCIUM 9.9  --   --    Liver Function Tests  Recent Labs  05/11/14 0734 05/12/14 1548  AST 15 15  ALT 10 8  ALKPHOS 49 50  BILITOT <0.2* 0.2*  PROT 7.0 6.7  ALBUMIN 3.7 3.5    Recent Labs  05/11/14 0847 05/12/14 1548  LIPASE 67* 29   Cardiac Enzymes  Recent Labs  05/11/14 1545 05/11/14 1710 05/11/14 1952  TROPONINI <0.30 <0.30 <0.30    TELE NSR with HR 70s, no significant ventricular ectopy    ECG  No new EKG EKG 05/11/2014 NSR with ?Q wave in inferior lead, abnormal R wave progression in anterior lead  Echocardiogram   ------------------------------------------------------------------- LV EF: 60% -  65%  ------------------------------------------------------------------- Indications: Chest pain 786.51. Murmur 785.2.  ------------------------------------------------------------------- History: PMH: Acute kidney injury. Risk factors: Hypertension.  ------------------------------------------------------------------- Study Conclusions  - Left ventricle: The cavity size was normal. There was mild concentric hypertrophy. Systolic function was normal. The estimated ejection fraction was in the range of 60% to 65%. There was dynamic mid-cavity obliteration with resting gradient 32 mmHg. Wall motion was normal; there were no regional wall motion abnormalities. Doppler parameters are consistent with abnormal left ventricular relaxation (grade 1 diastolic dysfunction). Doppler parameters are consistent with elevated ventricular end-diastolic filling pressure. - Aortic valve: Trileaflet; normal thickness leaflets. Transvalvular velocity was within the normal range. There was no stenosis. There was no regurgitation. - Aortic root: The aortic  root was normal in size. - Mitral valve: Structurally normal valve. - Left atrium: The atrium was normal in size. - Right ventricle: Systolic function was normal. - Right atrium: The atrium was normal in size. - Tricuspid valve: There was trivial regurgitation. - Pulmonic valve: There was no regurgitation. - Pulmonary arteries: Systolic pressure was within the normal range. - Pericardium, extracardiac: There was no pericardial effusion.       Radiology/Studies  Dg Chest 2 View  05/11/2014   CLINICAL DATA:  Chest pain and shortness of breath for 1 day. Initial encounter.  EXAM: CHEST  2 VIEW  COMPARISON:  05/22/2013; 10/30/2012  FINDINGS: Grossly unchanged cardiac silhouette and mediastinal contours. Evaluation of the retrosternal clear space obscured secondary to overlying soft tissues. Bibasilar heterogeneous opacities are unchanged. No new focal airspace opacities. No pleural effusion or pneumothorax. No evidence of edema. Unchanged bones.  IMPRESSION: Grossly unchanged bibasilar atelectasis/scar without acute cardiopulmonary disease.   Electronically Signed   By: Sandi Mariscal M.D.   On: 05/11/2014 08:08   Nm Myocar Multi W/spect W/wall Motion / Ef  05/12/2014   CLINICAL DATA:  Patient is a 73 yo wwith history of CP Test to evaluate, rule out ischemia  EXAM: MYOCARDIAL IMAGING WITH SPECT (REST AND PHARMACOLOGIC-STRESS)  GATED LEFT VENTRICULAR WALL MOTION STUDY  LEFT VENTRICULAR EJECTION FRACTION  TECHNIQUE: Standard myocardial SPECT imaging was performed after resting intravenous injection of 10 mCi Tc-78m sestamibi. Subsequently, intravenous infusion of Lexiscan was performed under the supervision of the Cardiology staff. At peak effect of the drug, 30 mCi Tc-59m sestamibi was injected intravenously and standard myocardial SPECT imaging was performed. Quantitative gated imaging was also performed to evaluate left ventricular wall motion, and estimate left ventricular ejection fraction.   COMPARISON:  None.  FINDINGS: Stress Data: Baseline EKG SR 69 bpm Nonspecific ST T wave changes. WIth infusion of Lexiscan there were no significant changes beyond baseline to suggest ischemia.  Nuclear data: In the initial stress images there was minimal thinning in the mid anteroseptal wall. Otherwise normal perfusion. On the recovery images there was no significant change.  ON gating LVEF was calculated at 81 % with normal wall motion. Review of the raw data, there is some soft tissue (breast) overlying the anterior walll  Impression: Lexiscan myoview: Electrically negative for ischemia. Myoview scan with probable normal perfusion and minimal soft tissue attenuation (breast) LVEF calculated at 81% Overall low risk scan.  *2012 Appropriate Use Criteria for Coronary Revascularization Focused Update: J Am Coll Cardiol. 3419;62(2):297-989. http://content.[email or web address].aspx?articleid=1201161   Electronically Signed   By: Dorris Carnes M.D.   On: 05/12/2014 14:46   US Abdomen Limited  05/12/2014   CLINICAL DATA:  Epigastric pain.  EXAM: US ABDOMEN LIMITED - RIGHT UPPER QUADRANT  COMPARISON:  CT 08/30/2013  FINDINGS: Gallbladder:  There is a large mobile shadowing echogenic gallstone within the gallbladder lumen. No gallbladder wall thickening. No pericholecystic fluid. Equivocal sonographic Murphy sign.  Common bile duct:  Diameter: 5 mm  Liver:  Normal hepatic parenchymal echogenicity. There is a 7 mm hypodense lesion within the left lobe of the liver, too small accurately characterize. Multiple low-attenuation lesions were demonstrated on prior CT.  IMPRESSION: Cholelithiasis without sonographic evidence for acute cholecystitis.   Electronically Signed   By: Lovey Newcomer M.D.   On: 05/12/2014 13:34    ASSESSMENT AND PLAN  1. Atypical chest pain/Epigastric pain  - no prior cardiac history, ?Q wave in inferior lead and abnormal R wave progression in anterior lead on EKG, negative serial trop  - negative  Lexiscan stress test, EF 81%  - GI workup in progress, abdomen U/S cholilithiasis, no acute cholecystitis  - pending HIDA scan today  - low probability that current sx is cardiac in origin, will discuss with Dr. Sallyanne Kuster, cardiology potentially sign off  2. HTN: controlled 3. Acute on chronic renal insufficiency 4. Systolic murmur  - Echo 17/04/1504 EF 60-65%, dynamic mid-cavity obliteration with resting gradient 32 mmHg, grade 1 diastolic dysfunc.    Hilbert Corrigan PA-C Pager: [phone]  I have seen and examined the patient along with Almyra Deforest PA-C.  I have reviewed the chart, notes and new data.  I agree with PA's note.  Key new complaints: no further pain, complains of dry mouth and sleepiness Key examination changes: no arrhythmia or signs of HF Key new findings / data: Low risk nuclear study; echo with hyperdynamic LV  PLAN: Beta blockers will be superior to clonidine for her hyperdynamic LV and will have fewer noncardiac side effects. Stop clonidine. No additional cardiac w/u planned at this time.  Sanda Klein, MD, [place] [phone] 05/13/2014, 10:19 AM

## 2014-05-14 ENCOUNTER — Observation Stay (HOSPITAL_COMMUNITY): Payer: Medicare Other

## 2014-05-14 ENCOUNTER — Encounter (HOSPITAL_COMMUNITY): Payer: Self-pay | Admitting: General Practice

## 2014-05-14 DIAGNOSIS — R1013 Epigastric pain: Secondary | ICD-10-CM

## 2014-05-14 DIAGNOSIS — K8020 Calculus of gallbladder without cholecystitis without obstruction: Secondary | ICD-10-CM | POA: Diagnosis present

## 2014-05-14 LAB — CBC
HEMATOCRIT: 36.1 % (ref 36.0–46.0)
Hemoglobin: 12.1 g/dL (ref 12.0–15.0)
MCH: 30 pg (ref 26.0–34.0)
MCHC: 33.5 g/dL (ref 30.0–36.0)
MCV: 89.4 fL (ref 78.0–100.0)
Platelets: 252 10*3/uL (ref 150–400)
RBC: 4.04 MIL/uL (ref 3.87–5.11)
RDW: 13.5 % (ref 11.5–15.5)
WBC: 9.7 10*3/uL (ref 4.0–10.5)

## 2014-05-14 LAB — BASIC METABOLIC PANEL
Anion gap: 14 (ref 5–15)
BUN: 33 mg/dL — ABNORMAL HIGH (ref 6–23)
CO2: 24 mEq/L (ref 19–32)
Calcium: 8.8 mg/dL (ref 8.4–10.5)
Chloride: 101 mEq/L (ref 96–112)
Creatinine, Ser: 1.14 mg/dL — ABNORMAL HIGH (ref 0.50–1.10)
GFR calc non Af Amer: 47 mL/min — ABNORMAL LOW (ref >90)
GFR, EST AFRICAN AMERICAN: 55 mL/min — AB (ref >90)
Glucose, Bld: 120 mg/dL — ABNORMAL HIGH (ref 70–99)
POTASSIUM: 4 meq/L (ref 3.7–5.3)
Sodium: 139 mEq/L (ref 137–147)

## 2014-05-14 MED ORDER — SINCALIDE 5 MCG IJ SOLR
INTRAMUSCULAR | Status: AC
  Administered 2014-05-14: 1.5 ug via INTRAVENOUS
  Filled 2014-05-14: qty 5

## 2014-05-14 MED ORDER — STERILE WATER FOR INJECTION IJ SOLN
INTRAMUSCULAR | Status: AC
  Filled 2014-05-14: qty 10

## 2014-05-14 MED ORDER — OMEPRAZOLE 20 MG PO CPDR: 20.0000 mg | DELAYED_RELEASE_CAPSULE | Freq: Two times a day (BID) | ORAL | Status: DC

## 2014-05-14 MED ORDER — METOPROLOL TARTRATE 50 MG PO TABS: 50.0000 mg | ORAL_TABLET | Freq: Two times a day (BID) | ORAL | Status: DC

## 2014-05-14 MED ORDER — CIPROFLOXACIN HCL 250 MG PO TABS: 250.0000 mg | ORAL_TABLET | Freq: Two times a day (BID) | ORAL | Status: DC

## 2014-05-14 MED ORDER — POLYETHYLENE GLYCOL 3350 17 G PO PACK: 17.0000 g | PACK | Freq: Every day | ORAL | Status: AC | PRN

## 2014-05-14 MED ORDER — SINCALIDE 5 MCG IJ SOLR
0.0200 ug/kg | Freq: Once | INTRAMUSCULAR | Status: AC
  Administered 2014-05-14: 1.5 ug via INTRAVENOUS
  Filled 2014-05-14: qty 5

## 2014-05-14 MED ORDER — INFLUENZA VAC SPLIT QUAD 0.5 ML IM SUSY
0.5000 mL | PREFILLED_SYRINGE | Freq: Once | INTRAMUSCULAR | Status: DC
  Filled 2014-05-14: qty 0.5

## 2014-05-14 MED ORDER — TECHNETIUM TC 99M MEBROFENIN IV KIT
5.0000 | PACK | Freq: Once | INTRAVENOUS | Status: AC | PRN
  Administered 2014-05-14: 5 via INTRAVENOUS

## 2014-05-14 NOTE — Plan of Care (Signed)
Problem: Consults Goal: Chest Pain Patient Education (See Patient Education module for education specifics.)  Outcome: Completed/Met Date Met:  05/14/14 Denies Chest pain  Goal: Skin Care Protocol Initiated - if Braden Score 18 or less If consults are not indicated, leave blank or document N/A  N/A Goal: Diabetes Guidelines if Diabetic/Glucose > 140 If diabetic or lab glucose is > 140 mg/dl - Initiate Diabetes/Hyperglycemia Guidelines & Document Interventions  N/A  Problem: Phase III Progression Outcomes Goal: If positive for MI, change to MI Path N/A

## 2014-05-14 NOTE — ED Provider Notes (Signed)
Medical screening examination/treatment/procedure(s) were conducted as a shared visit with non-physician practitioner(s) and myself.  I personally evaluated the patient during the encounter.   EKG Interpretation   Date/Time:  Sunday May 11 2014 09:00:11 EDT Ventricular Rate:  58 PR Interval:  208 QRS Duration: 104 QT Interval:  465 QTC Calculation: 457 R Axis:   -6 Text Interpretation:  Sinus rhythm Inferior infarct, old ED PHYSICIAN  INTERPRETATION AVAILABLE IN [place] Confirmed by TEST, Record  ([id number]) on 05/13/2014 7:42:23 AM        Artis Delay, MD 05/14/14 4462

## 2014-05-14 NOTE — Discharge Summary (Signed)
Physician Discharge Summary  Wendy Erickson GLO:[id number] DOB: 04/23/1941 DOA: 05/11/2014  PCP: Mathews Argyle, MD  Admit date: 05/11/2014 Discharge date: 05/14/2014  Time spent: 45 minutes  Recommendations for Outpatient Follow-up:  1. PCP in 1 week 2. Dr.Outlaw, Gi in 2 weeks as needed  Discharge Diagnoses:    Abdominal pain, epigastric   Atypical chest pain   HTN (hypertension)   Atypical chest pain   Systolic murmur   Cholelithiasis   Renal cell carcinoma status post left nephrectomy   Depression  Discharge Condition: stable  Diet recommendation: heart healthy  Filed Weights   05/12/14 0400 05/13/14 0400 05/14/14 0558  Weight: 73.2 kg (161 lb 6 oz) 73 kg (160 lb 15 oz) 73.1 kg (161 lb 2.5 oz)    History of present illness:  Chief Complaint: Chest pain/EPigastric pain HPI: Wendy Erickson is a 73 y.o. female Past medical history renal cell carcinoma status post left nephrectomy, hypertension and depression, Tobacco he used And quit about7 years ago That comes into the ER complaining of chest pain Over the last 24 hours that woke her up from her sleep. She relates is substernal worse with movement but not with exertion, nothing makes it Worse No relationships with food or any pattern. She denies any shortness of breath palpitation nausea or vomiting.   Hospital Course:  1-Chest pain/EPigastric pain- -atypical, seen by Cardiology and [facility] and Myoview normal -subsequently seen by GI Dr.Outlaw, started on PPI BID and laxatives  -Abdominal US; with cholelithiasis without cholecystitis -HIDA scan 10/7 normal -at this time, symptomatically improved and will be discharged back on PPI BID and laxatives -may FU with Gi Dr.Outlaw in the future should symptoms recur to consider EGD .  2-UTI;  -Continue with cipro for 2 more days for 5 total, Urine Cx pending at discharge   3-HTN; clonidine changed to metoprolol per Cards  Procedures:  Myoview-negative  HIDA scan: no  evidence of acute cholecystitis  [place]: cholelithiasis  ECHO: Study Conclusions - Left ventricle: The cavity size was normal. There was mild concentric hypertrophy. Systolic function was normal. The estimated ejection fraction was in the range of 60% to 65%. There was dynamic mid-cavity obliteration with resting gradient 32 mmHg. Wall motion was normal; there were no regional wall motion abnormalities. Doppler parameters are consistent with abnormal left ventricular relaxation (grade 1 diastolic dysfunction). Doppler parameters are consistent with elevated ventricular end-diastolic filling pressure. - Aortic valve: Trileaflet; normal thickness leaflets. Transvalvular velocity was within the normal range. There was no stenosis. There was no regurgitation. - Aortic root: The aortic root was normal in size. - Mitral valve: Structurally normal valve. - Left atrium: The atrium was normal in size. - Right ventricle: Systolic function was normal. - Right atrium: The atrium was normal in size. - Tricuspid valve: There was trivial regurgitation. - Pulmonic valve: There was no regurgitation. - Pulmonary arteries: Systolic pressure was within the normal range. - Pericardium, extracardiac: There was no pericardial effusion.       Consultations:  Gi Dr.Outlaw  Discharge Exam: Filed Vitals:   05/14/14 1359  BP: 111/52  Pulse: 50  Temp: 98.5 F (36.9 C)  Resp: 20    General: AAOx3 Cardiovascular: S1S2/RRR Respiratory: CTAB  Discharge Instructions You were cared for by a hospitalist during your hospital stay. If you have any questions about your discharge medications or the care you received while you were in the hospital after you are discharged, you can call the unit and asked to speak with the  hospitalist on call if the hospitalist that took care of you is not available. Once you are discharged, your primary care physician will handle any further medical issues. Please note that  NO REFILLS for any discharge medications will be authorized once you are discharged, as it is imperative that you return to your primary care physician (or establish a relationship with a primary care physician if you do not have one) for your aftercare needs so that they can reassess your need for medications and monitor your lab values.  Discharge Instructions   Diet - low sodium heart healthy    Complete by:  As directed      Increase activity slowly    Complete by:  As directed           Current Discharge Medication List    START taking these medications   Details  ciprofloxacin (CIPRO) 250 MG tablet Take 1 tablet (250 mg total) by mouth 2 (two) times daily. For 2 days Qty: 4 tablet, Refills: 0    metoprolol (LOPRESSOR) 50 MG tablet Take 1 tablet (50 mg total) by mouth 2 (two) times daily. Qty: 60 tablet, Refills: 0    polyethylene glycol (MIRALAX) packet Take 17 g by mouth daily as needed for moderate constipation. Qty: 10 each, Refills: 0      CONTINUE these medications which have CHANGED   Details  omeprazole (PRILOSEC) 20 MG capsule Take 1 capsule (20 mg total) by mouth 2 (two) times daily before a meal.      CONTINUE these medications which have NOT CHANGED   Details  acetaminophen (TYLENOL) 650 MG CR tablet Take 1,300 mg by mouth every 8 (eight) hours as needed for pain.     aspirin 81 MG tablet Take 81 mg by mouth daily.    atorvastatin (LIPITOR) 20 MG tablet Take 20 mg by mouth daily.    Calcium Carbonate-Vitamin D (CALCARB 600/D) 600-400 MG-UNIT per tablet Take 1 tablet by mouth daily.    carbamazepine (TEGRETOL) 100 MG chewable tablet Chew 1 tablet (100 mg total) by mouth 2 (two) times daily. Qty: 60 tablet, Refills: 5    chlorpheniramine (ALLERGY) 4 MG tablet Take 4 mg by mouth 2 (two) times daily as needed for allergies.    cholecalciferol (VITAMIN D) 1000 UNITS tablet Take 2,000 Units by mouth every morning.    ferrous sulfate 325 (65 FE) MG EC tablet  Take 325 mg by mouth daily with breakfast.    fluocinonide (LIDEX) 0.05 % external solution Apply 1 application topically daily.    FLUoxetine (PROZAC) 20 MG capsule Take 20 mg by mouth daily.    gabapentin (NEURONTIN) 300 MG capsule Take 300 mg by mouth 2 (two) times daily.     Melatonin 3 MG TABS Take 3 mg by mouth at bedtime as needed (for sleep).     polycarbophil (FIBERCON) 625 MG tablet Take 625 mg by mouth daily.    senna (SENOKOT) 8.6 MG tablet Take 1 tablet by mouth 2 (two) times daily.    traMADol-acetaminophen (ULTRACET) 37.5-325 MG per tablet Take 1 tablet by mouth every 6 (six) hours as needed for moderate pain.     zolpidem (AMBIEN) 5 MG tablet Take 5 mg by mouth at bedtime as needed for sleep.       STOP taking these medications     cloNIDine (CATAPRES) 0.2 MG tablet        No Known Allergies Follow-up Information   Follow up with Mathews Argyle, MD.  Schedule an appointment as soon as possible for a visit in 1 week.   Specialty:  Internal Medicine   Contact information:   [address] [place] [address] [phone]       Follow up with Landry Dyke, MD. Schedule an appointment as soon as possible for a visit in 2 weeks. (As needed)    Specialty:  Gastroenterology   Contact information:   [address], [place] [address] [phone]        The results of significant diagnostics from this hospitalization (including imaging, microbiology, ancillary and laboratory) are listed below for reference.    Significant Diagnostic Studies: Dg Chest 2 View  05/11/2014   CLINICAL DATA:  Chest pain and shortness of breath for 1 day. Initial encounter.  EXAM: CHEST  2 VIEW  COMPARISON:  05/22/2013; 10/30/2012  FINDINGS: Grossly unchanged cardiac silhouette and mediastinal contours. Evaluation of the retrosternal clear space obscured secondary to overlying soft tissues. Bibasilar heterogeneous opacities are unchanged. No  new focal airspace opacities. No pleural effusion or pneumothorax. No evidence of edema. Unchanged bones.  IMPRESSION: Grossly unchanged bibasilar atelectasis/scar without acute cardiopulmonary disease.   Electronically Signed   By: Sandi Mariscal M.D.   On: 05/11/2014 08:08   Ct Head Wo Contrast  05/13/2014   CLINICAL DATA:  Dizziness, history hypertension, chronic kidney disease  EXAM: CT HEAD WITHOUT CONTRAST  TECHNIQUE: Contiguous axial images were obtained from the base of the skull through the vertex without intravenous contrast.  COMPARISON:  04/05/2011  FINDINGS: Generalized cerebral and cerebellar atrophy.  Normal ventricular morphology.  No midline shift or mass effect.  Otherwise normal appearance of brain parenchyma.  No intracranial hemorrhage, mass lesion, or evidence acute infarction.  No extra-axial fluid collections.  Atherosclerotic calcifications of the internal carotid arteries at the carotid siphons.  No acute bone or sinus abnormalities.  IMPRESSION: Generalized atrophy.  No acute intracranial abnormalities.  No significant interval change.   Electronically Signed   By: Lavonia Dana M.D.   On: 05/13/2014 17:08   Nm Hepatobiliary  05/14/2014   CLINICAL DATA:  Gastric pain and tightness. Ultrasound demonstrating cholelithiasis. Evaluate for biliary dyskinesia. Subsequent encounter.  EXAM: NUCLEAR MEDICINE HEPATOBILIARY IMAGING WITH GALLBLADDER EF  TECHNIQUE: Sequential images of the abdomen were obtained out to 60 minutes following intravenous administration of radiopharmaceutical. After slow intravenous infusion of 1.5 micrograms Cholecystokinin, gallbladder ejection fraction was determined.  RADIOPHARMACEUTICALS:  5 [place] VO-16W Choletec  COMPARISON:  CT abdomen pelvis - 08/30/2013; abdominal ultrasound -05/12/2014  FINDINGS: There is homogeneous distribution of radiotracer throughout the hepatic parenchyma. There is early filling of the gallbladder, initially seen on the 15 min anterior  projection planar image.  There is brisk excretion of radiotracer with opacification of the proximal small bowel, initially seen on the 55 min anterior projection planar image.  Following the administration of CCK, there is brisk emptying of the gallbladder with estimated gallbladder ejection fraction of 57% at 30 min. At 30 min, normal ejection fraction is greater than 30%.  The patient did not experience symptoms during the CCK infusion.  IMPRESSION: No scintigraphic evidence of acute cholecystitis or biliary dyskinesia. Normal gallbladder ejection fraction.   Electronically Signed   By: Sandi Mariscal M.D.   On: 05/14/2014 14:08   Nm Myocar Multi W/spect W/wall Motion / Ef  05/12/2014   CLINICAL DATA:  Patient is a 73 yo wwith history of CP Test to evaluate, rule out ischemia  EXAM:  MYOCARDIAL IMAGING WITH SPECT (REST AND PHARMACOLOGIC-STRESS)  GATED LEFT VENTRICULAR WALL MOTION STUDY  LEFT VENTRICULAR EJECTION FRACTION  TECHNIQUE: Standard myocardial SPECT imaging was performed after resting intravenous injection of 10 mCi Tc-43m sestamibi. Subsequently, intravenous infusion of Lexiscan was performed under the supervision of the Cardiology staff. At peak effect of the drug, 30 mCi Tc-65m sestamibi was injected intravenously and standard myocardial SPECT imaging was performed. Quantitative gated imaging was also performed to evaluate left ventricular wall motion, and estimate left ventricular ejection fraction.  COMPARISON:  None.  FINDINGS: Stress Data: Baseline EKG SR 69 bpm Nonspecific ST T wave changes. WIth infusion of Lexiscan there were no significant changes beyond baseline to suggest ischemia.  Nuclear data: In the initial stress images there was minimal thinning in the mid anteroseptal wall. Otherwise normal perfusion. On the recovery images there was no significant change.  ON gating LVEF was calculated at 81 % with normal wall motion. Review of the raw data, there is some soft tissue (breast)  overlying the anterior walll  Impression: Lexiscan myoview: Electrically negative for ischemia. Myoview scan with probable normal perfusion and minimal soft tissue attenuation (breast) LVEF calculated at 81% Overall low risk scan.  *2012 Appropriate Use Criteria for Coronary Revascularization Focused Update: J Am Coll Cardiol. 5329;92(4):268-341. http://content.airportbarriers.com.aspx?articleid=1201161   Electronically Signed   By: Dorris Carnes M.D.   On: 05/12/2014 14:46   US Abdomen Limited  05/12/2014   CLINICAL DATA:  Epigastric pain.  EXAM: US ABDOMEN LIMITED - RIGHT UPPER QUADRANT  COMPARISON:  CT 08/30/2013  FINDINGS: Gallbladder:  There is a large mobile shadowing echogenic gallstone within the gallbladder lumen. No gallbladder wall thickening. No pericholecystic fluid. Equivocal sonographic Murphy sign.  Common bile duct:  Diameter: 5 mm  Liver:  Normal hepatic parenchymal echogenicity. There is a 7 mm hypodense lesion within the left lobe of the liver, too small accurately characterize. Multiple low-attenuation lesions were demonstrated on prior CT.  IMPRESSION: Cholelithiasis without sonographic evidence for acute cholecystitis.   Electronically Signed   By: Lovey Newcomer M.D.   On: 05/12/2014 13:34    Microbiology: No results found for this or any previous visit (from the past 240 hour(s)).   Labs: Basic Metabolic Panel:  Recent Labs Lab 05/11/14 0734 05/11/14 0814 05/11/14 1545 05/13/14 0950 05/14/14 0429  NA 142 140  --  141 139  K 4.8 4.5  --  4.2 4.0  CL 102 103  --  102 101  CO2 31  --   --  28 24  GLUCOSE 86 87  --  121* 120*  BUN 34* 34*  --  36* 33*  CREATININE 1.29* 1.40* 1.28* 1.35* 1.14*  CALCIUM 9.9  --   --  9.0 8.8   Liver Function Tests:  Recent Labs Lab 05/11/14 0734 05/12/14 1548 05/13/14 0950  AST 15 15 13   ALT 10 8 8   ALKPHOS 49 50 51  BILITOT <0.2* 0.2* <0.2*  PROT 7.0 6.7 6.8  ALBUMIN 3.7 3.5 3.4*    Recent Labs Lab 05/11/14 0847  05/12/14 1548 05/13/14 0950  LIPASE 67* 29 32   No results found for this basename: AMMONIA,  in the last 168 hours CBC:  Recent Labs Lab 05/11/14 0734 05/11/14 0814 05/11/14 1545 05/14/14 0429  WBC 5.6  --  9.6 9.7  HGB 12.4 12.9 12.9 12.1  HCT 36.9 38.0 38.3 36.1  MCV 89.1  --  89.5 89.4  PLT 263  --  253 252  Cardiac Enzymes:  Recent Labs Lab 05/11/14 0734 05/11/14 1545 05/11/14 1710 05/11/14 1952  TROPONINI <0.30 <0.30 <0.30 <0.30   BNP: BNP (last 3 results) No results found for this basename: PROBNP,  in the last 8760 hours CBG:  Recent Labs Lab 05/13/14 1208  GLUCAP 104*       Signed:  Ryhanna Dunsmore  [facility] 05/14/2014, 3:06 PM

## 2014-05-15 ENCOUNTER — Ambulatory Visit: Payer: Medicare Other | Admitting: Neurology

## 2014-05-16 ENCOUNTER — Ambulatory Visit: Payer: Medicare Other | Admitting: Neurology

## 2014-06-05 ENCOUNTER — Encounter: Payer: Self-pay | Admitting: Neurology

## 2014-06-05 ENCOUNTER — Ambulatory Visit (INDEPENDENT_AMBULATORY_CARE_PROVIDER_SITE_OTHER): Payer: Medicare Other | Admitting: Neurology

## 2014-06-05 VITALS — BP 146/70 | HR 46

## 2014-06-05 DIAGNOSIS — G118 Other hereditary ataxias: Secondary | ICD-10-CM

## 2014-06-05 MED ORDER — CARBAMAZEPINE 200 MG PO TABS: 200.0000 mg | ORAL_TABLET | Freq: Two times a day (BID) | ORAL | Status: DC

## 2014-06-05 NOTE — Patient Instructions (Signed)

## 2014-06-05 NOTE — Progress Notes (Signed)
Reason for visit: SCA type 3  Wendy Erickson is an 73 y.o. 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 The patient reports numbness in the feet, which also has total body itching sensations that is more significant at night. Most of the itching sensations are in the right V1 distribution. She was placed on carbamazepine when she was seen last, and she believes this helped this discomfort, but the problem has become more significant over time. The patient also takes gabapentin for the neuropathy discomfort. The patient is not ambulatory. She indicates that she lives alone at home, she does require some assistance from family members at times. The patient does not operate a motor vehicle. She does fall on occasion, the last fall was 2 weeks ago. She does report some issues with coughing after eating, and she indicates that liquids offered more problems than solids. The patient returns to this office for an evaluation. The patient was in the hospital recently with chest pain.  Past Medical History  Diagnosis Date  . Hypertension   . 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 Osteopenia   . Shingles     Right V1 distribution  . Foot fracture, right   . 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 Peripheral neuropathy   . Gait disorder   . Heart murmur   . High cholesterol   . Chronic kidney disease     left renal neoplasm  . Arthritis     "knees" (05/14/2014)  . History of gout   . Depression   . Kidney malignancy     left renal neoplasm    Past Surgical History  Procedure Laterality Date  . Tonsillectomy    . Knee arthroscopy Bilateral   . Dilation and curettage of uterus    . Laparoscopic nephrectomy  10/06/2011    Procedure: LAPAROSCOPIC NEPHRECTOMY;  Surgeon: Dutch Gray, MD;   Location: [facility];  Service: Urology;  Laterality: Left;  LEFT LAPAROSCOPIC RADICAL NEPHRECTOMY   . Back surgery    . Lumbar disc surgery      "herniated disc"  . Vaginal hysterectomy    . Cataract extraction w/ intraocular lens implant Left     Family History  Problem Relation Age of Onset  . Ataxia Father   . Dementia Mother     Social history:  reports that she quit smoking about 8 years ago. Her smoking use included Cigarettes. She has a 40 pack-year smoking history. She has never used smokeless tobacco. She reports that she drinks about 1.2 ounces of alcohol per week. She reports that she does not use illicit drugs.   No Known Allergies  Medications:  Current Outpatient Prescriptions on File Prior to Visit  Medication Sig Dispense Refill  . acetaminophen (TYLENOL) 650 MG CR tablet Take 1,300 mg by mouth every 8 (eight) hours as needed for pain.       Marland Kitchen aspirin 81 MG tablet Take 81 mg by mouth daily.      Marland Kitchen atorvastatin (LIPITOR) 20 MG tablet Take 20 mg by mouth daily.      . Calcium Carbonate-Vitamin D (CALCARB 600/D) 600-400 MG-UNIT per tablet Take 1 tablet by mouth daily.      . chlorpheniramine (ALLERGY) 4 MG tablet Take 4 mg by mouth 2 (two) times daily as needed  for allergies.      . cholecalciferol (VITAMIN D) 1000 UNITS tablet Take 2,000 Units by mouth every morning.      . ferrous sulfate 325 (65 FE) MG EC tablet Take 325 mg by mouth daily with breakfast.      . fluocinonide (LIDEX) 0.05 % external solution Apply 1 application topically daily.      Marland Kitchen FLUoxetine (PROZAC) 20 MG capsule Take 20 mg by mouth daily.      Marland Kitchen gabapentin (NEURONTIN) 300 MG capsule Take 300 mg by mouth 2 (two) times daily.       . Melatonin 3 MG TABS Take 3 mg by mouth at bedtime as needed (for sleep).       . metoprolol (LOPRESSOR) 50 MG tablet Take 1 tablet (50 mg total) by mouth 2 (two) times daily.  60 tablet  0  . omeprazole (PRILOSEC) 20 MG capsule Take 1 capsule (20 mg total) by mouth 2  (two) times daily before a meal.      . polycarbophil (FIBERCON) 625 MG tablet Take 625 mg by mouth daily.      . polyethylene glycol (MIRALAX) packet Take 17 g by mouth daily as needed for moderate constipation.  10 each  0  . senna (SENOKOT) 8.6 MG tablet Take 1 tablet by mouth 3 (three) times daily.       . traMADol-acetaminophen (ULTRACET) 37.5-325 MG per tablet Take 1 tablet by mouth every 6 (six) hours as needed for moderate pain.       Marland Kitchen zolpidem (AMBIEN) 5 MG tablet Take 5 mg by mouth at bedtime as needed for sleep.        No current facility-administered medications on file prior to visit.    ROS:  Out of a complete 14 system review of symptoms, the patient complains only of the following symptoms, and all other reviewed systems are negative.  Appetite change, fatigue Difficulty swallowing Eye itching, double vision Chest tightness Chest pain, leg swelling, heart murmur Excessive thirst Abdominal swelling, black stools, constipation, incontinence of bowel Restless legs, Volney Presser waking, daytime sleepiness Difficulty urinating Joint pain, joint swelling, aching muscles, muscle cramps, walking difficulties Itching Numbness, speech difficulty, weakness Decreased concentration, depression  Blood pressure 146/70, pulse 46, height 0' (0 m), weight 0 lb (0 kg).  Physical Exam  General: The patient is alert and cooperative at the time of the examination.  Skin: No significant peripheral edema is noted.   Neurologic Exam  Mental status: The patient is oriented x 3.  Cranial nerves: Facial symmetry is present. Speech is slightly ataxic, not aphasic. Extraocular movements are full, but the patient has prominent coarse nystagmus in all fields of gaze. Visual fields are full.  Motor: The patient has good strength in all 4 extremities.  Sensory examination: Soft touch sensation is symmetric on the face, arms, and legs. There is a stocking pattern pinprick sensory deficit to just  above the knees bilaterally.  Coordination: The patient has significant ataxia with finger-nose-finger and heel-to-shin bilaterally.  Gait and station: The patient is not ambulatory, gait was not tested.  Reflexes: Deep tendon reflexes are symmetric, but are depressed.   Assessment/Plan:  1. SCA type 3  2. Right V1 postherpetic neuralgia  3. Gait disorder  4. Peripheral neuropathy  The patient is having total body itching sensations that is worse in the right V1 distribution. Initially, she indicates that low-dose carbamazepine seem to be helpful, and we will go up on the dose taking 200 mg  twice daily of carbamazepine. She has had blood work done recently that included a CBC and a liver profile. The patient will follow-up in about 6 months. If the medication increase is not helpful, she is to contact our office, we will consider a medication such as Vistaril for the itching.  Jill Alexanders MD 06/05/2014 7:00 PM  [facility] [address] [place], [place] [address]  Phone [phone] Fax [phone]

## 2014-06-25 ENCOUNTER — Encounter: Payer: Self-pay | Admitting: Neurology

## 2014-07-01 ENCOUNTER — Encounter: Payer: Self-pay | Admitting: Neurology

## 2014-08-27 ENCOUNTER — Ambulatory Visit (HOSPITAL_COMMUNITY)
Admission: RE | Admit: 2014-08-27 | Discharge: 2014-08-27 | Disposition: A | Discharge status: Home / Self Care | Payer: Medicare Other | Source: Ambulatory Visit | Attending: Urology | Admitting: Urology

## 2014-08-27 ENCOUNTER — Other Ambulatory Visit (HOSPITAL_COMMUNITY): Payer: Self-pay | Admitting: Urology

## 2014-08-27 DIAGNOSIS — C649 Malignant neoplasm of unspecified kidney, except renal pelvis: Secondary | ICD-10-CM

## 2014-10-29 ENCOUNTER — Emergency Department (HOSPITAL_COMMUNITY)
Admission: EM | Admit: 2014-10-29 | Discharge: 2014-10-29 | Disposition: A | Discharge status: Home / Self Care | Payer: Medicare Other | Attending: Emergency Medicine | Admitting: Emergency Medicine

## 2014-10-29 ENCOUNTER — Emergency Department (HOSPITAL_COMMUNITY): Payer: Medicare Other

## 2014-10-29 ENCOUNTER — Encounter (HOSPITAL_COMMUNITY): Payer: Self-pay

## 2014-10-29 DIAGNOSIS — S8992XA Unspecified injury of left lower leg, initial encounter: Secondary | ICD-10-CM | POA: Diagnosis present

## 2014-10-29 DIAGNOSIS — W19XXXA Unspecified fall, initial encounter: Secondary | ICD-10-CM

## 2014-10-29 DIAGNOSIS — E780 Pure hypercholesterolemia: Secondary | ICD-10-CM | POA: Diagnosis not present

## 2014-10-29 DIAGNOSIS — W1839XA Other fall on same level, initial encounter: Secondary | ICD-10-CM | POA: Insufficient documentation

## 2014-10-29 DIAGNOSIS — Y9289 Other specified places as the place of occurrence of the external cause: Secondary | ICD-10-CM | POA: Insufficient documentation

## 2014-10-29 DIAGNOSIS — I129 Hypertensive chronic kidney disease with stage 1 through stage 4 chronic kidney disease, or unspecified chronic kidney disease: Secondary | ICD-10-CM | POA: Insufficient documentation

## 2014-10-29 DIAGNOSIS — Z87891 Personal history of nicotine dependence: Secondary | ICD-10-CM | POA: Insufficient documentation

## 2014-10-29 DIAGNOSIS — Z85528 Personal history of other malignant neoplasm of kidney: Secondary | ICD-10-CM | POA: Insufficient documentation

## 2014-10-29 DIAGNOSIS — F329 Major depressive disorder, single episode, unspecified: Secondary | ICD-10-CM | POA: Insufficient documentation

## 2014-10-29 DIAGNOSIS — Z79899 Other long term (current) drug therapy: Secondary | ICD-10-CM | POA: Diagnosis not present

## 2014-10-29 DIAGNOSIS — Y998 Other external cause status: Secondary | ICD-10-CM | POA: Diagnosis not present

## 2014-10-29 DIAGNOSIS — Z8669 Personal history of other diseases of the nervous system and sense organs: Secondary | ICD-10-CM | POA: Insufficient documentation

## 2014-10-29 DIAGNOSIS — M1990 Unspecified osteoarthritis, unspecified site: Secondary | ICD-10-CM | POA: Insufficient documentation

## 2014-10-29 DIAGNOSIS — N390 Urinary tract infection, site not specified: Secondary | ICD-10-CM | POA: Diagnosis not present

## 2014-10-29 DIAGNOSIS — N189 Chronic kidney disease, unspecified: Secondary | ICD-10-CM | POA: Diagnosis not present

## 2014-10-29 DIAGNOSIS — S82832A Other fracture of upper and lower end of left fibula, initial encounter for closed fracture: Secondary | ICD-10-CM

## 2014-10-29 DIAGNOSIS — R011 Cardiac murmur, unspecified: Secondary | ICD-10-CM | POA: Insufficient documentation

## 2014-10-29 DIAGNOSIS — Y9389 Activity, other specified: Secondary | ICD-10-CM | POA: Insufficient documentation

## 2014-10-29 DIAGNOSIS — Z7982 Long term (current) use of aspirin: Secondary | ICD-10-CM | POA: Diagnosis not present

## 2014-10-29 LAB — URINALYSIS, ROUTINE W REFLEX MICROSCOPIC
BILIRUBIN URINE: NEGATIVE
GLUCOSE, UA: NEGATIVE mg/dL
HGB URINE DIPSTICK: NEGATIVE
KETONES UR: NEGATIVE mg/dL
Nitrite: POSITIVE — AB
PROTEIN: NEGATIVE mg/dL
Specific Gravity, Urine: 1.015 (ref 1.005–1.030)
Urobilinogen, UA: 0.2 mg/dL (ref 0.0–1.0)
pH: 6 (ref 5.0–8.0)

## 2014-10-29 LAB — URINE MICROSCOPIC-ADD ON

## 2014-10-29 MED ORDER — CEPHALEXIN 500 MG PO CAPS: 500.0000 mg | ORAL_CAPSULE | Freq: Three times a day (TID) | ORAL | Status: AC

## 2014-10-29 MED ORDER — CEPHALEXIN 500 MG PO CAPS
500.0000 mg | ORAL_CAPSULE | ORAL | Status: AC
  Administered 2014-10-29: 500 mg via ORAL
  Filled 2014-10-29: qty 1

## 2014-10-29 MED ORDER — HYDROCODONE-ACETAMINOPHEN 5-325 MG PO TABS
1.0000 | ORAL_TABLET | Freq: Once | ORAL | Status: AC
  Administered 2014-10-29: 1 via ORAL
  Filled 2014-10-29: qty 1

## 2014-10-29 MED ORDER — HYDROCODONE-ACETAMINOPHEN 5-325 MG PO TABS: 2.0000 | ORAL_TABLET | Freq: Four times a day (QID) | ORAL | Status: DC | PRN

## 2014-10-29 NOTE — ED Notes (Signed)
Pharmacy at bedside. Pt is aware that a urine sample is needed.

## 2014-10-29 NOTE — ED Notes (Signed)
[facility] called for transport.  

## 2014-10-29 NOTE — ED Notes (Signed)
Pt presents with EMS from [facility] assisted living facility. Per EMS, pt reported approx 1 this morning, now c/o bilateral knee pain and lower back pain. Pt usually uses a wheelchair at the facility. No deformities noted by EMS.

## 2014-10-29 NOTE — ED Notes (Signed)
Patient transported to X-ray 

## 2014-10-29 NOTE — ED Notes (Signed)
MD at bedside. 

## 2014-10-29 NOTE — ED Notes (Signed)
Bed: WA05 Expected date:  Expected time:  Means of arrival:  Comments: EMS-fall 

## 2014-10-29 NOTE — ED Notes (Signed)
Ortho tech called for knee immoiblizer placement.

## 2014-10-29 NOTE — ED Provider Notes (Signed)
CSN: [id number]     Arrival date & time 10/29/14  0757 History   First MD Initiated Contact with Patient 10/29/14 [phone]     Chief Complaint  Patient presents with  . Fall     (Consider location/radiation/quality/duration/timing/severity/associated sxs/prior Treatment) Patient is a 74 y.o. female presenting with fall. The history is provided by the patient.  Fall This is a new problem. The current episode started 3 to 5 hours ago. Episode frequency: once. The problem has been resolved. Pertinent negatives include no chest pain, no abdominal pain, no headaches and no shortness of breath. Nothing aggravates the symptoms. Nothing relieves the symptoms. She has tried nothing for the symptoms. The treatment provided no relief.    Past Medical History  Diagnosis Date  . Hypertension   . 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 Osteopenia   . Shingles     Right V1 distribution  . Foot fracture, right   . 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 Peripheral neuropathy   . Gait disorder   . Heart murmur   . High cholesterol   . Chronic kidney disease     left renal neoplasm  . Arthritis     "knees" (05/14/2014)  . History of gout   . Depression   . Kidney malignancy     left renal neoplasm   Past Surgical History  Procedure Laterality Date  . Tonsillectomy    . Knee arthroscopy Bilateral   . Dilation and curettage of uterus    . Laparoscopic nephrectomy  10/06/2011    Procedure: LAPAROSCOPIC NEPHRECTOMY;  Surgeon: Dutch Gray, MD;  Location: [facility];  Service: Urology;  Laterality: Left;  LEFT LAPAROSCOPIC RADICAL NEPHRECTOMY   . Back surgery    . Lumbar disc surgery      "herniated disc"  . Vaginal hysterectomy    . Cataract extraction w/ intraocular lens implant Left    Family History  Problem Relation Age of Onset  . Ataxia Father   . Dementia Mother    History  Substance Use Topics  . Smoking status: Former Smoker -- 1.00 packs/day for 40 years    Types:  Cigarettes    Quit date: 08/08/2005  . Smokeless tobacco: Never Used  . Alcohol Use: 1.2 oz/week    2 Glasses of wine per week     Comment: wine on Friday   OB History    No data available     Review of Systems  Constitutional: Negative for fever and fatigue.  HENT: Negative for congestion and drooling.   Eyes: Negative for pain.  Respiratory: Negative for cough and shortness of breath.   Cardiovascular: Negative for chest pain.  Gastrointestinal: Negative for nausea, vomiting, abdominal pain and diarrhea.  Genitourinary: Negative for dysuria and hematuria.  Musculoskeletal: Negative for back pain, gait problem and neck pain.  Skin: Negative for color change.  Neurological: Negative for dizziness and headaches.  Hematological: Negative for adenopathy.  Psychiatric/Behavioral: Negative for behavioral problems.  All other systems reviewed and are negative.     Allergies  Review of patient's allergies indicates no known allergies.  Home Medications   Prior to Admission medications   Medication Sig Start Date End Date Taking? Authorizing Provider  acetaminophen (TYLENOL) 650 MG CR tablet Take 1,300 mg by mouth every 8 (eight) hours as needed for pain.     Historical Provider, MD  aspirin 81 MG tablet Take 81 mg by mouth daily.    Historical Provider, MD  atorvastatin (LIPITOR) 20 MG tablet Take 20 mg by mouth daily. 10/28/13   Historical Provider, MD  Calcium Carbonate-Vitamin D (CALCARB 600/D) 600-400 MG-UNIT per tablet Take 1 tablet by mouth daily.    Historical Provider, MD  carbamazepine (TEGRETOL) 200 MG tablet Take 1 tablet (200 mg total) by mouth 2 (two) times daily. 06/05/14   Kathrynn Ducking, MD  chlorpheniramine (ALLERGY) 4 MG tablet Take 4 mg by mouth 2 (two) times daily as needed for allergies.    Historical Provider, MD  cholecalciferol (VITAMIN D) 1000 UNITS tablet Take 2,000 Units by mouth every morning.    Historical Provider, MD  ferrous sulfate 325 (65 FE) MG  EC tablet Take 325 mg by mouth daily with breakfast.    Historical Provider, MD  fluocinonide (LIDEX) 0.05 % external solution Apply 1 application topically daily. 04/10/14   Historical Provider, MD  FLUoxetine (PROZAC) 20 MG capsule Take 20 mg by mouth daily.    Historical Provider, MD  gabapentin (NEURONTIN) 300 MG capsule Take 300 mg by mouth 2 (two) times daily.     Historical Provider, MD  Melatonin 3 MG TABS Take 3 mg by mouth at bedtime as needed (for sleep).     Historical Provider, MD  metoprolol (LOPRESSOR) 50 MG tablet Take 1 tablet (50 mg total) by mouth 2 (two) times daily. 05/14/14   Domenic Polite, MD  omeprazole (PRILOSEC) 20 MG capsule Take 1 capsule (20 mg total) by mouth 2 (two) times daily before a meal. 05/14/14   Domenic Polite, MD  polycarbophil (FIBERCON) 625 MG tablet Take 625 mg by mouth daily.    Historical Provider, MD  polyethylene glycol (MIRALAX) packet Take 17 g by mouth daily as needed for moderate constipation. 05/14/14   Domenic Polite, MD  senna (SENOKOT) 8.6 MG tablet Take 1 tablet by mouth 3 (three) times daily.     Historical Provider, MD  traMADol-acetaminophen (ULTRACET) 37.5-325 MG per tablet Take 1 tablet by mouth every 6 (six) hours as needed for moderate pain.  02/18/13   Historical Provider, MD  zolpidem (AMBIEN) 5 MG tablet Take 5 mg by mouth at bedtime as needed for sleep.  10/28/13   Historical Provider, MD   BP 161/90 mmHg  Pulse 67  Temp(Src) 97.7 F (36.5 C) (Oral)  Resp 18  SpO2 100% Physical Exam  Constitutional: She is oriented to person, place, and time. She appears well-developed and well-nourished.  HENT:  Head: Normocephalic and atraumatic.  Mouth/Throat: Oropharynx is clear and moist. No oropharyngeal exudate.  Eyes: Conjunctivae and EOM are normal. Pupils are equal, round, and reactive to light.  Neck: Normal range of motion. Neck supple.  No vertebral ttp.   Cardiovascular: Normal rate, regular rhythm, normal heart sounds and intact  distal pulses.  Exam reveals no gallop and no friction rub.   No murmur heard. Pulmonary/Chest: Effort normal and breath sounds normal. No respiratory distress. She has no wheezes.  Abdominal: Soft. Bowel sounds are normal. There is no tenderness. There is no rebound and no guarding.  Musculoskeletal: Normal range of motion. She exhibits tenderness (mild tenderness to palpation of the left lateral knee and left proximal fibula.). She exhibits no edema.  Normal range of motion of the hips bilaterally without tenderness.  Mildly limited range of motion of the left knee due to pain.  Normal motor skills in the bilateral lower extremities. Normal sensation in the bilateral lower extremities.  1+ distal DP pulses. Mild purplish discoloration of the digits of  her toes bilaterally which are mildly cool to touch.  Neurological: She is alert and oriented to person, place, and time.  Skin: Skin is warm and dry.  Psychiatric: She has a normal mood and affect. Her behavior is normal.  Nursing note and vitals reviewed.   ED Course  Procedures (including critical care time) Labs Review Labs Reviewed  URINALYSIS, ROUTINE W REFLEX MICROSCOPIC - Abnormal; Notable for the following:    APPearance CLOUDY (*)    Nitrite POSITIVE (*)    Leukocytes, UA SMALL (*)    All other components within normal limits  URINE MICROSCOPIC-ADD ON - Abnormal; Notable for the following:    Squamous Epithelial / LPF FEW (*)    Bacteria, UA MANY (*)    All other components within normal limits  URINE CULTURE    Imaging Review Dg Pelvis 1-2 Views  10/29/2014   CLINICAL DATA:  73 year old fell last evening with pain left side of body. Initial encounter.  EXAM: PELVIS - 1-2 VIEW  COMPARISON:  None.  FINDINGS: No evidence of fracture. If there is hip pain, cone-down hip films may be considered. Mild bilateral hip joint degenerative changes. Mild degenerative changes pubic symphysis.  Mild degenerative changes lower lumbar  spine.  Evaluation of the mid to lower sacrum limited by overlying stool. No obvious fracture noted.  IMPRESSION: No fracture detected.  Please see above.   Electronically Signed   By: Genia Del M.D.   On: 10/29/2014 09:33   Dg Tibia/fibula Left  10/29/2014   CLINICAL DATA:  74 year old female post fall with left-sided pain.  EXAM: LEFT TIBIA AND FIBULA - 2 VIEW  COMPARISON:  Left knee films dictated separately.  FINDINGS: Proximal left fibular fracture just below the fibular head. Otherwise no evidence of fracture detected.  CPPD changes of the knee.  Vascular calcifications.  IMPRESSION: Left proximal fibula/fibular head fracture.   Electronically Signed   By: Genia Del M.D.   On: 10/29/2014 09:37   Dg Knee Complete 4 Views Left  10/29/2014   CLINICAL DATA:  Fall.  Left-sided pain.  Left knee pain.  EXAM: LEFT KNEE - COMPLETE 4+ VIEW  COMPARISON:  None.  FINDINGS: A nondisplaced fracture is present in the proximal left fibula. Degenerative changes are noted in the knee joint. A small effusion is present. Chondrocalcinosis is present. Atherosclerotic calcifications are present in the popliteal artery.  IMPRESSION: Nondisplaced fracture in the proximal left fibula.  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  Atherosclerosis.   Electronically Signed   By: San Morelle M.D.   On: 10/29/2014 09:50     EKG Interpretation None      MDM   Final diagnoses:  Fall  Fracture of left proximal fibula, closed, initial encounter  UTI (lower urinary tract infection)    8:14 AM 74 y.o. female w hx of HTN, ataxia d/t cerebellar degeneration, CKD s/p nephrectomy who presents with a mechanical fall which occurred last night. She states that she does not walk at baseline and gets around with a mobile wheelchair. She states that she is able to support her weight for short periods of time and was transitioning off the toilet to her motorized chair when her knees gave out and  she fell to the ground. She denies hitting her head or loss of consciousness. She currently complains of some left knee pain. She also notes some mild suprapubic pressure. She is afebrile and vital signs are unremarkable here. She otherwise has a normal  exam. Will get screening urinalysis and imaging.  10:20 AM: Patient found to have left proximal fibula fracture. Also UTI. Her pain is controlled and I will place her in a knee immobilizer. We'll also treat the UTI with Keflex. Patient lives in an assisted nursing facility where she states she will have help. She is also nonambulatory at baseline.  I have discussed the diagnosis/risks/treatment options with the patient and believe the pt to be eligible for discharge home to follow-up with Ortho in 1 week. We also discussed returning to the ED immediately if new or worsening sx occur. We discussed the sx which are most concerning (e.g., worsening pain, fever) that necessitate immediate return. Medications administered to the patient during their visit and any new prescriptions provided to the patient are listed below.  Medications given during this visit Medications  HYDROcodone-acetaminophen (NORCO/VICODIN) 5-325 MG per tablet 1 tablet (1 tablet Oral Given 10/29/14 0827)  cephALEXin (KEFLEX) capsule 500 mg (500 mg Oral Given 10/29/14 1000)    New Prescriptions   CEPHALEXIN (KEFLEX) 500 MG CAPSULE    Take 1 capsule (500 mg total) by mouth 3 (three) times daily.   HYDROCODONE-ACETAMINOPHEN (NORCO/VICODIN) 5-325 MG PER TABLET    Take 2 tablets by mouth every 6 (six) hours as needed for moderate pain.     Pamella Pert, MD 10/29/14 1022

## 2014-10-31 LAB — URINE CULTURE
Colony Count: >=100000
Special Requests: NORMAL

## 2014-11-01 ENCOUNTER — Telehealth: Payer: Self-pay | Admitting: Emergency Medicine

## 2014-11-01 NOTE — Telephone Encounter (Signed)
Post ED Visit - Positive Culture Follow-up  Culture report reviewed by antimicrobial stewardship pharmacist: []  Wes Dulaney, Pharm.D., BCPS []  Heide Guile, Pharm.D., BCPS []  Alycia Rossetti, Pharm.D., BCPS []  [place], Pharm.D., BCPS, AAHIVP [x]  Legrand Como, Pharm.D., BCPS, AAHIVP []  Isac Sarna, Pharm.D., BCPS  Positive urine culture Treated with Keflex, organism sensitive to the same and no further patient follow-up is required at this time.  Myrna Blazer 11/01/2014, 9:36 AM

## 2014-12-05 ENCOUNTER — Other Ambulatory Visit: Payer: Self-pay | Admitting: Neurology

## 2014-12-05 ENCOUNTER — Ambulatory Visit (INDEPENDENT_AMBULATORY_CARE_PROVIDER_SITE_OTHER): Payer: Medicare Other | Admitting: Neurology

## 2014-12-05 ENCOUNTER — Encounter: Payer: Self-pay | Admitting: Neurology

## 2014-12-05 VITALS — BP 171/72 | HR 49 | Ht 64.0 in

## 2014-12-05 DIAGNOSIS — R1314 Dysphagia, pharyngoesophageal phase: Secondary | ICD-10-CM | POA: Insufficient documentation

## 2014-12-05 DIAGNOSIS — G118 Other hereditary ataxias: Secondary | ICD-10-CM

## 2014-12-05 DIAGNOSIS — Z5181 Encounter for therapeutic drug level monitoring: Secondary | ICD-10-CM | POA: Diagnosis not present

## 2014-12-05 DIAGNOSIS — R269 Unspecified abnormalities of gait and mobility: Secondary | ICD-10-CM | POA: Diagnosis not present

## 2014-12-05 HISTORY — DX: Unspecified abnormalities of gait and mobility: R26.9

## 2014-12-05 HISTORY — DX: Dysphagia, pharyngoesophageal phase: R13.14

## 2014-12-05 NOTE — Patient Instructions (Signed)

## 2014-12-05 NOTE — Progress Notes (Signed)
Reason for visit: SCA type 3  Wendy Erickson is an 74 y.o. female  History of present illness:  Wendy Erickson is a 74 year old left-handed white female with a history of SCA Type 3, with a progressive ataxia and gait disorder. The patient functionally is non-ambulatory at this point. She is living in an extended care facility. She fell in March 2016, and fractured her left knee. The patient requires assistance for most of her activities of daily living. She indicates that she is still able to feed herself. She has had improvement in her right V1 distribution trigeminal neuralgia on carbamazepine, and carbamazepine also helps the generalized itching sensations. The patient likely has a peripheral neuropathy associated with the SCA. The patient is having increasing problems with slurring her speech, and difficulty with swallowing. She is reporting frequent episodes of choking with swallowing liquids. She has a power wheelchair for mobility. She returns for an evaluation.  Past Medical History  Diagnosis Date  . Hypertension   . 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 Osteopenia   . Shingles     Right V1 distribution  . Foot fracture, right   . 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 Peripheral neuropathy   . Gait disorder   . Heart murmur   . High cholesterol   . Chronic kidney disease     left renal neoplasm  . Arthritis     "knees" (05/14/2014)  . History of gout   . Depression   . Kidney malignancy     left renal neoplasm  . Abnormality of gait 12/05/2014  . Dysphagia, pharyngoesophageal phase 12/05/2014    Past Surgical History  Procedure Laterality Date  . Tonsillectomy    . Knee arthroscopy Bilateral   . Dilation and curettage of uterus    . Laparoscopic nephrectomy  10/06/2011    Procedure: LAPAROSCOPIC NEPHRECTOMY;  Surgeon: Dutch Gray, MD;  Location: [facility];  Service: Urology;  Laterality: Left;  LEFT LAPAROSCOPIC RADICAL NEPHRECTOMY   . Back surgery    .  Lumbar disc surgery      "herniated disc"  . Vaginal hysterectomy    . Cataract extraction w/ intraocular lens implant Left     Family History  Problem Relation Age of Onset  . Ataxia Father   . Dementia Mother     Social history:  reports that she quit smoking about 9 years ago. Her smoking use included Cigarettes. She has a 40 pack-year smoking history. She has never used smokeless tobacco. She reports that she does not drink alcohol or use illicit drugs.   No Known Allergies  Medications:  Prior to Admission medications   Medication Sig Start Date End Date Taking? Authorizing Provider  acetaminophen (TYLENOL) 650 MG CR tablet Take 1,300 mg by mouth every 8 (eight) hours as needed for pain.    Yes Historical Provider, MD  aspirin 81 MG tablet Take 81 mg by mouth daily.   Yes Historical Provider, MD  atorvastatin (LIPITOR) 20 MG tablet Take 20 mg by mouth daily. 10/28/13  Yes Historical Provider, MD  Calcium Carbonate-Vitamin D (CALCARB 600/D) 600-400 MG-UNIT per tablet Take 1 tablet by mouth daily.   Yes Historical Provider, MD  carbamazepine (TEGRETOL) 200 MG tablet Take 1 tablet (200 mg total) by mouth 2 (two) times daily. 06/05/14  Yes Kathrynn Ducking, MD  chlorpheniramine (ALLERGY) 4 MG tablet Take 4 mg by mouth 2 (two) times daily as needed for allergies.   Yes Historical  Provider, MD  cholecalciferol (VITAMIN D) 1000 UNITS tablet Take 2,000 Units by mouth every morning.   Yes Historical Provider, MD  ferrous sulfate 325 (65 FE) MG EC tablet Take 325 mg by mouth daily with breakfast.   Yes Historical Provider, MD  fluocinonide (LIDEX) 0.05 % external solution Apply 1 application topically daily. 04/10/14  Yes Historical Provider, MD  FLUoxetine (PROZAC) 20 MG capsule Take 20 mg by mouth daily.   Yes Historical Provider, MD  gabapentin (NEURONTIN) 300 MG capsule Take 300 mg by mouth 2 (two) times daily.    Yes Historical Provider, MD  HYDROcodone-acetaminophen (NORCO/VICODIN) 5-325  MG per tablet Take 2 tablets by mouth every 6 (six) hours as needed for moderate pain. 10/29/14  Yes Pamella Pert, MD  Melatonin 3 MG TABS Take 3 mg by mouth at bedtime as needed (for sleep).    Yes Historical Provider, MD  metoprolol (LOPRESSOR) 50 MG tablet Take 1 tablet (50 mg total) by mouth 2 (two) times daily. 05/14/14  Yes Domenic Polite, MD  omeprazole (PRILOSEC) 20 MG capsule Take 1 capsule (20 mg total) by mouth 2 (two) times daily before a meal. 05/14/14  Yes Domenic Polite, MD  polycarbophil (FIBERCON) 625 MG tablet Take 625 mg by mouth daily.   Yes Historical Provider, MD  polyethylene glycol (MIRALAX) packet Take 17 g by mouth daily as needed for moderate constipation. 05/14/14  Yes Domenic Polite, MD  senna (SENOKOT) 8.6 MG tablet Take 1 tablet by mouth 3 (three) times daily.    Yes Historical Provider, MD  traMADol-acetaminophen (ULTRACET) 37.5-325 MG per tablet Take 1 tablet by mouth every 6 (six) hours as needed for moderate pain.  02/18/13  Yes Historical Provider, MD  zolpidem (AMBIEN) 5 MG tablet Take 5 mg by mouth at bedtime as needed for sleep.  10/28/13  Yes Historical Provider, MD    ROS:  Out of a complete 14 system review of symptoms, the patient complains only of the following symptoms, and all other reviewed systems are negative.  Decreased activity, fatigue Double vision Chest tightness Constipation Painful urination, decreased urine output Joint pain, back pain, achy muscles, muscle cramps, walking difficulty, neck stiffness Numbness, speech difficulty, weakness Depression, anxiety  Blood pressure 171/72, pulse 49, height 5\' 4"  (1.626 m).  Physical Exam  General: The patient is alert and cooperative at the time of the examination.  Skin: No significant peripheral edema is noted.   Neurologic Exam  Mental status: The patient is alert and oriented x 3 at the time of the examination. The patient has apparent normal recent and remote memory, with an  apparently normal attention span and concentration ability.   Cranial nerves: Facial symmetry is present. Speech is dysarthric, ataxic. Extraocular movements are full. Visual fields are full.  Motor: The patient has good strength in all 4 extremities.  Sensory examination: Soft touch sensation is symmetric on the face, arms, and proximal legs. Distally, the patient has decreased sensation on the legs.  Coordination: The patient has significant ataxia on arms and legs.   Gait and station: The patient is wheelchair-bound, she cannot be ambulated.  Reflexes: Deep tendon reflexes are symmetric.   Assessment/Plan:  1. SCA type 3  2. Gait disorder   3. Trigeminal neuralgia, right V1 distribution   4. Peripheral neuropathy    The patient is having increasing problems with ataxia. She is now developing dysphagia for liquids. We will get a modified barium swallow evaluation. They will try to weight the arms  to see if this can improve the ataxia. The patient will follow-up in 6 months.  Blood work will be done today to evaluate the blood count and liver functions, and carbamazepine level.    Jill Alexanders MD 12/06/2014 5:13 PM  [facility] [address] [place], [place] [address]  Phone [phone] Fax [phone]

## 2014-12-06 ENCOUNTER — Telehealth: Payer: Self-pay | Admitting: Neurology

## 2014-12-06 LAB — COMPREHENSIVE METABOLIC PANEL
ALBUMIN: 3.9 g/dL (ref 3.5–4.8)
ALK PHOS: 71 IU/L (ref 39–117)
ALT: 11 IU/L (ref 0–32)
AST: 15 IU/L (ref 0–40)
Albumin/Globulin Ratio: 1.6 (ref 1.1–2.5)
BUN / CREAT RATIO: 22 (ref 11–26)
BUN: 24 mg/dL (ref 8–27)
CHLORIDE: 104 mmol/L (ref 97–108)
CO2: 24 mmol/L (ref 18–29)
Calcium: 10 mg/dL (ref 8.7–10.3)
Creatinine, Ser: 1.08 mg/dL — ABNORMAL HIGH (ref 0.57–1.00)
GFR calc Af Amer: 60 mL/min/{1.73_m2} (ref >59)
GFR, EST NON AFRICAN AMERICAN: 52 mL/min/{1.73_m2} — AB (ref >59)
GLUCOSE: 96 mg/dL (ref 65–99)
Globulin, Total: 2.5 g/dL (ref 1.5–4.5)
POTASSIUM: 5.6 mmol/L — AB (ref 3.5–5.2)
Sodium: 147 mmol/L — ABNORMAL HIGH (ref 134–144)
Total Protein: 6.4 g/dL (ref 6.0–8.5)

## 2014-12-06 LAB — CBC WITH DIFFERENTIAL/PLATELET
Basophils Absolute: 0 10*3/uL (ref 0.0–0.2)
Basos: 1 %
EOS (ABSOLUTE): 0.1 10*3/uL (ref 0.0–0.4)
EOS: 2 %
HEMATOCRIT: 38.3 % (ref 34.0–46.6)
Hemoglobin: 12.6 g/dL (ref 11.1–15.9)
Immature Grans (Abs): 0 10*3/uL (ref 0.0–0.1)
Immature Granulocytes: 0 %
Lymphocytes Absolute: 1.9 10*3/uL (ref 0.7–3.1)
Lymphs: 31 %
MCH: 29.7 pg (ref 26.6–33.0)
MCHC: 32.9 g/dL (ref 31.5–35.7)
MCV: 90 fL (ref 79–97)
MONOCYTES: 6 %
Monocytes Absolute: 0.3 10*3/uL (ref 0.1–0.9)
NEUTROS ABS: 3.6 10*3/uL (ref 1.4–7.0)
Neutrophils: 60 %
Platelets: 294 10*3/uL (ref 150–379)
RBC: 4.24 x10E6/uL (ref 3.77–5.28)
RDW: 14.3 % (ref 12.3–15.4)
WBC: 6 10*3/uL (ref 3.4–10.8)

## 2014-12-06 LAB — CARBAMAZEPINE LEVEL, TOTAL: Carbamazepine Lvl: 7.4 ug/mL (ref 4.0–12.0)

## 2014-12-06 NOTE — Telephone Encounter (Signed)
I called the patient again. I discussed the results of the blood work with her. We will check the chemistry panel in about 3 weeks. The patient will be seen a speech therapist for a modified barium swallow.

## 2014-12-06 NOTE — Telephone Encounter (Signed)
I called patient at home and on the cell phone. Unable to leave a message. The cell phone number appears to be inactive. I will try to call back later. Blood work was unremarkable with exception that the patient has chronic stable renal insufficiency, and the sodium and potassium level was increased, would recheck blood work in 4 weeks. I will try to call back later.

## 2014-12-09 ENCOUNTER — Other Ambulatory Visit (HOSPITAL_COMMUNITY): Payer: Self-pay | Admitting: Geriatric Medicine

## 2014-12-09 DIAGNOSIS — R1310 Dysphagia, unspecified: Secondary | ICD-10-CM

## 2014-12-11 ENCOUNTER — Ambulatory Visit (HOSPITAL_COMMUNITY)
Admission: RE | Admit: 2014-12-11 | Discharge: 2014-12-11 | Disposition: A | Discharge status: Home / Self Care | Payer: Medicare Other | Source: Ambulatory Visit | Attending: Geriatric Medicine | Admitting: Geriatric Medicine

## 2014-12-11 DIAGNOSIS — R1310 Dysphagia, unspecified: Secondary | ICD-10-CM | POA: Diagnosis not present

## 2014-12-11 DIAGNOSIS — R1314 Dysphagia, pharyngoesophageal phase: Secondary | ICD-10-CM | POA: Insufficient documentation

## 2014-12-11 DIAGNOSIS — R269 Unspecified abnormalities of gait and mobility: Secondary | ICD-10-CM | POA: Insufficient documentation

## 2014-12-11 DIAGNOSIS — G118 Other hereditary ataxias: Secondary | ICD-10-CM | POA: Diagnosis not present

## 2014-12-31 ENCOUNTER — Encounter (HOSPITAL_COMMUNITY): Payer: Self-pay | Admitting: *Deleted

## 2014-12-31 ENCOUNTER — Telehealth: Payer: Self-pay | Admitting: Neurology

## 2014-12-31 ENCOUNTER — Emergency Department (HOSPITAL_COMMUNITY): Payer: Medicare Other

## 2014-12-31 ENCOUNTER — Emergency Department (HOSPITAL_COMMUNITY)
Admission: EM | Admit: 2014-12-31 | Discharge: 2014-12-31 | Disposition: A | Discharge status: Home / Self Care | Payer: Medicare Other | Attending: Emergency Medicine | Admitting: Emergency Medicine

## 2014-12-31 DIAGNOSIS — S8991XA Unspecified injury of right lower leg, initial encounter: Secondary | ICD-10-CM | POA: Insufficient documentation

## 2014-12-31 DIAGNOSIS — M1990 Unspecified osteoarthritis, unspecified site: Secondary | ICD-10-CM | POA: Diagnosis not present

## 2014-12-31 DIAGNOSIS — Z7982 Long term (current) use of aspirin: Secondary | ICD-10-CM | POA: Insufficient documentation

## 2014-12-31 DIAGNOSIS — N189 Chronic kidney disease, unspecified: Secondary | ICD-10-CM | POA: Insufficient documentation

## 2014-12-31 DIAGNOSIS — W1839XA Other fall on same level, initial encounter: Secondary | ICD-10-CM | POA: Diagnosis not present

## 2014-12-31 DIAGNOSIS — Z85528 Personal history of other malignant neoplasm of kidney: Secondary | ICD-10-CM | POA: Insufficient documentation

## 2014-12-31 DIAGNOSIS — I129 Hypertensive chronic kidney disease with stage 1 through stage 4 chronic kidney disease, or unspecified chronic kidney disease: Secondary | ICD-10-CM | POA: Insufficient documentation

## 2014-12-31 DIAGNOSIS — Y998 Other external cause status: Secondary | ICD-10-CM | POA: Diagnosis not present

## 2014-12-31 DIAGNOSIS — E780 Pure hypercholesterolemia: Secondary | ICD-10-CM | POA: Diagnosis not present

## 2014-12-31 DIAGNOSIS — Z79899 Other long term (current) drug therapy: Secondary | ICD-10-CM | POA: Diagnosis not present

## 2014-12-31 DIAGNOSIS — R011 Cardiac murmur, unspecified: Secondary | ICD-10-CM | POA: Diagnosis not present

## 2014-12-31 DIAGNOSIS — Z8669 Personal history of other diseases of the nervous system and sense organs: Secondary | ICD-10-CM | POA: Insufficient documentation

## 2014-12-31 DIAGNOSIS — Z8619 Personal history of other infectious and parasitic diseases: Secondary | ICD-10-CM | POA: Diagnosis not present

## 2014-12-31 DIAGNOSIS — M79604 Pain in right leg: Secondary | ICD-10-CM

## 2014-12-31 DIAGNOSIS — S8992XA Unspecified injury of left lower leg, initial encounter: Secondary | ICD-10-CM | POA: Insufficient documentation

## 2014-12-31 DIAGNOSIS — M79605 Pain in left leg: Secondary | ICD-10-CM

## 2014-12-31 DIAGNOSIS — Z5181 Encounter for therapeutic drug level monitoring: Secondary | ICD-10-CM

## 2014-12-31 DIAGNOSIS — F329 Major depressive disorder, single episode, unspecified: Secondary | ICD-10-CM | POA: Diagnosis not present

## 2014-12-31 DIAGNOSIS — Y9389 Activity, other specified: Secondary | ICD-10-CM | POA: Insufficient documentation

## 2014-12-31 DIAGNOSIS — Y92129 Unspecified place in nursing home as the place of occurrence of the external cause: Secondary | ICD-10-CM | POA: Diagnosis not present

## 2014-12-31 MED ORDER — MORPHINE SULFATE 4 MG/ML IJ SOLN
8.0000 mg | Freq: Once | INTRAMUSCULAR | Status: AC
  Administered 2014-12-31: 8 mg via INTRAMUSCULAR
  Filled 2014-12-31: qty 2

## 2014-12-31 NOTE — ED Notes (Addendum)
Per EMS, pt fell last night while being transferred into bed. [facility] performed x-ray of left knee, which showed a fracture of proximal fibula. Pt also complains of pain in her right hip. Pain is worse with movement. [facility] also performed hip series x-ray, which was negative.

## 2014-12-31 NOTE — Telephone Encounter (Signed)
I have called the patient to come in to get blood work done, unable to reach her initially, I will call back later.

## 2014-12-31 NOTE — Progress Notes (Signed)
CSW met with pt at bedside. There was no family present.  Patient confirms that she is from [facility]. She states that she has been living at the facility since 2011. Patient states that she fell last night. Patient states that staff from the facility were trying to assist her in the rest room during the fall. However, she states that she did not hit the floor. Due to falling patient states that her R hip and her L knee is in pain.   Patient states that she falls often. She states that she receives assistance with completing her ADL's from staff  Members.  Patient states that she does not have any questions at this time.  Willette Brace [phone] ED CSW 12/31/2014 10:57 PM

## 2014-12-31 NOTE — ED Notes (Signed)
MD at bedside. 

## 2014-12-31 NOTE — ED Notes (Signed)
Bed: IB70 Expected date:  Expected time:  Means of arrival:  Comments: EMS Knee fracture

## 2014-12-31 NOTE — ED Provider Notes (Signed)
CSN: [id number]     Arrival date & time 12/31/14  1936 History   First MD Initiated Contact with Patient 12/31/14 2046     Chief Complaint  Patient presents with  . Fall  . Knee Pain     (Consider location/radiation/quality/duration/timing/severity/associated sxs/prior Treatment) HPI Comments: Patient presents with bilateral leg pain. She was here in March and had a proximal left fibular fracture. She's had some ongoing pain related to that. She takes chronic pain medicines. She is currently residing in a nursing facility. While she was being transferred from her wheelchair to the bed she slipped and twisted her left leg. She's complained of some increased pain to that leg primarily around the knee area. She had an x-ray of the left tib-fib in the left hip done today which showed the fibular fracture but no acute injuries in the hip. She's also had some increased pain in her right leg primarily in the right tib-fib area. She denies any recent injuries to this area. She denies any back pain other than chronic back pain which is unchanged from her baseline. She has chronic numbness in her feet which is unchanged from her baseline. She is primarily nonambulatory and gets around in a wheelchair.  Patient is a 74 y.o. female presenting with fall and knee pain.  Fall Pertinent negatives include no headaches.  Knee Pain Associated symptoms: no back pain, no fever and no neck pain     Past Medical History  Diagnosis Date  . Hypertension   . 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 Osteopenia   . Shingles     Right V1 distribution  . Foot fracture, right   . 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 Peripheral neuropathy   . Gait disorder   . Heart murmur   . High cholesterol   . Chronic kidney disease     left renal neoplasm  . Arthritis     "knees" (05/14/2014)  . History of gout   . Depression   . Kidney malignancy     left renal neoplasm  . Abnormality of gait 12/05/2014   . Dysphagia, pharyngoesophageal phase 12/05/2014   Past Surgical History  Procedure Laterality Date  . Tonsillectomy    . Knee arthroscopy Bilateral   . Dilation and curettage of uterus    . Laparoscopic nephrectomy  10/06/2011    Procedure: LAPAROSCOPIC NEPHRECTOMY;  Surgeon: Dutch Gray, MD;  Location: [facility];  Service: Urology;  Laterality: Left;  LEFT LAPAROSCOPIC RADICAL NEPHRECTOMY   . Back surgery    . Lumbar disc surgery      "herniated disc"  . Vaginal hysterectomy    . Cataract extraction w/ intraocular lens implant Left    Family History  Problem Relation Age of Onset  . Ataxia Father   . Dementia Mother    History  Substance Use Topics  . Smoking status: Former Smoker -- 1.00 packs/day for 40 years    Types: Cigarettes    Quit date: 08/08/2005  . Smokeless tobacco: Never Used  . Alcohol Use: No     Comment: wine on Friday   OB History    No data available     Review of Systems  Constitutional: Negative for fever.  Gastrointestinal: Negative for nausea and vomiting.  Musculoskeletal: Positive for myalgias and arthralgias. Negative for back pain, joint swelling and neck pain.  Skin: Negative for wound.  Neurological: Negative for weakness, numbness and headaches.      Allergies  Review of patient's allergies indicates no known allergies.  Home Medications   Prior to Admission medications   Medication Sig Start Date End Date Taking? Authorizing Provider  acetaminophen (TYLENOL) 650 MG CR tablet Take 1,300 mg by mouth every 8 (eight) hours as needed for pain.    Yes Historical Provider, MD  aspirin 81 MG tablet Take 81 mg by mouth every morning.    Yes Historical Provider, MD  atorvastatin (LIPITOR) 40 MG tablet Take 40 mg by mouth every morning.   Yes Historical Provider, MD  Calcium Carbonate-Vitamin D (CALCARB 600/D) 600-400 MG-UNIT per tablet Take 1 tablet by mouth every morning.    Yes Historical Provider, MD  carbamazepine (TEGRETOL) 200 MG tablet  Take 1 tablet (200 mg total) by mouth 2 (two) times daily. 06/05/14  Yes Kathrynn Ducking, MD  chlorpheniramine (ALLERGY) 4 MG tablet Take 4 mg by mouth 2 (two) times daily as needed for allergies.   Yes Historical Provider, MD  cholecalciferol (VITAMIN D) 1000 UNITS tablet Take 2,000 Units by mouth every morning.   Yes Historical Provider, MD  ferrous sulfate 325 (65 FE) MG EC tablet Take 325 mg by mouth daily with breakfast.   Yes Historical Provider, MD  FLUoxetine (PROZAC) 10 MG capsule Take 10 mg by mouth every morning.   Yes Historical Provider, MD  gabapentin (NEURONTIN) 300 MG capsule Take 300 mg by mouth 2 (two) times daily.    Yes Historical Provider, MD  HYDROcodone-acetaminophen (NORCO/VICODIN) 5-325 MG per tablet Take 2 tablets by mouth every 6 (six) hours as needed for moderate pain. 10/29/14  Yes Pamella Pert, MD  metoprolol (LOPRESSOR) 50 MG tablet Take 1 tablet (50 mg total) by mouth 2 (two) times daily. 05/14/14  Yes Domenic Polite, MD  omeprazole (PRILOSEC) 20 MG capsule Take 1 capsule (20 mg total) by mouth 2 (two) times daily before a meal. Patient taking differently: Take 20 mg by mouth every morning.  05/14/14  Yes Domenic Polite, MD  OxyCODONE (OXYCONTIN) 10 mg T12A 12 hr tablet Take 10 mg by mouth every 12 (twelve) hours.   Yes Historical Provider, MD  polycarbophil (FIBERCON) 625 MG tablet Take 625 mg by mouth 2 (two) times daily.    Yes Historical Provider, MD  polyethylene glycol (MIRALAX) packet Take 17 g by mouth daily as needed for moderate constipation. 05/14/14  Yes Domenic Polite, MD  senna (SENOKOT) 8.6 MG tablet Take 1 tablet by mouth 2 (two) times daily.    Yes Historical Provider, MD  traMADol-acetaminophen (ULTRACET) 37.5-325 MG per tablet Take 1 tablet by mouth every 6 (six) hours as needed for moderate pain.  02/18/13  Yes Historical Provider, MD  zolpidem (AMBIEN) 5 MG tablet Take 5 mg by mouth at bedtime as needed for sleep.  10/28/13  Yes Historical Provider,  MD  atorvastatin (LIPITOR) 20 MG tablet Take 20 mg by mouth daily. 10/28/13   Historical Provider, MD   BP 196/77 mmHg  Pulse 64  Temp(Src) 98 F (36.7 C) (Oral)  Resp 18  SpO2 96% Physical Exam  Constitutional: She is oriented to person, place, and time. She appears well-developed and well-nourished.  HENT:  Head: Normocephalic and atraumatic.  Neck: Normal range of motion. Neck supple.  Cardiovascular: Normal rate.   Pulmonary/Chest: Effort normal.  Musculoskeletal: She exhibits edema and tenderness.  Patient some mild swelling of the left knee with tenderness around the knee and the proximal left fibula. There is no significant pain to the lower leg. There  is no pain on range of motion of either hip. There is no pain to the right knee. There is some tenderness over the right lower leg. There is no pain to the ankle or the feet. Pedal pulses are intact. She has some mild swelling in both legs which she says is baseline.  Neurological: She is alert and oriented to person, place, and time.  Skin: Skin is warm and dry.  Psychiatric: She has a normal mood and affect.    ED Course  Procedures (including critical care time) Labs Review Labs Reviewed - No data to display  Imaging Review Dg Tibia/fibula Right  12/31/2014   CLINICAL DATA:  Initial evaluation for acute trauma, fall, knee pain.  EXAM: RIGHT TIBIA AND FIBULA - 2 VIEW  COMPARISON:  None.  FINDINGS: No acute fracture or dislocation identified. Mild irregularity about the proximal right fibula may reflect sequela of remote trauma. The knee and ankle are in normal alignment. Moderate to advanced degenerative osteoarthrosis present about the knee. Vascular calcifications present within the upper calf. Osteopenia noted. No acute soft tissue abnormality.  IMPRESSION: No acute fracture or dislocation.   Electronically Signed   By: Jeannine Boga M.D.   On: 12/31/2014 21:46   Dg Knee Complete 4 Views Left  12/31/2014   CLINICAL  DATA:  Fall this morning while being transferred to bed at a nursing home. Anterior left knee swelling with limited movement and pain. Initial encounter. Proximal fibular fracture 2 months ago.  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 Minimally displaced proximal fibular fracture demonstrates interval healing with callus formation/bridging bone. The fracture is still partially visible. No acute fracture or dislocation is identified. No knee joint effusion is seen. Patellar spurring is noted. Vascular calcifications are noted.  IMPRESSION: 1. No acute osseous abnormality identified. 2. Healing proximal left fibular fracture. 3. 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   Electronically Signed   By: Logan Bores   On: 12/31/2014 21:46     EKG Interpretation None      MDM   Final diagnoses:  Leg pain, bilateral    X-rays are negative for acute injury. She was given a shot of morphine in the ED. She was discharged back to the nursing facility. She was advised to follow-up with Dr. Felipa Eth for ongoing pain management.    Malvin Johns, MD 12/31/14 [phone]

## 2014-12-31 NOTE — Telephone Encounter (Signed)
Pt returned Dr Jannifer Franklin phone call. I told pt Dr Jannifer Franklin had called her and would be calling her again. Please call and advise.

## 2014-12-31 NOTE — Discharge Instructions (Signed)

## 2014-12-31 NOTE — Telephone Encounter (Signed)
I called patient. The patient will come in to get a BMET done to recheck the potassium level.

## 2015-01-09 NOTE — Telephone Encounter (Signed)
I called and spoke to Wendy Erickson. She gave me their fax number. I will fax the order. She stated they would draw the lab on Monday (6/6) and fax [place] the results.

## 2015-01-09 NOTE — Telephone Encounter (Signed)
Deneise Lever with [facility] regarding labs for patient. She states that it can be done there if an order is sent over. She can be reached at [phone]. If calling today(01/09/15) can speak with RN Supervisor Sam.

## 2015-01-15 ENCOUNTER — Telehealth: Payer: Self-pay | Admitting: Neurology

## 2015-01-15 NOTE — Telephone Encounter (Signed)
Patient called wanting to follow up on her lab results from Monday 01/12/15. Please call and advise. Patient can be reached @ [phone]

## 2015-01-15 NOTE — Telephone Encounter (Signed)
I tried to call the patient, unable to leave a message. I have not yet received the blood work results.

## 2015-01-15 NOTE — Telephone Encounter (Signed)
I tried to call the patient. Unable to reach and no voicemail set up.

## 2015-01-16 NOTE — Telephone Encounter (Signed)
I received the blood work, the only thing that was done appears to be a basic metabolic profile showing a BUN of 29, creatinine of 0.95. Otherwise the study was unremarkable. A CBC, liver panel, and carbamazepine level were not received.

## 2015-01-16 NOTE — Telephone Encounter (Signed)
Patient called/returning DR. [place] call. I relayed the information. Patient stated that she will call again to have the results faxed over to [place].

## 2015-01-19 NOTE — Telephone Encounter (Signed)
Patient called requesting lab results. Please call and advise. Patient can be reached at [phone].

## 2015-01-19 NOTE — Telephone Encounter (Signed)
I spoke to the patient and relayed the results according to Dr. Jannifer Franklin' note below.

## 2015-06-09 ENCOUNTER — Ambulatory Visit (INDEPENDENT_AMBULATORY_CARE_PROVIDER_SITE_OTHER): Payer: Medicare Other | Admitting: Neurology

## 2015-06-09 ENCOUNTER — Encounter: Payer: Self-pay | Admitting: Neurology

## 2015-06-09 VITALS — BP 176/73 | HR 46

## 2015-06-09 DIAGNOSIS — R269 Unspecified abnormalities of gait and mobility: Secondary | ICD-10-CM

## 2015-06-09 DIAGNOSIS — G118 Other hereditary ataxias: Secondary | ICD-10-CM

## 2015-06-09 DIAGNOSIS — G500 Trigeminal neuralgia: Secondary | ICD-10-CM | POA: Diagnosis not present

## 2015-06-09 HISTORY — DX: Trigeminal neuralgia: G50.0

## 2015-06-09 NOTE — Progress Notes (Signed)
Reason for visit: SCA type 3  Wendy Erickson is an 74 y.o. female  History of present illness:  Wendy Erickson is a 74 year old left-handed white female with a history of spinocerebellar ataxia type III. The patient resides in an extended care facility, she requires assistance for most activities of daily living. She can feed herself, but she requires assistance with bathing and dressing. The patient has a right-sided trigeminal neuralgia, in the V1 distribution. She is on carbamazepine for this, the pain seems to be fairly well controlled. She has a peripheral neuropathy with bilateral foot drops, some discomfort in the feet. She has reported some dysphagia, she was sent to speech therapy for a modified barium swallow when last seen, no erythema was recommended for the swallowing. The patient recently has had bilateral cataract surgery. She transfers with the use of a Hoyer lift, she has not had any falls or injury since last seen. She has 2 siblings with spinocerebellar ataxia as well.  Past Medical History  Diagnosis Date  . Hypertension   . 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 Osteopenia   . Shingles     Right V1 distribution  . Foot fracture, right   . SCA-3 (spinocerebellar ataxia type 3) ([place]) 05/13/2013  . Peripheral neuropathy ([place])   . Gait disorder   . Heart murmur   . High cholesterol   . Chronic kidney disease     left renal neoplasm  . Arthritis     "knees" (05/14/2014)  . History of gout   . Depression   . Kidney malignancy ([place])     left renal neoplasm  . Abnormality of gait 12/05/2014  . Dysphagia, pharyngoesophageal phase 12/05/2014  . Trigeminal neuralgia of right side of face 06/09/2015    V1 distribution    Past Surgical History  Procedure Laterality Date  . Tonsillectomy    . Knee arthroscopy Bilateral   . Dilation and curettage of uterus    . Laparoscopic nephrectomy  10/06/2011    Procedure: LAPAROSCOPIC NEPHRECTOMY;  Surgeon: Dutch Gray, MD;   Location: [facility];  Service: Urology;  Laterality: Left;  LEFT LAPAROSCOPIC RADICAL NEPHRECTOMY   . Back surgery    . Lumbar disc surgery      "herniated disc"  . Vaginal hysterectomy    . Cataract extraction w/ intraocular lens implant Left     Family History  Problem Relation Age of Onset  . Ataxia Father   . Dementia Mother     Social history:  reports that she quit smoking about 9 years ago. Her smoking use included Cigarettes. She has a 40 pack-year smoking history. She has never used smokeless tobacco. She reports that she does not drink alcohol or use illicit drugs.   No Known Allergies  Medications:  Prior to Admission medications   Medication Sig Start Date End Date Taking? Authorizing Provider  acetaminophen (TYLENOL) 650 MG CR tablet Take 1,300 mg by mouth every 8 (eight) hours as needed for pain.    Yes Historical Provider, MD  aspirin 81 MG tablet Take 81 mg by mouth every morning.    Yes Historical Provider, MD  atorvastatin (LIPITOR) 40 MG tablet Take 40 mg by mouth every morning.   Yes Historical Provider, MD  Calcium Carbonate-Vitamin D (CALCARB 600/D) 600-400 MG-UNIT per tablet Take 1 tablet by mouth every morning.    Yes Historical Provider, MD  carbamazepine (TEGRETOL) 200 MG tablet Take 1 tablet (200 mg total) by mouth 2 (  two) times daily. 06/05/14  Yes Kathrynn Ducking, MD  chlorpheniramine (ALLERGY) 4 MG tablet Take 4 mg by mouth 2 (two) times daily as needed for allergies.   Yes Historical Provider, MD  cholecalciferol (VITAMIN D) 1000 UNITS tablet Take 2,000 Units by mouth every morning.   Yes Historical Provider, MD  Difluprednate (DUREZOL) 0.05 % EMUL Apply 1 drop to eye every 6 (six) hours.   Yes Historical Provider, MD  Doxepin HCl (SILENOR) 3 MG TABS Take 1 tablet by mouth at bedtime.   Yes Historical Provider, MD  ferrous sulfate 325 (65 FE) MG EC tablet Take 325 mg by mouth daily with breakfast.   Yes Historical Provider, MD  FLUoxetine (PROZAC) 10 MG  capsule Take 10 mg by mouth every morning.   Yes Historical Provider, MD  gabapentin (NEURONTIN) 300 MG capsule Take 300 mg by mouth 2 (two) times daily.    Yes Historical Provider, MD  HYDROcodone-acetaminophen (NORCO/VICODIN) 5-325 MG per tablet Take 2 tablets by mouth every 6 (six) hours as needed for moderate pain. 10/29/14  Yes Pamella Pert, MD  metoprolol tartrate (LOPRESSOR) 25 MG tablet Take 25 mg by mouth 2 (two) times daily.   Yes Historical Provider, MD  moxifloxacin (VIGAMOX) 0.5 % ophthalmic solution Place 1 drop into the right eye every 6 (six) hours.   Yes Historical Provider, MD  OxyCODONE (OXYCONTIN) 10 mg T12A 12 hr tablet Take 10 mg by mouth every 12 (twelve) hours.   Yes Historical Provider, MD  polycarbophil (FIBERCON) 625 MG tablet Take 625 mg by mouth 2 (two) times daily.    Yes Historical Provider, MD  polyethylene glycol (MIRALAX) packet Take 17 g by mouth daily as needed for moderate constipation. 05/14/14  Yes Domenic Polite, MD  ranitidine (ZANTAC) 75 MG tablet Take 75 mg by mouth at bedtime.   Yes Historical Provider, MD  senna (SENOKOT) 8.6 MG tablet Take 1 tablet by mouth 2 (two) times daily.    Yes Historical Provider, MD  traMADol-acetaminophen (ULTRACET) 37.5-325 MG per tablet Take 1 tablet by mouth every 6 (six) hours as needed for moderate pain.  02/18/13  Yes Historical Provider, MD    ROS:  Out of a complete 14 system review of symptoms, the patient complains only of the following symptoms, and all other reviewed systems are negative.  Decreased activity Double vision Chest tightness Leg swelling  Blood pressure 176/73, pulse 46.  Physical Exam  General: The patient is alert and cooperative at the time of the examination.  Skin: 1+ edema in the lower extremities is noted bilaterally.   Neurologic Exam  Mental status: The patient is alert and oriented x 3 at the time of the examination. The patient has apparent normal recent and remote memory,  with an apparently normal attention span and concentration ability.   Cranial nerves: Facial symmetry is present. Speech is ataxic, not aphasic. Extraocular movements are full. The patient has coarse nystagmus with horizontal gaze. Visual fields are full.  Motor: The patient has good strength in all 4 extremities.  Sensory examination: Soft touch sensation is symmetric on the face, arms, and legs. The patient has a stocking pattern pinprick sensory deficit up to the knees bilaterally.  Coordination: The patient has ataxia with finger-nose-finger and heel-to-shin bilaterally.  Gait and station: The patient could not be ambulated, she is wheelchair-bound.  Reflexes: Deep tendon reflexes are symmetric, depressed absent throughout.   Assessment/Plan:  1. Spinocerebellar ataxia, type III  2. Gait disorder  3.  Right V1 distribution trigeminal neuralgia  4. Peripheral neuropathy  The patient will continue the carbamazepine, she had blood work when last done. The patient reports some dysphagia, the recent speech therapy evaluation did not report significant swallowing issues, this will be followed over time. She will follow-up in 6-8 months, sooner if needed.  Jill Alexanders MD 06/09/2015 1:05 PM  [facility] [address] [place], [place] [address]  Phone [phone] Fax [phone]

## 2015-06-09 NOTE — Patient Instructions (Signed)
Neuropathic Pain Neuropathic pain is pain caused by damage to the nerves that are responsible for certain sensations in your body (sensory nerves). The pain can be caused by damage to:   The sensory nerves that send signals to your spinal cord and brain (peripheral nervous system).  The sensory nerves in your brain or spinal cord (central nervous system). Neuropathic pain can make you more sensitive to pain. What would be a minor sensation for most people may feel very painful if you have neuropathic pain. This is usually a long-term condition that can be difficult to treat. The type of pain can differ from person to person. It may start suddenly (acute), or it may develop slowly and last for a long time (chronic). Neuropathic pain may come and go as damaged nerves heal or may stay at the same level for years. It often causes emotional distress, loss of sleep, and a lower quality of life. CAUSES  The most common cause of damage to a sensory nerve is diabetes. Many other diseases and conditions can also cause neuropathic pain. Causes of neuropathic pain can be classified as:  Toxic. Many drugs and chemicals can cause toxic damage. The most common cause of toxic neuropathic pain is damage from drug treatment for cancer (chemotherapy).  Metabolic. This type of pain can happen when a disease causes imbalances that damage nerves. Diabetes is the most common of these diseases. Vitamin B deficiency caused by long-term alcohol abuse is another common cause.  Traumatic. Any injury that cuts, crushes, or stretches a nerve can cause damage and pain. A common example is feeling pain after losing an arm or leg (phantom limb pain).  Compression-related. If a sensory nerve gets trapped or compressed for a long period of time, the blood supply to the nerve can be cut off.  Vascular. Many blood vessel diseases can cause neuropathic pain by decreasing blood supply and oxygen to nerves.  Autoimmune. This type of  pain results from diseases in which the body's defense system mistakenly attacks sensory nerves. Examples of autoimmune diseases that can cause neuropathic pain include lupus and multiple sclerosis.  Infectious. Many types of viral infections can damage sensory nerves and cause pain. Shingles infection is a common cause of this type of pain.  Inherited. Neuropathic pain can be a symptom of many diseases that are passed down through families (genetic). SIGNS AND SYMPTOMS  The main symptom is pain. Neuropathic pain is often described as:  Burning.  Shock-like.  Stinging.  Hot or cold.  Itching. DIAGNOSIS  No single test can diagnose neuropathic pain. Your health care provider will do a physical exam and ask you about your pain. You may use a pain scale to describe how bad your pain is. You may also have tests to see if you have a high sensitivity to pain and to help find the cause and location of any sensory nerve damage. These tests may include:  Imaging studies, such as:  X-rays.  CT scan.  MRI.  Nerve conduction studies to test how well nerve signals travel through your sensory nerves (electrodiagnostic testing).  Stimulating your sensory nerves through electrodes on your skin and measuring the response in your spinal cord and brain (somatosensory evoked potentials). TREATMENT  Treatment for neuropathic pain may change over time. You may need to try different treatment options or a combination of treatments. Some options include:  Over-the-counter pain relievers.  Prescription medicines. Some medicines used to treat other conditions may also help neuropathic pain. These   include medicines to:  Control seizures (anticonvulsants).  Relieve depression (antidepressants).  Prescription-strength pain relievers (narcotics). These are usually used when other pain relievers do not help.  Transcutaneous nerve stimulation (TENS). This uses electrical currents to block painful nerve  signals. The treatment is painless.  Topical and local anesthetics. These are medicines that numb the nerves. They can be injected as a nerve block or applied to the skin.  Alternative treatments, such as:  Acupuncture.  Meditation.  Massage.  Physical therapy.  Pain management programs.  Counseling. HOME CARE INSTRUCTIONS  Learn as much as you can about your condition.  Take medicines only as directed by your health care provider.  Work closely with all your health care providers to find what works best for you.  Have a good support system at home.  Consider joining a chronic pain support group. SEEK MEDICAL CARE IF:  Your pain treatments are not helping.  You are having side effects from your medicines.  You are struggling with fatigue, mood changes, depression, or anxiety.   This information is not intended to replace advice given to you by your health care provider. Make sure you discuss any questions you have with your health care provider.   Document Released: 04/21/2004 Document Revised: 08/15/2014 Document Reviewed: 01/02/2014 Elsevier Interactive Patient Education 2016 [employer]  

## 2015-09-19 DIAGNOSIS — F329 Major depressive disorder, single episode, unspecified: Secondary | ICD-10-CM | POA: Diagnosis not present

## 2015-09-19 DIAGNOSIS — R569 Unspecified convulsions: Secondary | ICD-10-CM | POA: Diagnosis not present

## 2015-09-21 DIAGNOSIS — I10 Essential (primary) hypertension: Secondary | ICD-10-CM | POA: Diagnosis not present

## 2015-09-21 DIAGNOSIS — G500 Trigeminal neuralgia: Secondary | ICD-10-CM | POA: Diagnosis not present

## 2015-09-21 DIAGNOSIS — F329 Major depressive disorder, single episode, unspecified: Secondary | ICD-10-CM | POA: Diagnosis not present

## 2015-09-21 DIAGNOSIS — G119 Hereditary ataxia, unspecified: Secondary | ICD-10-CM | POA: Diagnosis not present

## 2015-09-22 DIAGNOSIS — D649 Anemia, unspecified: Secondary | ICD-10-CM | POA: Diagnosis not present

## 2015-10-07 DIAGNOSIS — N390 Urinary tract infection, site not specified: Secondary | ICD-10-CM | POA: Diagnosis not present

## 2015-10-07 DIAGNOSIS — R300 Dysuria: Secondary | ICD-10-CM | POA: Diagnosis not present

## 2015-10-22 DIAGNOSIS — H6123 Impacted cerumen, bilateral: Secondary | ICD-10-CM | POA: Diagnosis not present

## 2015-10-22 DIAGNOSIS — G119 Hereditary ataxia, unspecified: Secondary | ICD-10-CM | POA: Diagnosis not present

## 2015-10-22 DIAGNOSIS — K5900 Constipation, unspecified: Secondary | ICD-10-CM | POA: Diagnosis not present

## 2015-10-22 DIAGNOSIS — E7800 Pure hypercholesterolemia, unspecified: Secondary | ICD-10-CM | POA: Diagnosis not present

## 2015-10-22 DIAGNOSIS — I10 Essential (primary) hypertension: Secondary | ICD-10-CM | POA: Diagnosis not present

## 2015-10-29 DIAGNOSIS — H6122 Impacted cerumen, left ear: Secondary | ICD-10-CM | POA: Diagnosis not present

## 2015-12-01 DIAGNOSIS — M6281 Muscle weakness (generalized): Secondary | ICD-10-CM | POA: Diagnosis not present

## 2015-12-01 DIAGNOSIS — R270 Ataxia, unspecified: Secondary | ICD-10-CM | POA: Diagnosis not present

## 2015-12-01 DIAGNOSIS — G3281 Cerebellar ataxia in diseases classified elsewhere: Secondary | ICD-10-CM | POA: Diagnosis not present

## 2015-12-07 ENCOUNTER — Ambulatory Visit: Payer: Medicare Other | Admitting: Adult Health

## 2015-12-08 DIAGNOSIS — M6281 Muscle weakness (generalized): Secondary | ICD-10-CM | POA: Diagnosis not present

## 2015-12-08 DIAGNOSIS — R270 Ataxia, unspecified: Secondary | ICD-10-CM | POA: Diagnosis not present

## 2015-12-08 DIAGNOSIS — G3281 Cerebellar ataxia in diseases classified elsewhere: Secondary | ICD-10-CM | POA: Diagnosis not present

## 2015-12-10 ENCOUNTER — Ambulatory Visit: Payer: Medicare Other | Admitting: Adult Health

## 2015-12-14 ENCOUNTER — Encounter: Payer: Self-pay | Admitting: Adult Health

## 2015-12-14 DIAGNOSIS — F325 Major depressive disorder, single episode, in full remission: Secondary | ICD-10-CM | POA: Diagnosis not present

## 2015-12-14 DIAGNOSIS — G119 Hereditary ataxia, unspecified: Secondary | ICD-10-CM | POA: Diagnosis not present

## 2015-12-14 DIAGNOSIS — I10 Essential (primary) hypertension: Secondary | ICD-10-CM | POA: Diagnosis not present

## 2015-12-14 DIAGNOSIS — Z85528 Personal history of other malignant neoplasm of kidney: Secondary | ICD-10-CM | POA: Diagnosis not present

## 2015-12-14 DIAGNOSIS — R300 Dysuria: Secondary | ICD-10-CM | POA: Diagnosis not present

## 2015-12-15 DIAGNOSIS — N390 Urinary tract infection, site not specified: Secondary | ICD-10-CM | POA: Diagnosis not present

## 2015-12-15 DIAGNOSIS — R319 Hematuria, unspecified: Secondary | ICD-10-CM | POA: Diagnosis not present

## 2015-12-15 DIAGNOSIS — Z79899 Other long term (current) drug therapy: Secondary | ICD-10-CM | POA: Diagnosis not present

## 2015-12-16 ENCOUNTER — Telehealth: Payer: Self-pay | Admitting: Adult Health

## 2015-12-16 NOTE — Telephone Encounter (Signed)
Pt called said she rec'd the letter but she did come to her appointment. Said she was not seen because she did not have co-pay. Pt said she was insulted that she got the letter and she was insulted at the clinic on the day that she could not be seen on 12/10/15. Pt said she doesn't think she should have to see a doctor unless she has a problem.  Operator relayed that she has to be seen yearly in order for her to continue to get meds. Said she sees a Tax adviser at [facility] where she lives and she could see him. Operator relayed that she could chose to see that doctor and the message would be relayed to Dr Jannifer Franklin and Jinny Blossom. Pt said she does not remember ever making an appointment to see [place].

## 2015-12-16 NOTE — Telephone Encounter (Signed)
I called patient. The letter that she got regarding not showing up for the appointment was probably sent in error. The patient must bring her co-pay nor be seen, I believe that she understands this issue.

## 2015-12-23 ENCOUNTER — Other Ambulatory Visit (HOSPITAL_COMMUNITY): Payer: Self-pay | Admitting: Urology

## 2015-12-23 ENCOUNTER — Ambulatory Visit (HOSPITAL_COMMUNITY)
Admission: RE | Admit: 2015-12-23 | Discharge: 2015-12-23 | Disposition: A | Discharge status: Home / Self Care | Payer: Medicare Other | Source: Ambulatory Visit | Attending: Urology | Admitting: Urology

## 2015-12-23 DIAGNOSIS — C642 Malignant neoplasm of left kidney, except renal pelvis: Secondary | ICD-10-CM

## 2015-12-23 DIAGNOSIS — C649 Malignant neoplasm of unspecified kidney, except renal pelvis: Secondary | ICD-10-CM | POA: Insufficient documentation

## 2015-12-23 DIAGNOSIS — Q6211 Congenital occlusion of ureteropelvic junction: Secondary | ICD-10-CM | POA: Diagnosis not present

## 2015-12-23 DIAGNOSIS — R0602 Shortness of breath: Secondary | ICD-10-CM | POA: Diagnosis not present

## 2016-02-03 DIAGNOSIS — M6281 Muscle weakness (generalized): Secondary | ICD-10-CM | POA: Diagnosis not present

## 2016-02-03 DIAGNOSIS — R270 Ataxia, unspecified: Secondary | ICD-10-CM | POA: Diagnosis not present

## 2016-02-03 DIAGNOSIS — K219 Gastro-esophageal reflux disease without esophagitis: Secondary | ICD-10-CM | POA: Diagnosis not present

## 2016-02-03 DIAGNOSIS — G3281 Cerebellar ataxia in diseases classified elsewhere: Secondary | ICD-10-CM | POA: Diagnosis not present

## 2016-02-10 DIAGNOSIS — M6281 Muscle weakness (generalized): Secondary | ICD-10-CM | POA: Diagnosis not present

## 2016-02-10 DIAGNOSIS — R270 Ataxia, unspecified: Secondary | ICD-10-CM | POA: Diagnosis not present

## 2016-02-10 DIAGNOSIS — K219 Gastro-esophageal reflux disease without esophagitis: Secondary | ICD-10-CM | POA: Diagnosis not present

## 2016-02-10 DIAGNOSIS — G3281 Cerebellar ataxia in diseases classified elsewhere: Secondary | ICD-10-CM | POA: Diagnosis not present

## 2016-02-21 DIAGNOSIS — F325 Major depressive disorder, single episode, in full remission: Secondary | ICD-10-CM | POA: Diagnosis not present

## 2016-02-21 DIAGNOSIS — C649 Malignant neoplasm of unspecified kidney, except renal pelvis: Secondary | ICD-10-CM | POA: Diagnosis not present

## 2016-02-21 DIAGNOSIS — G119 Hereditary ataxia, unspecified: Secondary | ICD-10-CM | POA: Diagnosis not present

## 2016-02-21 DIAGNOSIS — I129 Hypertensive chronic kidney disease with stage 1 through stage 4 chronic kidney disease, or unspecified chronic kidney disease: Secondary | ICD-10-CM | POA: Diagnosis not present

## 2016-02-21 DIAGNOSIS — N183 Chronic kidney disease, stage 3 (moderate): Secondary | ICD-10-CM | POA: Diagnosis not present

## 2016-02-21 DIAGNOSIS — R300 Dysuria: Secondary | ICD-10-CM | POA: Diagnosis not present

## 2016-03-14 DIAGNOSIS — Z5181 Encounter for therapeutic drug level monitoring: Secondary | ICD-10-CM | POA: Diagnosis not present

## 2016-03-14 DIAGNOSIS — R569 Unspecified convulsions: Secondary | ICD-10-CM | POA: Diagnosis not present

## 2016-03-22 DIAGNOSIS — M542 Cervicalgia: Secondary | ICD-10-CM | POA: Diagnosis not present

## 2016-03-22 DIAGNOSIS — J069 Acute upper respiratory infection, unspecified: Secondary | ICD-10-CM | POA: Diagnosis not present

## 2016-03-27 DIAGNOSIS — M25532 Pain in left wrist: Secondary | ICD-10-CM | POA: Diagnosis not present

## 2016-03-31 DIAGNOSIS — M79642 Pain in left hand: Secondary | ICD-10-CM | POA: Diagnosis not present

## 2016-05-13 DIAGNOSIS — I10 Essential (primary) hypertension: Secondary | ICD-10-CM | POA: Diagnosis not present

## 2016-05-25 ENCOUNTER — Encounter (HOSPITAL_COMMUNITY): Payer: Self-pay | Admitting: Pharmacy Technician

## 2016-05-25 ENCOUNTER — Inpatient Hospital Stay (HOSPITAL_COMMUNITY)
Admission: EM | Admit: 2016-05-25 | Discharge: 2016-05-27 | DRG: 084 | Disposition: A | Discharge status: Skilled Nursing Facility | Payer: Medicare Other | Attending: Internal Medicine | Admitting: Internal Medicine

## 2016-05-25 ENCOUNTER — Emergency Department (HOSPITAL_COMMUNITY): Payer: Medicare Other

## 2016-05-25 ENCOUNTER — Observation Stay (HOSPITAL_COMMUNITY): Payer: Medicare Other

## 2016-05-25 DIAGNOSIS — S065X9A Traumatic subdural hemorrhage with loss of consciousness of unspecified duration, initial encounter: Secondary | ICD-10-CM | POA: Diagnosis present

## 2016-05-25 DIAGNOSIS — Z905 Acquired absence of kidney: Secondary | ICD-10-CM

## 2016-05-25 DIAGNOSIS — Y92129 Unspecified place in nursing home as the place of occurrence of the external cause: Secondary | ICD-10-CM

## 2016-05-25 DIAGNOSIS — I129 Hypertensive chronic kidney disease with stage 1 through stage 4 chronic kidney disease, or unspecified chronic kidney disease: Secondary | ICD-10-CM | POA: Diagnosis present

## 2016-05-25 DIAGNOSIS — R51 Headache: Secondary | ICD-10-CM | POA: Diagnosis not present

## 2016-05-25 DIAGNOSIS — S0181XA Laceration without foreign body of other part of head, initial encounter: Secondary | ICD-10-CM | POA: Diagnosis not present

## 2016-05-25 DIAGNOSIS — N189 Chronic kidney disease, unspecified: Secondary | ICD-10-CM | POA: Diagnosis not present

## 2016-05-25 DIAGNOSIS — G629 Polyneuropathy, unspecified: Secondary | ICD-10-CM | POA: Diagnosis not present

## 2016-05-25 DIAGNOSIS — E785 Hyperlipidemia, unspecified: Secondary | ICD-10-CM | POA: Diagnosis present

## 2016-05-25 DIAGNOSIS — I10 Essential (primary) hypertension: Secondary | ICD-10-CM | POA: Diagnosis not present

## 2016-05-25 DIAGNOSIS — S01111A Laceration without foreign body of right eyelid and periocular area, initial encounter: Secondary | ICD-10-CM | POA: Diagnosis not present

## 2016-05-25 DIAGNOSIS — F329 Major depressive disorder, single episode, unspecified: Secondary | ICD-10-CM | POA: Diagnosis present

## 2016-05-25 DIAGNOSIS — M8580 Other specified disorders of bone density and structure, unspecified site: Secondary | ICD-10-CM | POA: Diagnosis present

## 2016-05-25 DIAGNOSIS — Z79899 Other long term (current) drug therapy: Secondary | ICD-10-CM

## 2016-05-25 DIAGNOSIS — I609 Nontraumatic subarachnoid hemorrhage, unspecified: Secondary | ICD-10-CM

## 2016-05-25 DIAGNOSIS — Z87891 Personal history of nicotine dependence: Secondary | ICD-10-CM

## 2016-05-25 DIAGNOSIS — E860 Dehydration: Secondary | ICD-10-CM | POA: Diagnosis present

## 2016-05-25 DIAGNOSIS — S066X9A Traumatic subarachnoid hemorrhage with loss of consciousness of unspecified duration, initial encounter: Secondary | ICD-10-CM | POA: Diagnosis not present

## 2016-05-25 DIAGNOSIS — S066X0A Traumatic subarachnoid hemorrhage without loss of consciousness, initial encounter: Secondary | ICD-10-CM | POA: Diagnosis not present

## 2016-05-25 DIAGNOSIS — W19XXXA Unspecified fall, initial encounter: Secondary | ICD-10-CM

## 2016-05-25 DIAGNOSIS — Z85528 Personal history of other malignant neoplasm of kidney: Secondary | ICD-10-CM | POA: Diagnosis not present

## 2016-05-25 DIAGNOSIS — S299XXA Unspecified injury of thorax, initial encounter: Secondary | ICD-10-CM | POA: Diagnosis not present

## 2016-05-25 DIAGNOSIS — Z993 Dependence on wheelchair: Secondary | ICD-10-CM

## 2016-05-25 DIAGNOSIS — R270 Ataxia, unspecified: Secondary | ICD-10-CM | POA: Diagnosis present

## 2016-05-25 DIAGNOSIS — Z66 Do not resuscitate: Secondary | ICD-10-CM | POA: Diagnosis present

## 2016-05-25 DIAGNOSIS — Z7982 Long term (current) use of aspirin: Secondary | ICD-10-CM

## 2016-05-25 DIAGNOSIS — W050XXA Fall from non-moving wheelchair, initial encounter: Secondary | ICD-10-CM | POA: Diagnosis present

## 2016-05-25 DIAGNOSIS — I6200 Nontraumatic subdural hemorrhage, unspecified: Secondary | ICD-10-CM

## 2016-05-25 DIAGNOSIS — S199XXA Unspecified injury of neck, initial encounter: Secondary | ICD-10-CM | POA: Diagnosis not present

## 2016-05-25 DIAGNOSIS — S065XAA Traumatic subdural hemorrhage with loss of consciousness status unknown, initial encounter: Secondary | ICD-10-CM

## 2016-05-25 DIAGNOSIS — S098XXA Other specified injuries of head, initial encounter: Secondary | ICD-10-CM | POA: Diagnosis not present

## 2016-05-25 LAB — CBC WITH DIFFERENTIAL/PLATELET
BASOS ABS: 0 10*3/uL (ref 0.0–0.1)
BASOS PCT: 0 %
EOS ABS: 0.1 10*3/uL (ref 0.0–0.7)
EOS PCT: 1 %
HCT: 41.2 % (ref 36.0–46.0)
Hemoglobin: 13.4 g/dL (ref 12.0–15.0)
Lymphocytes Relative: 11 %
Lymphs Abs: 1.5 10*3/uL (ref 0.7–4.0)
MCH: 29.8 pg (ref 26.0–34.0)
MCHC: 32.5 g/dL (ref 30.0–36.0)
MCV: 91.6 fL (ref 78.0–100.0)
MONO ABS: 0.4 10*3/uL (ref 0.1–1.0)
Monocytes Relative: 3 %
NEUTROS PCT: 85 %
Neutro Abs: 12 10*3/uL — ABNORMAL HIGH (ref 1.7–7.7)
PLATELETS: 324 10*3/uL (ref 150–400)
RBC: 4.5 MIL/uL (ref 3.87–5.11)
RDW: 14.5 % (ref 11.5–15.5)
WBC: 14 10*3/uL — AB (ref 4.0–10.5)

## 2016-05-25 LAB — BASIC METABOLIC PANEL
ANION GAP: 10 (ref 5–15)
BUN: 27 mg/dL — ABNORMAL HIGH (ref 6–20)
CALCIUM: 9.9 mg/dL (ref 8.9–10.3)
CO2: 29 mmol/L (ref 22–32)
Chloride: 100 mmol/L — ABNORMAL LOW (ref 101–111)
Creatinine, Ser: 0.9 mg/dL (ref 0.44–1.00)
GLUCOSE: 91 mg/dL (ref 65–99)
Potassium: 4.2 mmol/L (ref 3.5–5.1)
SODIUM: 139 mmol/L (ref 135–145)

## 2016-05-25 LAB — MRSA PCR SCREENING: MRSA BY PCR: NEGATIVE

## 2016-05-25 LAB — PROTIME-INR
INR: 0.98
Prothrombin Time: 13 seconds (ref 11.4–15.2)

## 2016-05-25 MED ORDER — ACETAMINOPHEN 325 MG PO TABS: 650.0000 mg | ORAL_TABLET | Freq: Four times a day (QID) | ORAL | Status: DC | PRN

## 2016-05-25 MED ORDER — ACETAMINOPHEN 650 MG RE SUPP: 650.0000 mg | Freq: Four times a day (QID) | RECTAL | Status: DC | PRN

## 2016-05-25 MED ORDER — LIDOCAINE HCL 2 % IJ SOLN
INTRAMUSCULAR | Status: AC
  Filled 2016-05-25: qty 20

## 2016-05-25 MED ORDER — HYDRALAZINE HCL 20 MG/ML IJ SOLN
10.0000 mg | Freq: Once | INTRAMUSCULAR | Status: AC
  Administered 2016-05-25: 10 mg via INTRAVENOUS
  Filled 2016-05-25: qty 1

## 2016-05-25 MED ORDER — TRAMADOL-ACETAMINOPHEN 37.5-325 MG PO TABS
1.0000 | ORAL_TABLET | Freq: Two times a day (BID) | ORAL | Status: DC
  Administered 2016-05-25 – 2016-05-27 (×4): 1 via ORAL
  Filled 2016-05-25 (×4): qty 1

## 2016-05-25 MED ORDER — ATORVASTATIN CALCIUM 40 MG PO TABS
40.0000 mg | ORAL_TABLET | Freq: Every day | ORAL | Status: DC
  Administered 2016-05-25 – 2016-05-26 (×2): 40 mg via ORAL
  Filled 2016-05-25 (×2): qty 1

## 2016-05-25 MED ORDER — AMLODIPINE BESYLATE 5 MG PO TABS
2.5000 mg | ORAL_TABLET | Freq: Every day | ORAL | Status: DC
  Administered 2016-05-26 – 2016-05-27 (×2): 2.5 mg via ORAL
  Filled 2016-05-25 (×2): qty 1

## 2016-05-25 MED ORDER — SENNA 8.6 MG PO TABS
1.0000 | ORAL_TABLET | Freq: Two times a day (BID) | ORAL | Status: DC
  Administered 2016-05-25 – 2016-05-27 (×4): 8.6 mg via ORAL
  Filled 2016-05-25 (×4): qty 1

## 2016-05-25 MED ORDER — GABAPENTIN 300 MG PO CAPS
300.0000 mg | ORAL_CAPSULE | Freq: Two times a day (BID) | ORAL | Status: DC
  Administered 2016-05-25 – 2016-05-27 (×4): 300 mg via ORAL
  Filled 2016-05-25 (×4): qty 1

## 2016-05-25 MED ORDER — LIDOCAINE-EPINEPHRINE (PF) 2 %-1:200000 IJ SOLN: 20.0000 mL | Freq: Once | INTRAMUSCULAR | Status: DC

## 2016-05-25 MED ORDER — LIDOCAINE HCL 2 % IJ SOLN
20.0000 mL | Freq: Once | INTRAMUSCULAR | Status: AC
  Administered 2016-05-25: 400 mg

## 2016-05-25 MED ORDER — ONDANSETRON HCL 4 MG PO TABS: 4.0000 mg | ORAL_TABLET | Freq: Four times a day (QID) | ORAL | Status: DC | PRN

## 2016-05-25 MED ORDER — SODIUM CHLORIDE 0.9% FLUSH
3.0000 mL | Freq: Two times a day (BID) | INTRAVENOUS | Status: DC
  Administered 2016-05-26: 3 mL via INTRAVENOUS

## 2016-05-25 MED ORDER — LIDOCAINE-EPINEPHRINE (PF) 1 %-1:200000 IJ SOLN
INTRAMUSCULAR | Status: AC
  Filled 2016-05-25: qty 30

## 2016-05-25 MED ORDER — SODIUM CHLORIDE 0.9 % IV SOLN: 250.0000 mL | INTRAVENOUS | Status: DC | PRN

## 2016-05-25 MED ORDER — FLUOXETINE HCL 20 MG PO CAPS
20.0000 mg | ORAL_CAPSULE | Freq: Every day | ORAL | Status: DC
  Administered 2016-05-26 – 2016-05-27 (×2): 20 mg via ORAL
  Filled 2016-05-25 (×2): qty 1

## 2016-05-25 MED ORDER — ACETAMINOPHEN 500 MG PO TABS
1000.0000 mg | ORAL_TABLET | Freq: Once | ORAL | Status: AC
  Administered 2016-05-25: 1000 mg via ORAL
  Filled 2016-05-25: qty 2

## 2016-05-25 MED ORDER — SODIUM CHLORIDE 0.9% FLUSH: 3.0000 mL | INTRAVENOUS | Status: DC | PRN

## 2016-05-25 MED ORDER — METOPROLOL TARTRATE 25 MG PO TABS
25.0000 mg | ORAL_TABLET | Freq: Two times a day (BID) | ORAL | Status: DC
  Administered 2016-05-25 – 2016-05-26 (×2): 25 mg via ORAL
  Filled 2016-05-25 (×2): qty 1

## 2016-05-25 MED ORDER — SODIUM CHLORIDE 0.9% FLUSH
3.0000 mL | Freq: Two times a day (BID) | INTRAVENOUS | Status: DC
  Administered 2016-05-25 – 2016-05-27 (×3): 3 mL via INTRAVENOUS

## 2016-05-25 MED ORDER — POLYETHYLENE GLYCOL 3350 17 G PO PACK: 17.0000 g | PACK | Freq: Every day | ORAL | Status: DC | PRN

## 2016-05-25 MED ORDER — CARBAMAZEPINE 200 MG PO TABS
200.0000 mg | ORAL_TABLET | Freq: Two times a day (BID) | ORAL | Status: DC
  Administered 2016-05-25 – 2016-05-27 (×4): 200 mg via ORAL
  Filled 2016-05-25 (×5): qty 1

## 2016-05-25 MED ORDER — SODIUM CHLORIDE 0.9 % IV SOLN
INTRAVENOUS | Status: DC
  Administered 2016-05-25: 23:00:00 via INTRAVENOUS

## 2016-05-25 MED ORDER — ONDANSETRON HCL 4 MG/2ML IJ SOLN
4.0000 mg | Freq: Four times a day (QID) | INTRAMUSCULAR | Status: DC | PRN
  Administered 2016-05-25: 4 mg via INTRAVENOUS
  Filled 2016-05-25: qty 2

## 2016-05-25 MED ORDER — HYDROCODONE-ACETAMINOPHEN 5-325 MG PO TABS
2.0000 | ORAL_TABLET | Freq: Four times a day (QID) | ORAL | Status: DC | PRN
  Administered 2016-05-25 – 2016-05-27 (×4): 2 via ORAL
  Filled 2016-05-25 (×4): qty 2

## 2016-05-25 NOTE — ED Triage Notes (Signed)
Pt brought in by EMS from [facility] for witnessed fall. Hit head on corner of bedside table and has a facial Lac. Bleeding controlled. Pt not on blood thinners. Denies neck or back pain. 180/90 BP, HR 60, 94% RA. Denies LOC.

## 2016-05-25 NOTE — ED Notes (Signed)
20 minute timer called to ICU Charge RN.

## 2016-05-25 NOTE — ED Notes (Signed)
Pt o2 sats were again in the upper 80's on RA. Again pt was placed on 2L [place] and o2 sat improved to 94%. Will cont to monitor.

## 2016-05-25 NOTE — Consult Note (Signed)
  Called about ms Fearn. Spoke with the PA about her history. Review the ct head. Small traumatic subarachnoid hemorrhage. Neuro stable. No need for surgical intervention but observation with ct to be repeated in 24 h or before prn. I will continue to be on call for the next 14 hours

## 2016-05-25 NOTE — ED Provider Notes (Signed)
[facility] DEPT Provider Note   CSN: [id number] Arrival date & time: 05/25/16  1154     History   Chief Complaint Chief Complaint  Patient presents with  . Fall    HPI Wendy Erickson is a 75 y.o. female.  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 She reports that she fell striking her right face on the bedside table earlier this morning. She complains of headache. She denies taking any blood thinners. She denies any chest pain or shortness of breath. She denies any pain in her abdomen or hips. She has not tried anything for her symptoms. She reports having intermittent numbness in her face and lower extremities. DNR ticket with patient.      Past Medical History:  Diagnosis Date  . Abnormality of gait 12/05/2014  . Arthritis    "knees" (05/14/2014)  . 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 Chronic kidney disease    left renal neoplasm  . Depression   . Dysphagia, pharyngoesophageal phase 12/05/2014  . Foot fracture, right   . Gait disorder   . Heart murmur   . High cholesterol   . History of gout   . Hypertension   . 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 Osteopenia   . Peripheral neuropathy ([place])   . 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 Shingles    Right V1 distribution  . Trigeminal neuralgia of right side of face 06/09/2015   V1 distribution    Patient Active Problem List   Diagnosis Date Noted  . Trigeminal neuralgia of right side of face 06/09/2015  . Abnormality of gait 12/05/2014  . Dysphagia, pharyngoesophageal phase 12/05/2014  . Cholelithiasis 05/14/2014  . Abdominal pain, epigastric 05/14/2014  . Systolic murmur 0000000  . Chest pain 05/11/2014  . Atypical chest pain 05/11/2014  . 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 Hydronephrosis of right kidney 04/13/2013  . Absent kidney, acquired 04/13/2013  . Pyelonephritis  04/11/2013  . AKI (acute kidney injury) ([place]) 04/11/2013  . HTN (hypertension) 04/11/2013    Past Surgical History:  Procedure Laterality Date  . BACK SURGERY    . CATARACT EXTRACTION W/ INTRAOCULAR LENS IMPLANT Left   . DILATION AND CURETTAGE OF UTERUS    . KNEE ARTHROSCOPY Bilateral   . LAPAROSCOPIC NEPHRECTOMY  10/06/2011   Procedure: LAPAROSCOPIC NEPHRECTOMY;  Surgeon: Dutch Gray, MD;  Location: [facility] ORS;  Service: Urology;  Laterality: Left;  LEFT LAPAROSCOPIC RADICAL NEPHRECTOMY   . LUMBAR DISC SURGERY     "herniated disc"  . TONSILLECTOMY    . VAGINAL HYSTERECTOMY      OB History    No data available       Home Medications    Prior to Admission medications   Medication Sig Start Date End Date Taking? Authorizing Provider  acetaminophen (TYLENOL) 650 MG CR tablet Take 1,300 mg by mouth every 8 (eight) hours as needed for pain.     Historical Provider, MD  aspirin 81 MG tablet Take 81 mg by mouth every morning.     Historical Provider, MD  atorvastatin (LIPITOR) 40 MG tablet Take 40 mg by mouth every morning.    Historical Provider, MD  Calcium Carbonate-Vitamin D (CALCARB 600/D) 600-400 MG-UNIT per tablet Take 1 tablet by mouth every morning.     Historical Provider, MD  carbamazepine (TEGRETOL) 200 MG tablet Take 1 tablet (200 mg total)  by mouth 2 (two) times daily. 06/05/14   Kathrynn Ducking, MD  chlorpheniramine (ALLERGY) 4 MG tablet Take 4 mg by mouth 2 (two) times daily as needed for allergies.    Historical Provider, MD  cholecalciferol (VITAMIN D) 1000 UNITS tablet Take 2,000 Units by mouth every morning.    Historical Provider, MD  Difluprednate (DUREZOL) 0.05 % EMUL Apply 1 drop to eye every 6 (six) hours.    Historical Provider, MD  Doxepin HCl (SILENOR) 3 MG TABS Take 1 tablet by mouth at bedtime.    Historical Provider, MD  ferrous sulfate 325 (65 FE) MG EC tablet Take 325 mg by mouth daily with breakfast.    Historical Provider, MD  FLUoxetine (PROZAC) 10 MG  capsule Take 10 mg by mouth every morning.    Historical Provider, MD  gabapentin (NEURONTIN) 300 MG capsule Take 300 mg by mouth 2 (two) times daily.     Historical Provider, MD  HYDROcodone-acetaminophen (NORCO/VICODIN) 5-325 MG per tablet Take 2 tablets by mouth every 6 (six) hours as needed for moderate pain. 10/29/14   Pamella Pert, MD  metoprolol tartrate (LOPRESSOR) 25 MG tablet Take 25 mg by mouth 2 (two) times daily.    Historical Provider, MD  moxifloxacin (VIGAMOX) 0.5 % ophthalmic solution Place 1 drop into the right eye every 6 (six) hours.    Historical Provider, MD  OxyCODONE (OXYCONTIN) 10 mg T12A 12 hr tablet Take 10 mg by mouth every 12 (twelve) hours.    Historical Provider, MD  polycarbophil (FIBERCON) 625 MG tablet Take 625 mg by mouth 2 (two) times daily.     Historical Provider, MD  polyethylene glycol (MIRALAX) packet Take 17 g by mouth daily as needed for moderate constipation. 05/14/14   Domenic Polite, MD  ranitidine (ZANTAC) 75 MG tablet Take 75 mg by mouth at bedtime.    Historical Provider, MD  senna (SENOKOT) 8.6 MG tablet Take 1 tablet by mouth 2 (two) times daily.     Historical Provider, MD  traMADol-acetaminophen (ULTRACET) 37.5-325 MG per tablet Take 1 tablet by mouth every 6 (six) hours as needed for moderate pain.  02/18/13   Historical Provider, MD    Family History Family History  Problem Relation Age of Onset  . Ataxia Father   . Dementia Mother     Social History Social History  Substance Use Topics  . Smoking status: Former Smoker    Packs/day: 1.00    Years: 40.00    Types: Cigarettes    Quit date: 08/08/2005  . Smokeless tobacco: Never Used  . Alcohol use No     Comment: wine on Friday     Allergies   Review of patient's allergies indicates no known allergies.   Review of Systems Review of Systems  All other systems reviewed and are negative.    Physical Exam Updated Vital Signs BP (!) 177/111 (BP Location: Left Arm)   Pulse  (!) 55   Temp 97.6 F (36.4 C) (Oral)   Resp 18   Ht 5\' 3"  (1.6 m)   Wt 72.6 kg   SpO2 98% Comment: 2L [place]  BMI 28.34 kg/m   Physical Exam  Constitutional: She is oriented to person, place, and time. She appears well-developed and well-nourished.  HENT:  Head: Normocephalic and atraumatic.  4 cm gaping laceration over right eyebrow, bleeding controlled with gauze, no evidence of foreign body  Eyes: Conjunctivae and EOM are normal. Pupils are equal, round, and reactive to light.  Neck: Normal range of motion. Neck supple.  Cardiovascular: Normal rate and regular rhythm.  Exam reveals no gallop and no friction rub.   No murmur heard. Pulmonary/Chest: Effort normal and breath sounds normal. No respiratory distress. She has no wheezes. She has no rales. She exhibits no tenderness.  Abdominal: Soft. Bowel sounds are normal. She exhibits no distension and no mass. There is no tenderness. There is no rebound and no guarding.  Musculoskeletal: Normal range of motion. She exhibits no edema or tenderness.  No bony abnormality or deformity with palpation of all extremities, passive range of motion intact throughout and pain-free  Neurological: She is alert and oriented to person, place, and time.  Skin: Skin is warm and dry.  Psychiatric: She has a normal mood and affect. Her behavior is normal. Judgment and thought content normal.  Nursing note and vitals reviewed.    ED Treatments / Results  Labs (all labs ordered are listed, but only abnormal results are displayed) Labs Reviewed - No data to display  EKG  EKG Interpretation None       Radiology Dg Chest 2 View  Result Date: 05/25/2016 CLINICAL DATA:  Fall EXAM: CHEST  2 VIEW COMPARISON:  12/23/2015 FINDINGS: Cardiomediastinal silhouette is stable. No acute infiltrate or pleural effusion. No pulmonary edema. Stable bilateral basilar atelectasis or scarring. Mild degenerative changes and osteopenia thoracic spine. Stable mild  compression deformity upper lumbar spine. IMPRESSION: No active cardiopulmonary disease. Electronically Signed   By: Lahoma Crocker M.D.   On: 05/25/2016 13:37   Ct Head Wo Contrast  Result Date: 05/25/2016 CLINICAL DATA:  75 year old female who fell at home. Right frontal facial laceration. Initial encounter. EXAM: CT HEAD WITHOUT CONTRAST CT CERVICAL SPINE WITHOUT CONTRAST TECHNIQUE: Multidetector CT imaging of the head and cervical spine was performed following the standard protocol without intravenous contrast. Multiplanar CT image reconstructions of the cervical spine were also generated. COMPARISON:  Head CTs without contrast 05/13/2014 and earlier. FINDINGS: CT HEAD FINDINGS Brain: Trace subarachnoid hemorrhage in the right anterior cranial fossa. Very small focal right anterior isodense subdural hematoma, measuring up to 5 mm thick in an area of about 2 cm. No definite right inferior frontal gyrus contusion at this time. Gray-white matter differentiation remains normal. No ventriculomegaly or intraventricular hemorrhage. No other acute intracranial hemorrhage identified. No cortically based acute infarct identified. Chronic brainstem and cerebellar volume loss. Vascular: Calcified atherosclerosis at the skull base. Skull: Calvarium intact.  Visible facial bones appear intact. Sinuses/Orbits: Mild bubbly opacity in a posterior right ethmoid air cell. Other Visualized paranasal sinuses and mastoids are stable and well pneumatized. Other: Broad-based right forehead scalp hematoma measuring up to 7 mm. Orbits soft tissues remain normal. CT CERVICAL SPINE FINDINGS Alignment: Mild degenerative appearing anterolisthesis of C4 on C5. Associated right side facet degeneration. Relatively preserved cervical lordosis otherwise. Bilateral posterior element alignment is within normal limits. Cervicothoracic junction alignment is within normal limits. Skull base and vertebrae: Visualized skull base is intact. No  atlanto-occipital dissociation. Partially calcified degenerative ligamentous hypertrophy about the odontoid. No acute cervical spine fracture identified. Soft tissues and spinal canal: No prevertebral fluid or swelling. No visible canal hematoma. Partially retropharyngeal course of both carotids. Otherwise negative noncontrast neck soft tissues. Disc levels: Advanced cervical disc and endplate degeneration, worst at C5-C6 and C6-C7. Upper chest: Grossly intact visible upper thoracic levels. There is possibly a healed fracture of the posterior left first rib. Negative lung apices. Calcified aortic atherosclerosis. Other: None. IMPRESSION: 1. Small volume  right anterior cranial fossa Subarachnoid Hemorrhage. Small focal right anterior Subdural Hematoma. 2. No associated mass effect. 3. No definite associated right inferior frontal gyrus contusion, but consider a repeat head CT (e.g. in 12-24 hours) as contusions can be occult initially. 4. No associated skull fracture. Right frontal scalp soft tissue injury. 5. No acute fracture or listhesis identified in the cervical spine. Ligamentous injury is not excluded. 6. Critical Value/emergent results were called by telephone at the time of interpretation on 05/25/2016 at 1:41 pm to Dr. Montine Circle , who verbally acknowledged these results. Electronically Signed   By: Genevie Ann M.D.   On: 05/25/2016 13:43   Ct Cervical Spine Wo Contrast  Result Date: 05/25/2016 CLINICAL DATA:  75 year old female who fell at home. Right frontal facial laceration. Initial encounter. EXAM: CT HEAD WITHOUT CONTRAST CT CERVICAL SPINE WITHOUT CONTRAST TECHNIQUE: Multidetector CT imaging of the head and cervical spine was performed following the standard protocol without intravenous contrast. Multiplanar CT image reconstructions of the cervical spine were also generated. COMPARISON:  Head CTs without contrast 05/13/2014 and earlier. FINDINGS: CT HEAD FINDINGS Brain: Trace subarachnoid  hemorrhage in the right anterior cranial fossa. Very small focal right anterior isodense subdural hematoma, measuring up to 5 mm thick in an area of about 2 cm. No definite right inferior frontal gyrus contusion at this time. Gray-white matter differentiation remains normal. No ventriculomegaly or intraventricular hemorrhage. No other acute intracranial hemorrhage identified. No cortically based acute infarct identified. Chronic brainstem and cerebellar volume loss. Vascular: Calcified atherosclerosis at the skull base. Skull: Calvarium intact.  Visible facial bones appear intact. Sinuses/Orbits: Mild bubbly opacity in a posterior right ethmoid air cell. Other Visualized paranasal sinuses and mastoids are stable and well pneumatized. Other: Broad-based right forehead scalp hematoma measuring up to 7 mm. Orbits soft tissues remain normal. CT CERVICAL SPINE FINDINGS Alignment: Mild degenerative appearing anterolisthesis of C4 on C5. Associated right side facet degeneration. Relatively preserved cervical lordosis otherwise. Bilateral posterior element alignment is within normal limits. Cervicothoracic junction alignment is within normal limits. Skull base and vertebrae: Visualized skull base is intact. No atlanto-occipital dissociation. Partially calcified degenerative ligamentous hypertrophy about the odontoid. No acute cervical spine fracture identified. Soft tissues and spinal canal: No prevertebral fluid or swelling. No visible canal hematoma. Partially retropharyngeal course of both carotids. Otherwise negative noncontrast neck soft tissues. Disc levels: Advanced cervical disc and endplate degeneration, worst at C5-C6 and C6-C7. Upper chest: Grossly intact visible upper thoracic levels. There is possibly a healed fracture of the posterior left first rib. Negative lung apices. Calcified aortic atherosclerosis. Other: None. IMPRESSION: 1. Small volume right anterior cranial fossa Subarachnoid Hemorrhage. Small focal  right anterior Subdural Hematoma. 2. No associated mass effect. 3. No definite associated right inferior frontal gyrus contusion, but consider a repeat head CT (e.g. in 12-24 hours) as contusions can be occult initially. 4. No associated skull fracture. Right frontal scalp soft tissue injury. 5. No acute fracture or listhesis identified in the cervical spine. Ligamentous injury is not excluded. 6. Critical Value/emergent results were called by telephone at the time of interpretation on 05/25/2016 at 1:41 pm to Dr. Montine Circle , who verbally acknowledged these results. Electronically Signed   By: Genevie Ann M.D.   On: 05/25/2016 13:43    Procedures Procedures (including critical care time) LACERATION REPAIR Performed by: Montine Circle Authorized by: Montine Circle Consent: Verbal consent obtained. Risks and benefits: risks, benefits and alternatives were discussed Consent given by: patient Patient identity confirmed:  provided demographic data Prepped and Draped in normal sterile fashion Wound explored  Laceration Location: Right eyebrow  Laceration Length: 4 cm  No Foreign Bodies seen or palpated  Anesthesia: local infiltration  Local anesthetic: lidocaine 1% without epinephrine  Anesthetic total: 4 ml  Irrigation method: syringe Amount of cleaning: standard  Skin closure: 5-0 prolene  Number of sutures: 8  Technique: running  Patient tolerance: Patient tolerated the procedure well with no immediate complications.  Medications Ordered in ED Medications  lidocaine-EPINEPHrine (XYLOCAINE W/EPI) 2 %-1:200000 (PF) injection 20 mL (not administered)     Initial Impression / Assessment and Plan / ED Course  I have reviewed the triage vital signs and the nursing notes.  Pertinent labs & imaging results that were available during my care of the patient were reviewed by me and considered in my medical decision making (see chart for details).  Clinical Course    Patient  with fall, presumably from SCA-3, sustained head injury.  Will check CTs of head and neck. RN reports that patient is mildly hypoxic.  I think this is device error, patient had normal VS on triage.  Will check CXR.  Notified by radiology that patient has a small subarachnoid hemorrhage. Radiology recommends repeat CT scan in 12-24 hours.  Patient seen by and discussed with Dr. Eulis Foster, who agrees with plan for observation admission and repeat CT tomorrow.  Laceration repaired in the emergency department.  Patient is DNR.  3:22 PM Appreciate Dr. Wynelle Cleveland for admitting the patient.  Dr. Wynelle Cleveland asks that I consult neurosurgery for recommendations.   3:45 PM Appreciate telephone consultation with Dr. Joya Salm, from neurosurgery. States that plan for observation admission by hospitalist and repeat scan tomorrow is acceptable. He states that he is available should something unforseen develop.   Subarachnoid hemorrhage. CRITICAL CARE Performed by: Montine Circle   Total critical care time: 40 minutes  Critical care time was exclusive of separately billable procedures and treating other patients.  Critical care was necessary to treat or prevent imminent or life-threatening deterioration.  Critical care was time spent personally by me on the following activities: development of treatment plan with patient and/or surrogate as well as nursing, discussions with consultants, evaluation of patient's response to treatment, examination of patient, obtaining history from patient or surrogate, ordering and performing treatments and interventions, ordering and review of laboratory studies, ordering and review of radiographic studies, pulse oximetry and re-evaluation of patient's condition.   Final Clinical Impressions(s) / ED Diagnoses   Final diagnoses:  SAH (subarachnoid hemorrhage) ([place])  Fall, initial encounter    New Prescriptions New Prescriptions   No medications on file     Montine Circle, PA-C 05/25/16 [place], MD 05/25/16 2115    Daleen Bo, MD 05/25/16 2116

## 2016-05-25 NOTE — ED Provider Notes (Signed)
  Face-to-face evaluation   History: She describes sitting in a wheelchair and falling forward. She complains only of a cut on her head. She states she has fallen like this previously. She states that she is not supposed to walk.  Physical exam: Alert, elderly patient who is conversant. Right forehead, with large laceration in the eyebrow region. No associated deformity or crepitation. She is able to move arms and legs. She has marked atrophy of the legs consistent with nonuse.  Medical screening examination/treatment/procedure(s) were conducted as a shared visit with non-physician practitioner(s) and myself.  I personally evaluated the patient during the encounter   Daleen Bo, MD 05/25/16 2117

## 2016-05-25 NOTE — ED Notes (Signed)
Patient transported to CT 

## 2016-05-25 NOTE — ED Notes (Signed)
Bed: [id number] Expected date:  Expected time:  Means of arrival:  Comments: Hold fall-head lac

## 2016-05-25 NOTE — H&P (Signed)
History and Physical    Wendy Erickson [id number] DOB: Apr 16, 1941 DOA: 05/25/2016    PCP: Mathews Argyle, MD  Patient coming from: [place] [facility]  Chief Complaint: Fall  HPI: Wendy Erickson is a 75 y.o. 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 She states that she was sitting in her wheelchair and slipped off of it and hit her head on the floor. Currently is complaining of pain over her right eye and pain in her right knee. No headache. She does not recall hitting her knee. Currently she has no new neurological deficits-specifically no visual disturbance, no new focal weakness or sensory deficits. She has chronic numbness in feet and legs.   ED Course: CT head reveals small volume right anterior cranial fossa subarachnoid hemorrhage, small focal right anterior subdural hematoma WBC count 14 BUN 27 creatinine 0.9 (ratio greater than 20) INR normal at 0.98  Review of Systems:  Admits to depression and swelling of feet and legs All other systems reviewed and apart from HPI, are negative.  Past Medical History:  Diagnosis Date  . Abnormality of gait 12/05/2014  . Arthritis    "knees" (05/14/2014)  . 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 Chronic kidney disease    left renal neoplasm  . Depression   . Dysphagia, pharyngoesophageal phase 12/05/2014  . Foot fracture, right   . Gait disorder   . Heart murmur   . High cholesterol   . History of gout   . Hypertension   . Kidney malignancy ([place])    left renal neoplasm  . Osteopenia   . Peripheral neuropathy ([place])   . 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 Shingles    Right V1 distribution  . Trigeminal neuralgia of right side of  face 06/09/2015   V1 distribution    Past Surgical History:  Procedure Laterality Date  . BACK SURGERY    . CATARACT EXTRACTION W/ INTRAOCULAR LENS IMPLANT Left   . DILATION AND CURETTAGE OF UTERUS    . KNEE ARTHROSCOPY Bilateral   . LAPAROSCOPIC NEPHRECTOMY  10/06/2011   Procedure: LAPAROSCOPIC NEPHRECTOMY;  Surgeon: Dutch Gray, MD;  Location: [facility];  Service: Urology;  Laterality: Left;  LEFT LAPAROSCOPIC RADICAL NEPHRECTOMY   . LUMBAR DISC SURGERY     "herniated disc"  . TONSILLECTOMY    . VAGINAL HYSTERECTOMY      Social History:   reports that she quit smoking about 10 years ago. Her smoking use included Cigarettes. She has a 40.00 pack-year smoking history. She has never used smokeless tobacco. She reports that she does not drink alcohol or use drugs.  Lives in assisted living and does not ambulate-as a motorized wheelchair  No Known Allergies  Family History  Problem Relation Age of Onset  . Ataxia Father   . Dementia Mother      Prior to Admission medications   Medication Sig Start Date End Date Taking? Authorizing Provider  acetaminophen (TYLENOL) 325 MG tablet Take 650 mg by mouth every 4 (four) hours as needed for moderate pain or fever (max 10 in 24 hours).   Yes Historical Provider, MD  acetaminophen (TYLENOL) 650 MG CR tablet Take 650 mg by mouth every 8 (eight) hours as needed for pain.    Yes Historical Provider, MD  amLODipine (NORVASC) 2.5 MG tablet Take 2.5 mg by mouth daily.   Yes Historical Provider, MD  aspirin 81 MG tablet Take 81 mg by mouth every morning.    Yes Historical Provider, MD  atorvastatin (LIPITOR) 40 MG tablet Take 40 mg by mouth at bedtime.    Yes Historical Provider, MD  Calcium Carbonate-Vitamin D (CALCARB 600/D) 600-400 MG-UNIT per tablet Take 1 tablet by mouth every morning.    Yes Historical Provider, MD  carbamazepine (TEGRETOL) 200 MG tablet Take 1 tablet (200 mg total) by mouth 2 (two) times daily. 06/05/14  Yes Kathrynn Ducking, MD   chlorhexidine (PERIDEX) 0.12 % solution Use as directed 15 mLs in the mouth or throat 2 (two) times daily. Swish and spit   Yes Historical Provider, MD  cholecalciferol (VITAMIN D) 1000 UNITS tablet Take 2,000 Units by mouth every morning.   Yes Historical Provider, MD  FLUoxetine (PROZAC) 20 MG capsule Take 20 mg by mouth every morning.    Yes Historical Provider, MD  gabapentin (NEURONTIN) 300 MG capsule Take 300 mg by mouth 2 (two) times daily.    Yes Historical Provider, MD  HYDROcodone-acetaminophen (NORCO/VICODIN) 5-325 MG per tablet Take 2 tablets by mouth every 6 (six) hours as needed for moderate pain. 10/29/14  Yes Pamella Pert, MD  Influenza Virus Vaccine Split (FLUZONE IM) Inject 1 Dose into the muscle once.   Yes Historical Provider, MD  metoprolol tartrate (LOPRESSOR) 25 MG tablet Take 25 mg by mouth 2 (two) times daily.   Yes Historical Provider, MD  polycarbophil (FIBERCON) 625 MG tablet Take 625 mg by mouth 2 (two) times daily.    Yes Historical Provider, MD  polyethylene glycol (MIRALAX) packet Take 17 g by mouth daily as needed for moderate constipation. 05/14/14  Yes Domenic Polite, MD  senna (SENOKOT) 8.6 MG tablet Take 1 tablet by mouth 2 (two) times daily.    Yes Historical Provider, MD  traMADol-acetaminophen (ULTRACET) 37.5-325 MG per tablet Take 1 tablet by mouth 2 (two) times daily. And may take 1 tablet every 6 hours as needed for pain 02/18/13  Yes Historical Provider, MD    Physical Exam: Vitals:   05/25/16 1221 05/25/16 1428 05/25/16 1500 05/25/16 1515  BP:  178/70 195/70   Pulse:  61 (!) 54 (!) 52  Resp:  18    Temp:      TempSrc:      SpO2: 98% 94% (!) 89% 95%  Weight:      Height:          Constitutional: NAD, calm, comfortable Eyes: PERTLA, Right eye has significant periorbital edema and bruising, laceration above left eye, conjunctivae normal ENMT: Mucous membranes are moist. Posterior pharynx clear of any exudate or lesions. Normal dentition.    Neck: normal, supple, no masses, no thyromegaly Respiratory: clear to auscultation bilaterally, no wheezing, no crackles. Normal respiratory effort. No accessory muscle use.  Cardiovascular: S1 & S2 heard, regular rate and rhythm, no murmurs / rubs / gallops. No extremity edema. 2+ pedal pulses. No carotid bruits.  Abdomen: No distension, no tenderness, no masses palpated. No hepatosplenomegaly. Bowel sounds normal.  Musculoskeletal: no clubbing / cyanosis. No joint deformity upper and lower extremities. Good ROM, no contractures. Normal muscle tone. - left knee is a bit erythematous but not swollen and non-tender Skin: no rashes, lesions, ulcers. No induration Neurologic: CN 2-12 grossly intact.  Decreased sensation in feet and legs , DTR normal. Strength 5/5 in all 4 limbs.  Psychiatric:  Normal judgment and insight. Alert and oriented x 3. Normal mood.     Labs on Admission: I have personally reviewed following labs and imaging studies  CBC:  Recent Labs Lab 05/25/16 1405  WBC 14.0*  NEUTROABS 12.0*  HGB 13.4  HCT 41.2  MCV 91.6  PLT 0000000   Basic Metabolic Panel:  Recent Labs Lab 05/25/16 1405  NA 139  K 4.2  CL 100*  CO2 29  GLUCOSE 91  BUN 27*  CREATININE 0.90  CALCIUM 9.9   GFR: Estimated Creatinine Clearance: 53.2 mL/min (by C-G formula based on SCr of 0.9 mg/dL). Liver Function Tests: No results for input(s): AST, ALT, ALKPHOS, BILITOT, PROT, ALBUMIN in the last 168 hours. No results for input(s): LIPASE, AMYLASE in the last 168 hours. No results for input(s): AMMONIA in the last 168 hours. Coagulation Profile:  Recent Labs Lab 05/25/16 1405  INR 0.98   Cardiac Enzymes: No results for input(s): CKTOTAL, CKMB, CKMBINDEX, TROPONINI in the last 168 hours. BNP (last 3 results) No results for input(s): PROBNP in the last 8760 hours. HbA1C: No results for input(s): HGBA1C in the last 72 hours. CBG: No results for input(s): GLUCAP in the last 168  hours. Lipid Profile: No results for input(s): CHOL, HDL, LDLCALC, TRIG, CHOLHDL, LDLDIRECT in the last 72 hours. Thyroid Function Tests: No results for input(s): TSH, T4TOTAL, FREET4, T3FREE, THYROIDAB in the last 72 hours. Anemia Panel: No results for input(s): VITAMINB12, FOLATE, FERRITIN, TIBC, IRON, RETICCTPCT in the last 72 hours. Urine analysis:    Component Value Date/Time   COLORURINE YELLOW 10/29/2014 0816   APPEARANCEUR CLOUDY (A) 10/29/2014 0816   LABSPEC 1.015 10/29/2014 0816   PHURINE 6.0 10/29/2014 0816   GLUCOSEU NEGATIVE 10/29/2014 0816   HGBUR NEGATIVE 10/29/2014 0816   BILIRUBINUR NEGATIVE 10/29/2014 0816   KETONESUR NEGATIVE 10/29/2014 0816   PROTEINUR NEGATIVE 10/29/2014 0816   UROBILINOGEN 0.2 10/29/2014 0816   NITRITE POSITIVE (A) 10/29/2014 0816   LEUKOCYTESUR SMALL (A) 10/29/2014 0816   Sepsis Labs: @LABRCNTIP (procalcitonin:4,lacticidven:4) )No results found for this or any previous visit (from the past 240 hour(s)).   Radiological Exams on Admission: Dg Chest 2 View  Result Date: 05/25/2016 CLINICAL DATA:  Fall EXAM: CHEST  2 VIEW COMPARISON:  12/23/2015 FINDINGS: Cardiomediastinal silhouette is stable. No acute infiltrate or pleural effusion. No pulmonary edema. Stable bilateral basilar atelectasis or scarring. Mild degenerative changes and osteopenia thoracic spine. Stable mild compression deformity upper lumbar spine. IMPRESSION: No active cardiopulmonary disease. Electronically Signed   By: Lahoma Crocker M.D.   On: 05/25/2016 13:37   Ct Head Wo Contrast  Result Date: 05/25/2016 CLINICAL DATA:  75 year old female who fell at home. Right frontal facial laceration. Initial encounter. EXAM: CT HEAD WITHOUT CONTRAST CT CERVICAL SPINE WITHOUT CONTRAST TECHNIQUE: Multidetector CT imaging of the head and cervical spine was performed following the standard protocol without intravenous contrast. Multiplanar CT image reconstructions of the cervical spine were also  generated. COMPARISON:  Head CTs without contrast 05/13/2014 and earlier. FINDINGS: CT HEAD FINDINGS Brain: Trace subarachnoid hemorrhage in the right anterior cranial fossa. Very small focal right anterior isodense subdural hematoma, measuring up to 5 mm thick in an area of about 2 cm. No definite right inferior frontal gyrus contusion at this time. Gray-white matter differentiation remains normal. No ventriculomegaly or intraventricular hemorrhage. No other acute intracranial hemorrhage identified. No cortically based acute infarct identified. Chronic brainstem and cerebellar volume loss. Vascular: Calcified atherosclerosis at the skull base. Skull: Calvarium intact.  Visible facial bones appear intact. Sinuses/Orbits: Mild bubbly opacity in a posterior right ethmoid air cell. Other Visualized paranasal sinuses and mastoids are stable and well pneumatized. Other: Broad-based right forehead scalp hematoma measuring up to 7 mm. Orbits soft tissues remain normal. CT CERVICAL SPINE FINDINGS Alignment: Mild degenerative appearing anterolisthesis of C4 on C5. Associated right side facet degeneration. Relatively preserved cervical lordosis otherwise. Bilateral posterior element alignment is within normal limits. Cervicothoracic junction alignment is within normal limits. Skull base and vertebrae: Visualized skull base is intact. No atlanto-occipital dissociation. Partially calcified degenerative ligamentous hypertrophy about the odontoid. No acute cervical spine fracture identified. Soft tissues and spinal canal: No prevertebral fluid or swelling. No visible canal hematoma. Partially retropharyngeal course of both carotids. Otherwise negative noncontrast neck soft tissues. Disc levels: Advanced cervical disc and endplate degeneration, worst at C5-C6 and C6-C7. Upper chest: Grossly intact visible upper thoracic levels. There is possibly a healed fracture of the posterior left first rib. Negative lung apices. Calcified  aortic atherosclerosis. Other: None. IMPRESSION: 1. Small volume right anterior cranial fossa Subarachnoid Hemorrhage. Small focal right anterior Subdural Hematoma. 2. No associated mass effect. 3. No definite associated right inferior frontal gyrus contusion, but consider a repeat head CT (e.g. in 12-24 hours) as contusions can be occult initially. 4. No associated skull fracture. Right frontal scalp soft tissue injury. 5. No acute fracture or listhesis identified in the cervical spine. Ligamentous injury is not excluded. 6. Critical Value/emergent results were called by telephone at the time of interpretation on 05/25/2016 at 1:41 pm to Dr. Montine Circle , who verbally acknowledged these results. Electronically Signed   By: Genevie Ann M.D.   On: 05/25/2016 13:43   Ct Cervical Spine Wo Contrast  Result Date: 05/25/2016 CLINICAL DATA:  75 year old female who fell at home. Right frontal facial laceration. Initial encounter. EXAM: CT HEAD WITHOUT CONTRAST CT CERVICAL SPINE WITHOUT CONTRAST TECHNIQUE: Multidetector CT imaging of the head and cervical spine was performed following the standard protocol without intravenous contrast. Multiplanar CT image reconstructions of the cervical spine were also generated. COMPARISON:  Head CTs without contrast 05/13/2014 and earlier. FINDINGS: CT HEAD FINDINGS Brain: Trace subarachnoid hemorrhage in the right anterior cranial fossa. Very small focal right anterior isodense subdural hematoma, measuring up to 5 mm thick in an area of about 2 cm. No definite right inferior frontal gyrus contusion at this time. Gray-white matter differentiation remains normal. No ventriculomegaly or intraventricular hemorrhage. No other acute intracranial hemorrhage identified. No cortically based acute infarct identified. Chronic brainstem and cerebellar volume loss. Vascular: Calcified atherosclerosis at the skull base. Skull: Calvarium intact.  Visible facial bones appear intact. Sinuses/Orbits:  Mild bubbly opacity in a posterior right ethmoid air cell. Other Visualized paranasal sinuses and mastoids are stable and well pneumatized. Other: Broad-based right forehead scalp hematoma measuring up to 7 mm. Orbits soft tissues remain normal. CT CERVICAL SPINE FINDINGS Alignment: Mild degenerative appearing anterolisthesis of C4 on C5. Associated right side facet degeneration. Relatively preserved cervical lordosis otherwise. Bilateral posterior element alignment is within normal limits. Cervicothoracic junction alignment is within normal limits. Skull base and vertebrae: Visualized skull base is intact. No atlanto-occipital dissociation. Partially calcified degenerative ligamentous hypertrophy about the odontoid. No acute cervical spine fracture identified. Soft tissues and spinal canal: No prevertebral fluid or swelling. No visible canal hematoma. Partially retropharyngeal course of both carotids. Otherwise negative noncontrast neck soft tissues. Disc levels: Advanced cervical disc and endplate degeneration, worst at C5-C6 and C6-C7. Upper chest: Grossly intact  visible upper thoracic levels. There is possibly a healed fracture of the posterior left first rib. Negative lung apices. Calcified aortic atherosclerosis. Other: None. IMPRESSION: 1. Small volume right anterior cranial fossa Subarachnoid Hemorrhage. Small focal right anterior Subdural Hematoma. 2. No associated mass effect. 3. No definite associated right inferior frontal gyrus contusion, but consider a repeat head CT (e.g. in 12-24 hours) as contusions can be occult initially. 4. No associated skull fracture. Right frontal scalp soft tissue injury. 5. No acute fracture or listhesis identified in the cervical spine. Ligamentous injury is not excluded. 6. Critical Value/emergent results were called by telephone at the time of interpretation on 05/25/2016 at 1:41 pm to Dr. Montine Circle , who verbally acknowledged these results. Electronically Signed    By: Genevie Ann M.D.   On: 05/25/2016 13:43    Assessment/Plan Principal Problem:   Subarachnoid hemorrhage/ Subdural hematoma  -We'll order neuro checks and repeat CT tomorrow morning -Hold aspirin -Resume Ultracet and hydrocodone which she takes for pain as outpatient -ER is contacting Dr. Saintclair Halsted who is on-call for neurosurgery to ensure that no further intervention is necessary  Active Problems: Right supraorbital laceration and eye swelling -Follow  Fall -Wheelchair-bound -Imaging of the C-spine does not show any injuries  Leukocytosis -Likely Stress response-follow  Dehydration -Slow IV fluids for 12 hours    HTN (hypertension) -Continue amlodipine and metoprolol with holding parameters  Hyperlipidemia -Lipitor  Depression -Prozac  Neuropathy - Neurontin  Left-sided nephrectomy for renal cancer   Note: Unsure why she is on carbamazepine   DVT prophylaxis: Lovenox Code Status: DO NOT RESUSCITATE  Family Communication:   Disposition Plan: Return to assisted living when stable  Consults called: ER to consult neurosurgery-Dr. Saintclair Halsted  Admission status: Observation    Aldric Wenzler MD [facility] Pager: [email or web address] Password [email or web address] 7PM-7AM, please contact night-coverage   05/25/2016, 3:57 PM

## 2016-05-26 ENCOUNTER — Encounter (HOSPITAL_COMMUNITY): Payer: Self-pay | Admitting: Radiology

## 2016-05-26 ENCOUNTER — Observation Stay (HOSPITAL_COMMUNITY): Payer: Medicare Other

## 2016-05-26 DIAGNOSIS — R259 Unspecified abnormal involuntary movements: Secondary | ICD-10-CM | POA: Diagnosis not present

## 2016-05-26 DIAGNOSIS — I10 Essential (primary) hypertension: Secondary | ICD-10-CM | POA: Diagnosis not present

## 2016-05-26 DIAGNOSIS — Z79899 Other long term (current) drug therapy: Secondary | ICD-10-CM | POA: Diagnosis not present

## 2016-05-26 DIAGNOSIS — I129 Hypertensive chronic kidney disease with stage 1 through stage 4 chronic kidney disease, or unspecified chronic kidney disease: Secondary | ICD-10-CM | POA: Diagnosis present

## 2016-05-26 DIAGNOSIS — S066X9A Traumatic subarachnoid hemorrhage with loss of consciousness of unspecified duration, initial encounter: Secondary | ICD-10-CM | POA: Diagnosis present

## 2016-05-26 DIAGNOSIS — S065X9A Traumatic subdural hemorrhage with loss of consciousness of unspecified duration, initial encounter: Secondary | ICD-10-CM | POA: Diagnosis present

## 2016-05-26 DIAGNOSIS — S01111A Laceration without foreign body of right eyelid and periocular area, initial encounter: Secondary | ICD-10-CM | POA: Diagnosis present

## 2016-05-26 DIAGNOSIS — W19XXXA Unspecified fall, initial encounter: Secondary | ICD-10-CM

## 2016-05-26 DIAGNOSIS — Z993 Dependence on wheelchair: Secondary | ICD-10-CM | POA: Diagnosis not present

## 2016-05-26 DIAGNOSIS — S0990XA Unspecified injury of head, initial encounter: Secondary | ICD-10-CM | POA: Diagnosis not present

## 2016-05-26 DIAGNOSIS — Z85528 Personal history of other malignant neoplasm of kidney: Secondary | ICD-10-CM | POA: Diagnosis not present

## 2016-05-26 DIAGNOSIS — S066X0A Traumatic subarachnoid hemorrhage without loss of consciousness, initial encounter: Secondary | ICD-10-CM | POA: Diagnosis not present

## 2016-05-26 DIAGNOSIS — F329 Major depressive disorder, single episode, unspecified: Secondary | ICD-10-CM | POA: Diagnosis present

## 2016-05-26 DIAGNOSIS — Z87891 Personal history of nicotine dependence: Secondary | ICD-10-CM | POA: Diagnosis not present

## 2016-05-26 DIAGNOSIS — E860 Dehydration: Secondary | ICD-10-CM | POA: Diagnosis present

## 2016-05-26 DIAGNOSIS — E785 Hyperlipidemia, unspecified: Secondary | ICD-10-CM | POA: Diagnosis present

## 2016-05-26 DIAGNOSIS — G629 Polyneuropathy, unspecified: Secondary | ICD-10-CM | POA: Diagnosis present

## 2016-05-26 DIAGNOSIS — I609 Nontraumatic subarachnoid hemorrhage, unspecified: Secondary | ICD-10-CM | POA: Diagnosis not present

## 2016-05-26 DIAGNOSIS — Z905 Acquired absence of kidney: Secondary | ICD-10-CM | POA: Diagnosis not present

## 2016-05-26 DIAGNOSIS — N189 Chronic kidney disease, unspecified: Secondary | ICD-10-CM | POA: Diagnosis present

## 2016-05-26 DIAGNOSIS — Z7982 Long term (current) use of aspirin: Secondary | ICD-10-CM | POA: Diagnosis not present

## 2016-05-26 DIAGNOSIS — R270 Ataxia, unspecified: Secondary | ICD-10-CM | POA: Diagnosis present

## 2016-05-26 DIAGNOSIS — R51 Headache: Secondary | ICD-10-CM | POA: Diagnosis present

## 2016-05-26 DIAGNOSIS — Y92129 Unspecified place in nursing home as the place of occurrence of the external cause: Secondary | ICD-10-CM | POA: Diagnosis not present

## 2016-05-26 DIAGNOSIS — W050XXA Fall from non-moving wheelchair, initial encounter: Secondary | ICD-10-CM | POA: Diagnosis present

## 2016-05-26 DIAGNOSIS — M8580 Other specified disorders of bone density and structure, unspecified site: Secondary | ICD-10-CM | POA: Diagnosis present

## 2016-05-26 DIAGNOSIS — Z66 Do not resuscitate: Secondary | ICD-10-CM | POA: Diagnosis present

## 2016-05-26 LAB — BASIC METABOLIC PANEL
ANION GAP: 8 (ref 5–15)
BUN: 32 mg/dL — AB (ref 6–20)
CALCIUM: 9.1 mg/dL (ref 8.9–10.3)
CO2: 27 mmol/L (ref 22–32)
Chloride: 104 mmol/L (ref 101–111)
Creatinine, Ser: 0.99 mg/dL (ref 0.44–1.00)
GFR calc Af Amer: >60 mL/min (ref >60)
GFR, EST NON AFRICAN AMERICAN: 55 mL/min — AB (ref >60)
GLUCOSE: 101 mg/dL — AB (ref 65–99)
Potassium: 4.5 mmol/L (ref 3.5–5.1)
Sodium: 139 mmol/L (ref 135–145)

## 2016-05-26 LAB — CBC
HCT: 36.2 % (ref 36.0–46.0)
Hemoglobin: 11.8 g/dL — ABNORMAL LOW (ref 12.0–15.0)
MCH: 29.9 pg (ref 26.0–34.0)
MCHC: 32.6 g/dL (ref 30.0–36.0)
MCV: 91.6 fL (ref 78.0–100.0)
PLATELETS: 308 10*3/uL (ref 150–400)
RBC: 3.95 MIL/uL (ref 3.87–5.11)
RDW: 14.5 % (ref 11.5–15.5)
WBC: 9.2 10*3/uL (ref 4.0–10.5)

## 2016-05-26 MED ORDER — METOPROLOL TARTRATE 25 MG PO TABS
12.5000 mg | ORAL_TABLET | Freq: Two times a day (BID) | ORAL | Status: DC
  Administered 2016-05-27: 12.5 mg via ORAL
  Filled 2016-05-26: qty 1

## 2016-05-26 NOTE — Evaluation (Signed)
Physical Therapy Evaluation Patient Details Name: Wendy Erickson MRN: [id number] DOB: [birth year] Today's Date: 05/26/2016   History of Present Illness  Pt s/p fall from [facility] at [facility] and with small subarachnoid hemorrhage.  Pt with hx of peripheral neuropathy, back surgery and ataxia 2* cerebellar degeneration  Clinical Impression  Pt admitted as above and presenting with functional mobility limitations 2* generalized weakness, ataxia, and balance deficits.  Pt plans return to previous living arrangement at [facility]    Follow Up Recommendations SNF    Equipment Recommendations       Recommendations for Other Services       Precautions / Restrictions Precautions Precautions: Fall Restrictions Weight Bearing Restrictions: No      Mobility  Bed Mobility Overal bed mobility: Needs Assistance;+ 2 for safety/equipment;+2 for physical assistance Bed Mobility: Supine to Sit;Sit to Supine     Supine to sit: Mod assist;+2 for safety/equipment Sit to supine: Mod assist;+2 for safety/equipment   General bed mobility comments: Cues and physical assist to roll to side and transfer to sitting.  Pt sitting EOB ~16 min with min to mod assist and use of Bil UEs to correct for L and posterior drift  Transfers                 General transfer comment: NT 2* LE weakness and poor motor control  Ambulation/Gait             General Gait Details: Pt is nonambulatory at this time  Stairs            Wheelchair Mobility    Modified Rankin (Stroke Patients Only)       Balance Overall balance assessment: Needs assistance Sitting-balance support: Bilateral upper extremity supported Sitting balance-Leahy Scale: Poor   Postural control: Posterior lean;Left lateral lean                                   Pertinent Vitals/Pain Pain Assessment: No/denies pain    Home Living Family/patient expects to be discharged to:: Skilled nursing facility                       Prior Function Level of [place]: Needs assistance   Gait / Transfers Assistance Needed: Pt nonambulatory and reports SNF uses mechanical lift to assist to [employer] chair  ADL's / Homemaking Assistance Needed: Assisted by staff at [facility] home  Comments: Pt states has not ambulated "for a long time"     Hand Dominance   Dominant Hand: Left    Extremity/Trunk Assessment   Upper Extremity Assessment: Generalized weakness;RUE deficits/detail;LUE deficits/detail RUE Deficits / Details: ataxic movements - LEs more affected than UEs         Lower Extremity Assessment: RLE deficits/detail;LLE deficits/detail;Generalized weakness RLE Deficits / Details: Voluntary movement noted bil LEs but with noted weakness and ataxia    Cervical / Trunk Assessment: Kyphotic  Communication   Communication: No difficulties  Cognition Arousal/Alertness: Awake/alert Behavior During Therapy: WFL for tasks assessed/performed Overall Cognitive Status: Within Functional Limits for tasks assessed                      General Comments      Exercises     Assessment/Plan    PT Assessment Patient needs continued PT services  PT Problem List Decreased strength;Decreased range of motion;Decreased activity tolerance;Decreased balance;Decreased mobility;Decreased coordination;Decreased knowledge of use  of DME;Obesity;Pain          PT Treatment Interventions DME instruction;Functional mobility training;Therapeutic activities;Therapeutic exercise;Balance training;Patient/family education    PT Goals (Current goals can be found in the Care Plan section)  Acute Rehab PT Goals Patient Stated Goal: Return to previous living arrangement PT Goal Formulation: With patient Time For Goal Achievement: 06/07/16 Potential to Achieve Goals: Fair    Frequency Min 2X/week   Barriers to discharge        Co-evaluation               End of Session   Activity  Tolerance: Patient tolerated treatment well;Patient limited by fatigue Patient left: in bed;with call bell/phone within reach;with bed alarm set Nurse Communication: Mobility status;Need for lift equipment         Time: [id number] PT Time Calculation (min) (ACUTE ONLY): 30 min   Charges:   PT Evaluation $PT Eval Moderate Complexity: 1 Procedure PT Treatments $Therapeutic Activity: 8-22 mins   PT G Codes:        Kayman Snuffer Jun 24, 2016, 6:07 PM

## 2016-05-26 NOTE — Progress Notes (Signed)
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 She was found to have subarachnoid hemorrhage and subdural hematoma. She was hospitalized for further management.  Reason for Visit: Subarachnoid hemorrhage and subdural hematoma  Consultants: Neurosurgery  Procedures: None  Antibiotics: None  Subjective/Interval History: Patient feels better this morning. Her head is not hurting as much as it was yesterday. She denies any nausea or vomiting. No abdominal pain.  ROS: Denies any chest pain or shortness of breath.  Objective:  Vital Signs  Vitals:   05/26/16 0700 05/26/16 0800 05/26/16 0900 05/26/16 1000  BP: (!) 127/47 (!) 108/21 (!) 145/123 (!) 109/31  Pulse: (!) 51 (!) 50 (!) 58 (!) 58  Resp: 14 10 14 13   Temp:  98.5 F (36.9 C)    TempSrc:  Oral    SpO2: 96% 94% 90% (!) 89%  Weight:      Height:        Intake/Output Summary (Last 24 hours) at 05/26/16 1104 Last data filed at 05/26/16 1000  Gross per 24 hour  Intake              560 ml  Output                0 ml  Net              560 ml   Filed Weights   05/25/16 1211  Weight: 72.6 kg (160 lb)    General appearance: alert, cooperative and no distress Head: Bruising noted over the right orbit. Eye is swollen and shut. Laceration noted over the right eyebrow. Resp: clear to auscultation bilaterally Cardio: regular rate and rhythm, S1, S2 normal, no murmur, click, rub or gallop GI: soft, non-tender; bowel sounds normal; no masses,  no organomegaly Extremities: extremities normal, atraumatic, no cyanosis or  edema Neurologic: Awake. Slightly distracted. Cooperative. No new obvious focal deficits  Lab Results:  Data Reviewed: I have personally reviewed following labs and imaging studies  CBC:  Recent Labs Lab 05/25/16 1405 05/26/16 0358  WBC 14.0* 9.2  NEUTROABS 12.0*  --   HGB 13.4 11.8*  HCT 41.2 36.2  MCV 91.6 91.6  PLT 324 [id number]    Basic Metabolic Panel:  Recent Labs Lab 05/25/16 1405 05/26/16 0358  NA 139 139  K 4.2 4.5  CL 100* 104  CO2 29 27  GLUCOSE 91 101*  BUN 27* 32*  CREATININE 0.90 0.99  CALCIUM 9.9 9.1    GFR: Estimated Creatinine Clearance: 48.3 mL/min (by C-G formula based on SCr of 0.99 mg/dL).  Coagulation Profile:  Recent Labs Lab 05/25/16 1405  INR 0.98     Recent Results (from the past 240 hour(s))  MRSA PCR Screening     Status: None   Collection Time: 05/25/16  5:17 PM  Result Value Ref Range Status   MRSA by PCR NEGATIVE NEGATIVE Final    Comment:        The GeneXpert MRSA Assay (FDA approved for NASAL specimens only), is one component of a comprehensive MRSA colonization surveillance program. It is not intended to diagnose MRSA infection nor to guide or monitor treatment for MRSA infections.  Radiology Studies: Dg Chest 2 View  Result Date: 05/25/2016 CLINICAL DATA:  Fall EXAM: CHEST  2 VIEW COMPARISON:  12/23/2015 FINDINGS: Cardiomediastinal silhouette is stable. No acute infiltrate or pleural effusion. No pulmonary edema. Stable bilateral basilar atelectasis or scarring. Mild degenerative changes and osteopenia thoracic spine. Stable mild compression deformity upper lumbar spine. IMPRESSION: No active cardiopulmonary disease. Electronically Signed   By: Lahoma Crocker M.D.   On: 05/25/2016 13:37   Ct Head Wo Contrast  Result Date: 05/25/2016 CLINICAL DATA:  75 year old female who fell at home. Right frontal facial laceration. Initial encounter. EXAM: CT HEAD WITHOUT CONTRAST CT CERVICAL SPINE WITHOUT CONTRAST TECHNIQUE:  Multidetector CT imaging of the head and cervical spine was performed following the standard protocol without intravenous contrast. Multiplanar CT image reconstructions of the cervical spine were also generated. COMPARISON:  Head CTs without contrast 05/13/2014 and earlier. FINDINGS: CT HEAD FINDINGS Brain: Trace subarachnoid hemorrhage in the right anterior cranial fossa. Very small focal right anterior isodense subdural hematoma, measuring up to 5 mm thick in an area of about 2 cm. No definite right inferior frontal gyrus contusion at this time. Gray-white matter differentiation remains normal. No ventriculomegaly or intraventricular hemorrhage. No other acute intracranial hemorrhage identified. No cortically based acute infarct identified. Chronic brainstem and cerebellar volume loss. Vascular: Calcified atherosclerosis at the skull base. Skull: Calvarium intact.  Visible facial bones appear intact. Sinuses/Orbits: Mild bubbly opacity in a posterior right ethmoid air cell. Other Visualized paranasal sinuses and mastoids are stable and well pneumatized. Other: Broad-based right forehead scalp hematoma measuring up to 7 mm. Orbits soft tissues remain normal. CT CERVICAL SPINE FINDINGS Alignment: Mild degenerative appearing anterolisthesis of C4 on C5. Associated right side facet degeneration. Relatively preserved cervical lordosis otherwise. Bilateral posterior element alignment is within normal limits. Cervicothoracic junction alignment is within normal limits. Skull base and vertebrae: Visualized skull base is intact. No atlanto-occipital dissociation. Partially calcified degenerative ligamentous hypertrophy about the odontoid. No acute cervical spine fracture identified. Soft tissues and spinal canal: No prevertebral fluid or swelling. No visible canal hematoma. Partially retropharyngeal course of both carotids. Otherwise negative noncontrast neck soft tissues. Disc levels: Advanced cervical disc and endplate  degeneration, worst at C5-C6 and C6-C7. Upper chest: Grossly intact visible upper thoracic levels. There is possibly a healed fracture of the posterior left first rib. Negative lung apices. Calcified aortic atherosclerosis. Other: None. IMPRESSION: 1. Small volume right anterior cranial fossa Subarachnoid Hemorrhage. Small focal right anterior Subdural Hematoma. 2. No associated mass effect. 3. No definite associated right inferior frontal gyrus contusion, but consider a repeat head CT (e.g. in 12-24 hours) as contusions can be occult initially. 4. No associated skull fracture. Right frontal scalp soft tissue injury. 5. No acute fracture or listhesis identified in the cervical spine. Ligamentous injury is not excluded. 6. Critical Value/emergent results were called by telephone at the time of interpretation on 05/25/2016 at 1:41 pm to Dr. Montine Circle , who verbally acknowledged these results. Electronically Signed   By: Genevie Ann M.D.   On: 05/25/2016 13:43   Ct Cervical Spine Wo Contrast  Result Date: 05/25/2016 CLINICAL DATA:  75 year old female who fell at home. Right frontal facial laceration. Initial encounter. EXAM: CT HEAD WITHOUT CONTRAST CT CERVICAL SPINE WITHOUT CONTRAST TECHNIQUE: Multidetector CT imaging of the head and cervical spine was performed following the standard protocol without intravenous contrast. Multiplanar CT image reconstructions of the cervical spine were also generated. COMPARISON:  Head CTs without contrast 05/13/2014 and earlier. FINDINGS:  CT HEAD FINDINGS Brain: Trace subarachnoid hemorrhage in the right anterior cranial fossa. Very small focal right anterior isodense subdural hematoma, measuring up to 5 mm thick in an area of about 2 cm. No definite right inferior frontal gyrus contusion at this time. Gray-white matter differentiation remains normal. No ventriculomegaly or intraventricular hemorrhage. No other acute intracranial hemorrhage identified. No cortically based  acute infarct identified. Chronic brainstem and cerebellar volume loss. Vascular: Calcified atherosclerosis at the skull base. Skull: Calvarium intact.  Visible facial bones appear intact. Sinuses/Orbits: Mild bubbly opacity in a posterior right ethmoid air cell. Other Visualized paranasal sinuses and mastoids are stable and well pneumatized. Other: Broad-based right forehead scalp hematoma measuring up to 7 mm. Orbits soft tissues remain normal. CT CERVICAL SPINE FINDINGS Alignment: Mild degenerative appearing anterolisthesis of C4 on C5. Associated right side facet degeneration. Relatively preserved cervical lordosis otherwise. Bilateral posterior element alignment is within normal limits. Cervicothoracic junction alignment is within normal limits. Skull base and vertebrae: Visualized skull base is intact. No atlanto-occipital dissociation. Partially calcified degenerative ligamentous hypertrophy about the odontoid. No acute cervical spine fracture identified. Soft tissues and spinal canal: No prevertebral fluid or swelling. No visible canal hematoma. Partially retropharyngeal course of both carotids. Otherwise negative noncontrast neck soft tissues. Disc levels: Advanced cervical disc and endplate degeneration, worst at C5-C6 and C6-C7. Upper chest: Grossly intact visible upper thoracic levels. There is possibly a healed fracture of the posterior left first rib. Negative lung apices. Calcified aortic atherosclerosis. Other: None. IMPRESSION: 1. Small volume right anterior cranial fossa Subarachnoid Hemorrhage. Small focal right anterior Subdural Hematoma. 2. No associated mass effect. 3. No definite associated right inferior frontal gyrus contusion, but consider a repeat head CT (e.g. in 12-24 hours) as contusions can be occult initially. 4. No associated skull fracture. Right frontal scalp soft tissue injury. 5. No acute fracture or listhesis identified in the cervical spine. Ligamentous injury is not excluded.  6. Critical Value/emergent results were called by telephone at the time of interpretation on 05/25/2016 at 1:41 pm to Dr. Montine Circle , who verbally acknowledged these results. Electronically Signed   By: Genevie Ann M.D.   On: 05/25/2016 13:43     Medications:  Scheduled: . amLODipine  2.5 mg Oral Daily  . atorvastatin  40 mg Oral QHS  . carbamazepine  200 mg Oral BID  . FLUoxetine  20 mg Oral Daily  . gabapentin  300 mg Oral BID  . metoprolol tartrate  25 mg Oral BID  . senna  1 tablet Oral BID  . sodium chloride flush  3 mL Intravenous Q12H  . sodium chloride flush  3 mL Intravenous Q12H  . traMADol-acetaminophen  1 tablet Oral BID   Continuous:  [id number] chloride, acetaminophen **OR** acetaminophen, HYDROcodone-acetaminophen, ondansetron **OR** ondansetron (ZOFRAN) IV, polyethylene glycol, sodium chloride flush  Assessment/Plan:  Principal Problem:   Subarachnoid hemorrhage (HCC) Active Problems:   HTN (hypertension)   Subdural hematoma (HCC)   SAH (subarachnoid hemorrhage) (HCC)   Laceration of face-right supraorbital    Subarachnoid hemorrhage/ Subdural hematoma  Ration seems to be stable from a neurological standpoint. Her headache is improved. Repeat CT scan is pending as recommended by neurosurgery. Do not anticipate she will need any surgical intervention. Pain control. Holding aspirin. She is wheelchair bound due to her ataxia. Anticipate she will be able to return to her assisted living facility tomorrow.  Right supraorbital laceration and eye swelling Stable. Laceration was sutured.  Fall Patient is wheelchair bound. No  other injuries identified on imaging studies. No significant limitation noted in range of motion of her extremities.  Leukocytosis Normal today. Was likely a stress response.  Dehydration Was gently hydrated. Okay to stop IV fluids.  Essential HTN (hypertension) Continue amlodipine and metoprolol with holding  parameters  Hyperlipidemia Lipitor  Depression Prozac  Neuropathy Neurontin  Left-sided nephrectomy for renal cancer Stable  DVT Prophylaxis: SCDs    Code Status: DO NOT RESUSCITATE  Family Communication: No family at bedside  Disposition Plan: Await repeat CT scan. If stable findings, she can be moved to the floor. And then anticipate discharge tomorrow. PT evaluation to see if she can safely transfer to and from wheelchair.    LOS: 0 days   [facility] Pager [phone] 05/26/2016, 11:04 AM  If 7PM-7AM, please contact night-coverage at [email or web address], password [facility]

## 2016-05-26 NOTE — Clinical Social Work Note (Signed)
Clinical Social Work Assessment  Patient Details  Name: Wendy Erickson MRN: [id number] Date of Birth: 1940-10-15  Date of referral:  05/26/16               Reason for consult:  Facility Placement                Permission sought to share information with:  Facility Art therapist granted to share information::  Yes, Verbal Permission Granted  Name::        Agency::     Relationship::     Contact Information:     Housing/Transportation Living arrangements for the past 2 months:  [place] of Information:  Patient, Facility Patient Interpreter Needed:  None Criminal Activity/Legal Involvement Pertinent to Current Situation/Hospitalization:  No - Comment as needed Significant Relationships:  Adult Children Lives with:  Facility Resident Do you feel safe going back to the place where you live?  Yes Need for family participation in patient care:  Yes (Comment)  Care giving concerns:  No concerns reported at this time.   Social Worker assessment / plan:  Pt hospitalized on 05/25/16 from [facility] with a subarachnoid hemorrhage after falling out of her w/c at [facility]. CSW met with pt to assist with d/c planning. Pt plans to return to SNF at d/c. Pt gave CSW permission to contact her son, [name] [phone]. Message left for son. Awaiting return call. Clinicals have been sent to SNF for review. [facility] will readmit pt when stable for d/c. Pt has Copy / medicaid. CSW will request authorization from insurance. Pt is able to return to SNF under medicaid if [employer] denies authorization. CSW will continue to follow to assist with d/c planning needs. Employment status:  Retired Forensic scientist:    PT Recommendations:  [place] / Referral to community resources:  [place]  Patient/Family's Response to care:  No family at bedside. Pt plans to return to SNF at  d/c.  Patient/Family's Understanding of and Emotional Response to Diagnosis, Current Treatment, and Prognosis:  Pt is aware of her medical status. Pt is motivated to work with therapy. Pt appreciates CSW assistance with d/c planning back to SNF.  Emotional Assessment Appearance:    Attitude/Demeanor/Rapport:  Other (cooperative) Affect (typically observed):  Calm, Appropriate Orientation:  Oriented to Self, Oriented to Place, Oriented to  Time, Oriented to Situation Alcohol / Substance use:  Not Applicable Psych involvement (Current and /or in the community):  No (Comment)  Discharge Needs  Concerns to be addressed:  Discharge Planning Concerns Readmission within the last 30 days:    Current discharge risk:  None Barriers to Discharge:  No Barriers Identified   Luretha Rued, [place] 05/26/2016, 4:23 PM

## 2016-05-27 MED ORDER — METOPROLOL TARTRATE 25 MG PO TABS: 12.5000 mg | ORAL_TABLET | Freq: Two times a day (BID) | ORAL | Status: DC

## 2016-05-27 MED ORDER — HYDROCODONE-ACETAMINOPHEN 5-325 MG PO TABS: 1.0000 | ORAL_TABLET | Freq: Four times a day (QID) | ORAL | 0 refills | Status: DC | PRN

## 2016-05-27 NOTE — Progress Notes (Signed)
Attempted to call nursing report to RN at [facility] [phone]; left voice mail with call back number.

## 2016-05-27 NOTE — Care Management Note (Signed)
Case Management Note  Patient Details  Name: Wendy Erickson MRN: [id number] Date of Birth: 08-14-1940  Subjective/Objective: 75 y/o f admitted w/Subarachnoid Hemorrhage. From SNF-[facility]-CSW already following.                   Action/Plan:d/c plan SNF.   Expected Discharge Date:   (unknown)               Expected Discharge Plan:  Skilled Nursing Facility  In-House Referral:  Clinical Social Work  Discharge planning Services     Post Acute Care Choice:    Choice offered to:     DME Arranged:    DME Agency:     HH Arranged:    [place] Agency:     Status of Service:  In process, will continue to follow  If discussed at Long Length of Stay Meetings, dates discussed:    Additional Comments:  Dessa Phi, RN 05/27/2016, 9:23 AM

## 2016-05-27 NOTE — Progress Notes (Signed)
Pt is ready to return to [facility] ( SNF ) today. Pt / son are in agreement with this plan. D/C Summary has been sent to SNF for review. Scripts included in d/c packet. # for report provided to nsg. PTAR transport required. Medical necessity form completed. [employer] medicare contacted and authorization for Skilled placement and ambulance authorization requested. Peer to peer review requested. Attending will speak with medical director. Pt is able to return to SNF under her medicaid benefits if [employer] does not provided approval.  Werner Lean LCSW [phone]

## 2016-05-27 NOTE — NC FL2 (Signed)
[place] LEVEL OF CARE SCREENING TOOL     IDENTIFICATION  Patient Name: Wendy Erickson Birthdate: [birth year] Sex: female Admission Date (Current Location): 05/25/2016  [facility] and [place] Number:  Herbalist and Address:  [facility],  [place] [address] [place]      Provider Number: [id number]  Attending Physician Name and Address:  Bonnielee Haff, MD  Relative Name and Phone Number:       Current Level of Care: Hospital Recommended Level of Care: [place] Prior Approval Number:    Date Approved/Denied:   PASRR Number:    Discharge Plan: SNF    Current Diagnoses: Patient Active Problem List   Diagnosis Date Noted  . Fall   . Subarachnoid hemorrhage ([place]) 05/25/2016  . Subdural hematoma ([place]) 05/25/2016  . SAH (subarachnoid hemorrhage) ([place]) 05/25/2016  . Laceration of face-right supraorbital 05/25/2016  . Trigeminal neuralgia of right side of face 06/09/2015  . Abnormality of gait 12/05/2014  . Dysphagia, pharyngoesophageal phase 12/05/2014  . Cholelithiasis 05/14/2014  . Abdominal pain, epigastric 05/14/2014  . Systolic murmur 0000000  . Chest pain 05/11/2014  . Atypical chest pain 05/11/2014  . 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 Hydronephrosis of right kidney 04/13/2013  . Absent kidney, acquired 04/13/2013  . Pyelonephritis 04/11/2013  . AKI (acute kidney injury) ([place]) 04/11/2013  . HTN (hypertension) 04/11/2013    Orientation RESPIRATION BLADDER Height & Weight     Self, Time, Situation, Place  Normal Continent Weight: 160 lb (72.6 kg) Height:  5\' 3"  (160 cm)  BEHAVIORAL SYMPTOMS/MOOD NEUROLOGICAL BOWEL NUTRITION STATUS  Other (Comment) (no behaviors)   Continent Diet  AMBULATORY STATUS COMMUNICATION OF NEEDS Skin   Extensive Assist Verbally Other (Comment) (abrasions on foot / toe, ecchymosis right / left eye)                       Personal Care Assistance  Level of Assistance  Bathing, Feeding, Dressing Bathing Assistance: Limited assistance   Dressing Assistance: Limited assistance     Functional Limitations Info  Sight, Hearing, Speech Sight Info: Adequate Hearing Info: Adequate Speech Info: Adequate    SPECIAL CARE FACTORS FREQUENCY  PT (By licensed PT), OT (By licensed OT)     PT Frequency: 5x wk OT Frequency: 5x wk            Contractures Contractures Info: Not present    Additional Factors Info  Code Status, Allergies Code Status Info: DNR             Current Medications (05/27/2016):  This is the current hospital active medication list Current Facility-Administered Medications  Medication Dose Route Frequency Provider Last Rate Last Dose  . 0.9 %  sodium chloride infusion  250 mL Intravenous PRN Debbe Odea, MD      . acetaminophen (TYLENOL) tablet 650 mg  650 mg Oral Q6H PRN Debbe Odea, MD       Or  . acetaminophen (TYLENOL) suppository 650 mg  650 mg Rectal Q6H PRN Debbe Odea, MD      . amLODipine (NORVASC) tablet 2.5 mg  2.5 mg Oral Daily Debbe Odea, MD   2.5 mg at 05/26/16 0919  . atorvastatin (LIPITOR) tablet 40 mg  40 mg Oral QHS Debbe Odea, MD   40 mg at 05/26/16 2237  . carbamazepine (TEGRETOL) tablet 200 mg  200 mg Oral BID Debbe Odea, MD   200 mg at 05/26/16 2257  .  FLUoxetine (PROZAC) capsule 20 mg  20 mg Oral Daily Debbe Odea, MD   20 mg at 05/26/16 0919  . gabapentin (NEURONTIN) capsule 300 mg  300 mg Oral BID Debbe Odea, MD   300 mg at 05/26/16 2237  . HYDROcodone-acetaminophen (NORCO/VICODIN) 5-325 MG per tablet 2 tablet  2 tablet Oral Q6H PRN Debbe Odea, MD   2 tablet at 05/27/16 0345  . metoprolol tartrate (LOPRESSOR) tablet 12.5 mg  12.5 mg Oral BID Bonnielee Haff, MD      . ondansetron [facility]) tablet 4 mg  4 mg Oral Q6H PRN Debbe Odea, MD       Or  . ondansetron (ZOFRAN) injection 4 mg  4 mg Intravenous Q6H PRN Debbe Odea, MD   4 mg at 05/25/16 2226  . polyethylene  glycol (MIRALAX / GLYCOLAX) packet 17 g  17 g Oral Daily PRN Debbe Odea, MD      . senna (SENOKOT) tablet 8.6 mg  1 tablet Oral BID Debbe Odea, MD   8.6 mg at 05/26/16 2237  . sodium chloride flush (NS) 0.9 % injection 3 mL  3 mL Intravenous Q12H Debbe Odea, MD   3 mL at 05/26/16 2258  . traMADol-acetaminophen (ULTRACET) 37.5-325 MG per tablet 1 tablet  1 tablet Oral BID Debbe Odea, MD   1 tablet at 05/26/16 2237     Discharge Medications: Please see discharge summary for a list of discharge medications.  Relevant Imaging Results:  Relevant Lab Results:   Additional Information SS # [id number]  Shade Rivenbark, Randall An, LCSW

## 2016-05-27 NOTE — Discharge Summary (Signed)
[facility]  Physician Discharge Summary   Patient ID: Wendy Erickson MRN: [id number] DOB/AGE: 08-28-1940 75 y.o.  Admit date: 05/25/2016 Discharge date: 05/27/2016  PCP: Mathews Argyle, MD  DISCHARGE DIAGNOSES:  Principal Problem:   Subarachnoid hemorrhage ([place]) Active Problems:   HTN (hypertension)   Subdural hematoma ([facility])   SAH (subarachnoid hemorrhage) ([facility])   Laceration of face-right supraorbital   Fall   RECOMMENDATIONS FOR OUTPATIENT FOLLOW UP: 1. Follow-up with neurosurgery, Dr. Joya Salm in 2-3 weeks. 2. Hold aspirin for 2 weeks. 3. 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 She was found to have subarachnoid hemorrhage and subdural hematoma. She was hospitalized for further management.  Consultations:  Dr. Joya Salm, neurosurgery  Procedures:  None  HOSPITAL COURSE:   Subarachnoid hemorrhage/Subdural hematoma  Patient has been stable from a neurological standpoint. Her headache is improving. Patient was seen by neurosurgery. Repeat CT scan was recommended, which was done and shows stable findings. Discussed with Dr. Joya Salm today. He recommends follow-up in his office in 2-3 weeks. No need for any other intervention. Continue to hold aspirin for now. She is wheelchair bound due to her ataxia. She was seen by physical therapy and they are recommending skilled level of care for now.   Right supraorbital laceration and eye swelling Stable. Laceration was sutured. Eye swelling is slowly improving.  Patient and her son were reassured.  Fall Patient is wheelchair bound. No other injuries identified on imaging studies. No significant limitation noted in range of motion of her extremities.  Leukocytosis Subsequently normal. Was likely a stress response.  Dehydration Was gently hydrated.  Essential HTN (hypertension) Dose of metoprolol was reduced likely due to bradycardia. Continue amlodipine. Continue to monitor blood pressures.  Hyperlipidemia Continue Lipitor  Depression Continue Prozac  Neuropathy Continue Neurontin  Left-sided nephrectomy for renal cancer Stable  Overall improved. Neurologically stable. Okay for discharge to skilled nursing facility today.  PERTINENT LABS:  The results of significant diagnostics from this hospitalization (including imaging, microbiology, ancillary and laboratory) are listed below for reference.    Microbiology: Recent Results (from the past 240 hour(s))  MRSA PCR Screening     Status: None   Collection Time: 05/25/16  5:17 PM  Result Value Ref Range Status   MRSA by PCR NEGATIVE NEGATIVE Final    Comment:        The GeneXpert MRSA Assay (FDA approved for NASAL specimens only), is one component of a comprehensive MRSA colonization surveillance program. It is not intended to diagnose MRSA infection nor to guide or monitor treatment for MRSA infections.      Labs: Basic Metabolic Panel:  Recent Labs Lab 05/25/16 1405 05/26/16 0358  NA 139 139  K 4.2 4.5  CL 100* 104  CO2 29 27  GLUCOSE 91 101*  BUN 27* 32*  CREATININE 0.90 0.99  CALCIUM 9.9 9.1   CBC:  Recent Labs Lab 05/25/16 1405 05/26/16 0358  WBC 14.0* 9.2  NEUTROABS 12.0*  --   HGB 13.4 11.8*  HCT 41.2 36.2  MCV 91.6 91.6  PLT 324 308     IMAGING  STUDIES Dg Chest 2 View  Result Date: 05/25/2016 CLINICAL DATA:  Fall EXAM: CHEST  2 VIEW COMPARISON:  12/23/2015 FINDINGS: Cardiomediastinal silhouette is stable. No acute infiltrate or  pleural effusion. No pulmonary edema. Stable bilateral basilar atelectasis or scarring. Mild degenerative changes and osteopenia thoracic spine. Stable mild compression deformity upper lumbar spine. IMPRESSION: No active cardiopulmonary disease. Electronically Signed   By: Lahoma Crocker M.D.   On: 05/25/2016 13:37   Ct Head Wo Contrast  Result Date: 05/26/2016 CLINICAL DATA:  Recent fall with subarachnoid hemorrhage EXAM: CT HEAD WITHOUT CONTRAST TECHNIQUE: Contiguous axial images were obtained from the base of the skull through the vertex without intravenous contrast. COMPARISON:  May 25, 2016 FINDINGS: Brain: There is mild diffuse atrophy, slightly greater in the high frontal regions compared to other areas, stable. There is stable moderate cerebellar atrophy. The previously noted focus of subarachnoid hemorrhage in the anterior inferior frontal lobe on the right in the anterior cranial fossa region is stable. There is no appreciable mass effect from this small amount of subarachnoid hemorrhage. The anterior subdural hematoma noted on the right medially is marginally smaller with a maximum thickness of 5 mm, extending over a distance of slightly less than 2 cm. There is subtle hemorrhage in the right inferior frontal gyrus region, felt to have been present on the previous study but marginally better seen currently. No new areas of hemorrhage identified. No new extra-axial fluid. No midline shift. Vascular: There are no hyperdense vessels. There is calcification in each carotid siphon region. Skull: The bony calvarium appears intact. There is increase in soft tissue edema over the preseptal right orbit and upper right facial regions without fracture. Inferior right frontal scalp hematoma also is larger. Sinuses/Orbits: Visualized paranasal sinuses are clear. Visualized orbits appear symmetric bilaterally. Other: Mastoid air cells are clear. IMPRESSION: Stable small area of subarachnoid hemorrhage in the  lateral right anterior cranial fossa in the inferior right frontal lobe region with nearby inferior frontal gyrus hematoma, felt within present previously but slightly better seen. There is equivocal early edema in this area, best appreciated on axial slice 10 series 2. The small nearly isodense medial right subdural hematoma is slightly smaller. No progression of extra-axial fluid. No new foci of acute hemorrhage. Underlying atrophy stable. Atrophy is most notable in the frontal lobes bilaterally in the cerebellar hemispheres bilaterally. Atherosclerotic calcification in the carotid siphon regions. Increase in soft tissue edema over the upper right face and preseptal right orbit. There is also increase in scalp hematoma over the right frontal region inferiorly. No fracture evident. No intraorbital lesions appreciable. Electronically Signed   By: Lowella Grip III M.D.   On: 05/26/2016 14:27   Ct Head Wo Contrast  Result Date: 05/25/2016 CLINICAL DATA:  75 year old female who fell at home. Right frontal facial laceration. Initial encounter. EXAM: CT HEAD WITHOUT CONTRAST CT CERVICAL SPINE WITHOUT CONTRAST TECHNIQUE: Multidetector CT imaging of the head and cervical spine was performed following the standard protocol without intravenous contrast. Multiplanar CT image reconstructions of the cervical spine were also generated. COMPARISON:  Head CTs without contrast 05/13/2014 and earlier. FINDINGS: CT HEAD FINDINGS Brain: Trace subarachnoid hemorrhage in the right anterior cranial fossa. Very small focal right anterior isodense subdural hematoma, measuring up to 5 mm thick in an area of about 2 cm. No definite right inferior frontal gyrus contusion at this time. Gray-white matter differentiation remains normal. No ventriculomegaly or intraventricular hemorrhage. No other acute intracranial hemorrhage identified. No cortically based acute  infarct identified. Chronic brainstem and cerebellar volume loss.  Vascular: Calcified atherosclerosis at the skull base. Skull: Calvarium intact.  Visible facial bones appear intact. Sinuses/Orbits: Mild bubbly opacity in a posterior right ethmoid air cell. Other Visualized paranasal sinuses and mastoids are stable and well pneumatized. Other: Broad-based right forehead scalp hematoma measuring up to 7 mm. Orbits soft tissues remain normal. CT CERVICAL SPINE FINDINGS Alignment: Mild degenerative appearing anterolisthesis of C4 on C5. Associated right side facet degeneration. Relatively preserved cervical lordosis otherwise. Bilateral posterior element alignment is within normal limits. Cervicothoracic junction alignment is within normal limits. Skull base and vertebrae: Visualized skull base is intact. No atlanto-occipital dissociation. Partially calcified degenerative ligamentous hypertrophy about the odontoid. No acute cervical spine fracture identified. Soft tissues and spinal canal: No prevertebral fluid or swelling. No visible canal hematoma. Partially retropharyngeal course of both carotids. Otherwise negative noncontrast neck soft tissues. Disc levels: Advanced cervical disc and endplate degeneration, worst at C5-C6 and C6-C7. Upper chest: Grossly intact visible upper thoracic levels. There is possibly a healed fracture of the posterior left first rib. Negative lung apices. Calcified aortic atherosclerosis. Other: None. IMPRESSION: 1. Small volume right anterior cranial fossa Subarachnoid Hemorrhage. Small focal right anterior Subdural Hematoma. 2. No associated mass effect. 3. No definite associated right inferior frontal gyrus contusion, but consider a repeat head CT (e.g. in 12-24 hours) as contusions can be occult initially. 4. No associated skull fracture. Right frontal scalp soft tissue injury. 5. No acute fracture or listhesis identified in the cervical spine. Ligamentous injury is not excluded. 6. Critical Value/emergent results were called by telephone at the time  of interpretation on 05/25/2016 at 1:41 pm to Dr. Montine Circle , who verbally acknowledged these results. Electronically Signed   By: Genevie Ann M.D.   On: 05/25/2016 13:43   Ct Cervical Spine Wo Contrast  Result Date: 05/25/2016 CLINICAL DATA:  75 year old female who fell at home. Right frontal facial laceration. Initial encounter. EXAM: CT HEAD WITHOUT CONTRAST CT CERVICAL SPINE WITHOUT CONTRAST TECHNIQUE: Multidetector CT imaging of the head and cervical spine was performed following the standard protocol without intravenous contrast. Multiplanar CT image reconstructions of the cervical spine were also generated. COMPARISON:  Head CTs without contrast 05/13/2014 and earlier. FINDINGS: CT HEAD FINDINGS Brain: Trace subarachnoid hemorrhage in the right anterior cranial fossa. Very small focal right anterior isodense subdural hematoma, measuring up to 5 mm thick in an area of about 2 cm. No definite right inferior frontal gyrus contusion at this time. Gray-white matter differentiation remains normal. No ventriculomegaly or intraventricular hemorrhage. No other acute intracranial hemorrhage identified. No cortically based acute infarct identified. Chronic brainstem and cerebellar volume loss. Vascular: Calcified atherosclerosis at the skull base. Skull: Calvarium intact.  Visible facial bones appear intact. Sinuses/Orbits: Mild bubbly opacity in a posterior right ethmoid air cell. Other Visualized paranasal sinuses and mastoids are stable and well pneumatized. Other: Broad-based right forehead scalp hematoma measuring up to 7 mm. Orbits soft tissues remain normal. CT CERVICAL SPINE FINDINGS Alignment: Mild degenerative appearing anterolisthesis of C4 on C5. Associated right side facet degeneration. Relatively preserved cervical lordosis otherwise. Bilateral posterior element alignment is within normal limits. Cervicothoracic junction alignment is within normal limits. Skull base and vertebrae: Visualized skull  base is intact. No atlanto-occipital dissociation. Partially calcified degenerative ligamentous hypertrophy about the odontoid. No acute cervical spine fracture identified. Soft tissues and spinal canal: No prevertebral fluid or swelling. No visible canal hematoma. Partially retropharyngeal course of both carotids. Otherwise negative noncontrast  neck soft tissues. Disc levels: Advanced cervical disc and endplate degeneration, worst at C5-C6 and C6-C7. Upper chest: Grossly intact visible upper thoracic levels. There is possibly a healed fracture of the posterior left first rib. Negative lung apices. Calcified aortic atherosclerosis. Other: None. IMPRESSION: 1. Small volume right anterior cranial fossa Subarachnoid Hemorrhage. Small focal right anterior Subdural Hematoma. 2. No associated mass effect. 3. No definite associated right inferior frontal gyrus contusion, but consider a repeat head CT (e.g. in 12-24 hours) as contusions can be occult initially. 4. No associated skull fracture. Right frontal scalp soft tissue injury. 5. No acute fracture or listhesis identified in the cervical spine. Ligamentous injury is not excluded. 6. Critical Value/emergent results were called by telephone at the time of interpretation on 05/25/2016 at 1:41 pm to Dr. Montine Circle , who verbally acknowledged these results. Electronically Signed   By: Genevie Ann M.D.   On: 05/25/2016 13:43    DISCHARGE EXAMINATION: Vitals:   05/26/16 1730 05/26/16 2130 05/27/16 0538 05/27/16 0928  BP: (!) 160/81 127/85 (!) 163/74 (!) 157/51  Pulse: (!) 59 77 65 68  Resp: 14 18 18    Temp: 98.9 F (37.2 C) 99.3 F (37.4 C) 98.2 F (36.8 C)   TempSrc: Oral Oral Oral   SpO2: 94% 93% 90%   Weight:      Height:       General appearance: alert, cooperative, appears stated age and no distress Skin continues to have significant swelling around her right eye, although it's less than yesterday. Able to partially open her right eye as well. Left  eye is normal. She's got a laceration over her right eyebrow which has been sutured. Resp: clear to auscultation bilaterally Cardio: regular rate and rhythm, S1, S2 normal, no murmur, click, rub or gallop GI: soft, non-tender; bowel sounds normal; no masses,  no organomegaly Extremities: extremities normal, atraumatic, no cyanosis or edema  DISPOSITION: SNF  Discharge Instructions    Call MD for:  difficulty breathing, headache or visual disturbances    Complete by:  As directed    Call MD for:  extreme fatigue    Complete by:  As directed    Call MD for:  persistant dizziness or light-headedness    Complete by:  As directed    Call MD for:  persistant nausea and vomiting    Complete by:  As directed    Call MD for:  severe uncontrolled pain    Complete by:  As directed    Call MD for:  temperature >100.4    Complete by:  As directed    Discharge instructions    Complete by:  As directed    Hold aspirin for 2 weeks. Follow-up with Dr. Joya Salm, neurosurgeon, in 2-3 weeks. Follow-up with PCP in one week as recommended.  You were cared for by a hospitalist during your hospital stay. If you have any questions about your discharge medications or the care you received while you were in the hospital after you are discharged, you can call the unit and asked to speak with the hospitalist on call if the hospitalist that took care of you is not available. Once you are discharged, your primary care physician will handle any further medical issues. Please note that NO REFILLS for any discharge medications will be authorized once you are discharged, as it is imperative that you return to your primary care physician (or establish a relationship with a primary care physician if you do not have one) for  your aftercare needs so that they can reassess your need for medications and monitor your lab values. If you do not have a primary care physician, you can call [phone] for a physician referral.   Increase  activity slowly    Complete by:  As directed       ALLERGIES:  Allergies  Allergen Reactions  . Pineapple      Current Discharge Medication List    CONTINUE these medications which have CHANGED   Details  HYDROcodone-acetaminophen (NORCO/VICODIN) 5-325 MG tablet Take 1-2 tablets by mouth every 6 (six) hours as needed for moderate pain. Qty: 30 tablet, Refills: 0    metoprolol tartrate (LOPRESSOR) 25 MG tablet Take 0.5 tablets (12.5 mg total) by mouth 2 (two) times daily.      CONTINUE these medications which have NOT CHANGED   Details  acetaminophen (TYLENOL) 325 MG tablet Take 650 mg by mouth every 4 (four) hours as needed for moderate pain or fever (max 10 in 24 hours).    amLODipine (NORVASC) 2.5 MG tablet Take 2.5 mg by mouth daily.    atorvastatin (LIPITOR) 40 MG tablet Take 40 mg by mouth at bedtime.     Calcium Carbonate-Vitamin D (CALCARB 600/D) 600-400 MG-UNIT per tablet Take 1 tablet by mouth every morning.     carbamazepine (TEGRETOL) 200 MG tablet Take 1 tablet (200 mg total) by mouth 2 (two) times daily. Qty: 60 tablet, Refills: 5    chlorhexidine (PERIDEX) 0.12 % solution Use as directed 15 mLs in the mouth or throat 2 (two) times daily. Swish and spit    cholecalciferol (VITAMIN D) 1000 UNITS tablet Take 2,000 Units by mouth every morning.    FLUoxetine (PROZAC) 20 MG capsule Take 20 mg by mouth every morning.     gabapentin (NEURONTIN) 300 MG capsule Take 300 mg by mouth 2 (two) times daily.     polycarbophil (FIBERCON) 625 MG tablet Take 625 mg by mouth 2 (two) times daily.     polyethylene glycol (MIRALAX) packet Take 17 g by mouth daily as needed for moderate constipation. Qty: 10 each, Refills: 0    senna (SENOKOT) 8.6 MG tablet Take 1 tablet by mouth 2 (two) times daily.       STOP taking these medications     acetaminophen (TYLENOL) 650 MG CR tablet      aspirin 81 MG tablet      Influenza Virus Vaccine Split (FLUZONE IM)       traMADol-acetaminophen (ULTRACET) 37.5-325 MG per tablet          Follow-up Information    Mathews Argyle, MD. Schedule an appointment as soon as possible for a visit in 1 week(s).   Specialty:  Internal Medicine Contact information: [address] [place] [address] [phone]        Floyce Stakes, MD. Schedule an appointment as soon as possible for a visit in 3 week(s).   Specialty:  Neurosurgery Contact information: [address] [place] [address] [phone]           TOTAL DISCHARGE TIME: 35 minutes  [facility]  [facility] Pager [phone]  05/27/2016, 11:37 AM

## 2016-05-29 DIAGNOSIS — I6200 Nontraumatic subdural hemorrhage, unspecified: Secondary | ICD-10-CM | POA: Diagnosis not present

## 2016-05-29 DIAGNOSIS — S01111A Laceration without foreign body of right eyelid and periocular area, initial encounter: Secondary | ICD-10-CM | POA: Diagnosis not present

## 2016-05-29 DIAGNOSIS — L609 Nail disorder, unspecified: Secondary | ICD-10-CM | POA: Diagnosis not present

## 2016-05-30 DIAGNOSIS — Z9181 History of falling: Secondary | ICD-10-CM | POA: Diagnosis not present

## 2016-05-30 DIAGNOSIS — R278 Other lack of coordination: Secondary | ICD-10-CM | POA: Diagnosis not present

## 2016-05-30 DIAGNOSIS — M6281 Muscle weakness (generalized): Secondary | ICD-10-CM | POA: Diagnosis not present

## 2016-06-02 DIAGNOSIS — I609 Nontraumatic subarachnoid hemorrhage, unspecified: Secondary | ICD-10-CM | POA: Diagnosis not present

## 2016-06-08 DIAGNOSIS — N390 Urinary tract infection, site not specified: Secondary | ICD-10-CM | POA: Diagnosis not present

## 2016-06-08 DIAGNOSIS — Z79899 Other long term (current) drug therapy: Secondary | ICD-10-CM | POA: Diagnosis not present

## 2016-06-08 DIAGNOSIS — M6281 Muscle weakness (generalized): Secondary | ICD-10-CM | POA: Diagnosis not present

## 2016-06-08 DIAGNOSIS — R319 Hematuria, unspecified: Secondary | ICD-10-CM | POA: Diagnosis not present

## 2016-06-08 DIAGNOSIS — R278 Other lack of coordination: Secondary | ICD-10-CM | POA: Diagnosis not present

## 2016-06-08 DIAGNOSIS — Z9181 History of falling: Secondary | ICD-10-CM | POA: Diagnosis not present

## 2016-06-09 DIAGNOSIS — N390 Urinary tract infection, site not specified: Secondary | ICD-10-CM | POA: Diagnosis not present

## 2016-06-21 ENCOUNTER — Other Ambulatory Visit: Payer: Self-pay | Admitting: Geriatric Medicine

## 2016-06-21 DIAGNOSIS — S066X0D Traumatic subarachnoid hemorrhage without loss of consciousness, subsequent encounter: Secondary | ICD-10-CM

## 2016-06-21 DIAGNOSIS — R319 Hematuria, unspecified: Secondary | ICD-10-CM | POA: Diagnosis not present

## 2016-06-21 DIAGNOSIS — N390 Urinary tract infection, site not specified: Secondary | ICD-10-CM | POA: Diagnosis not present

## 2016-06-21 DIAGNOSIS — Z79899 Other long term (current) drug therapy: Secondary | ICD-10-CM | POA: Diagnosis not present

## 2016-06-29 ENCOUNTER — Ambulatory Visit
Admission: RE | Admit: 2016-06-29 | Discharge: 2016-06-29 | Disposition: A | Discharge status: Home / Self Care | Payer: Medicare Other | Source: Ambulatory Visit | Attending: Geriatric Medicine | Admitting: Geriatric Medicine

## 2016-06-29 DIAGNOSIS — S066X0D Traumatic subarachnoid hemorrhage without loss of consciousness, subsequent encounter: Secondary | ICD-10-CM

## 2016-06-29 DIAGNOSIS — S066X0A Traumatic subarachnoid hemorrhage without loss of consciousness, initial encounter: Secondary | ICD-10-CM | POA: Diagnosis not present

## 2016-07-04 DIAGNOSIS — I6200 Nontraumatic subdural hemorrhage, unspecified: Secondary | ICD-10-CM | POA: Diagnosis not present

## 2016-07-04 DIAGNOSIS — I10 Essential (primary) hypertension: Secondary | ICD-10-CM | POA: Diagnosis not present

## 2016-07-14 DIAGNOSIS — I10 Essential (primary) hypertension: Secondary | ICD-10-CM | POA: Diagnosis not present

## 2016-07-14 DIAGNOSIS — E118 Type 2 diabetes mellitus with unspecified complications: Secondary | ICD-10-CM | POA: Diagnosis not present

## 2016-07-27 ENCOUNTER — Ambulatory Visit (INDEPENDENT_AMBULATORY_CARE_PROVIDER_SITE_OTHER): Payer: Medicare Other | Admitting: Adult Health

## 2016-07-27 ENCOUNTER — Encounter: Payer: Self-pay | Admitting: Adult Health

## 2016-07-27 VITALS — BP 184/81 | HR 54 | Ht 63.0 in

## 2016-07-27 DIAGNOSIS — G500 Trigeminal neuralgia: Secondary | ICD-10-CM

## 2016-07-27 DIAGNOSIS — G118 Other hereditary ataxias: Secondary | ICD-10-CM | POA: Diagnosis not present

## 2016-07-27 NOTE — Progress Notes (Signed)
PATIENT: Wendy Erickson DOB: 1941/04/11  REASON FOR VISIT: follow up- spinocerebellar Ataxia type. HISTORY FROM: patient  HISTORY OF PRESENT ILLNESS: Wendy Erickson is a 75 year old female with a history of spinocerebellar ataxia type III. She returns today for follow-up. She continues to live at [facility]. She does require assistance with most ADLs. She is not ambulatory. She uses a wheelchair at all times. She is currently taking carbamazepine for trigeminal neuralgia. She denies any facial pain. She states that she did have a fall recently. Unfortunately she did suffer a subarachnoid hemorrhage and subdural hematoma. She was evaluated by Dr. Joya Salm in the hospital. She was discharged back to [facility]. She recently had a follow-up with Dr.Botero and a repeat CT scan was completed by Dr. Felipa Eth. The CT scan in November showed resolution of the hemorrhage. The patient reports that since the fall she has been having trouble with her vision. She reports that she also has 2 other siblings with spinocerebellar ataxia type III. She returns today for an evaluation.  HISTORY 06/09/15:Wendy Erickson is a 75 year old left-handed white female with a history of spinocerebellar ataxia type III. The patient resides in an extended care facility, she requires assistance for most activities of daily living. She can feed herself, but she requires assistance with bathing and dressing. The patient has a right-sided trigeminal neuralgia, in the V1 distribution. She is on carbamazepine for this, the pain seems to be fairly well controlled. She has a peripheral neuropathy with bilateral foot drops, some discomfort in the feet. She has reported some dysphagia, she was sent to speech therapy for a modified barium swallow when last seen, no erythema was recommended for the swallowing. The patient recently has had bilateral cataract surgery. She transfers with the use of a Hoyer lift, she has not had any falls or injury since last  seen. She has 2 siblings with spinocerebellar ataxia as well.   REVIEW OF SYSTEMS: Out of a complete 14 system review of symptoms, the patient complains only of the following symptoms, and all other reviewed systems are negative.  Appetite change, chills, fatigue, leg swelling, chest tightness, eye pain, double vision, eye itching, heat intolerance, swollen abdomen, abdominal pain, constipation, insomnia, daytime sleepiness, neck stiffness, neck pain, walking difficulty, muscle cramps, aching muscles, back pain, joint pain, joint swelling, painful urination, difficulty urinating, dizziness, headache, numbness, speech difficulty, weakness, tremors, agitation, depression, nervous/anxious  ALLERGIES: Allergies  Allergen Reactions  . Pineapple     HOME MEDICATIONS: Outpatient Medications Prior to Visit  Medication Sig Dispense Refill  . acetaminophen (TYLENOL) 325 MG tablet Take 650 mg by mouth every 4 (four) hours as needed for moderate pain or fever (max 10 in 24 hours).    Marland Kitchen amLODipine (NORVASC) 2.5 MG tablet Take 2.5 mg by mouth daily.    Marland Kitchen atorvastatin (LIPITOR) 40 MG tablet Take 40 mg by mouth at bedtime.     . Calcium Carbonate-Vitamin D (CALCARB 600/D) 600-400 MG-UNIT per tablet Take 1 tablet by mouth every morning.     . carbamazepine (TEGRETOL) 200 MG tablet Take 1 tablet (200 mg total) by mouth 2 (two) times daily. 60 tablet 5  . chlorhexidine (PERIDEX) 0.12 % solution Use as directed 15 mLs in the mouth or throat 2 (two) times daily. Swish and spit    . cholecalciferol (VITAMIN D) 1000 UNITS tablet Take 2,000 Units by mouth every morning.    Marland Kitchen FLUoxetine (PROZAC) 20 MG capsule Take 20 mg by mouth every morning.     Marland Kitchen  gabapentin (NEURONTIN) 300 MG capsule Take 300 mg by mouth 2 (two) times daily.     Marland Kitchen HYDROcodone-acetaminophen (NORCO/VICODIN) 5-325 MG tablet Take 1-2 tablets by mouth every 6 (six) hours as needed for moderate pain. 30 tablet 0  . metoprolol tartrate (LOPRESSOR) 25 MG  tablet Take 0.5 tablets (12.5 mg total) by mouth 2 (two) times daily.    . polycarbophil (FIBERCON) 625 MG tablet Take 625 mg by mouth 2 (two) times daily.     . polyethylene glycol (MIRALAX) packet Take 17 g by mouth daily as needed for moderate constipation. 10 each 0  . senna (SENOKOT) 8.6 MG tablet Take 1 tablet by mouth 2 (two) times daily.      No facility-administered medications prior to visit.     PAST MEDICAL HISTORY: Past Medical History:  Diagnosis Date  . Abnormality of gait 12/05/2014  . Arthritis    "knees" (05/14/2014)  . 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)   . Chronic kidney disease    left renal neoplasm  . Depression   . Dysphagia, pharyngoesophageal phase 12/05/2014  . Foot fracture, right   . Gait disorder   . Heart murmur   . High cholesterol   . History of gout   . Hypertension   . Kidney malignancy ([place])    left renal neoplasm  . Osteopenia   . Peripheral neuropathy ([place])   . SCA-3 (spinocerebellar ataxia type 3) ([place]) 05/13/2013  . Shingles    Right V1 distribution  . Trigeminal neuralgia of right side of face 06/09/2015   V1 distribution    PAST SURGICAL HISTORY: Past Surgical History:  Procedure Laterality Date  . BACK SURGERY    . CATARACT EXTRACTION W/ INTRAOCULAR LENS IMPLANT Left   . DILATION AND CURETTAGE OF UTERUS    . KNEE ARTHROSCOPY Bilateral   . LAPAROSCOPIC NEPHRECTOMY  10/06/2011   Procedure: LAPAROSCOPIC NEPHRECTOMY;  Surgeon: Dutch Gray, MD;  Location: [facility];  Service: Urology;  Laterality: Left;  LEFT LAPAROSCOPIC RADICAL NEPHRECTOMY   . LUMBAR DISC SURGERY     "herniated disc"  . TONSILLECTOMY    . VAGINAL HYSTERECTOMY      FAMILY HISTORY: Family History  Problem Relation Age of Onset  . Ataxia Father   . Dementia Mother     SOCIAL HISTORY: Social History   Social History  . Marital status: Single    Spouse name: N/A  . Number of children: 1  . Years of education: college 2   Occupational History  .  retired Retired   Social History Main Topics  . Smoking status: Former Smoker    Packs/day: 1.00    Years: 40.00    Types: Cigarettes    Quit date: 08/08/2005  . Smokeless tobacco: Never Used  . Alcohol use No     Comment: wine on Friday  . Drug use: No  . Sexual activity: No   Other Topics Concern  . Not on file   Social History Narrative   Patient is left handed.   Patient does not drink caffeine.   Resides at [facility].      PHYSICAL EXAM  Vitals:   07/27/16 0903  BP: (!) 184/81  Pulse: (!) 54  Height: 5\' 3"  (1.6 m)   There is no height or weight on file to calculate BMI.  Generalized: Well developed, in no acute distress   Neurological examination  Mentation: Alert oriented to time, place, history taking. Follows all commands . Speech is  slightly ataxic. Cranial nerve II-XII: Pupils were equal round reactive to light. Extraocular movements were full, visual field were full on confrontational test. Nystagmus noted with horizontal gaze. Facial sensation and strength were normal. Uvula tongue midline. Head turning and shoulder shrug  were normal and symmetric. Motor: The motor testing reveals 5 over 5 strength of all 4 extremities. Good symmetric motor tone is noted throughout.  Sensory: Sensory testing is intact to soft touch on all 4 extremities. No evidence of extinction is noted.  Coordination: Cerebellar testing reveals ataxia with finger-nose-finger and heel-to-shin bilaterally.  Gait and station: Wheelchair bound DIAGNOSTIC DATA (LABS, IMAGING, TESTING) - I reviewed patient records, labs, notes, testing and imaging myself where available.  Lab Results  Component Value Date   WBC 9.2 05/26/2016   HGB 11.8 (L) 05/26/2016   HCT 36.2 05/26/2016   MCV 91.6 05/26/2016   PLT 308 05/26/2016      Component Value Date/Time   NA 139 05/26/2016 0358   NA 147 (H) 12/05/2014 1135   K 4.5 05/26/2016 0358   CL 104 05/26/2016 0358   CO2 27 05/26/2016  0358   GLUCOSE 101 (H) 05/26/2016 0358   BUN 32 (H) 05/26/2016 0358   BUN 24 12/05/2014 1135   CREATININE 0.99 05/26/2016 0358   CALCIUM 9.1 05/26/2016 0358   PROT 6.4 12/05/2014 1135   ALBUMIN 3.9 12/05/2014 1135   AST 15 12/05/2014 1135   ALT 11 12/05/2014 1135   ALKPHOS 71 12/05/2014 1135   BILITOT <0.2 12/05/2014 1135   GFRNONAA 55 (L) 05/26/2016 0358   GFRAA >60 05/26/2016 0358      ASSESSMENT AND PLAN 75 y.o. 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 here with:  1. Spinocerebellar ataxia type III 2. trigeminal neuralgia  Overall the patient has remained stable. She will continue on carbamazepine for trigeminal neuralgia. Patient is advised that if her vision worsens or she develops new symptoms she should let the staff at [facility] know. She verbalized understanding. She will follow-up in 6 months or sooner if needed.      Ward Givens, MSN, NP-C 07/27/2016, 11:53 AM [facility] [address], [place], [place] [address] [phone]

## 2016-07-27 NOTE — Progress Notes (Signed)
I have read the note, and I agree with the clinical assessment and plan.  WILLIS,CHARLES KEITH   

## 2016-07-27 NOTE — Patient Instructions (Signed)
Continue Carbamazepine for trigeminal neuralgia If vision worsens or you develop a headache or other symptoms please let the MD at the facility know.  If your symptoms worsen or you develop new symptoms please let us know.

## 2016-08-30 DIAGNOSIS — R319 Hematuria, unspecified: Secondary | ICD-10-CM | POA: Diagnosis not present

## 2016-08-30 DIAGNOSIS — N390 Urinary tract infection, site not specified: Secondary | ICD-10-CM | POA: Diagnosis not present

## 2016-08-30 DIAGNOSIS — Z79899 Other long term (current) drug therapy: Secondary | ICD-10-CM | POA: Diagnosis not present

## 2016-09-12 DIAGNOSIS — E7800 Pure hypercholesterolemia, unspecified: Secondary | ICD-10-CM | POA: Diagnosis not present

## 2016-09-12 DIAGNOSIS — G119 Hereditary ataxia, unspecified: Secondary | ICD-10-CM | POA: Diagnosis not present

## 2016-09-14 DIAGNOSIS — D649 Anemia, unspecified: Secondary | ICD-10-CM | POA: Diagnosis not present

## 2016-09-14 DIAGNOSIS — E785 Hyperlipidemia, unspecified: Secondary | ICD-10-CM | POA: Diagnosis not present

## 2016-10-14 DIAGNOSIS — R569 Unspecified convulsions: Secondary | ICD-10-CM | POA: Diagnosis not present

## 2016-11-07 DIAGNOSIS — R278 Other lack of coordination: Secondary | ICD-10-CM | POA: Diagnosis not present

## 2016-11-14 DIAGNOSIS — G119 Hereditary ataxia, unspecified: Secondary | ICD-10-CM | POA: Diagnosis not present

## 2016-11-14 DIAGNOSIS — J029 Acute pharyngitis, unspecified: Secondary | ICD-10-CM | POA: Diagnosis not present

## 2016-11-18 DIAGNOSIS — I10 Essential (primary) hypertension: Secondary | ICD-10-CM | POA: Diagnosis not present

## 2016-11-18 DIAGNOSIS — D649 Anemia, unspecified: Secondary | ICD-10-CM | POA: Diagnosis not present

## 2016-11-18 DIAGNOSIS — R109 Unspecified abdominal pain: Secondary | ICD-10-CM | POA: Diagnosis not present

## 2016-11-21 ENCOUNTER — Other Ambulatory Visit: Payer: Self-pay | Admitting: Geriatric Medicine

## 2016-11-21 DIAGNOSIS — R1084 Generalized abdominal pain: Secondary | ICD-10-CM

## 2016-11-23 DIAGNOSIS — R278 Other lack of coordination: Secondary | ICD-10-CM | POA: Diagnosis not present

## 2016-11-25 ENCOUNTER — Ambulatory Visit
Admission: RE | Admit: 2016-11-25 | Discharge: 2016-11-25 | Disposition: A | Discharge status: Home / Self Care | Payer: Medicare Other | Source: Ambulatory Visit | Attending: Geriatric Medicine | Admitting: Geriatric Medicine

## 2016-11-25 DIAGNOSIS — R109 Unspecified abdominal pain: Secondary | ICD-10-CM | POA: Diagnosis not present

## 2016-11-25 DIAGNOSIS — R1084 Generalized abdominal pain: Secondary | ICD-10-CM

## 2016-11-25 MED ORDER — IOPAMIDOL (ISOVUE-300) INJECTION 61%
100.0000 mL | Freq: Once | INTRAVENOUS | Status: AC | PRN
  Administered 2016-11-25: 100 mL via INTRAVENOUS

## 2016-12-08 DIAGNOSIS — R278 Other lack of coordination: Secondary | ICD-10-CM | POA: Diagnosis not present

## 2016-12-08 DIAGNOSIS — M6281 Muscle weakness (generalized): Secondary | ICD-10-CM | POA: Diagnosis not present

## 2016-12-08 DIAGNOSIS — E119 Type 2 diabetes mellitus without complications: Secondary | ICD-10-CM | POA: Diagnosis not present

## 2016-12-29 DIAGNOSIS — R278 Other lack of coordination: Secondary | ICD-10-CM | POA: Diagnosis not present

## 2016-12-30 DIAGNOSIS — E119 Type 2 diabetes mellitus without complications: Secondary | ICD-10-CM | POA: Diagnosis not present

## 2016-12-30 DIAGNOSIS — M6281 Muscle weakness (generalized): Secondary | ICD-10-CM | POA: Diagnosis not present

## 2016-12-30 DIAGNOSIS — R278 Other lack of coordination: Secondary | ICD-10-CM | POA: Diagnosis not present

## 2017-01-02 DIAGNOSIS — R278 Other lack of coordination: Secondary | ICD-10-CM | POA: Diagnosis not present

## 2017-01-03 DIAGNOSIS — R278 Other lack of coordination: Secondary | ICD-10-CM | POA: Diagnosis not present

## 2017-01-04 DIAGNOSIS — R278 Other lack of coordination: Secondary | ICD-10-CM | POA: Diagnosis not present

## 2017-01-05 DIAGNOSIS — E119 Type 2 diabetes mellitus without complications: Secondary | ICD-10-CM | POA: Diagnosis not present

## 2017-01-05 DIAGNOSIS — R278 Other lack of coordination: Secondary | ICD-10-CM | POA: Diagnosis not present

## 2017-01-05 DIAGNOSIS — M6281 Muscle weakness (generalized): Secondary | ICD-10-CM | POA: Diagnosis not present

## 2017-01-06 DIAGNOSIS — R278 Other lack of coordination: Secondary | ICD-10-CM | POA: Diagnosis not present

## 2017-01-06 DIAGNOSIS — M6281 Muscle weakness (generalized): Secondary | ICD-10-CM | POA: Diagnosis not present

## 2017-01-06 DIAGNOSIS — E119 Type 2 diabetes mellitus without complications: Secondary | ICD-10-CM | POA: Diagnosis not present

## 2017-01-10 DIAGNOSIS — E119 Type 2 diabetes mellitus without complications: Secondary | ICD-10-CM | POA: Diagnosis not present

## 2017-01-10 DIAGNOSIS — R278 Other lack of coordination: Secondary | ICD-10-CM | POA: Diagnosis not present

## 2017-01-10 DIAGNOSIS — M6281 Muscle weakness (generalized): Secondary | ICD-10-CM | POA: Diagnosis not present

## 2017-01-11 DIAGNOSIS — E119 Type 2 diabetes mellitus without complications: Secondary | ICD-10-CM | POA: Diagnosis not present

## 2017-01-11 DIAGNOSIS — M6281 Muscle weakness (generalized): Secondary | ICD-10-CM | POA: Diagnosis not present

## 2017-01-11 DIAGNOSIS — R278 Other lack of coordination: Secondary | ICD-10-CM | POA: Diagnosis not present

## 2017-01-12 DIAGNOSIS — E119 Type 2 diabetes mellitus without complications: Secondary | ICD-10-CM | POA: Diagnosis not present

## 2017-01-12 DIAGNOSIS — M6281 Muscle weakness (generalized): Secondary | ICD-10-CM | POA: Diagnosis not present

## 2017-01-12 DIAGNOSIS — R278 Other lack of coordination: Secondary | ICD-10-CM | POA: Diagnosis not present

## 2017-01-16 DIAGNOSIS — E119 Type 2 diabetes mellitus without complications: Secondary | ICD-10-CM | POA: Diagnosis not present

## 2017-01-16 DIAGNOSIS — R278 Other lack of coordination: Secondary | ICD-10-CM | POA: Diagnosis not present

## 2017-01-16 DIAGNOSIS — M6281 Muscle weakness (generalized): Secondary | ICD-10-CM | POA: Diagnosis not present

## 2017-01-18 DIAGNOSIS — R05 Cough: Secondary | ICD-10-CM | POA: Diagnosis not present

## 2017-01-18 DIAGNOSIS — R062 Wheezing: Secondary | ICD-10-CM | POA: Diagnosis not present

## 2017-01-20 ENCOUNTER — Other Ambulatory Visit (HOSPITAL_COMMUNITY): Payer: Self-pay | Admitting: Urology

## 2017-01-20 ENCOUNTER — Ambulatory Visit (HOSPITAL_COMMUNITY)
Admission: RE | Admit: 2017-01-20 | Discharge: 2017-01-20 | Disposition: A | Discharge status: Home / Self Care | Payer: Medicare Other | Source: Ambulatory Visit | Attending: Urology | Admitting: Urology

## 2017-01-20 DIAGNOSIS — J984 Other disorders of lung: Secondary | ICD-10-CM | POA: Diagnosis not present

## 2017-01-20 DIAGNOSIS — Z85528 Personal history of other malignant neoplasm of kidney: Secondary | ICD-10-CM | POA: Insufficient documentation

## 2017-01-20 DIAGNOSIS — I700 Atherosclerosis of aorta: Secondary | ICD-10-CM | POA: Diagnosis not present

## 2017-01-20 DIAGNOSIS — C642 Malignant neoplasm of left kidney, except renal pelvis: Secondary | ICD-10-CM | POA: Diagnosis not present

## 2017-01-20 DIAGNOSIS — J9811 Atelectasis: Secondary | ICD-10-CM | POA: Diagnosis not present

## 2017-01-20 DIAGNOSIS — R8271 Bacteriuria: Secondary | ICD-10-CM | POA: Diagnosis not present

## 2017-01-24 DIAGNOSIS — E119 Type 2 diabetes mellitus without complications: Secondary | ICD-10-CM | POA: Diagnosis not present

## 2017-01-24 DIAGNOSIS — M6281 Muscle weakness (generalized): Secondary | ICD-10-CM | POA: Diagnosis not present

## 2017-01-24 DIAGNOSIS — R278 Other lack of coordination: Secondary | ICD-10-CM | POA: Diagnosis not present

## 2017-01-31 ENCOUNTER — Ambulatory Visit (INDEPENDENT_AMBULATORY_CARE_PROVIDER_SITE_OTHER): Payer: Medicare Other | Admitting: Neurology

## 2017-01-31 ENCOUNTER — Encounter: Payer: Self-pay | Admitting: Neurology

## 2017-01-31 VITALS — BP 177/74 | HR 50

## 2017-01-31 DIAGNOSIS — G500 Trigeminal neuralgia: Secondary | ICD-10-CM

## 2017-01-31 DIAGNOSIS — G118 Other hereditary ataxias: Secondary | ICD-10-CM | POA: Diagnosis not present

## 2017-01-31 NOTE — Progress Notes (Signed)
Reason for visit: Cerebellar ataxia  Wendy Erickson is an 76 y.o. female  History of present illness:  Wendy Erickson is a 76 year old left-handed white female with a history of spinocerebellar ataxia type III. The patient has severe ataxia of all 4 extremities, she has nystagmus and double vision. The patient does report some problems with swallowing liquids, this is an occasional problem and is not that frequent. Her main issue is that she has problems feeding herself, she does better with finger food. The patient has a history of trigeminal neuralgia, she is on carbamazepine for this with only an occasional twinge of pain. She has a peripheral neuropathy with bilateral foot drops, she has numbness in the feet and she is on gabapentin for the discomfort. She is not ambulatory, she will stand on occasion with physical therapy. She returns to the office today for an evaluation.  Past Medical History:  Diagnosis Date  . Abnormality of gait 12/05/2014  . Arthritis    "knees" (05/14/2014)  . 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 Chronic kidney disease    left renal neoplasm  . Depression   . Dysphagia, pharyngoesophageal phase 12/05/2014  . Foot fracture, right   . Gait disorder   . Heart murmur   . High cholesterol   . History of gout   . Hypertension   . Kidney malignancy ([place])    left renal neoplasm  . Osteopenia   . Peripheral neuropathy   . SCA-3 (spinocerebellar ataxia type 3) ([place]) 05/13/2013  . Shingles    Right V1 distribution  . Trigeminal neuralgia of right side of face 06/09/2015   V1 distribution    Past Surgical History:  Procedure Laterality Date  . BACK SURGERY    . CATARACT EXTRACTION W/ INTRAOCULAR LENS IMPLANT Left   . DILATION AND CURETTAGE OF UTERUS    . KNEE ARTHROSCOPY Bilateral   . LAPAROSCOPIC NEPHRECTOMY  10/06/2011   Procedure: LAPAROSCOPIC NEPHRECTOMY;  Surgeon: Dutch Gray, MD;  Location: [facility];  Service: Urology;  Laterality: Left;  LEFT  LAPAROSCOPIC RADICAL NEPHRECTOMY   . LUMBAR DISC SURGERY     "herniated disc"  . TONSILLECTOMY    . VAGINAL HYSTERECTOMY      Family History  Problem Relation Age of Onset  . Ataxia Father   . Dementia Mother     Social history:  reports that she quit smoking about 11 years ago. Her smoking use included Cigarettes. She has a 40.00 pack-year smoking history. She has never used smokeless tobacco. She reports that she does not drink alcohol or use drugs.    Allergies  Allergen Reactions  . Pineapple     Medications:  Prior to Admission medications   Medication Sig Start Date End Date Taking? Authorizing Provider  acetaminophen (TYLENOL) 325 MG tablet Take 650 mg by mouth every 4 (four) hours as needed for moderate pain or fever (max 10 in 24 hours).   Yes [provider]  amLODipine (NORVASC) 2.5 MG tablet Take 2.5 mg by mouth daily.   Yes [provider]  aspirin EC 81 MG tablet Take 81 mg by mouth daily.   Yes   atorvastatin (LIPITOR) 40 MG tablet Take 40 mg by mouth at bedtime.    Yes [provider]  Calcium Carbonate-Vitamin D (CALCARB 600/D) 600-400 MG-UNIT per tablet Take 1 tablet by mouth every morning.    Yes [provider]  carbamazepine (TEGRETOL) 200 MG tablet Take 1 tablet (200 mg  total) by mouth 2 (two) times daily. 06/05/14  Yes Kathrynn Ducking, MD  chlorhexidine (PERIDEX) 0.12 % solution Use as directed 15 mLs in the mouth or throat 2 (two) times daily. Swish and spit   Yes [provider]  cholecalciferol (VITAMIN D) 1000 UNITS tablet Take 2,000 Units by mouth every morning.   Yes [provider]  FLUoxetine (PROZAC) 20 MG capsule Take 20 mg by mouth every morning.    Yes [provider]  gabapentin (NEURONTIN) 300 MG capsule Take 300 mg by mouth 2 (two) times daily.    Yes [provider]  HYDROcodone-acetaminophen (NORCO/VICODIN) 5-325 MG tablet Take 1-2 tablets by mouth every 6 (six) hours  as needed for moderate pain. 05/27/16  Yes Bonnielee Haff, MD  metoprolol tartrate (LOPRESSOR) 25 MG tablet Take 0.5 tablets (12.5 mg total) by mouth 2 (two) times daily. 05/27/16  Yes Bonnielee Haff, MD  polycarbophil (FIBERCON) 625 MG tablet Take 625 mg by mouth 2 (two) times daily.    Yes [provider]  polyethylene glycol (MIRALAX) packet Take 17 g by mouth daily as needed for moderate constipation. 05/14/14  Yes Domenic Polite, MD  senna (SENOKOT) 8.6 MG tablet Take 1 tablet by mouth 2 (two) times daily.    Yes [provider]    ROS:  Out of a complete 14 system review of symptoms, the patient complains only of the following symptoms, and all other reviewed systems are negative.  Difficulty swallowing Gait disturbance  Blood pressure (!) 177/74, pulse (!) 50, SpO2 95 %.  Physical Exam  General: The patient is alert and cooperative at the time of the examination.  Skin: 1+ edema at the feet is noted.   Neurologic Exam  Mental status: The patient is alert and oriented x 3 at the time of the examination. The patient has apparent normal recent and remote memory, with an apparently normal attention span and concentration ability.   Cranial nerves: Facial symmetry is present. Speech is normal, no aphasia or dysarthria is noted. Extraocular movements are full, but coarse nystagmus is seen bilaterally. Visual fields are full.  Motor: The patient has good strength in all 4 extremities, with exception of prominent bilateral foot drops.  Sensory examination: Soft touch sensation is symmetric on the face, arms, and legs, but in general there is decreased sensation below the knees bilaterally.  Coordination: The patient has severe ataxia with finger-nose-finger and heel-to-shin bilaterally.  Gait and station: The patient is wheelchair-bound, gait was not attempted  Reflexes: Deep tendon reflexes are symmetric, but are depressed.   Assessment/Plan:  1.  Spinocerebellar ataxia type III  2. Gait disorder  3. Trigeminal neuralgia  4. Peripheral neuropathy, bilateral foot drops  The patient is essentially nonambulatory. She mobilizes in a wheelchair, she also has a motorized wheelchair that she uses independently. The patient is doing well with her peripheral neuropathy discomfort and with her trigeminal neuralgia. She will remain on carbamazepine. She has had recent blood work done that shows a normal liver profile and a CBC that was unremarkable. The blood work was done on 11/18/2016. She will follow-up in one year, sooner if needed. If the dysphagia worsens, she is to contact our office.  Jill Alexanders MD 01/31/2017 10:31 AM  [facility] [address] [place], [place] [address]  Phone [phone] Fax [phone]

## 2017-02-27 DIAGNOSIS — I872 Venous insufficiency (chronic) (peripheral): Secondary | ICD-10-CM | POA: Diagnosis not present

## 2017-02-27 DIAGNOSIS — H6123 Impacted cerumen, bilateral: Secondary | ICD-10-CM | POA: Diagnosis not present

## 2017-03-10 DIAGNOSIS — H6123 Impacted cerumen, bilateral: Secondary | ICD-10-CM | POA: Diagnosis not present

## 2017-04-10 DIAGNOSIS — R293 Abnormal posture: Secondary | ICD-10-CM | POA: Diagnosis not present

## 2017-04-10 DIAGNOSIS — M25511 Pain in right shoulder: Secondary | ICD-10-CM | POA: Diagnosis not present

## 2017-04-10 DIAGNOSIS — M25512 Pain in left shoulder: Secondary | ICD-10-CM | POA: Diagnosis not present

## 2017-04-10 DIAGNOSIS — R278 Other lack of coordination: Secondary | ICD-10-CM | POA: Diagnosis not present

## 2017-04-10 DIAGNOSIS — G3281 Cerebellar ataxia in diseases classified elsewhere: Secondary | ICD-10-CM | POA: Diagnosis not present

## 2017-05-01 DIAGNOSIS — R293 Abnormal posture: Secondary | ICD-10-CM | POA: Diagnosis not present

## 2017-05-01 DIAGNOSIS — R278 Other lack of coordination: Secondary | ICD-10-CM | POA: Diagnosis not present

## 2017-05-01 DIAGNOSIS — M25511 Pain in right shoulder: Secondary | ICD-10-CM | POA: Diagnosis not present

## 2017-05-01 DIAGNOSIS — G3281 Cerebellar ataxia in diseases classified elsewhere: Secondary | ICD-10-CM | POA: Diagnosis not present

## 2017-05-01 DIAGNOSIS — M25512 Pain in left shoulder: Secondary | ICD-10-CM | POA: Diagnosis not present

## 2017-05-08 DIAGNOSIS — C649 Malignant neoplasm of unspecified kidney, except renal pelvis: Secondary | ICD-10-CM | POA: Diagnosis not present

## 2017-05-08 DIAGNOSIS — R269 Unspecified abnormalities of gait and mobility: Secondary | ICD-10-CM | POA: Diagnosis not present

## 2017-05-08 DIAGNOSIS — I10 Essential (primary) hypertension: Secondary | ICD-10-CM | POA: Diagnosis not present

## 2017-05-08 DIAGNOSIS — G3281 Cerebellar ataxia in diseases classified elsewhere: Secondary | ICD-10-CM | POA: Diagnosis not present

## 2017-05-18 DIAGNOSIS — F331 Major depressive disorder, recurrent, moderate: Secondary | ICD-10-CM | POA: Diagnosis not present

## 2017-05-18 DIAGNOSIS — F419 Anxiety disorder, unspecified: Secondary | ICD-10-CM | POA: Diagnosis not present

## 2017-05-25 DIAGNOSIS — F419 Anxiety disorder, unspecified: Secondary | ICD-10-CM | POA: Diagnosis not present

## 2017-05-25 DIAGNOSIS — F331 Major depressive disorder, recurrent, moderate: Secondary | ICD-10-CM | POA: Diagnosis not present

## 2017-06-05 DIAGNOSIS — M6281 Muscle weakness (generalized): Secondary | ICD-10-CM | POA: Diagnosis not present

## 2017-06-05 DIAGNOSIS — G3281 Cerebellar ataxia in diseases classified elsewhere: Secondary | ICD-10-CM | POA: Diagnosis not present

## 2017-06-05 DIAGNOSIS — I10 Essential (primary) hypertension: Secondary | ICD-10-CM | POA: Diagnosis not present

## 2017-06-05 DIAGNOSIS — G894 Chronic pain syndrome: Secondary | ICD-10-CM | POA: Diagnosis not present

## 2017-06-07 DIAGNOSIS — R569 Unspecified convulsions: Secondary | ICD-10-CM | POA: Diagnosis not present

## 2017-06-08 DIAGNOSIS — F419 Anxiety disorder, unspecified: Secondary | ICD-10-CM | POA: Diagnosis not present

## 2017-06-08 DIAGNOSIS — F331 Major depressive disorder, recurrent, moderate: Secondary | ICD-10-CM | POA: Diagnosis not present

## 2017-06-13 DIAGNOSIS — F332 Major depressive disorder, recurrent severe without psychotic features: Secondary | ICD-10-CM | POA: Diagnosis not present

## 2017-06-15 DIAGNOSIS — F331 Major depressive disorder, recurrent, moderate: Secondary | ICD-10-CM | POA: Diagnosis not present

## 2017-06-15 DIAGNOSIS — F419 Anxiety disorder, unspecified: Secondary | ICD-10-CM | POA: Diagnosis not present

## 2017-06-15 DIAGNOSIS — G629 Polyneuropathy, unspecified: Secondary | ICD-10-CM | POA: Diagnosis not present

## 2017-06-15 DIAGNOSIS — G894 Chronic pain syndrome: Secondary | ICD-10-CM | POA: Diagnosis not present

## 2017-06-22 DIAGNOSIS — F331 Major depressive disorder, recurrent, moderate: Secondary | ICD-10-CM | POA: Diagnosis not present

## 2017-06-22 DIAGNOSIS — F419 Anxiety disorder, unspecified: Secondary | ICD-10-CM | POA: Diagnosis not present

## 2017-07-06 DIAGNOSIS — I10 Essential (primary) hypertension: Secondary | ICD-10-CM | POA: Diagnosis not present

## 2017-07-06 DIAGNOSIS — E568 Deficiency of other vitamins: Secondary | ICD-10-CM | POA: Diagnosis not present

## 2017-07-06 DIAGNOSIS — M6281 Muscle weakness (generalized): Secondary | ICD-10-CM | POA: Diagnosis not present

## 2017-07-06 DIAGNOSIS — G3281 Cerebellar ataxia in diseases classified elsewhere: Secondary | ICD-10-CM | POA: Diagnosis not present

## 2017-07-13 DIAGNOSIS — H903 Sensorineural hearing loss, bilateral: Secondary | ICD-10-CM | POA: Diagnosis not present

## 2017-07-13 DIAGNOSIS — F419 Anxiety disorder, unspecified: Secondary | ICD-10-CM | POA: Diagnosis not present

## 2017-07-13 DIAGNOSIS — F331 Major depressive disorder, recurrent, moderate: Secondary | ICD-10-CM | POA: Diagnosis not present

## 2017-07-18 DIAGNOSIS — G894 Chronic pain syndrome: Secondary | ICD-10-CM | POA: Diagnosis not present

## 2017-07-18 DIAGNOSIS — G629 Polyneuropathy, unspecified: Secondary | ICD-10-CM | POA: Diagnosis not present

## 2017-07-26 DIAGNOSIS — F332 Major depressive disorder, recurrent severe without psychotic features: Secondary | ICD-10-CM | POA: Diagnosis not present

## 2017-07-26 DIAGNOSIS — G4700 Insomnia, unspecified: Secondary | ICD-10-CM | POA: Diagnosis not present

## 2017-07-27 DIAGNOSIS — F419 Anxiety disorder, unspecified: Secondary | ICD-10-CM | POA: Diagnosis not present

## 2017-07-27 DIAGNOSIS — F331 Major depressive disorder, recurrent, moderate: Secondary | ICD-10-CM | POA: Diagnosis not present

## 2017-07-31 DIAGNOSIS — N644 Mastodynia: Secondary | ICD-10-CM | POA: Diagnosis not present

## 2017-08-03 DIAGNOSIS — F331 Major depressive disorder, recurrent, moderate: Secondary | ICD-10-CM | POA: Diagnosis not present

## 2017-08-03 DIAGNOSIS — F419 Anxiety disorder, unspecified: Secondary | ICD-10-CM | POA: Diagnosis not present

## 2017-08-09 DIAGNOSIS — F332 Major depressive disorder, recurrent severe without psychotic features: Secondary | ICD-10-CM | POA: Diagnosis not present

## 2017-08-09 DIAGNOSIS — G4700 Insomnia, unspecified: Secondary | ICD-10-CM | POA: Diagnosis not present

## 2017-08-10 DIAGNOSIS — F331 Major depressive disorder, recurrent, moderate: Secondary | ICD-10-CM | POA: Diagnosis not present

## 2017-08-10 DIAGNOSIS — F419 Anxiety disorder, unspecified: Secondary | ICD-10-CM | POA: Diagnosis not present

## 2017-08-17 DIAGNOSIS — K5900 Constipation, unspecified: Secondary | ICD-10-CM | POA: Diagnosis not present

## 2017-08-17 DIAGNOSIS — G3281 Cerebellar ataxia in diseases classified elsewhere: Secondary | ICD-10-CM | POA: Diagnosis not present

## 2017-08-17 DIAGNOSIS — R21 Rash and other nonspecific skin eruption: Secondary | ICD-10-CM | POA: Diagnosis not present

## 2017-08-17 DIAGNOSIS — M6281 Muscle weakness (generalized): Secondary | ICD-10-CM | POA: Diagnosis not present

## 2017-08-30 DIAGNOSIS — L299 Pruritus, unspecified: Secondary | ICD-10-CM | POA: Diagnosis not present

## 2017-08-30 DIAGNOSIS — R21 Rash and other nonspecific skin eruption: Secondary | ICD-10-CM | POA: Diagnosis not present

## 2017-08-31 DIAGNOSIS — F331 Major depressive disorder, recurrent, moderate: Secondary | ICD-10-CM | POA: Diagnosis not present

## 2017-08-31 DIAGNOSIS — F419 Anxiety disorder, unspecified: Secondary | ICD-10-CM | POA: Diagnosis not present

## 2017-09-19 ENCOUNTER — Encounter: Payer: Self-pay | Admitting: Adult Health

## 2017-09-19 DIAGNOSIS — E785 Hyperlipidemia, unspecified: Secondary | ICD-10-CM | POA: Diagnosis not present

## 2017-09-19 DIAGNOSIS — I10 Essential (primary) hypertension: Secondary | ICD-10-CM | POA: Diagnosis not present

## 2017-09-19 DIAGNOSIS — G4700 Insomnia, unspecified: Secondary | ICD-10-CM | POA: Diagnosis not present

## 2017-09-19 DIAGNOSIS — M6281 Muscle weakness (generalized): Secondary | ICD-10-CM | POA: Diagnosis not present

## 2017-09-20 DIAGNOSIS — R319 Hematuria, unspecified: Secondary | ICD-10-CM | POA: Diagnosis not present

## 2017-09-20 DIAGNOSIS — R300 Dysuria: Secondary | ICD-10-CM | POA: Diagnosis not present

## 2017-09-21 ENCOUNTER — Encounter: Payer: Self-pay | Admitting: Adult Health

## 2017-09-21 DIAGNOSIS — F419 Anxiety disorder, unspecified: Secondary | ICD-10-CM | POA: Diagnosis not present

## 2017-09-21 DIAGNOSIS — F331 Major depressive disorder, recurrent, moderate: Secondary | ICD-10-CM | POA: Diagnosis not present

## 2017-09-22 DIAGNOSIS — M6281 Muscle weakness (generalized): Secondary | ICD-10-CM | POA: Diagnosis not present

## 2017-09-22 DIAGNOSIS — E785 Hyperlipidemia, unspecified: Secondary | ICD-10-CM | POA: Diagnosis not present

## 2017-09-22 DIAGNOSIS — G4700 Insomnia, unspecified: Secondary | ICD-10-CM | POA: Diagnosis not present

## 2017-09-22 DIAGNOSIS — I10 Essential (primary) hypertension: Secondary | ICD-10-CM | POA: Diagnosis not present

## 2017-10-05 DIAGNOSIS — F419 Anxiety disorder, unspecified: Secondary | ICD-10-CM | POA: Diagnosis not present

## 2017-10-05 DIAGNOSIS — F331 Major depressive disorder, recurrent, moderate: Secondary | ICD-10-CM | POA: Diagnosis not present

## 2017-10-12 DIAGNOSIS — F419 Anxiety disorder, unspecified: Secondary | ICD-10-CM | POA: Diagnosis not present

## 2017-10-12 DIAGNOSIS — F331 Major depressive disorder, recurrent, moderate: Secondary | ICD-10-CM | POA: Diagnosis not present

## 2017-10-17 DIAGNOSIS — R34 Anuria and oliguria: Secondary | ICD-10-CM | POA: Diagnosis not present

## 2017-10-17 DIAGNOSIS — G629 Polyneuropathy, unspecified: Secondary | ICD-10-CM | POA: Diagnosis not present

## 2017-10-17 DIAGNOSIS — G3281 Cerebellar ataxia in diseases classified elsewhere: Secondary | ICD-10-CM | POA: Diagnosis not present

## 2017-10-17 DIAGNOSIS — C649 Malignant neoplasm of unspecified kidney, except renal pelvis: Secondary | ICD-10-CM | POA: Diagnosis not present

## 2017-10-18 DIAGNOSIS — R34 Anuria and oliguria: Secondary | ICD-10-CM | POA: Diagnosis not present

## 2017-10-18 DIAGNOSIS — R319 Hematuria, unspecified: Secondary | ICD-10-CM | POA: Diagnosis not present

## 2017-10-19 DIAGNOSIS — F331 Major depressive disorder, recurrent, moderate: Secondary | ICD-10-CM | POA: Diagnosis not present

## 2017-10-19 DIAGNOSIS — F419 Anxiety disorder, unspecified: Secondary | ICD-10-CM | POA: Diagnosis not present

## 2017-10-20 DIAGNOSIS — N390 Urinary tract infection, site not specified: Secondary | ICD-10-CM | POA: Diagnosis not present

## 2017-10-25 DIAGNOSIS — F331 Major depressive disorder, recurrent, moderate: Secondary | ICD-10-CM | POA: Diagnosis not present

## 2017-10-25 DIAGNOSIS — G4700 Insomnia, unspecified: Secondary | ICD-10-CM | POA: Diagnosis not present

## 2017-11-02 DIAGNOSIS — F419 Anxiety disorder, unspecified: Secondary | ICD-10-CM | POA: Diagnosis not present

## 2017-11-02 DIAGNOSIS — N390 Urinary tract infection, site not specified: Secondary | ICD-10-CM | POA: Diagnosis not present

## 2017-11-02 DIAGNOSIS — F331 Major depressive disorder, recurrent, moderate: Secondary | ICD-10-CM | POA: Diagnosis not present

## 2017-11-07 DIAGNOSIS — L603 Nail dystrophy: Secondary | ICD-10-CM | POA: Diagnosis not present

## 2017-11-07 DIAGNOSIS — Q845 Enlarged and hypertrophic nails: Secondary | ICD-10-CM | POA: Diagnosis not present

## 2017-11-07 DIAGNOSIS — I739 Peripheral vascular disease, unspecified: Secondary | ICD-10-CM | POA: Diagnosis not present

## 2017-11-08 DIAGNOSIS — G4700 Insomnia, unspecified: Secondary | ICD-10-CM | POA: Diagnosis not present

## 2017-11-08 DIAGNOSIS — F331 Major depressive disorder, recurrent, moderate: Secondary | ICD-10-CM | POA: Diagnosis not present

## 2017-11-09 DIAGNOSIS — N390 Urinary tract infection, site not specified: Secondary | ICD-10-CM | POA: Diagnosis not present

## 2017-11-09 DIAGNOSIS — R319 Hematuria, unspecified: Secondary | ICD-10-CM | POA: Diagnosis not present

## 2017-11-10 DIAGNOSIS — N390 Urinary tract infection, site not specified: Secondary | ICD-10-CM | POA: Diagnosis not present

## 2017-11-15 DIAGNOSIS — E785 Hyperlipidemia, unspecified: Secondary | ICD-10-CM | POA: Diagnosis not present

## 2017-11-15 DIAGNOSIS — E568 Deficiency of other vitamins: Secondary | ICD-10-CM | POA: Diagnosis not present

## 2017-11-15 DIAGNOSIS — M6281 Muscle weakness (generalized): Secondary | ICD-10-CM | POA: Diagnosis not present

## 2017-11-15 DIAGNOSIS — I10 Essential (primary) hypertension: Secondary | ICD-10-CM | POA: Diagnosis not present

## 2017-11-16 DIAGNOSIS — F331 Major depressive disorder, recurrent, moderate: Secondary | ICD-10-CM | POA: Diagnosis not present

## 2017-11-16 DIAGNOSIS — F419 Anxiety disorder, unspecified: Secondary | ICD-10-CM | POA: Diagnosis not present

## 2017-11-21 DIAGNOSIS — F331 Major depressive disorder, recurrent, moderate: Secondary | ICD-10-CM | POA: Diagnosis not present

## 2017-11-21 DIAGNOSIS — F419 Anxiety disorder, unspecified: Secondary | ICD-10-CM | POA: Diagnosis not present

## 2017-12-05 DIAGNOSIS — I10 Essential (primary) hypertension: Secondary | ICD-10-CM | POA: Diagnosis not present

## 2017-12-05 DIAGNOSIS — E785 Hyperlipidemia, unspecified: Secondary | ICD-10-CM | POA: Diagnosis not present

## 2017-12-05 DIAGNOSIS — E568 Deficiency of other vitamins: Secondary | ICD-10-CM | POA: Diagnosis not present

## 2017-12-05 DIAGNOSIS — M6281 Muscle weakness (generalized): Secondary | ICD-10-CM | POA: Diagnosis not present

## 2017-12-06 DIAGNOSIS — G4700 Insomnia, unspecified: Secondary | ICD-10-CM | POA: Diagnosis not present

## 2017-12-06 DIAGNOSIS — F331 Major depressive disorder, recurrent, moderate: Secondary | ICD-10-CM | POA: Diagnosis not present

## 2017-12-07 DIAGNOSIS — F331 Major depressive disorder, recurrent, moderate: Secondary | ICD-10-CM | POA: Diagnosis not present

## 2017-12-07 DIAGNOSIS — F419 Anxiety disorder, unspecified: Secondary | ICD-10-CM | POA: Diagnosis not present

## 2017-12-14 DIAGNOSIS — F419 Anxiety disorder, unspecified: Secondary | ICD-10-CM | POA: Diagnosis not present

## 2017-12-14 DIAGNOSIS — G629 Polyneuropathy, unspecified: Secondary | ICD-10-CM | POA: Diagnosis not present

## 2017-12-14 DIAGNOSIS — F331 Major depressive disorder, recurrent, moderate: Secondary | ICD-10-CM | POA: Diagnosis not present

## 2017-12-14 DIAGNOSIS — M6281 Muscle weakness (generalized): Secondary | ICD-10-CM | POA: Diagnosis not present

## 2017-12-14 DIAGNOSIS — G3281 Cerebellar ataxia in diseases classified elsewhere: Secondary | ICD-10-CM | POA: Diagnosis not present

## 2017-12-14 DIAGNOSIS — G894 Chronic pain syndrome: Secondary | ICD-10-CM | POA: Diagnosis not present

## 2017-12-21 DIAGNOSIS — F419 Anxiety disorder, unspecified: Secondary | ICD-10-CM | POA: Diagnosis not present

## 2017-12-21 DIAGNOSIS — F331 Major depressive disorder, recurrent, moderate: Secondary | ICD-10-CM | POA: Diagnosis not present

## 2017-12-28 DIAGNOSIS — F419 Anxiety disorder, unspecified: Secondary | ICD-10-CM | POA: Diagnosis not present

## 2017-12-28 DIAGNOSIS — F331 Major depressive disorder, recurrent, moderate: Secondary | ICD-10-CM | POA: Diagnosis not present

## 2018-01-03 DIAGNOSIS — G4700 Insomnia, unspecified: Secondary | ICD-10-CM | POA: Diagnosis not present

## 2018-01-03 DIAGNOSIS — F331 Major depressive disorder, recurrent, moderate: Secondary | ICD-10-CM | POA: Diagnosis not present

## 2018-01-11 DIAGNOSIS — F419 Anxiety disorder, unspecified: Secondary | ICD-10-CM | POA: Diagnosis not present

## 2018-01-11 DIAGNOSIS — F331 Major depressive disorder, recurrent, moderate: Secondary | ICD-10-CM | POA: Diagnosis not present

## 2018-01-16 DIAGNOSIS — D649 Anemia, unspecified: Secondary | ICD-10-CM | POA: Diagnosis not present

## 2018-01-16 DIAGNOSIS — I10 Essential (primary) hypertension: Secondary | ICD-10-CM | POA: Diagnosis not present

## 2018-01-16 DIAGNOSIS — E785 Hyperlipidemia, unspecified: Secondary | ICD-10-CM | POA: Diagnosis not present

## 2018-01-16 DIAGNOSIS — C649 Malignant neoplasm of unspecified kidney, except renal pelvis: Secondary | ICD-10-CM | POA: Diagnosis not present

## 2018-01-18 DIAGNOSIS — F419 Anxiety disorder, unspecified: Secondary | ICD-10-CM | POA: Diagnosis not present

## 2018-01-18 DIAGNOSIS — F331 Major depressive disorder, recurrent, moderate: Secondary | ICD-10-CM | POA: Diagnosis not present

## 2018-01-23 DIAGNOSIS — Z85528 Personal history of other malignant neoplasm of kidney: Secondary | ICD-10-CM | POA: Diagnosis not present

## 2018-01-25 DIAGNOSIS — F331 Major depressive disorder, recurrent, moderate: Secondary | ICD-10-CM | POA: Diagnosis not present

## 2018-01-25 DIAGNOSIS — F419 Anxiety disorder, unspecified: Secondary | ICD-10-CM | POA: Diagnosis not present

## 2018-01-31 ENCOUNTER — Ambulatory Visit: Payer: Medicare Other | Admitting: Adult Health

## 2018-01-31 DIAGNOSIS — G4700 Insomnia, unspecified: Secondary | ICD-10-CM | POA: Diagnosis not present

## 2018-01-31 DIAGNOSIS — R0902 Hypoxemia: Secondary | ICD-10-CM | POA: Diagnosis not present

## 2018-01-31 DIAGNOSIS — M6281 Muscle weakness (generalized): Secondary | ICD-10-CM | POA: Diagnosis not present

## 2018-01-31 DIAGNOSIS — R42 Dizziness and giddiness: Secondary | ICD-10-CM | POA: Diagnosis not present

## 2018-01-31 DIAGNOSIS — R0600 Dyspnea, unspecified: Secondary | ICD-10-CM | POA: Diagnosis not present

## 2018-01-31 DIAGNOSIS — F331 Major depressive disorder, recurrent, moderate: Secondary | ICD-10-CM | POA: Diagnosis not present

## 2018-01-31 DIAGNOSIS — R509 Fever, unspecified: Secondary | ICD-10-CM | POA: Diagnosis not present

## 2018-02-01 ENCOUNTER — Telehealth: Payer: Self-pay | Admitting: *Deleted

## 2018-02-01 DIAGNOSIS — D649 Anemia, unspecified: Secondary | ICD-10-CM | POA: Diagnosis not present

## 2018-02-01 DIAGNOSIS — F419 Anxiety disorder, unspecified: Secondary | ICD-10-CM | POA: Diagnosis not present

## 2018-02-01 DIAGNOSIS — G3281 Cerebellar ataxia in diseases classified elsewhere: Secondary | ICD-10-CM | POA: Diagnosis not present

## 2018-02-01 DIAGNOSIS — F331 Major depressive disorder, recurrent, moderate: Secondary | ICD-10-CM | POA: Diagnosis not present

## 2018-02-01 DIAGNOSIS — M6281 Muscle weakness (generalized): Secondary | ICD-10-CM | POA: Diagnosis not present

## 2018-02-01 NOTE — Telephone Encounter (Signed)
Wendy Erickson with [facility] (where pt resides is requesting an asap appt for pt relating to her SCA).  She could not give me any other information other then was request from nursing supervisor.  I asked for note to be faxed over.  MM/NP out of office will forward to Dr. Jannifer Franklin when receive notes.  )pt is WC bound (has had some problems with dysphagia).

## 2018-02-01 NOTE — Telephone Encounter (Signed)
I made appt 02-06-18 at 1100. (pending at this time) w/ MM/NP. LMVM for [facility] to return call.

## 2018-02-02 ENCOUNTER — Telehealth: Payer: Self-pay | Admitting: Neurology

## 2018-02-02 DIAGNOSIS — R0902 Hypoxemia: Secondary | ICD-10-CM | POA: Diagnosis not present

## 2018-02-02 DIAGNOSIS — G3281 Cerebellar ataxia in diseases classified elsewhere: Secondary | ICD-10-CM | POA: Diagnosis not present

## 2018-02-02 DIAGNOSIS — M6281 Muscle weakness (generalized): Secondary | ICD-10-CM | POA: Diagnosis not present

## 2018-02-02 DIAGNOSIS — R1310 Dysphagia, unspecified: Secondary | ICD-10-CM | POA: Diagnosis not present

## 2018-02-02 NOTE — Telephone Encounter (Addendum)
I called LMVM for Wendy Erickson at [facility] [phone] to return call.  Did not receive note relating to pt.  Left my # and fax # re: to appt for pt.

## 2018-02-02 NOTE — Telephone Encounter (Signed)
I got a call from Dr. Kathryne Sharper concerning this patient, her telephone number is [phone].  The patient apparently has had a 3-day history of decreased functional level, weakness, at least one low-grade fever documented, she has been hypoxic.  Tegretol level was 6.0.  They are concerned about a neurologic issue.  They have checked blood work and a chest x-ray, no significant abnormalities were seen.  She has had problems with recurring urinary tract infections, it is not clear that they have checked a recent urinalysis.  I will try to get the patient worked in the next week if possible.

## 2018-02-03 DIAGNOSIS — N390 Urinary tract infection, site not specified: Secondary | ICD-10-CM | POA: Diagnosis not present

## 2018-02-03 DIAGNOSIS — R319 Hematuria, unspecified: Secondary | ICD-10-CM | POA: Diagnosis not present

## 2018-02-03 DIAGNOSIS — Z79899 Other long term (current) drug therapy: Secondary | ICD-10-CM | POA: Diagnosis not present

## 2018-02-05 DIAGNOSIS — D649 Anemia, unspecified: Secondary | ICD-10-CM | POA: Diagnosis not present

## 2018-02-05 DIAGNOSIS — Z79899 Other long term (current) drug therapy: Secondary | ICD-10-CM | POA: Diagnosis not present

## 2018-02-05 DIAGNOSIS — G3281 Cerebellar ataxia in diseases classified elsewhere: Secondary | ICD-10-CM | POA: Diagnosis not present

## 2018-02-05 DIAGNOSIS — N390 Urinary tract infection, site not specified: Secondary | ICD-10-CM | POA: Diagnosis not present

## 2018-02-05 NOTE — Telephone Encounter (Signed)
I called and LMVM for transportation to call me back relating to appt made 02-06-18 at 1100 with MM/NP.

## 2018-02-05 NOTE — Telephone Encounter (Signed)
No appt available this week as of right now. Will continue to watch for any cancellations

## 2018-02-06 ENCOUNTER — Ambulatory Visit (INDEPENDENT_AMBULATORY_CARE_PROVIDER_SITE_OTHER): Payer: Medicare Other | Admitting: Adult Health

## 2018-02-06 ENCOUNTER — Encounter: Payer: Self-pay | Admitting: Adult Health

## 2018-02-06 VITALS — BP 187/78 | HR 54 | Resp 20

## 2018-02-06 DIAGNOSIS — N390 Urinary tract infection, site not specified: Secondary | ICD-10-CM

## 2018-02-06 DIAGNOSIS — G500 Trigeminal neuralgia: Secondary | ICD-10-CM

## 2018-02-06 DIAGNOSIS — G118 Other hereditary ataxias: Secondary | ICD-10-CM | POA: Diagnosis not present

## 2018-02-06 NOTE — Patient Instructions (Signed)
Your Plan:  Continue gabapentin and Carbamazepine Weakness most likely due to UTI Discuss Swallow EVAL with PCP If your symptoms worsen or you develop new symptoms please let us know.   Thank you for coming to see [place] at [facility]. I hope we have been able to provide you high quality care today.  You may receive a patient satisfaction survey over the next few weeks. We would appreciate your feedback and comments so that we may continue to improve ourselves and the health of our patients.

## 2018-02-06 NOTE — Telephone Encounter (Signed)
LMVM for [facility] receptionist to return call to confirm appt for pt today at 1100.

## 2018-02-06 NOTE — Progress Notes (Signed)
I have read the note, and I agree with the clinical assessment and plan.  Charles K Willis   

## 2018-02-06 NOTE — Progress Notes (Signed)
PATIENT: Wendy Erickson DOB: 19-Sep-1940  REASON FOR VISIT: follow up HISTORY FROM: patient  HISTORY OF PRESENT ILLNESS: Today 02/06/18 Wendy Erickson is a 77 year old female with a history of spinaocerebellar ataxia type III.  She returns today for follow-up.  [facility] called asking for a sooner appointment due to weakness and a decline in her functional status.  The patient states that her weakness has gotten better.  Reviewing her notes from the facility it appears that she is being treated for a urinary tract infection.  She states that her weakness is slowly improving.  She does note that she has trouble swallowing solids.  This is been ongoing for several months.  She reports that she was referred to speech therapy for swallowing but she was unable to pay the co-pay.  She continues on carbamazepine.  Denies any issues with trigeminal neuralgia.  She remains on gabapentin for neuropathy and reports that it has been beneficial.  She returns today for evaluation.  HISTORY Wendy Erickson is a 77 year old left-handed white female with a history of spinocerebellar ataxia type III. The patient has severe ataxia of all 4 extremities, she has nystagmus and double vision. The patient does report some problems with swallowing liquids, this is an occasional problem and is not that frequent. Her main issue is that she has problems feeding herself, she does better with finger food. The patient has a history of trigeminal neuralgia, she is on carbamazepine for this with only an occasional twinge of pain. She has a peripheral neuropathy with bilateral foot drops, she has numbness in the feet and she is on gabapentin for the discomfort. She is not ambulatory, she will stand on occasion with physical therapy. She returns to the office today for an evaluation.   REVIEW OF SYSTEMS: Out of a complete 14 system review of symptoms, the patient complains only of the following symptoms, and all other reviewed systems are  negative.  See HPI  ALLERGIES: Allergies  Allergen Reactions  . Pineapple     HOME MEDICATIONS: Outpatient Medications Prior to Visit  Medication Sig Dispense Refill  . acetaminophen (TYLENOL) 325 MG tablet Take 650 mg by mouth every 4 (four) hours as needed for moderate pain or fever (max 10 in 24 hours).    Marland Kitchen amLODipine (NORVASC) 2.5 MG tablet Take 2.5 mg by mouth daily.    Marland Kitchen aspirin EC 81 MG tablet Take 81 mg by mouth daily.    Marland Kitchen atorvastatin (LIPITOR) 40 MG tablet Take 40 mg by mouth at bedtime.     . Calcium Carbonate-Vitamin D (CALCARB 600/D) 600-400 MG-UNIT per tablet Take 1 tablet by mouth every morning.     . carbamazepine (TEGRETOL) 200 MG tablet Take 1 tablet (200 mg total) by mouth 2 (two) times daily. 60 tablet 5  . chlorhexidine (PERIDEX) 0.12 % solution Use as directed 15 mLs in the mouth or throat 2 (two) times daily. Swish and spit    . cholecalciferol (VITAMIN D) 1000 UNITS tablet Take 2,000 Units by mouth every morning.    Marland Kitchen FLUoxetine (PROZAC) 20 MG capsule Take 20 mg by mouth every morning.     . gabapentin (NEURONTIN) 300 MG capsule Take 300 mg by mouth 2 (two) times daily.     Marland Kitchen HYDROcodone-acetaminophen (NORCO/VICODIN) 5-325 MG tablet Take 1-2 tablets by mouth every 6 (six) hours as needed for moderate pain. 30 tablet 0  . metoprolol tartrate (LOPRESSOR) 25 MG tablet Take 0.5 tablets (12.5 mg total) by mouth 2 (  two) times daily.    . polycarbophil (FIBERCON) 625 MG tablet Take 625 mg by mouth 2 (two) times daily.     . polyethylene glycol (MIRALAX) packet Take 17 g by mouth daily as needed for moderate constipation. 10 each 0  . senna (SENOKOT) 8.6 MG tablet Take 1 tablet by mouth 2 (two) times daily.      No facility-administered medications prior to visit.     PAST MEDICAL HISTORY: Past Medical History:  Diagnosis Date  . Abnormality of gait 12/05/2014  . Arthritis    "knees" (05/14/2014)  . 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 Chronic kidney  disease    left renal neoplasm  . Depression   . Dysphagia, pharyngoesophageal phase 12/05/2014  . Foot fracture, right   . Gait disorder   . Heart murmur   . High cholesterol   . History of gout   . Hypertension   . 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 Osteopenia   . Peripheral neuropathy   . SCA-3 (spinocerebellar ataxia type 3) ([place]) 05/13/2013  . Shingles    Right V1 distribution  . Trigeminal neuralgia of right side of face 06/09/2015   V1 distribution    PAST SURGICAL HISTORY: Past Surgical History:  Procedure Laterality Date  . BACK SURGERY    . CATARACT EXTRACTION W/ INTRAOCULAR LENS IMPLANT Left   . DILATION AND CURETTAGE OF UTERUS    . KNEE ARTHROSCOPY Bilateral   . LAPAROSCOPIC NEPHRECTOMY  10/06/2011   Procedure: LAPAROSCOPIC NEPHRECTOMY;  Surgeon: Dutch Gray, MD;  Location: [facility];  Service: Urology;  Laterality: Left;  LEFT LAPAROSCOPIC RADICAL NEPHRECTOMY   . LUMBAR DISC SURGERY     "herniated disc"  . TONSILLECTOMY    . VAGINAL HYSTERECTOMY      FAMILY HISTORY: Family History  Problem Relation Age of Onset  . Ataxia Father   . Dementia Mother     SOCIAL HISTORY: Social History   Socioeconomic History  . Marital status: Single    Spouse name: Not on file  . Number of children: 1  . Years of education: college 2  . Highest education level: Not on file  Occupational History  . Occupation: retired    Fish farm manager: RETIRED  Social Needs  . Financial resource strain: Not on file  . Food insecurity:    Worry: Not on file    Inability: Not on file  . Transportation needs:    Medical: Not on file    Non-medical: Not on file  Tobacco Use  . Smoking status: Former Smoker    Packs/day: 1.00    Years: 40.00    Pack years: 40.00    Types: Cigarettes    Last attempt to quit: 08/08/2005    Years since quitting: 12.5  . Smokeless tobacco: Never Used  Substance and Sexual Activity  . Alcohol use: No    Comment: wine on Friday  . Drug use:  No  . Sexual activity: Never  Lifestyle  . Physical activity:    Days per week: Not on file    Minutes per session: Not on file  . Stress: Not on file  Relationships  . Social connections:    Talks on phone: Not on file    Gets together: Not on file    Attends religious service: Not on file    Active member of club or organization: Not on file    Attends meetings of clubs or organizations: Not  on file    Relationship status: Not on file  . Intimate partner violence:    Fear of current or ex partner: Not on file    Emotionally abused: Not on file    Physically abused: Not on file    Forced sexual activity: Not on file  Other Topics Concern  . Not on file  Social History Narrative   Patient is left handed.   Patient does not drink caffeine.   Resides at [facility].      PHYSICAL EXAM  Vitals:   02/06/18 1101  BP: (!) 187/78  Pulse: (!) 54  Resp: 20   There is no height or weight on file to calculate BMI.  Generalized: Well developed, in no acute distress   Neurological examination  Mentation: Alert oriented to time, place, history taking. Follows all commands speech and language fluent Cranial nerve II-XII: Pupils were equal round reactive to light. Extraocular movements were full, visual field were full on confrontational test. Facial sensation and strength were normal. Uvula tongue midline. Head turning and shoulder shrug  were normal and symmetric. Motor: The motor testing reveals 5 over 5 strength of all 4 extremities. Good symmetric motor tone is noted throughout.  Sensory: Sensory testing is intact to soft touch on all 4 extremities. No evidence of extinction is noted.  Coordination: Cerebellar testing reveals ataxia with finger-nose-finger and heel-to-shin bilaterally.  Gait and station: Patient is in a wheelchair   DIAGNOSTIC DATA (LABS, IMAGING, TESTING) - I reviewed patient records, labs, notes, testing and imaging myself where  available.  Lab Results  Component Value Date   WBC 9.2 05/26/2016   HGB 11.8 (L) 05/26/2016   HCT 36.2 05/26/2016   MCV 91.6 05/26/2016   PLT 308 05/26/2016      Component Value Date/Time   NA 139 05/26/2016 0358   NA 147 (H) 12/05/2014 1135   K 4.5 05/26/2016 0358   CL 104 05/26/2016 0358   CO2 27 05/26/2016 0358   GLUCOSE 101 (H) 05/26/2016 0358   BUN 32 (H) 05/26/2016 0358   BUN 24 12/05/2014 1135   CREATININE 0.99 05/26/2016 0358   CALCIUM 9.1 05/26/2016 0358   PROT 6.4 12/05/2014 1135   ALBUMIN 3.9 12/05/2014 1135   AST 15 12/05/2014 1135   ALT 11 12/05/2014 1135   ALKPHOS 71 12/05/2014 1135   BILITOT <0.2 12/05/2014 1135   GFRNONAA 55 (L) 05/26/2016 0358   GFRAA >60 05/26/2016 0358      ASSESSMENT AND PLAN 77 y.o. 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 here with:  1.  Spinocerebellar ataxia type III 2.  Trigeminal neuralgia 3.  Urinary tract infection  The patient's reported symptoms of generalized weakness is most likely due to urinary tract infection.  There was no significant weakness noted on exam today.  I have advised that her that the weakness should continue to get better as her urinary tract infection resolves.  She will continue on carbamazepine for trigeminal neuralgia.  Continue on gabapentin.  I have encouraged the patient to participate in speech therapy for a swallow evaluation.  I have recommended that her primary care provider at  the facility order this for her.  Patient voiced understanding.   She will follow-up in 6 months or sooner if needed.      Ward Givens, MSN, NP-C 02/06/2018, 10:57 AM [facility] [address] [place], [place] [address] [phone]

## 2018-02-06 NOTE — Telephone Encounter (Signed)
Pt here for appt today to see MM,NP at 11am

## 2018-02-14 DIAGNOSIS — I739 Peripheral vascular disease, unspecified: Secondary | ICD-10-CM | POA: Diagnosis not present

## 2018-02-14 DIAGNOSIS — L603 Nail dystrophy: Secondary | ICD-10-CM | POA: Diagnosis not present

## 2018-02-14 DIAGNOSIS — Q845 Enlarged and hypertrophic nails: Secondary | ICD-10-CM | POA: Diagnosis not present

## 2018-02-15 DIAGNOSIS — F331 Major depressive disorder, recurrent, moderate: Secondary | ICD-10-CM | POA: Diagnosis not present

## 2018-02-15 DIAGNOSIS — F419 Anxiety disorder, unspecified: Secondary | ICD-10-CM | POA: Diagnosis not present

## 2018-02-19 DIAGNOSIS — R110 Nausea: Secondary | ICD-10-CM | POA: Diagnosis not present

## 2018-02-19 DIAGNOSIS — M6281 Muscle weakness (generalized): Secondary | ICD-10-CM | POA: Diagnosis not present

## 2018-02-19 DIAGNOSIS — E785 Hyperlipidemia, unspecified: Secondary | ICD-10-CM | POA: Diagnosis not present

## 2018-02-19 DIAGNOSIS — G3281 Cerebellar ataxia in diseases classified elsewhere: Secondary | ICD-10-CM | POA: Diagnosis not present

## 2018-02-19 DIAGNOSIS — D649 Anemia, unspecified: Secondary | ICD-10-CM | POA: Diagnosis not present

## 2018-02-19 DIAGNOSIS — S90221A Contusion of right lesser toe(s) with damage to nail, initial encounter: Secondary | ICD-10-CM | POA: Diagnosis not present

## 2018-02-19 DIAGNOSIS — Z79899 Other long term (current) drug therapy: Secondary | ICD-10-CM | POA: Diagnosis not present

## 2018-02-22 DIAGNOSIS — F419 Anxiety disorder, unspecified: Secondary | ICD-10-CM | POA: Diagnosis not present

## 2018-02-22 DIAGNOSIS — F331 Major depressive disorder, recurrent, moderate: Secondary | ICD-10-CM | POA: Diagnosis not present

## 2018-02-28 DIAGNOSIS — F331 Major depressive disorder, recurrent, moderate: Secondary | ICD-10-CM | POA: Diagnosis not present

## 2018-02-28 DIAGNOSIS — G4700 Insomnia, unspecified: Secondary | ICD-10-CM | POA: Diagnosis not present

## 2018-03-02 ENCOUNTER — Other Ambulatory Visit (HOSPITAL_COMMUNITY): Payer: Self-pay | Admitting: Internal Medicine

## 2018-03-02 DIAGNOSIS — R1310 Dysphagia, unspecified: Secondary | ICD-10-CM

## 2018-03-08 ENCOUNTER — Ambulatory Visit (HOSPITAL_COMMUNITY)
Admission: RE | Admit: 2018-03-08 | Discharge: 2018-03-08 | Disposition: A | Discharge status: Home / Self Care | Payer: Medicare Other | Source: Ambulatory Visit | Attending: Internal Medicine | Admitting: Internal Medicine

## 2018-03-08 DIAGNOSIS — R1312 Dysphagia, oropharyngeal phase: Secondary | ICD-10-CM

## 2018-03-08 DIAGNOSIS — R1310 Dysphagia, unspecified: Secondary | ICD-10-CM | POA: Diagnosis not present

## 2018-03-08 DIAGNOSIS — R1313 Dysphagia, pharyngeal phase: Secondary | ICD-10-CM | POA: Diagnosis not present

## 2018-03-08 NOTE — Therapy (Signed)
Modified Barium Swallow Progress Note  Patient Details  Name: Wendy Erickson MRN: [id number] Date of Birth: 1941-02-14  Today's Date: 03/08/2018  Modified Barium Swallow completed.  Full report located under Chart Review in the Imaging Section.  Brief recommendations include the following:  Clinical Impression Pt presents with functional oral and pharyngeal swallow, with the exception of thin liquids via straw, which were noted to be penetrated during the swallow. Penetrate did not clear spontaneously, but was not aspirated.  No penetration was seen on multiple trials of thin liquid via cup sip, even when challenged with large and consecutive swallows. No post-swallow residue noted. Puree and cracker were tolerated without difficulty, delay, penetration/aspiration, or post-swallow residue.  Pt symptoms raise suspicion for primary esophageal dysphagia. Pt was given written behavioral and dietary strategies for management of esophageal dysmotility, and these were reviewed with pt and her caregiver. If adherence to these strategies is not beneficial to resolve pt issues, consideration of REGULAR barium swallow is recommended, to evaluate esophageal motility.      Swallow Evaluation Recommendations  Recommended Consults: Consider GI evaluation   SLP Diet Recommendations: Regular solids;Dysphagia 3 (Mech soft) solids;Thin liquid   Liquid Administration via: Cup;No straw   Medication Administration: Whole meds with liquid   Supervision: Patient able to self feed   Compensations: Minimize environmental distractions;Slow rate;Small sips/bites;Follow solids with liquid   Postural Changes: Seated upright at 90 degrees   Oral Care Recommendations: Oral care BID  Celia B. Quentin Ore, [facility], [place] Speech Language Pathologist [phone]  Wendy Erickson 03/08/2018,1:53 PM

## 2018-03-15 DIAGNOSIS — F419 Anxiety disorder, unspecified: Secondary | ICD-10-CM | POA: Diagnosis not present

## 2018-03-15 DIAGNOSIS — F331 Major depressive disorder, recurrent, moderate: Secondary | ICD-10-CM | POA: Diagnosis not present

## 2018-03-22 DIAGNOSIS — I10 Essential (primary) hypertension: Secondary | ICD-10-CM | POA: Diagnosis not present

## 2018-03-22 DIAGNOSIS — D649 Anemia, unspecified: Secondary | ICD-10-CM | POA: Diagnosis not present

## 2018-03-22 DIAGNOSIS — K224 Dyskinesia of esophagus: Secondary | ICD-10-CM | POA: Diagnosis not present

## 2018-03-22 DIAGNOSIS — C649 Malignant neoplasm of unspecified kidney, except renal pelvis: Secondary | ICD-10-CM | POA: Diagnosis not present

## 2018-03-28 DIAGNOSIS — F331 Major depressive disorder, recurrent, moderate: Secondary | ICD-10-CM | POA: Diagnosis not present

## 2018-03-28 DIAGNOSIS — G4700 Insomnia, unspecified: Secondary | ICD-10-CM | POA: Diagnosis not present

## 2018-03-29 DIAGNOSIS — F331 Major depressive disorder, recurrent, moderate: Secondary | ICD-10-CM | POA: Diagnosis not present

## 2018-03-29 DIAGNOSIS — F419 Anxiety disorder, unspecified: Secondary | ICD-10-CM | POA: Diagnosis not present

## 2018-04-05 DIAGNOSIS — F419 Anxiety disorder, unspecified: Secondary | ICD-10-CM | POA: Diagnosis not present

## 2018-04-05 DIAGNOSIS — F331 Major depressive disorder, recurrent, moderate: Secondary | ICD-10-CM | POA: Diagnosis not present

## 2018-04-11 DIAGNOSIS — F331 Major depressive disorder, recurrent, moderate: Secondary | ICD-10-CM | POA: Diagnosis not present

## 2018-04-12 DIAGNOSIS — F419 Anxiety disorder, unspecified: Secondary | ICD-10-CM | POA: Diagnosis not present

## 2018-04-12 DIAGNOSIS — F331 Major depressive disorder, recurrent, moderate: Secondary | ICD-10-CM | POA: Diagnosis not present

## 2018-04-16 ENCOUNTER — Ambulatory Visit: Payer: Medicare Other | Admitting: Adult Health

## 2018-04-17 DIAGNOSIS — M6281 Muscle weakness (generalized): Secondary | ICD-10-CM | POA: Diagnosis not present

## 2018-04-17 DIAGNOSIS — C649 Malignant neoplasm of unspecified kidney, except renal pelvis: Secondary | ICD-10-CM | POA: Diagnosis not present

## 2018-04-17 DIAGNOSIS — I10 Essential (primary) hypertension: Secondary | ICD-10-CM | POA: Diagnosis not present

## 2018-04-17 DIAGNOSIS — D649 Anemia, unspecified: Secondary | ICD-10-CM | POA: Diagnosis not present

## 2018-04-18 DIAGNOSIS — Q845 Enlarged and hypertrophic nails: Secondary | ICD-10-CM | POA: Diagnosis not present

## 2018-04-18 DIAGNOSIS — L603 Nail dystrophy: Secondary | ICD-10-CM | POA: Diagnosis not present

## 2018-04-18 DIAGNOSIS — I739 Peripheral vascular disease, unspecified: Secondary | ICD-10-CM | POA: Diagnosis not present

## 2018-04-19 DIAGNOSIS — F331 Major depressive disorder, recurrent, moderate: Secondary | ICD-10-CM | POA: Diagnosis not present

## 2018-04-19 DIAGNOSIS — F419 Anxiety disorder, unspecified: Secondary | ICD-10-CM | POA: Diagnosis not present

## 2018-04-25 DIAGNOSIS — M25512 Pain in left shoulder: Secondary | ICD-10-CM | POA: Diagnosis not present

## 2018-04-25 DIAGNOSIS — R296 Repeated falls: Secondary | ICD-10-CM | POA: Diagnosis not present

## 2018-05-03 DIAGNOSIS — F331 Major depressive disorder, recurrent, moderate: Secondary | ICD-10-CM | POA: Diagnosis not present

## 2018-05-03 DIAGNOSIS — F419 Anxiety disorder, unspecified: Secondary | ICD-10-CM | POA: Diagnosis not present

## 2018-05-09 DIAGNOSIS — F331 Major depressive disorder, recurrent, moderate: Secondary | ICD-10-CM | POA: Diagnosis not present

## 2018-05-09 DIAGNOSIS — G4700 Insomnia, unspecified: Secondary | ICD-10-CM | POA: Diagnosis not present

## 2018-05-10 DIAGNOSIS — F419 Anxiety disorder, unspecified: Secondary | ICD-10-CM | POA: Diagnosis not present

## 2018-05-10 DIAGNOSIS — F331 Major depressive disorder, recurrent, moderate: Secondary | ICD-10-CM | POA: Diagnosis not present

## 2018-05-17 DIAGNOSIS — F331 Major depressive disorder, recurrent, moderate: Secondary | ICD-10-CM | POA: Diagnosis not present

## 2018-05-17 DIAGNOSIS — F419 Anxiety disorder, unspecified: Secondary | ICD-10-CM | POA: Diagnosis not present

## 2018-05-31 DIAGNOSIS — F331 Major depressive disorder, recurrent, moderate: Secondary | ICD-10-CM | POA: Diagnosis not present

## 2018-05-31 DIAGNOSIS — F419 Anxiety disorder, unspecified: Secondary | ICD-10-CM | POA: Diagnosis not present

## 2018-06-07 DIAGNOSIS — F419 Anxiety disorder, unspecified: Secondary | ICD-10-CM | POA: Diagnosis not present

## 2018-06-07 DIAGNOSIS — F331 Major depressive disorder, recurrent, moderate: Secondary | ICD-10-CM | POA: Diagnosis not present

## 2018-06-13 DIAGNOSIS — F331 Major depressive disorder, recurrent, moderate: Secondary | ICD-10-CM | POA: Diagnosis not present

## 2018-06-13 DIAGNOSIS — G4701 Insomnia due to medical condition: Secondary | ICD-10-CM | POA: Diagnosis not present

## 2018-06-14 DIAGNOSIS — R278 Other lack of coordination: Secondary | ICD-10-CM | POA: Diagnosis not present

## 2018-06-14 DIAGNOSIS — I609 Nontraumatic subarachnoid hemorrhage, unspecified: Secondary | ICD-10-CM | POA: Diagnosis not present

## 2018-06-14 DIAGNOSIS — F331 Major depressive disorder, recurrent, moderate: Secondary | ICD-10-CM | POA: Diagnosis not present

## 2018-06-14 DIAGNOSIS — F419 Anxiety disorder, unspecified: Secondary | ICD-10-CM | POA: Diagnosis not present

## 2018-06-14 DIAGNOSIS — Z9181 History of falling: Secondary | ICD-10-CM | POA: Diagnosis not present

## 2018-06-19 DIAGNOSIS — R5381 Other malaise: Secondary | ICD-10-CM | POA: Diagnosis not present

## 2018-06-19 DIAGNOSIS — R10812 Left upper quadrant abdominal tenderness: Secondary | ICD-10-CM | POA: Diagnosis not present

## 2018-06-19 DIAGNOSIS — R109 Unspecified abdominal pain: Secondary | ICD-10-CM | POA: Diagnosis not present

## 2018-06-19 DIAGNOSIS — R630 Anorexia: Secondary | ICD-10-CM | POA: Diagnosis not present

## 2018-06-20 ENCOUNTER — Observation Stay (HOSPITAL_COMMUNITY)
Admission: EM | Admit: 2018-06-20 | Discharge: 2018-06-22 | Disposition: A | Discharge status: Skilled Nursing Facility | Payer: Medicare Other | Attending: Internal Medicine | Admitting: Internal Medicine

## 2018-06-20 ENCOUNTER — Emergency Department (HOSPITAL_COMMUNITY): Payer: Medicare Other

## 2018-06-20 ENCOUNTER — Encounter (HOSPITAL_COMMUNITY): Payer: Self-pay

## 2018-06-20 DIAGNOSIS — G629 Polyneuropathy, unspecified: Secondary | ICD-10-CM | POA: Insufficient documentation

## 2018-06-20 DIAGNOSIS — R5381 Other malaise: Secondary | ICD-10-CM | POA: Diagnosis not present

## 2018-06-20 DIAGNOSIS — D509 Iron deficiency anemia, unspecified: Secondary | ICD-10-CM | POA: Diagnosis not present

## 2018-06-20 DIAGNOSIS — E7800 Pure hypercholesterolemia, unspecified: Secondary | ICD-10-CM | POA: Insufficient documentation

## 2018-06-20 DIAGNOSIS — G119 Hereditary ataxia, unspecified: Secondary | ICD-10-CM | POA: Diagnosis not present

## 2018-06-20 DIAGNOSIS — Z79899 Other long term (current) drug therapy: Secondary | ICD-10-CM | POA: Insufficient documentation

## 2018-06-20 DIAGNOSIS — Z7982 Long term (current) use of aspirin: Secondary | ICD-10-CM | POA: Insufficient documentation

## 2018-06-20 DIAGNOSIS — G111 Early-onset cerebellar ataxia: Secondary | ICD-10-CM | POA: Diagnosis not present

## 2018-06-20 DIAGNOSIS — I129 Hypertensive chronic kidney disease with stage 1 through stage 4 chronic kidney disease, or unspecified chronic kidney disease: Secondary | ICD-10-CM | POA: Diagnosis not present

## 2018-06-20 DIAGNOSIS — E785 Hyperlipidemia, unspecified: Secondary | ICD-10-CM | POA: Diagnosis not present

## 2018-06-20 DIAGNOSIS — Z87891 Personal history of nicotine dependence: Secondary | ICD-10-CM | POA: Diagnosis not present

## 2018-06-20 DIAGNOSIS — R1314 Dysphagia, pharyngoesophageal phase: Secondary | ICD-10-CM | POA: Diagnosis present

## 2018-06-20 DIAGNOSIS — I10 Essential (primary) hypertension: Secondary | ICD-10-CM | POA: Diagnosis not present

## 2018-06-20 DIAGNOSIS — R0602 Shortness of breath: Principal | ICD-10-CM | POA: Insufficient documentation

## 2018-06-20 DIAGNOSIS — Z905 Acquired absence of kidney: Secondary | ICD-10-CM | POA: Diagnosis not present

## 2018-06-20 DIAGNOSIS — D649 Anemia, unspecified: Secondary | ICD-10-CM | POA: Diagnosis not present

## 2018-06-20 DIAGNOSIS — F329 Major depressive disorder, single episode, unspecified: Secondary | ICD-10-CM | POA: Diagnosis not present

## 2018-06-20 DIAGNOSIS — Z85528 Personal history of other malignant neoplasm of kidney: Secondary | ICD-10-CM | POA: Diagnosis not present

## 2018-06-20 DIAGNOSIS — N189 Chronic kidney disease, unspecified: Secondary | ICD-10-CM | POA: Insufficient documentation

## 2018-06-20 DIAGNOSIS — R630 Anorexia: Secondary | ICD-10-CM | POA: Diagnosis not present

## 2018-06-20 DIAGNOSIS — J9811 Atelectasis: Secondary | ICD-10-CM | POA: Diagnosis not present

## 2018-06-20 DIAGNOSIS — G118 Other hereditary ataxias: Secondary | ICD-10-CM | POA: Diagnosis present

## 2018-06-20 DIAGNOSIS — R52 Pain, unspecified: Secondary | ICD-10-CM | POA: Diagnosis not present

## 2018-06-20 DIAGNOSIS — Z66 Do not resuscitate: Secondary | ICD-10-CM | POA: Diagnosis not present

## 2018-06-20 HISTORY — DX: Anemia, unspecified: D64.9

## 2018-06-20 NOTE — ED Provider Notes (Signed)
Medical screening examination/treatment/procedure(s) were conducted as a shared visit with non-physician practitioner(s) and myself.  I personally evaluated the patient during the encounter.  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  Found to have hemoglobin of 3.5 at her nursing facility.  On examination, patient is hemodynamically stable without hemorrhage.  She has conjunctival pallor and appears pale on examination.  Hemoglobin is 3.6 here.  I feel that this is likely real.  Will transfuse 5 units of packed red blood cells in the ED and admit.   Aws Shere, Delice Bison, DO 06/21/18 0030

## 2018-06-20 NOTE — ED Triage Notes (Signed)
Pt comes via [facility] from [facility], has had increased lethargy over the past few days, had lab drawn and HBG was 3.5, some SOB, wears 2L all the time

## 2018-06-20 NOTE — ED Provider Notes (Signed)
[facility] EMERGENCY DEPARTMENT Provider Note   CSN: [id number] Arrival date & time: 06/20/18  2303     History   Chief Complaint Chief Complaint  Patient presents with  . Abnormal Lab    HPI Lourene Hoston is a 77 y.o. female.  The history is provided by medical records and the patient. No language interpreter was used.   Zenora Karpel is a 77 y.o. female  with a PMH as listed below including, but not limited to HTN, HLD who presents to the Emergency Department facility after lab work revealed hemoglobin of 3.7.  Patient states that she had been feeling tired over the last 2 weeks.  It has become progressively worse.  She started becoming a little short of breath last week and this has been progressively worsening as well.  She denies any fever, cough or congestion.  She reports a near syncopal episode while she was sitting down a day or 2 ago.  She never had a head injury or full syncopal episode.  She is on a baby aspirin, but no other anticoagulation.  She denies any history of anemia or needing a blood transfusion.  She denies any blood in the stool.     Past Medical History:  Diagnosis Date  . Abnormality of gait 12/05/2014  . Arthritis    "knees" (05/14/2014)  . 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 Chronic kidney disease    left renal neoplasm  . Depression   . Dysphagia, pharyngoesophageal phase 12/05/2014  . Foot fracture, right   . Gait disorder   . Heart murmur   . High cholesterol   . History of gout   . Hypertension   . Kidney malignancy ([place])    left renal neoplasm  . Osteopenia   . Peripheral neuropathy   . 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 Shingles    Right V1 distribution  . Trigeminal neuralgia of right side of face 06/09/2015   V1 distribution    Patient Active Problem List   Diagnosis Date Noted  . Fall   . Subarachnoid hemorrhage ([place]) 05/25/2016  . Subdural hematoma ([place]) 05/25/2016  . SAH  (subarachnoid hemorrhage) ([place]) 05/25/2016  . Laceration of face-right supraorbital 05/25/2016  . Trigeminal neuralgia of right side of face 06/09/2015  . Abnormality of gait 12/05/2014  . Dysphagia, pharyngoesophageal phase 12/05/2014  . Cholelithiasis 05/14/2014  . Abdominal pain, epigastric 05/14/2014  . Systolic murmur 62/37/6283  . Chest pain 05/11/2014  . Atypical chest pain 05/11/2014  . 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 Hydronephrosis of right kidney 04/13/2013  . Absent kidney, acquired 04/13/2013  . Pyelonephritis 04/11/2013  . AKI (acute kidney injury) ([place]) 04/11/2013  . HTN (hypertension) 04/11/2013    Past Surgical History:  Procedure Laterality Date  . BACK SURGERY    . CATARACT EXTRACTION W/ INTRAOCULAR LENS IMPLANT Left   . DILATION AND CURETTAGE OF UTERUS    . KNEE ARTHROSCOPY Bilateral   . LAPAROSCOPIC NEPHRECTOMY  10/06/2011   Procedure: LAPAROSCOPIC NEPHRECTOMY;  Surgeon: Dutch Gray, MD;  Location: [facility];  Service: Urology;  Laterality: Left;  LEFT LAPAROSCOPIC RADICAL NEPHRECTOMY   . LUMBAR DISC SURGERY     "herniated disc"  . TONSILLECTOMY    . VAGINAL HYSTERECTOMY       OB History   None      Home Medications    Prior to Admission medications   Medication Sig Start Date End  Date Taking? Authorizing Provider  acetaminophen (TYLENOL) 325 MG tablet Take 650 mg by mouth every 4 (four) hours as needed for moderate pain or fever (max 10 in 24 hours).   Yes [provider]  aspirin EC 81 MG tablet Take 81 mg by mouth daily.   Yes   atorvastatin (LIPITOR) 40 MG tablet Take 40 mg by mouth at bedtime.    Yes [provider]  Calcium Carbonate-Vitamin D (CALCARB 600/D) 600-400 MG-UNIT per tablet Take 1 tablet by mouth daily.    Yes [provider]  carbamazepine (TEGRETOL) 200 MG tablet Take 1 tablet (200 mg total) by mouth 2 (two) times daily. 06/05/14  Yes Kathrynn Ducking, MD  cetirizine (ZYRTEC) 10 MG  tablet Take 10 mg by mouth at bedtime.    Yes [provider]  ENSURE (ENSURE) Take 237 mLs by mouth 3 (three) times daily between meals.   Yes [provider]  gabapentin (NEURONTIN) 300 MG capsule Take 300 mg by mouth 2 (two) times daily.    Yes [provider]  hypromellose (GENTEAL SEVERE) 0.3 % GEL ophthalmic ointment Place 1 application into both eyes at bedtime.   Yes [provider]  magnesium hydroxide (MILK OF MAGNESIA) 400 MG/5ML suspension Take 30 mLs by mouth daily as needed for mild constipation.   Yes [provider]  Melatonin 1 MG TABS Take 1 mg by mouth at bedtime.    Yes [provider]  metoprolol tartrate (LOPRESSOR) 25 MG tablet Take 0.5 tablets (12.5 mg total) by mouth 2 (two) times daily. 05/27/16  Yes Bonnielee Haff, MD  ondansetron (ZOFRAN) 4 MG tablet Take 4 mg by mouth 4 (four) times daily as needed for nausea or vomiting.   Yes [provider]  pantoprazole (PROTONIX) 40 MG tablet Take 40 mg by mouth daily.   Yes [provider]  polycarbophil (FIBERCON) 625 MG tablet Take 625 mg by mouth 2 (two) times daily.    Yes [provider]  polyethylene glycol (MIRALAX) packet Take 17 g by mouth daily as needed for moderate constipation. 05/14/14  Yes Domenic Polite, MD  senna (SENOKOT) 8.6 MG tablet Take 1 tablet by mouth 2 (two) times daily.    Yes [provider]  sertraline (ZOLOFT) 100 MG tablet Take 100 mg by mouth daily. Take with 25 mg to equal 125 mg   Yes [provider]  sertraline (ZOLOFT) 25 MG tablet Take 25 mg by mouth daily. Take with 100 mg to equal 125 mg   Yes [provider]  HYDROcodone-acetaminophen (NORCO/VICODIN) 5-325 MG tablet Take 1-2 tablets by mouth every 6 (six) hours as needed for moderate pain. Patient not taking: Reported on 02/06/2018 05/27/16   Bonnielee Haff, MD    Family History Family History  Problem Relation Age of Onset  .  Ataxia Father   . Dementia Mother     Social History Social History   Tobacco Use  . Smoking status: Former Smoker    Packs/day: 1.00    Years: 40.00    Pack years: 40.00    Types: Cigarettes    Last attempt to quit: 08/08/2005    Years since quitting: 12.8  . Smokeless tobacco: Never Used  Substance Use Topics  . Alcohol use: No    Comment: wine on Friday  . Drug use: No     Allergies   Pineapple   Review of Systems Review of Systems  Constitutional: Positive for fatigue. Negative for chills  and fever.  Respiratory: Positive for shortness of breath. Negative for cough and wheezing.   Cardiovascular: Negative for chest pain.  Gastrointestinal: Negative for abdominal pain.  Neurological: Positive for weakness.  All other systems reviewed and are negative.    Physical Exam Updated Vital Signs BP (!) 182/65   Pulse 66   Temp 98.5 F (36.9 C) (Oral)   Resp 13   SpO2 99%   Physical Exam  Constitutional: She is oriented to person, place, and time. She appears well-developed and well-nourished. No distress.  HENT:  Head: Normocephalic and atraumatic.  Cardiovascular: Normal rate, regular rhythm and normal heart sounds.  No murmur heard. Pulmonary/Chest: Effort normal. No respiratory distress.  Abdominal: Soft. She exhibits no distension. There is no tenderness.  Genitourinary:  Genitourinary Comments: Light brown stool on rectal exam.  Neurological: She is alert and oriented to person, place, and time.  Skin: Skin is warm and dry. There is pallor.  Nursing note and vitals reviewed.    ED Treatments / Results  Labs (all labs ordered are listed, but only abnormal results are displayed) Labs Reviewed  COMPREHENSIVE METABOLIC PANEL - Abnormal; Notable for the following components:      Result Value   Glucose, Bld 112 (*)    BUN 29 (*)    Creatinine, Ser 1.14 (*)    Calcium 8.7 (*)    Total Protein 5.9 (*)    Albumin 3.0 (*)    GFR calc non Af Amer 46 (*)     GFR calc Af Amer 53 (*)    All other components within normal limits  CBC WITH DIFFERENTIAL/PLATELET - Abnormal; Notable for the following components:   WBC 13.1 (*)    RBC 2.27 (*)    Hemoglobin 3.6 (*)    HCT 15.1 (*)    MCV 66.5 (*)    MCH 15.9 (*)    MCHC 23.8 (*)    RDW 21.0 (*)    Platelets 405 (*)    nRBC 0.9 (*)    Neutro Abs 11.4 (*)    nRBC 4 (*)    All other components within normal limits  POC OCCULT BLOOD, ED  TYPE AND SCREEN  PREPARE RBC (CROSSMATCH)    EKG EKG Interpretation  Date/Time:  Wednesday June 20 2018 23:10:32 EST Ventricular Rate:  71 PR Interval:    QRS Duration: 97 QT Interval:  393 QTC Calculation: 428 R Axis:   6 Text Interpretation:  Sinus or ectopic atrial rhythm Inferior infarct, old Probable anterolateral infarct, old Baseline wander in lead(s) V5 No significant change since last tracing Confirmed by Pryor Curia [phone]) on 06/20/2018 11:29:18 PM   Radiology Dg Chest 2 View  Result Date: 06/20/2018 CLINICAL DATA:  Increased lethargy over the past few days. Low hemoglobin. EXAM: CHEST - 2 VIEW COMPARISON:  01/20/2017 FINDINGS: Stable heart size. Aortic atherosclerosis at the arch without aneurysm. Mild prominence of the interstitium, chronic in appearance. Linear platelike atelectasis or scarring is noted in the right middle lobe. Trace posterior pleural effusions or pleural thickening is noted posteriorly. IMPRESSION: Aortic atherosclerosis. Platelike atelectasis and/or scarring in the right middle lobe. Chronic mild interstitial prominence. Electronically Signed   By: Ashley Royalty M.D.   On: 06/20/2018 23:47    Procedures Procedures (including critical care time)  CRITICAL CARE Performed by: Ozella Almond    Total critical care time: 35 minutes  Critical care time was exclusive of separately billable procedures and treating other patients.  Critical care was  necessary to treat or prevent imminent or life-threatening  deterioration.  Critical care was time spent personally by me on the following activities: development of treatment plan with patient and/or surrogate as well as nursing, discussions with consultants, evaluation of patient's response to treatment, examination of patient, obtaining history from patient or surrogate, ordering and performing treatments and interventions, ordering and review of laboratory studies, ordering and review of radiographic studies, pulse oximetry and re-evaluation of patient's condition.   Medications Ordered in ED Medications  0.9 %  sodium chloride infusion (10 mL/hr Intravenous New Bag/Given 06/21/18 0051)     Initial Impression / Assessment and Plan / ED Course  I have reviewed the triage vital signs and the nursing notes.  Pertinent labs & imaging results that were available during my care of the patient were reviewed by me and considered in my medical decision making (see chart for details).    Jalyssa Fleisher is a 77 y.o. female who presents to ED from facility for worsening fatigue and shortness of breath over the last few weeks. Blood work at facility noted hgb of 3.7, therefore she was sent to ER tonight. On exam, patient is hemodynamically stable. Hemoccult negative. Hemoglobin here of 3.6. Blood transfusion ordered. Hospitalist consulted who will admit.   Patient seen by and discussed with Dr. Leonides Schanz who agrees with treatment plan.   Final Clinical Impressions(s) / ED Diagnoses   Final diagnoses:  Shortness of breath  Symptomatic anemia    ED Discharge Orders    None       , Ozella Almond, PA-C 06/21/18 0330

## 2018-06-21 ENCOUNTER — Encounter (HOSPITAL_COMMUNITY): Payer: Self-pay | Admitting: General Practice

## 2018-06-21 ENCOUNTER — Observation Stay (HOSPITAL_COMMUNITY): Payer: Medicare Other

## 2018-06-21 ENCOUNTER — Other Ambulatory Visit: Payer: Self-pay

## 2018-06-21 DIAGNOSIS — R1314 Dysphagia, pharyngoesophageal phase: Secondary | ICD-10-CM

## 2018-06-21 DIAGNOSIS — R0602 Shortness of breath: Secondary | ICD-10-CM | POA: Diagnosis not present

## 2018-06-21 DIAGNOSIS — D509 Iron deficiency anemia, unspecified: Secondary | ICD-10-CM | POA: Diagnosis not present

## 2018-06-21 DIAGNOSIS — D649 Anemia, unspecified: Secondary | ICD-10-CM | POA: Diagnosis present

## 2018-06-21 DIAGNOSIS — I10 Essential (primary) hypertension: Secondary | ICD-10-CM

## 2018-06-21 DIAGNOSIS — G118 Other hereditary ataxias: Secondary | ICD-10-CM

## 2018-06-21 HISTORY — DX: Anemia, unspecified: D64.9

## 2018-06-21 LAB — COMPREHENSIVE METABOLIC PANEL
ALK PHOS: 48 U/L (ref 38–126)
ALT: 16 U/L (ref 0–44)
AST: 19 U/L (ref 15–41)
Albumin: 3 g/dL — ABNORMAL LOW (ref 3.5–5.0)
Anion gap: 7 (ref 5–15)
BILIRUBIN TOTAL: 0.4 mg/dL (ref 0.3–1.2)
BUN: 29 mg/dL — AB (ref 8–23)
CALCIUM: 8.7 mg/dL — AB (ref 8.9–10.3)
CO2: 25 mmol/L (ref 22–32)
CREATININE: 1.14 mg/dL — AB (ref 0.44–1.00)
Chloride: 107 mmol/L (ref 98–111)
GFR, EST AFRICAN AMERICAN: 53 mL/min — AB (ref >60)
GFR, EST NON AFRICAN AMERICAN: 46 mL/min — AB (ref >60)
Glucose, Bld: 112 mg/dL — ABNORMAL HIGH (ref 70–99)
Potassium: 4.2 mmol/L (ref 3.5–5.1)
Sodium: 139 mmol/L (ref 135–145)
TOTAL PROTEIN: 5.9 g/dL — AB (ref 6.5–8.1)

## 2018-06-21 LAB — CBC WITH DIFFERENTIAL/PLATELET
BASOS PCT: 0 %
Basophils Absolute: 0 10*3/uL (ref 0.0–0.1)
Eosinophils Absolute: 0 10*3/uL (ref 0.0–0.5)
Eosinophils Relative: 0 %
HEMATOCRIT: 15.1 % — AB (ref 36.0–46.0)
HEMOGLOBIN: 3.6 g/dL — AB (ref 12.0–15.0)
LYMPHS PCT: 11 %
Lymphs Abs: 1.4 10*3/uL (ref 0.7–4.0)
MCH: 15.9 pg — ABNORMAL LOW (ref 26.0–34.0)
MCHC: 23.8 g/dL — ABNORMAL LOW (ref 30.0–36.0)
MCV: 66.5 fL — ABNORMAL LOW (ref 80.0–100.0)
MONO ABS: 0.3 10*3/uL (ref 0.1–1.0)
MONOS PCT: 2 %
NEUTROS ABS: 11.4 10*3/uL — AB (ref 1.7–7.7)
Neutrophils Relative %: 87 %
Platelets: 405 10*3/uL — ABNORMAL HIGH (ref 150–400)
RBC: 2.27 MIL/uL — ABNORMAL LOW (ref 3.87–5.11)
RDW: 21 % — ABNORMAL HIGH (ref 11.5–15.5)
WBC: 13.1 10*3/uL — ABNORMAL HIGH (ref 4.0–10.5)
nRBC: 0.9 % — ABNORMAL HIGH (ref 0.0–0.2)
nRBC: 4 /100 WBC — ABNORMAL HIGH

## 2018-06-21 LAB — MRSA PCR SCREENING: MRSA by PCR: NEGATIVE

## 2018-06-21 LAB — HEMOGLOBIN AND HEMATOCRIT, BLOOD
HEMATOCRIT: 26.7 % — AB (ref 36.0–46.0)
Hemoglobin: 7.5 g/dL — ABNORMAL LOW (ref 12.0–15.0)

## 2018-06-21 LAB — CBC
HCT: 29.4 % — ABNORMAL LOW (ref 36.0–46.0)
HEMOGLOBIN: 8.7 g/dL — AB (ref 12.0–15.0)
MCH: 22.5 pg — AB (ref 26.0–34.0)
MCHC: 29.6 g/dL — ABNORMAL LOW (ref 30.0–36.0)
MCV: 76 fL — AB (ref 80.0–100.0)
NRBC: 1 % — AB (ref 0.0–0.2)
PLATELETS: 334 10*3/uL (ref 150–400)
RBC: 3.87 MIL/uL (ref 3.87–5.11)
RDW: 25.8 % — ABNORMAL HIGH (ref 11.5–15.5)
WBC: 12.6 10*3/uL — ABNORMAL HIGH (ref 4.0–10.5)

## 2018-06-21 LAB — OCCULT BLOOD X 1 CARD TO LAB, STOOL: Fecal Occult Bld: POSITIVE — AB

## 2018-06-21 LAB — PREPARE RBC (CROSSMATCH)

## 2018-06-21 LAB — OCCULT BLOOD, POC DEVICE: Fecal Occult Bld: NEGATIVE

## 2018-06-21 MED ORDER — ARTIFICIAL TEARS OPHTHALMIC OINT
1.0000 "application " | TOPICAL_OINTMENT | Freq: Every day | OPHTHALMIC | Status: DC
  Administered 2018-06-21: 1 via OPHTHALMIC
  Filled 2018-06-21: qty 3.5

## 2018-06-21 MED ORDER — HYDRALAZINE HCL 20 MG/ML IJ SOLN
10.0000 mg | INTRAMUSCULAR | Status: DC | PRN
  Administered 2018-06-21 – 2018-06-22 (×3): 20 mg via INTRAVENOUS
  Filled 2018-06-21 (×3): qty 1

## 2018-06-21 MED ORDER — LABETALOL HCL 5 MG/ML IV SOLN
5.0000 mg | Freq: Once | INTRAVENOUS | Status: AC
  Administered 2018-06-21: 5 mg via INTRAVENOUS
  Filled 2018-06-21: qty 4

## 2018-06-21 MED ORDER — ATORVASTATIN CALCIUM 40 MG PO TABS
40.0000 mg | ORAL_TABLET | Freq: Every day | ORAL | Status: DC
  Administered 2018-06-21: 40 mg via ORAL
  Filled 2018-06-21: qty 1

## 2018-06-21 MED ORDER — ACETAMINOPHEN 325 MG PO TABS
650.0000 mg | ORAL_TABLET | ORAL | Status: DC | PRN
  Administered 2018-06-22: 650 mg via ORAL
  Filled 2018-06-21: qty 2

## 2018-06-21 MED ORDER — SODIUM CHLORIDE 0.9 % IV SOLN
10.0000 mL/h | Freq: Once | INTRAVENOUS | Status: AC
  Administered 2018-06-21: 10 mL/h via INTRAVENOUS

## 2018-06-21 MED ORDER — CARBAMAZEPINE 200 MG PO TABS
200.0000 mg | ORAL_TABLET | Freq: Two times a day (BID) | ORAL | Status: DC
  Administered 2018-06-21 – 2018-06-22 (×3): 200 mg via ORAL
  Filled 2018-06-21 (×4): qty 1

## 2018-06-21 MED ORDER — FUROSEMIDE 10 MG/ML IJ SOLN
20.0000 mg | Freq: Once | INTRAMUSCULAR | Status: AC
  Administered 2018-06-21: 20 mg via INTRAVENOUS
  Filled 2018-06-21: qty 2

## 2018-06-21 MED ORDER — POLYETHYLENE GLYCOL 3350 17 G PO PACK: 17.0000 g | PACK | Freq: Every day | ORAL | Status: DC

## 2018-06-21 MED ORDER — SODIUM CHLORIDE 0.9% IV SOLUTION
Freq: Once | INTRAVENOUS | Status: AC
  Administered 2018-06-21: 13:00:00 via INTRAVENOUS

## 2018-06-21 MED ORDER — ORAL CARE MOUTH RINSE
15.0000 mL | Freq: Two times a day (BID) | OROMUCOSAL | Status: DC
  Administered 2018-06-21 – 2018-06-22 (×2): 15 mL via OROMUCOSAL

## 2018-06-21 MED ORDER — POLYETHYLENE GLYCOL 3350 17 G PO PACK: 34.0000 g | PACK | ORAL | Status: AC

## 2018-06-21 MED ORDER — ONDANSETRON HCL 4 MG PO TABS: 4.0000 mg | ORAL_TABLET | Freq: Four times a day (QID) | ORAL | Status: DC | PRN

## 2018-06-21 MED ORDER — POLYETHYLENE GLYCOL 3350 17 G PO PACK: 17.0000 g | PACK | Freq: Every day | ORAL | Status: DC | PRN

## 2018-06-21 MED ORDER — MAGNESIUM HYDROXIDE 400 MG/5ML PO SUSP: 30.0000 mL | Freq: Every day | ORAL | Status: DC | PRN

## 2018-06-21 MED ORDER — SERTRALINE HCL 25 MG PO TABS
25.0000 mg | ORAL_TABLET | Freq: Every day | ORAL | Status: DC
  Administered 2018-06-21 – 2018-06-22 (×2): 25 mg via ORAL
  Filled 2018-06-21 (×2): qty 1

## 2018-06-21 MED ORDER — METOPROLOL TARTRATE 12.5 MG HALF TABLET
12.5000 mg | ORAL_TABLET | Freq: Two times a day (BID) | ORAL | Status: DC
  Administered 2018-06-21 – 2018-06-22 (×3): 12.5 mg via ORAL
  Filled 2018-06-21 (×3): qty 1

## 2018-06-21 MED ORDER — CALCIUM POLYCARBOPHIL 625 MG PO TABS
625.0000 mg | ORAL_TABLET | Freq: Two times a day (BID) | ORAL | Status: DC
  Administered 2018-06-21 – 2018-06-22 (×3): 625 mg via ORAL
  Filled 2018-06-21 (×4): qty 1

## 2018-06-21 MED ORDER — SENNA 8.6 MG PO TABS
1.0000 | ORAL_TABLET | Freq: Two times a day (BID) | ORAL | Status: DC
  Administered 2018-06-21 – 2018-06-22 (×3): 8.6 mg via ORAL
  Filled 2018-06-21 (×3): qty 1

## 2018-06-21 MED ORDER — SERTRALINE HCL 100 MG PO TABS
100.0000 mg | ORAL_TABLET | Freq: Every day | ORAL | Status: DC
  Administered 2018-06-21 – 2018-06-22 (×2): 100 mg via ORAL
  Filled 2018-06-21 (×2): qty 1

## 2018-06-21 MED ORDER — PANTOPRAZOLE SODIUM 40 MG PO TBEC
40.0000 mg | DELAYED_RELEASE_TABLET | Freq: Every day | ORAL | Status: DC
  Administered 2018-06-21 – 2018-06-22 (×2): 40 mg via ORAL
  Filled 2018-06-21 (×2): qty 1

## 2018-06-21 MED ORDER — GABAPENTIN 300 MG PO CAPS
300.0000 mg | ORAL_CAPSULE | Freq: Two times a day (BID) | ORAL | Status: DC
  Administered 2018-06-21 – 2018-06-22 (×3): 300 mg via ORAL
  Filled 2018-06-21 (×3): qty 1

## 2018-06-21 MED ORDER — CALCIUM CARBONATE-VITAMIN D 500-200 MG-UNIT PO TABS
1.0000 | ORAL_TABLET | Freq: Every day | ORAL | Status: DC
  Administered 2018-06-21 – 2018-06-22 (×2): 1 via ORAL
  Filled 2018-06-21 (×2): qty 1

## 2018-06-21 NOTE — H&P (Signed)
History and Physical    Wendy Erickson FAO:[id number] DOB: 10-17-1940 DOA: 06/20/2018  PCP: System, Pcp Not In  Patient coming from: [place]  I have personally briefly reviewed patient's old medical records in [place]  Chief Complaint: Anemia  HPI: Wendy Erickson is a 77 y.o. female with medical history significant of SCA-3, HTN, L renal neoplasm, dysphagia.  Patient has been feeling tired over the past 2 weeks, this has become progressively worse and she started to develop SOB last week that has also been worsening.  Near syncopal episode while sitting down a day or two ago.  Lab work at [facility] showed HGB to be 3.7.  Patient without history of anemia or needing blood transfusion previously.  On baby ASA but no other anticoagulants.  No melena, hematochezia.   ED Course: Hemoccult neg.  HGB 3.6 confirmed on our labs.  2u PRBC transfusion ordered and started in ED.   Review of Systems: As per HPI otherwise 10 point review of systems negative.   Past Medical History:  Diagnosis Date  . Abnormality of gait 12/05/2014  . Arthritis    "knees" (05/14/2014)  . 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 Chronic kidney disease    left renal neoplasm  . Depression   . Dysphagia, pharyngoesophageal phase 12/05/2014  . Foot fracture, right   . Gait disorder   . Heart murmur   . High cholesterol   . History of gout   . Hypertension   . 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 Osteopenia   . Peripheral neuropathy   . 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 Shingles    Right V1 distribution  . Trigeminal neuralgia of right side of face 06/09/2015   V1 distribution    Past Surgical History:  Procedure Laterality Date  . BACK SURGERY    . CATARACT EXTRACTION W/ INTRAOCULAR LENS IMPLANT Left   . DILATION AND CURETTAGE OF UTERUS    . KNEE ARTHROSCOPY Bilateral   . LAPAROSCOPIC NEPHRECTOMY  10/06/2011   Procedure: LAPAROSCOPIC NEPHRECTOMY;   Surgeon: Dutch Gray, MD;  Location: [facility];  Service: Urology;  Laterality: Left;  LEFT LAPAROSCOPIC RADICAL NEPHRECTOMY   . LUMBAR DISC SURGERY     "herniated disc"  . TONSILLECTOMY    . VAGINAL HYSTERECTOMY       reports that she quit smoking about 12 years ago. Her smoking use included cigarettes. She has a 40.00 pack-year smoking history. She has never used smokeless tobacco. She reports that she does not drink alcohol or use drugs.  Allergies  Allergen Reactions  . Pineapple     Family History  Problem Relation Age of Onset  . Ataxia Father   . Dementia Mother      Prior to Admission medications   Medication Sig Start Date End Date Taking? Authorizing Provider  acetaminophen (TYLENOL) 325 MG tablet Take 650 mg by mouth every 4 (four) hours as needed for moderate pain or fever (max 10 in 24 hours).   Yes [provider]  aspirin EC 81 MG tablet Take 81 mg by mouth daily.   Yes   atorvastatin (LIPITOR) 40 MG tablet Take 40 mg by mouth at bedtime.    Yes [provider]  Calcium Carbonate-Vitamin D (CALCARB 600/D) 600-400 MG-UNIT per tablet Take 1 tablet by mouth daily.    Yes [provider]  carbamazepine (TEGRETOL) 200 MG tablet Take 1 tablet (200 mg total)  by mouth 2 (two) times daily. 06/05/14  Yes Kathrynn Ducking, MD  cetirizine (ZYRTEC) 10 MG tablet Take 10 mg by mouth at bedtime.    Yes [provider]  ENSURE (ENSURE) Take 237 mLs by mouth 3 (three) times daily between meals.   Yes [provider]  gabapentin (NEURONTIN) 300 MG capsule Take 300 mg by mouth 2 (two) times daily.    Yes [provider]  hypromellose (GENTEAL SEVERE) 0.3 % GEL ophthalmic ointment Place 1 application into both eyes at bedtime.   Yes [provider]  magnesium hydroxide (MILK OF MAGNESIA) 400 MG/5ML suspension Take 30 mLs by mouth daily as needed for mild constipation.   Yes [provider]  Melatonin 1 MG TABS Take 1 mg  by mouth at bedtime.    Yes [provider]  metoprolol tartrate (LOPRESSOR) 25 MG tablet Take 0.5 tablets (12.5 mg total) by mouth 2 (two) times daily. 05/27/16  Yes Bonnielee Haff, MD  ondansetron (ZOFRAN) 4 MG tablet Take 4 mg by mouth 4 (four) times daily as needed for nausea or vomiting.   Yes [provider]  pantoprazole (PROTONIX) 40 MG tablet Take 40 mg by mouth daily.   Yes [provider]  polycarbophil (FIBERCON) 625 MG tablet Take 625 mg by mouth 2 (two) times daily.    Yes [provider]  polyethylene glycol (MIRALAX) packet Take 17 g by mouth daily as needed for moderate constipation. 05/14/14  Yes Domenic Polite, MD  senna (SENOKOT) 8.6 MG tablet Take 1 tablet by mouth 2 (two) times daily.    Yes [provider]  sertraline (ZOLOFT) 100 MG tablet Take 100 mg by mouth daily. Take with 25 mg to equal 125 mg   Yes [provider]  sertraline (ZOLOFT) 25 MG tablet Take 25 mg by mouth daily. Take with 100 mg to equal 125 mg   Yes [provider]    Physical Exam: Vitals:   06/21/18 0330 06/21/18 0345 06/21/18 0400 06/21/18 0403  BP: (!) 190/84 (!) 195/73 (!) 198/67 (!) 198/67  Pulse: 63 65 67 66  Resp: 14 13 14 15   Temp:    98.4 F (36.9 C)  TempSrc:    Oral  SpO2: 97% 98%  95%    Constitutional: NAD, calm, comfortable Eyes: PERRL, lids and conjunctivae pale ENMT: Mucous membranes are moist. Posterior pharynx clear of any exudate or lesions.Normal dentition.  Neck: normal, supple, no masses, no thyromegaly Respiratory: clear to auscultation bilaterally, no wheezing, no crackles. Normal respiratory effort. No accessory muscle use.  Cardiovascular: Regular rate and rhythm, no murmurs / rubs / gallops. No extremity edema. 2+ pedal pulses. No carotid bruits.  Abdomen: no tenderness, no masses palpated. No hepatosplenomegaly. Bowel sounds positive.  Musculoskeletal: no clubbing / cyanosis. No joint deformity upper  and lower extremities. Good ROM, no contractures. Normal muscle tone.  Skin: no rashes, lesions, ulcers. No induration Neurologic: CN 2-12 grossly intact. Sensation intact, DTR normal. Strength 5/5 in all 4.  Psychiatric: Normal judgment and insight. Alert and oriented x 3. Normal mood.    Labs on Admission: I have personally reviewed following labs and imaging studies  CBC: Recent Labs  Lab 06/20/18 2325  WBC 13.1*  NEUTROABS 11.4*  HGB 3.6*  HCT 15.1*  MCV 66.5*  PLT 202*   Basic Metabolic Panel: Recent Labs  Lab 06/20/18 2325  NA 139  K 4.2  CL 107  CO2 25  GLUCOSE 112*  BUN  29*  CREATININE 1.14*  CALCIUM 8.7*   GFR: CrCl cannot be calculated (Unknown ideal weight.). Liver Function Tests: Recent Labs  Lab 06/20/18 2325  AST 19  ALT 16  ALKPHOS 48  BILITOT 0.4  PROT 5.9*  ALBUMIN 3.0*   No results for input(s): LIPASE, AMYLASE in the last 168 hours. No results for input(s): AMMONIA in the last 168 hours. Coagulation Profile: No results for input(s): INR, PROTIME in the last 168 hours. Cardiac Enzymes: No results for input(s): CKTOTAL, CKMB, CKMBINDEX, TROPONINI in the last 168 hours. BNP (last 3 results) No results for input(s): PROBNP in the last 8760 hours. HbA1C: No results for input(s): HGBA1C in the last 72 hours. CBG: No results for input(s): GLUCAP in the last 168 hours. Lipid Profile: No results for input(s): CHOL, HDL, LDLCALC, TRIG, CHOLHDL, LDLDIRECT in the last 72 hours. Thyroid Function Tests: No results for input(s): TSH, T4TOTAL, FREET4, T3FREE, THYROIDAB in the last 72 hours. Anemia Panel: No results for input(s): VITAMINB12, FOLATE, FERRITIN, TIBC, IRON, RETICCTPCT in the last 72 hours. Urine analysis:    Component Value Date/Time   COLORURINE YELLOW 10/29/2014 0816   APPEARANCEUR CLOUDY (A) 10/29/2014 0816   LABSPEC 1.015 10/29/2014 0816   PHURINE 6.0 10/29/2014 0816   GLUCOSEU NEGATIVE 10/29/2014 0816   HGBUR NEGATIVE  10/29/2014 0816   BILIRUBINUR NEGATIVE 10/29/2014 0816   KETONESUR NEGATIVE 10/29/2014 0816   PROTEINUR NEGATIVE 10/29/2014 0816   UROBILINOGEN 0.2 10/29/2014 0816   NITRITE POSITIVE (A) 10/29/2014 0816   LEUKOCYTESUR SMALL (A) 10/29/2014 0816    Radiological Exams on Admission: Dg Chest 2 View  Result Date: 06/20/2018 CLINICAL DATA:  Increased lethargy over the past few days. Low hemoglobin. EXAM: CHEST - 2 VIEW COMPARISON:  01/20/2017 FINDINGS: Stable heart size. Aortic atherosclerosis at the arch without aneurysm. Mild prominence of the interstitium, chronic in appearance. Linear platelike atelectasis or scarring is noted in the right middle lobe. Trace posterior pleural effusions or pleural thickening is noted posteriorly. IMPRESSION: Aortic atherosclerosis. Platelike atelectasis and/or scarring in the right middle lobe. Chronic mild interstitial prominence. Electronically Signed   By: Ashley Royalty M.D.   On: 06/20/2018 23:47    EKG: Independently reviewed.  Assessment/Plan Principal Problem:   Symptomatic anemia Active Problems:   HTN (hypertension)   SCA-3 (spinocerebellar ataxia type 3) ([facility])   Dysphagia, pharyngoesophageal phase   Microcytic hypochromic anemia    1. Symptomatic microcytic hypochromic anemia - 1. Hemoccult neg and no acute GIB 2. Transfusing 2u PRBC 3. CBC at 0900 after transfusion 4. Needs follow up iron studies at a minimum, wernt obtained today prior to transfusion being started however. 5. Consider GI consult. 2. HTN - 1. Continue home metoprolol 2. Lasix 20mg  IV x1 now (on 2nd U PRBC transfusion currently) 3. Hydralazine PRN 3. SCA-3 - 1. Chronic and baseline, followed by Dr. Jannifer Franklin 4. Dysphagia - 1. Patient states she eats a "regular" diet 2. Aug swallow screen recd mechanical soft (dysphagia 3) diet.  Will go ahead and put her on the mechanical soft diet for now.  DVT prophylaxis: SCDs Code Status: DNR Family Communication: No family in  room Disposition Plan: Back to ECF after admit Consults called: None Admission status: Place in obs   Railee Bonillas, [facility] Pager [phone] Only works nights!  If 7AM-7PM, please contact the primary day team physician taking care of patient  [email or web address] Password [facility]  06/21/2018, 4:28 AM

## 2018-06-21 NOTE — Progress Notes (Addendum)
PROGRESS NOTE                                                                                                                                                                                                             Patient Demographics:    Wendy Erickson, is a 77 y.o. female, DOB - Dec 29, 1940, OAC:[id number]  Admit date - 06/20/2018   Admitting Physician Etta Quill, DO  Outpatient Primary MD for the patient is System, Pcp Not In  LOS - 0   Chief Complaint  Patient presents with  . Abnormal Lab       Brief Narrative    This is a no charge note as patient admitted earlier today 77 y.o. female with medical history significant of SCA-3, HTN, L renal neoplasm, dysphagia. Patient lives at [facility], she had weakness, near syncope, shortness of breath at facility, her hemoglobin came back at 3.7, she was sent for further work-up, patient is poor historian cannot give any reliable history, does not recall colonoscopy/endoscopy, her Hemoccult was negative in ED, no evidence of significant GI bleed, admitted for further management.    Subjective:    Wendy Erickson today has, No headache, No chest pain, No abdominal pain - No Nausea, she reports generalized weakness, but currently no chest pain or shortness of breath.   Assessment  & Plan :    Principal Problem:   Symptomatic anemia Active Problems:   HTN (hypertension)   SCA-3 (spinocerebellar ataxia type 3) (HCC)   Dysphagia, pharyngoesophageal phase   Microcytic hypochromic anemia  Symptomatic anemia -NO Clear etiology, unclear with patient previous labs at her facility, the chance of significant GI bleed, she was Hemoccult negative in ED, will repeat Hemoccult . -He is already transfused 2 units PRBC with good response, symptoms has significantly improved, and this point sending anemia panel will not be helpful . -Try to discuss with your facility if he had any further work-up, and  with her level, . -Resume aspirin if there is no evidence of GI bleed and second Hemoccult come back negative  Hypertension -Pressure acceptable, continue with metoprolol  Final cerebellar ataxia type III -Chronic, at baseline, she follows with Dr. Jannifer Franklin  Hyperlipidemia -With home statin  Depression -Continue with home meds  Neuropathy -Continue with Neurontin  History of renal cell carcinoma -status post left-sided nephrectomy  Code Status : DNR  Family Communication  : D/W son via phone  Disposition Plan  : back to [facility] home when stable  Consults  :  none  Procedures  : none  DVT Prophylaxis  :  SCD  Lab Results  Component Value Date   PLT 405 (H) 06/20/2018    Antibiotics  :    Anti-infectives (From admission, onward)   None        Objective:   Vitals:   06/21/18 0906 06/21/18 1147 06/21/18 1244 06/21/18 1324  BP: (!) 163/90 (!) 156/87 123/79 (!) 134/47  Pulse: 79 76 65 65  Resp: 16  16 15   Temp:   98.4 F (36.9 C) 98.1 F (36.7 C)  TempSrc:   Oral Oral  SpO2: 99%  95% 100%  Weight:      Height:        Wt Readings from Last 3 Encounters:  06/21/18 80.1 kg  05/25/16 72.6 kg  05/14/14 73.1 kg     Intake/Output Summary (Last 24 hours) at 06/21/2018 1330 Last data filed at 06/21/2018 1244 Gross per 24 hour  Intake 1360 ml  Output -  Net 1360 ml     Physical Exam  Awake Alert, Oriented X 3, sleeping in bed in no apparent distress  Symmetrical Chest wall movement, Good air movement bilaterally, CTAB RRR,No Gallops,Rubs or new Murmurs, No Parasternal Heave +ve B.Sounds, Abd Soft, No tenderness, No rebound - guarding or rigidity. No Cyanosis, Clubbing or edema, No new Rash or bruise      Data Review:    CBC Recent Labs  Lab 06/20/18 2325 06/21/18 0743  WBC 13.1*  --   HGB 3.6* 7.5*  HCT 15.1* 26.7*  PLT 405*  --   MCV 66.5*  --   MCH 15.9*  --   MCHC 23.8*  --   RDW 21.0*  --   LYMPHSABS 1.4  --   MONOABS 0.3   --   EOSABS 0.0  --   BASOSABS 0.0  --     Chemistries  Recent Labs  Lab 06/20/18 2325  NA 139  K 4.2  CL 107  CO2 25  GLUCOSE 112*  BUN 29*  CREATININE 1.14*  CALCIUM 8.7*  AST 19  ALT 16  ALKPHOS 48  BILITOT 0.4   ------------------------------------------------------------------------------------------------------------------ No results for input(s): CHOL, HDL, LDLCALC, TRIG, CHOLHDL, LDLDIRECT in the last 72 hours.  No results found for: HGBA1C ------------------------------------------------------------------------------------------------------------------ No results for input(s): TSH, T4TOTAL, T3FREE, THYROIDAB in the last 72 hours.  Invalid input(s): FREET3 ------------------------------------------------------------------------------------------------------------------ No results for input(s): VITAMINB12, FOLATE, FERRITIN, TIBC, IRON, RETICCTPCT in the last 72 hours.  Coagulation profile No results for input(s): INR, PROTIME in the last 168 hours.  No results for input(s): DDIMER in the last 72 hours.  Cardiac Enzymes No results for input(s): CKMB, TROPONINI, MYOGLOBIN in the last 168 hours.  Invalid input(s): CK ------------------------------------------------------------------------------------------------------------------ No results found for: BNP  Inpatient Medications  Scheduled Meds: . artificial tears  1 application Both Eyes QHS  . atorvastatin  40 mg Oral QHS  . calcium-vitamin D  1 tablet Oral Daily  . carbamazepine  200 mg Oral BID  . gabapentin  300 mg Oral BID  . metoprolol tartrate  12.5 mg Oral BID  . pantoprazole  40 mg Oral Daily  . polycarbophil  625 mg Oral BID  . senna  1 tablet Oral BID  . sertraline  100 mg Oral Daily  . sertraline  25 mg  Oral Daily   Continuous Infusions: PRN Meds:.acetaminophen, hydrALAZINE, magnesium hydroxide, ondansetron, polyethylene glycol  Micro Results Recent Results (from the past 240 hour(s))    MRSA PCR Screening     Status: None   Collection Time: 06/21/18  6:26 AM  Result Value Ref Range Status   MRSA by PCR NEGATIVE NEGATIVE Final    Comment:        The GeneXpert MRSA Assay (FDA approved for NASAL specimens only), is one component of a comprehensive MRSA colonization surveillance program. It is not intended to diagnose MRSA infection nor to guide or monitor treatment for MRSA infections. Performed at [facility], [place] [address], [place], [place] [address]     Radiology Reports Dg Chest 2 View  Result Date: 06/20/2018 CLINICAL DATA:  Increased lethargy over the past few days. Low hemoglobin. EXAM: CHEST - 2 VIEW COMPARISON:  01/20/2017 FINDINGS: Stable heart size. Aortic atherosclerosis at the arch without aneurysm. Mild prominence of the interstitium, chronic in appearance. Linear platelike atelectasis or scarring is noted in the right middle lobe. Trace posterior pleural effusions or pleural thickening is noted posteriorly. IMPRESSION: Aortic atherosclerosis. Platelike atelectasis and/or scarring in the right middle lobe. Chronic mild interstitial prominence. Electronically Signed   By: Ashley Royalty M.D.   On: 06/20/2018 23:47     Phillips Climes M.D on 06/21/2018 at 1:30 PM  Between 7am to 7pm - Pager - [phone]  After 7pm go to [email or web address] - password [facility]  [facility] -  Office  [phone]

## 2018-06-21 NOTE — Care Management Obs Status (Signed)
[facility] NOTIFICATION   Patient Details  Name: Ludia Gartland MRN: [id number] Date of Birth: 06-12-1941   Medicare Observation Status Notification Given:  Yes    Carles Collet, RN 06/21/2018, 1:59 PM

## 2018-06-22 DIAGNOSIS — R0602 Shortness of breath: Secondary | ICD-10-CM | POA: Diagnosis not present

## 2018-06-22 DIAGNOSIS — D509 Iron deficiency anemia, unspecified: Secondary | ICD-10-CM | POA: Diagnosis not present

## 2018-06-22 DIAGNOSIS — I10 Essential (primary) hypertension: Secondary | ICD-10-CM | POA: Diagnosis not present

## 2018-06-22 DIAGNOSIS — Z7401 Bed confinement status: Secondary | ICD-10-CM | POA: Diagnosis not present

## 2018-06-22 DIAGNOSIS — D649 Anemia, unspecified: Secondary | ICD-10-CM | POA: Diagnosis not present

## 2018-06-22 DIAGNOSIS — M2550 Pain in unspecified joint: Secondary | ICD-10-CM | POA: Diagnosis not present

## 2018-06-22 LAB — BASIC METABOLIC PANEL
ANION GAP: 11 (ref 5–15)
BUN: 16 mg/dL (ref 8–23)
CO2: 25 mmol/L (ref 22–32)
Calcium: 8.6 mg/dL — ABNORMAL LOW (ref 8.9–10.3)
Chloride: 101 mmol/L (ref 98–111)
Creatinine, Ser: 0.93 mg/dL (ref 0.44–1.00)
GFR calc Af Amer: >60 mL/min (ref >60)
GFR calc non Af Amer: 59 mL/min — ABNORMAL LOW (ref >60)
GLUCOSE: 119 mg/dL — AB (ref 70–99)
POTASSIUM: 3.4 mmol/L — AB (ref 3.5–5.1)
Sodium: 137 mmol/L (ref 135–145)

## 2018-06-22 LAB — CBC
HEMATOCRIT: 30 % — AB (ref 36.0–46.0)
Hemoglobin: 9.1 g/dL — ABNORMAL LOW (ref 12.0–15.0)
MCH: 22.5 pg — ABNORMAL LOW (ref 26.0–34.0)
MCHC: 30.3 g/dL (ref 30.0–36.0)
MCV: 74.3 fL — AB (ref 80.0–100.0)
NRBC: 1.1 % — AB (ref 0.0–0.2)
PLATELETS: 358 10*3/uL (ref 150–400)
RBC: 4.04 MIL/uL (ref 3.87–5.11)
RDW: 25.9 % — ABNORMAL HIGH (ref 11.5–15.5)
WBC: 14.8 10*3/uL — ABNORMAL HIGH (ref 4.0–10.5)

## 2018-06-22 LAB — GLUCOSE, CAPILLARY: Glucose-Capillary: 94 mg/dL (ref 70–99)

## 2018-06-22 MED ORDER — FERROUS SULFATE 325 (65 FE) MG PO TBEC: 325.0000 mg | DELAYED_RELEASE_TABLET | Freq: Two times a day (BID) | ORAL | 3 refills | Status: AC

## 2018-06-22 NOTE — Progress Notes (Signed)
Called report to receiving nurse at [facility].  Amanda Cockayne, RN

## 2018-06-22 NOTE — Discharge Instructions (Signed)
Follow with Primary MD at  SNF facility within 3 days  Get CBC, CMP,  checked  by Primary MD next visit.    Activity: As tolerated with Full fall precautions use walker/cane & assistance as needed   Disposition Home    Diet: Heart Healthy, dysphagia 3 with thin liquid, with feeding assistance and aspiration precautions.  For Heart failure patients - Check your Weight same time everyday, if you gain over 2 pounds, or you develop in leg swelling, experience more shortness of breath or chest pain, call your Primary MD immediately. Follow Cardiac Low Salt Diet and 1.5 lit/day fluid restriction.   On your next visit with your primary care physician please Get Medicines reviewed and adjusted.   Please request your Prim.MD to go over all Hospital Tests and Procedure/Radiological results at the follow up, please get all Hospital records sent to your Prim MD by signing hospital release before you go home.   If you experience worsening of your admission symptoms, develop shortness of breath, life threatening emergency, suicidal or homicidal thoughts you must seek medical attention immediately by calling 911 or calling your MD immediately  if symptoms less severe.  You Must read complete instructions/literature along with all the possible adverse reactions/side effects for all the Medicines you take and that have been prescribed to you. Take any new Medicines after you have completely understood and accpet all the possible adverse reactions/side effects.   Do not drive, operating heavy machinery, perform activities at heights, swimming or participation in water activities or provide baby sitting services if your were admitted for syncope or siezures until you have seen by Primary MD or a Neurologist and advised to do so again.  Do not drive when taking Pain medications.    Do not take more than prescribed Pain, Sleep and Anxiety Medications  Special Instructions: If you have smoked or chewed  Tobacco  in the last 2 yrs please stop smoking, stop any regular Alcohol  and or any Recreational drug use.  Wear Seat belts while driving.   Please note  You were cared for by a hospitalist during your hospital stay. If you have any questions about your discharge medications or the care you received while you were in the hospital after you are discharged, you can call the unit and asked to speak with the hospitalist on call if the hospitalist that took care of you is not available. Once you are discharged, your primary care physician will handle any further medical issues. Please note that NO REFILLS for any discharge medications will be authorized once you are discharged, as it is imperative that you return to your primary care physician (or establish a relationship with a primary care physician if you do not have one) for your aftercare needs so that they can reassess your need for medications and monitor your lab values.

## 2018-06-22 NOTE — NC FL2 (Signed)
[place] LEVEL OF CARE SCREENING TOOL     IDENTIFICATION  Patient Name: Wendy Erickson Birthdate: 11-Nov-1940 Sex: female Admission Date (Current Location): 06/20/2018  [facility] and [place] Number:  Herbalist and Address:  [facility]. [facility], [place] [address], [place],  [address]      Provider Number: [id number]  Attending Physician Name and Address:  Elgergawy, Silver Huguenin, MD  Relative Name and Phone Number:       Current Level of Care: Hospital Recommended Level of Care: [place] Prior Approval Number:    Date Approved/Denied:   PASRR Number: [id number]  Discharge Plan: SNF    Current Diagnoses: Patient Active Problem List   Diagnosis Date Noted  . Symptomatic anemia 06/21/2018  . Microcytic hypochromic anemia 06/21/2018  . Fall   . Subarachnoid hemorrhage ([place]) 05/25/2016  . Subdural hematoma ([place]) 05/25/2016  . SAH (subarachnoid hemorrhage) ([place]) 05/25/2016  . Laceration of face-right supraorbital 05/25/2016  . Trigeminal neuralgia of right side of face 06/09/2015  . Abnormality of gait 12/05/2014  . Dysphagia, pharyngoesophageal phase 12/05/2014  . Cholelithiasis 05/14/2014  . Abdominal pain, epigastric 05/14/2014  . Systolic murmur 56/21/3086  . Chest pain 05/11/2014  . Atypical chest pain 05/11/2014  . SCA-3 (spinocerebellar ataxia type 3) ([place]) 05/13/2013  . Hydronephrosis of right kidney 04/13/2013  . Absent kidney, acquired 04/13/2013  . Pyelonephritis 04/11/2013  . AKI (acute kidney injury) ([place]) 04/11/2013  . HTN (hypertension) 04/11/2013    Orientation RESPIRATION BLADDER Height & Weight     Self, Time, Situation, Place  O2(nasal cannula 2L) Continent Weight: 80.1 kg Height:  5\' 5"  (165.1 cm)  BEHAVIORAL SYMPTOMS/MOOD NEUROLOGICAL BOWEL NUTRITION STATUS      Continent Diet(please see DC summary)  AMBULATORY STATUS COMMUNICATION OF NEEDS Skin   (baseline) Verbally Normal                      Personal Care Assistance Level of Assistance  (baseline)           Functional Limitations Info  Sight, Hearing, Speech Sight Info: Adequate Hearing Info: Adequate Speech Info: Adequate    SPECIAL CARE FACTORS FREQUENCY                       Contractures Contractures Info: Not present    Additional Factors Info  Psychotropic Code Status Info: DNR Allergies Info: Pineapple Psychotropic Info: zoloft         Current Medications (06/22/2018):  This is the current hospital active medication list Current Facility-Administered Medications  Medication Dose Route Frequency Provider Last Rate Last Dose  . acetaminophen (TYLENOL) tablet 650 mg  650 mg Oral Q4H PRN Etta Quill, DO   650 mg at 06/22/18 1155  . artificial tears (LACRILUBE) ophthalmic ointment 1 application  1 application Both Eyes QHS Etta Quill, DO   1 application at 57/84/69 2132  . atorvastatin (LIPITOR) tablet 40 mg  40 mg Oral QHS Etta Quill, DO   40 mg at 06/21/18 2129  . calcium-vitamin D (OSCAL WITH D) 500-200 MG-UNIT per tablet 1 tablet  1 tablet Oral Daily Etta Quill, DO   1 tablet at 06/22/18 6295  . carbamazepine (TEGRETOL) tablet 200 mg  200 mg Oral BID Jennette Kettle M, DO   200 mg at 06/22/18 2841  . gabapentin (NEURONTIN) capsule 300 mg  300 mg Oral BID Etta Quill, DO   300  mg at 06/22/18 0924  . hydrALAZINE (APRESOLINE) injection 10-20 mg  10-20 mg Intravenous Q4H PRN Etta Quill, DO   20 mg at 06/22/18 0126  . magnesium hydroxide (MILK OF MAGNESIA) suspension 30 mL  30 mL Oral Daily PRN Etta Quill, DO      . MEDLINE mouth rinse  15 mL Mouth Rinse BID Elgergawy, Silver Huguenin, MD   15 mL at 06/22/18 0935  . metoprolol tartrate (LOPRESSOR) tablet 12.5 mg  12.5 mg Oral BID Jennette Kettle M, DO   12.5 mg at 06/22/18 3383  . ondansetron (ZOFRAN) tablet 4 mg  4 mg Oral QID PRN Etta Quill, DO      . pantoprazole (PROTONIX) EC tablet 40 mg  40 mg  Oral Daily Jennette Kettle M, DO   40 mg at 06/22/18 2919  . polycarbophil (FIBERCON) tablet 625 mg  625 mg Oral BID Etta Quill, DO   625 mg at 06/22/18 1660  . polyethylene glycol (MIRALAX / GLYCOLAX) packet 17 g  17 g Oral Daily PRN Etta Quill, DO      . senna (SENOKOT) tablet 8.6 mg  1 tablet Oral BID Jennette Kettle M, DO   8.6 mg at 06/22/18 6004  . sertraline (ZOLOFT) tablet 100 mg  100 mg Oral Daily Jennette Kettle M, DO   100 mg at 06/22/18 5997  . sertraline (ZOLOFT) tablet 25 mg  25 mg Oral Daily Jennette Kettle M, DO   25 mg at 06/22/18 7414     Discharge Medications: Please see discharge summary for a list of discharge medications.  Relevant Imaging Results:  Relevant Lab Results:   Additional Information SSN: [id number]  Estanislado Emms, LCSW

## 2018-06-22 NOTE — Discharge Summary (Addendum)
Wendy Erickson, is a 77 y.o. female  DOB 07-22-1941  MRN [id number].  Admission date:  06/20/2018  Admitting Physician  Etta Quill, DO  Discharge Date:  06/22/2018   Primary MD  System, Pcp Not In  Recommendations for primary care physician for things to follow:  -Please check CBC, BMP in 3 days, to ensure stable hemoglobin, as well obtain anemia panel for further work-up, could not be obtained during hospital stay as she did receive blood transfusion. -Aspirin has been stopped at time of discharge, resumed by PCP once stable -will need further work-up for as unspecified anemia as an outpatient, obtain anemia panel, and will need her screening colonoscopy/endoscopy as an outpatient.  Admission Diagnosis  Shortness of breath [R06.02] Symptomatic anemia [D64.9]   Discharge Diagnosis  Shortness of breath [R06.02] Symptomatic anemia [D64.9]    Principal Problem:   Symptomatic anemia Active Problems:   HTN (hypertension)   SCA-3 (spinocerebellar ataxia type 3) (HCC)   Dysphagia, pharyngoesophageal phase   Microcytic hypochromic anemia      Past Medical History:  Diagnosis Date  . Abnormality of gait 12/05/2014  . Arthritis    "knees" (05/14/2014)  . 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 Chronic kidney disease    left renal neoplasm  . Depression   . Dysphagia, pharyngoesophageal phase 12/05/2014  . Foot fracture, right   . Gait disorder   . Heart murmur   . High cholesterol   . History of gout   . Hypertension   . Kidney malignancy ([place])    left renal neoplasm  . Osteopenia   . Peripheral neuropathy   . SCA-3 (spinocerebellar ataxia type 3) ([place]) 05/13/2013  . Shingles    Right V1 distribution  . Symptomatic anemia 06/21/2018  . Trigeminal neuralgia of right side of face 06/09/2015   V1 distribution    Past Surgical History:  Procedure Laterality Date  . BACK SURGERY      . CATARACT EXTRACTION W/ INTRAOCULAR LENS IMPLANT Left   . DILATION AND CURETTAGE OF UTERUS    . KNEE ARTHROSCOPY Bilateral   . LAPAROSCOPIC NEPHRECTOMY  10/06/2011   Procedure: LAPAROSCOPIC NEPHRECTOMY;  Surgeon: Dutch Gray, MD;  Location: [facility];  Service: Urology;  Laterality: Left;  LEFT LAPAROSCOPIC RADICAL NEPHRECTOMY   . LUMBAR DISC SURGERY     "herniated disc"  . TONSILLECTOMY    . VAGINAL HYSTERECTOMY         History of present illness and  Hospital Course:     Kindly see H&P for history of present illness and admission details, please review complete Labs, Consult reports and Test reports for all details in brief  HPI  from the history and physical done on the day of admission 06/20/2018 HPI: Wendy Erickson is a 77 y.o. female with medical history significant of SCA-3, HTN, L renal neoplasm, dysphagia.  Patient has been feeling tired over the past 2 weeks, this has become progressively worse and she started to develop SOB last week that has  also been worsening.  Near syncopal episode while sitting down a day or two ago.  Lab work at [facility] showed HGB to be 3.7.  Patient without history of anemia or needing blood transfusion previously.  On baby ASA but no other anticoagulants.  No melena, hematochezia.   ED Course: Hemoccult neg.  HGB 3.6 confirmed on our labs.  2u PRBC transfusion ordered and started in ED.   Hospital Course   77 y.o.femalewith medical history significant ofSCA-3, HTN, L renal neoplasm, dysphagia. Patient lives at [place] home, she had weakness, near syncope, shortness of breath at facility, her hemoglobin came back at 3.7, she was sent for further work-up, patient is poor historian cannot give any reliable history, does not recall colonoscopy/endoscopy, her Hemoccult was negative in ED, no evidence of significant GI bleed, admitted for further management.   Symptomatic microcytic anemia, unspecified -No clear etiology, but it does appear her  hemoglobin was slowly drifting down, as discussed with SNF staff, her hemoglobin was 7.1 in July 15, it was 3.6 on presentation, there is no evidence of significant GI bleed, she is Hemoccult negative x1, positive x1, no melena, coffee-ground emesis or hematemesis , she is with a solitary kidney, to be getting to her anemia, anemia panel has not been obtained she received immediate transfusion, she did receive 2 units PRBC, her hemoglobin is 9.1 on discharge. -He is started on iron supplements on discharge, aspirin has been held, to be resumed by PCP when stable, obtain anemia panel, work-up of microcytic anemia as an outpatient includes endoscopy/colonoscopy by primary team.  Hypertension -Pressure acceptable, continue with metoprolol  Final cerebellar ataxia type III -Chronic, at baseline, she follows with Dr. Jannifer Franklin  Hyperlipidemia -With home statin  Depression -Continue with home meds  Neuropathy -Continue with Neurontin  History of renal cell carcinoma -status post left-sided nephrectomy  Discharge Condition:  stable   Follow UP  Contact information for after-discharge care    Destination    [facility] .   Service:  Skilled Nursing Contact information: [address] [place] [phone]                Discharge Instructions  and  Discharge Medications    Discharge Instructions    Discharge instructions   Complete by:  As directed    Follow with Primary MD at  SNF facility within 3 days  Get CBC, CMP,  checked  by Primary MD next visit.    Activity: As tolerated with Full fall precautions use walker/cane & assistance as needed   Disposition Home    Diet: Heart Healthy, dysphagia 3 with thin liquid, with feeding assistance and aspiration precautions.  For Heart failure patients - Check your Weight same time everyday, if you gain over 2 pounds, or you develop in leg swelling, experience more  shortness of breath or chest pain, call your Primary MD immediately. Follow Cardiac Low Salt Diet and 1.5 lit/day fluid restriction.   On your next visit with your primary care physician please Get Medicines reviewed and adjusted.   Please request your Prim.MD to go over all Hospital Tests and Procedure/Radiological results at the follow up, please get all Hospital records sent to your Prim MD by signing hospital release before you go home.   If you experience worsening of your admission symptoms, develop shortness of breath, life threatening emergency, suicidal or homicidal thoughts you must seek medical attention immediately by calling 911 or calling your MD immediately  if symptoms less severe.  You Must read complete instructions/literature along with all the possible adverse reactions/side effects for all the Medicines you take and that have been prescribed to you. Take any new Medicines after you have completely understood and accpet all the possible adverse reactions/side effects.   Do not drive, operating heavy machinery, perform activities at heights, swimming or participation in water activities or provide baby sitting services if your were admitted for syncope or siezures until you have seen by Primary MD or a Neurologist and advised to do so again.  Do not drive when taking Pain medications.    Do not take more than prescribed Pain, Sleep and Anxiety Medications  Special Instructions: If you have smoked or chewed Tobacco  in the last 2 yrs please stop smoking, stop any regular Alcohol  and or any Recreational drug use.  Wear Seat belts while driving.   Please note  You were cared for by a hospitalist during your hospital stay. If you have any questions about your discharge medications or the care you received while you were in the hospital after you are discharged, you can call the unit and asked to speak with the hospitalist on call if the hospitalist that took care of you is  not available. Once you are discharged, your primary care physician will handle any further medical issues. Please note that NO REFILLS for any discharge medications will be authorized once you are discharged, as it is imperative that you return to your primary care physician (or establish a relationship with a primary care physician if you do not have one) for your aftercare needs so that they can reassess your need for medications and monitor your lab values.   Increase activity slowly   Complete by:  As directed      Allergies as of 06/22/2018      Reactions   Pineapple       Medication List    STOP taking these medications   aspirin EC 81 MG tablet     TAKE these medications   acetaminophen 325 MG tablet Commonly known as:  TYLENOL Take 650 mg by mouth every 4 (four) hours as needed for moderate pain or fever (max 10 in 24 hours).   atorvastatin 40 MG tablet Commonly known as:  LIPITOR Take 40 mg by mouth at bedtime.   CALCARB 600/D 600-400 MG-UNIT tablet Generic drug:  Calcium Carbonate-Vitamin D Take 1 tablet by mouth daily.   carbamazepine 200 MG tablet Commonly known as:  TEGRETOL Take 1 tablet (200 mg total) by mouth 2 (two) times daily.   cetirizine 10 MG tablet Commonly known as:  ZYRTEC Take 10 mg by mouth at bedtime.   ENSURE Take 237 mLs by mouth 3 (three) times daily between meals.   ferrous sulfate 325 (65 FE) MG EC tablet Take 1 tablet (325 mg total) by mouth 2 (two) times daily.   gabapentin 300 MG capsule Commonly known as:  NEURONTIN Take 300 mg by mouth 2 (two) times daily.   GENTEAL SEVERE 0.3 % Gel ophthalmic ointment Generic drug:  hypromellose Place 1 application into both eyes at bedtime.   magnesium hydroxide 400 MG/5ML suspension Commonly known as:  MILK OF MAGNESIA Take 30 mLs by mouth daily as needed for mild constipation.   Melatonin 1 MG Tabs Take 1 mg by mouth at bedtime.   metoprolol tartrate 25 MG tablet Commonly known as:   LOPRESSOR Take 0.5 tablets (12.5 mg total) by mouth 2 (  two) times daily.   ondansetron 4 MG tablet Commonly known as:  ZOFRAN Take 4 mg by mouth 4 (four) times daily as needed for nausea or vomiting.   pantoprazole 40 MG tablet Commonly known as:  PROTONIX Take 40 mg by mouth daily.   polycarbophil 625 MG tablet Commonly known as:  FIBERCON Take 625 mg by mouth 2 (two) times daily.   polyethylene glycol packet Commonly known as:  MIRALAX / GLYCOLAX Take 17 g by mouth daily as needed for moderate constipation.   senna 8.6 MG tablet Commonly known as:  SENOKOT Take 1 tablet by mouth 2 (two) times daily.   sertraline 100 MG tablet Commonly known as:  ZOLOFT Take 100 mg by mouth daily. Take with 25 mg to equal 125 mg   sertraline 25 MG tablet Commonly known as:  ZOLOFT Take 25 mg by mouth daily. Take with 100 mg to equal 125 mg         Diet and Activity recommendation: See Discharge Instructions above   Consults obtained - none  Major procedures and Radiology Reports - PLEASE review detailed and final reports for all details, in brief -     Ct Abdomen Pelvis Wo Contrast  Result Date: 06/21/2018 CLINICAL DATA:  Low hemoglobin EXAM: CT ABDOMEN AND PELVIS WITHOUT CONTRAST TECHNIQUE: Multidetector CT imaging of the abdomen and pelvis was performed following the standard protocol without IV contrast. COMPARISON:  11/25/2016 FINDINGS: Lower chest: Small left pleural effusion is noted. No focal infiltrate is seen. Hepatobiliary: Liver demonstrates scattered hypodensities consistent with small cysts. The gallbladder is within normal limits. Pancreas: Unremarkable. No pancreatic ductal dilatation or surrounding inflammatory changes. Spleen: Scattered calcified granulomas are noted within the spleen. No other focal abnormality is noted. Adrenals/Urinary Tract: Right adrenal gland demonstrates a focal adenoma stable from the prior exam. Left adrenal gland is within normal limits.  The left kidney has been surgically removed. The right kidney demonstrates hydronephrosis with a relatively normal appearing right ureter. No calculi are seen. 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 The overall appearance is stable from the prior exam. The bladder is well distended. Stomach/Bowel: The appendix is within normal limits. No obstructive changes are noted. Stomach is within normal limits. Vascular/Lymphatic: Aortic atherosclerosis. No enlarged abdominal or pelvic lymph nodes. Reproductive: Status post hysterectomy. No adnexal masses. Other: No abdominal wall hernia or abnormality. No abdominopelvic ascites. Musculoskeletal: Degenerative changes of the lumbar spine are noted. IMPRESSION: Small left pleural effusion. Status post left nephrectomy. Stable right adrenal adenoma. Stable right UPJ obstructive changes. Electronically Signed   By: Inez Catalina M.D.   On: 06/21/2018 19:43   Dg Chest 2 View  Result Date: 06/20/2018 CLINICAL DATA:  Increased lethargy over the past few days. Low hemoglobin. EXAM: CHEST - 2 VIEW COMPARISON:  01/20/2017 FINDINGS: Stable heart size. Aortic atherosclerosis at the arch without aneurysm. Mild prominence of the interstitium, chronic in appearance. Linear platelike atelectasis or scarring is noted in the right middle lobe. Trace posterior pleural effusions or pleural thickening is noted posteriorly. IMPRESSION: Aortic atherosclerosis. Platelike atelectasis and/or scarring in the right middle lobe. Chronic mild interstitial prominence. Electronically Signed   By: Ashley Royalty M.D.   On: 06/20/2018 23:47    Micro Results     Recent Results (from the past 240 hour(s))  MRSA PCR Screening     Status: None   Collection Time: 06/21/18  6:26 AM  Result Value Ref Range Status   MRSA by PCR  NEGATIVE NEGATIVE Final    Comment:        The GeneXpert MRSA Assay (FDA approved for NASAL specimens only), is one component of  a comprehensive MRSA colonization surveillance program. It is not intended to diagnose MRSA infection nor to guide or monitor treatment for MRSA infections. Performed at [facility], [place] [address], [place], [place] [address]        Today   Subjective:   Wendy Erickson today has no headache,no chest abdominal pain,no new weakness tingling or numbness, feels much better today.   Objective:   Blood pressure (!) 165/62, pulse 68, temperature 98.2 F (36.8 C), temperature source Oral, resp. rate 14, height 5\' 5"  (1.651 m), weight 80.1 kg, SpO2 95 %.   Intake/Output Summary (Last 24 hours) at 06/22/2018 1433 Last data filed at 06/22/2018 1424 Gross per 24 hour  Intake 605 ml  Output 1801 ml  Net -1196 ml    Exam Awake Alert, pleasant, laying in bed in no apparent distress Symmetrical Chest wall movement, Good air movement bilaterally, CTAB RRR,No Gallops,Rubs or new Murmurs, No Parasternal Heave +ve B.Sounds, Abd Soft, Non tender, No organomegaly appriciated, No rebound -guarding or rigidity. No Cyanosis, Clubbing or edema, No new Rash or bruise  Data Review   CBC w Diff:  Lab Results  Component Value Date   WBC 14.8 (H) 06/22/2018   HGB 9.1 (L) 06/22/2018   HGB 12.6 12/05/2014   HCT 30.0 (L) 06/22/2018   HCT 38.3 12/05/2014   PLT 358 06/22/2018   PLT 294 12/05/2014   LYMPHOPCT 11 06/20/2018   MONOPCT 2 06/20/2018   EOSPCT 0 06/20/2018   BASOPCT 0 06/20/2018    CMP:  Lab Results  Component Value Date   NA 137 06/22/2018   NA 147 (H) 12/05/2014   K 3.4 (L) 06/22/2018   CL 101 06/22/2018   CO2 25 06/22/2018   BUN 16 06/22/2018   BUN 24 12/05/2014   CREATININE 0.93 06/22/2018   PROT 5.9 (L) 06/20/2018   PROT 6.4 12/05/2014   ALBUMIN 3.0 (L) 06/20/2018   ALBUMIN 3.9 12/05/2014   BILITOT 0.4 06/20/2018   BILITOT <0.2 12/05/2014   ALKPHOS 48 06/20/2018   AST 19 06/20/2018   ALT 16 06/20/2018  .   Total Time in preparing paper work, data  evaluation and todays exam - 23 minutes  Phillips Climes M.D on 06/22/2018 at 2:33 PM  [facility]   Office  [phone]

## 2018-06-22 NOTE — Clinical Social Work Note (Signed)
Clinical Social Work Assessment  Patient Details  Name: Wendy Erickson MRN: [id number] Date of Birth: 07/27/1941  Date of referral:  06/22/18               Reason for consult:  Discharge Planning, Facility Placement                Permission sought to share information with:  Family Supports Permission granted to share information::  Yes, Verbal Permission Granted  Name::     Bill Seckel  Agency::  [facility]  Relationship::  son  Contact Information:  [phone]  Housing/Transportation Living arrangements for the past 2 months:  Skilled Nursing Facility Source of Information:  Patient Patient Interpreter Needed:  None Criminal Activity/Legal Involvement Pertinent to Current Situation/Hospitalization:  No - Comment as needed Significant Relationships:  Adult Children Lives with:  Facility Resident Do you feel safe going back to the place where you live?  Yes Need for family participation in patient care:  No (Coment)  Care giving concerns: Patient is a long term care resident at [facility].    Social Worker assessment / plan: CSW met with patient at bedside. Patient alert and oriented. CSW introduced self and role and discussed discharge planning. Patient is agreeable to return to [facility], as she is a resident there. CSW left message for patient's son.  CSW confirmed the facility will take patient back today. CSW to support with discharge.  Employment status:  Retired Insurance information:  Managed Medicare, Medicaid In State(BCBS) PT Recommendations:  Not assessed at this time Information / Referral to community resources:  Skilled Nursing Facility  Patient/Family's Response to care: Patient appreciative of care.  Patient/Family's Understanding of and Emotional Response to Diagnosis, Current Treatment, and Prognosis: Patient with understanding of her condition and agreeable to return to SNF.  Emotional Assessment Appearance:  Appears stated  age Attitude/Demeanor/Rapport:  Engaged Affect (typically observed):  Accepting, Calm Orientation:  Oriented to Self, Oriented to Place, Oriented to  Time, Oriented to Situation Alcohol / Substance use:  Not Applicable Psych involvement (Current and /or in the community):  No (Comment)  Discharge Needs  Concerns to be addressed:  Discharge Planning Concerns Readmission within the last 30 days:  No Current discharge risk:  Physical Impairment Barriers to Discharge:  No Barriers Identified   Susan Porter, LCSW 06/22/2018, 2:21 PM  

## 2018-06-22 NOTE — Clinical Social Work Placement (Signed)
   CLINICAL SOCIAL WORK PLACEMENT  NOTE  Date:  06/22/2018  Patient Details  Name: Wendy Erickson MRN: [id number] Date of Birth: July 15, 1941  Clinical Social Work is seeking post-discharge placement for this patient at the [place] level of care (*CSW will initial, date and re-position this form in  chart as items are completed):  Yes   Patient/family provided with [place] Work Department's list of facilities offering this level of care within the geographic area requested by the patient (or if unable, by the patient's family).  Yes   Patient/family informed of their freedom to choose among providers that offer the needed level of care, that participate in Medicare, Medicaid or managed care program needed by the patient, have an available bed and are willing to accept the patient.  Yes   Patient/family informed of [place]'s ownership interest in [facility] and [facility], as well as of the fact that they are under no obligation to receive care at these facilities.  PASRR submitted to EDS on       PASRR number received on       Existing PASRR number confirmed on 06/22/18     FL2 transmitted to all facilities in geographic area requested by pt/family on 06/22/18     FL2 transmitted to all facilities within larger geographic area on       Patient informed that his/her managed care company has contracts with or will negotiate with certain facilities, including the following:  [facility]     Yes   Patient/family informed of bed offers received.  Patient chooses bed at [facility]     Physician recommends and patient chooses bed at      Patient to be transferred to [facility] on 06/22/18.  Patient to be transferred to facility by [facility]     Patient family notified on 06/22/18 of transfer.  Name of family member notified:  Everlene Other, son     PHYSICIAN Please prepare priority discharge summary, including medications, Please prepare  prescriptions, Please sign FL2     Additional Comment:    _______________________________________________ Estanislado Emms, LCSW 06/22/2018, 2:23 PM

## 2018-06-22 NOTE — Progress Notes (Signed)
Patient will discharge back to [facility]. Anticipated discharge date: 06/22/18 Family notified: Carmine Youngberg (left message) Transportation by: [facility]  Nurse to call report to [phone].  CSW signing off.  Estanislado Emms, [place]  Clinical Social Worker

## 2018-06-24 LAB — TYPE AND SCREEN
ABO/RH(D): AB POS
ANTIBODY SCREEN: NEGATIVE
UNIT DIVISION: 0
UNIT DIVISION: 0
Unit division: 0
Unit division: 0
Unit division: 0

## 2018-06-24 LAB — BPAM RBC
Blood Product Expiration Date: 201912012359
Blood Product Expiration Date: 201912012359
Blood Product Expiration Date: 201912062359
Blood Product Expiration Date: 201912062359
Blood Product Expiration Date: 201912062359
ISSUE DATE / TIME: 201911140050
ISSUE DATE / TIME: 201911140345
ISSUE DATE / TIME: 201911141252
UNIT TYPE AND RH: 6200
Unit Type and Rh: 6200
Unit Type and Rh: 6200
Unit Type and Rh: 8400
Unit Type and Rh: 8400

## 2018-06-25 DIAGNOSIS — G3281 Cerebellar ataxia in diseases classified elsewhere: Secondary | ICD-10-CM | POA: Diagnosis not present

## 2018-06-25 DIAGNOSIS — D649 Anemia, unspecified: Secondary | ICD-10-CM | POA: Diagnosis not present

## 2018-06-25 DIAGNOSIS — Z79899 Other long term (current) drug therapy: Secondary | ICD-10-CM | POA: Diagnosis not present

## 2018-06-25 DIAGNOSIS — I10 Essential (primary) hypertension: Secondary | ICD-10-CM | POA: Diagnosis not present

## 2018-06-25 DIAGNOSIS — E785 Hyperlipidemia, unspecified: Secondary | ICD-10-CM | POA: Diagnosis not present

## 2018-06-26 DIAGNOSIS — M6281 Muscle weakness (generalized): Secondary | ICD-10-CM | POA: Diagnosis not present

## 2018-06-26 DIAGNOSIS — D649 Anemia, unspecified: Secondary | ICD-10-CM | POA: Diagnosis not present

## 2018-06-26 DIAGNOSIS — I10 Essential (primary) hypertension: Secondary | ICD-10-CM | POA: Diagnosis not present

## 2018-06-26 DIAGNOSIS — G3281 Cerebellar ataxia in diseases classified elsewhere: Secondary | ICD-10-CM | POA: Diagnosis not present

## 2018-06-27 ENCOUNTER — Emergency Department (HOSPITAL_COMMUNITY)
Admission: EM | Admit: 2018-06-27 | Discharge: 2018-06-28 | Disposition: A | Discharge status: Home / Self Care | Payer: Medicare Other | Attending: Emergency Medicine | Admitting: Emergency Medicine

## 2018-06-27 ENCOUNTER — Encounter (HOSPITAL_COMMUNITY): Payer: Self-pay

## 2018-06-27 ENCOUNTER — Emergency Department (HOSPITAL_COMMUNITY): Payer: Medicare Other

## 2018-06-27 ENCOUNTER — Other Ambulatory Visit: Payer: Self-pay

## 2018-06-27 DIAGNOSIS — R350 Frequency of micturition: Secondary | ICD-10-CM | POA: Insufficient documentation

## 2018-06-27 DIAGNOSIS — Z87891 Personal history of nicotine dependence: Secondary | ICD-10-CM | POA: Diagnosis not present

## 2018-06-27 DIAGNOSIS — M109 Gout, unspecified: Secondary | ICD-10-CM | POA: Diagnosis not present

## 2018-06-27 DIAGNOSIS — I639 Cerebral infarction, unspecified: Secondary | ICD-10-CM | POA: Diagnosis not present

## 2018-06-27 DIAGNOSIS — Z905 Acquired absence of kidney: Secondary | ICD-10-CM | POA: Insufficient documentation

## 2018-06-27 DIAGNOSIS — G3281 Cerebellar ataxia in diseases classified elsewhere: Secondary | ICD-10-CM | POA: Diagnosis not present

## 2018-06-27 DIAGNOSIS — I10 Essential (primary) hypertension: Secondary | ICD-10-CM | POA: Diagnosis not present

## 2018-06-27 DIAGNOSIS — Z79899 Other long term (current) drug therapy: Secondary | ICD-10-CM | POA: Insufficient documentation

## 2018-06-27 DIAGNOSIS — R531 Weakness: Secondary | ICD-10-CM | POA: Insufficient documentation

## 2018-06-27 DIAGNOSIS — R0902 Hypoxemia: Secondary | ICD-10-CM | POA: Diagnosis not present

## 2018-06-27 DIAGNOSIS — R4781 Slurred speech: Secondary | ICD-10-CM | POA: Diagnosis not present

## 2018-06-27 DIAGNOSIS — D649 Anemia, unspecified: Secondary | ICD-10-CM | POA: Diagnosis not present

## 2018-06-27 DIAGNOSIS — M11212 Other chondrocalcinosis, left shoulder: Secondary | ICD-10-CM | POA: Insufficient documentation

## 2018-06-27 DIAGNOSIS — M25512 Pain in left shoulder: Secondary | ICD-10-CM | POA: Diagnosis not present

## 2018-06-27 DIAGNOSIS — I959 Hypotension, unspecified: Secondary | ICD-10-CM | POA: Diagnosis not present

## 2018-06-27 DIAGNOSIS — M79622 Pain in left upper arm: Secondary | ICD-10-CM | POA: Diagnosis present

## 2018-06-27 DIAGNOSIS — R42 Dizziness and giddiness: Secondary | ICD-10-CM | POA: Diagnosis not present

## 2018-06-27 DIAGNOSIS — E785 Hyperlipidemia, unspecified: Secondary | ICD-10-CM | POA: Diagnosis not present

## 2018-06-27 LAB — CBC WITH DIFFERENTIAL/PLATELET
Abs Immature Granulocytes: 0 10*3/uL (ref 0.00–0.07)
BASOS PCT: 0 %
Basophils Absolute: 0 10*3/uL (ref 0.0–0.1)
Eosinophils Absolute: 0.3 10*3/uL (ref 0.0–0.5)
Eosinophils Relative: 2 %
HEMATOCRIT: 32 % — AB (ref 36.0–46.0)
Hemoglobin: 8.9 g/dL — ABNORMAL LOW (ref 12.0–15.0)
Lymphocytes Relative: 10 %
Lymphs Abs: 1.5 10*3/uL (ref 0.7–4.0)
MCH: 23.2 pg — ABNORMAL LOW (ref 26.0–34.0)
MCHC: 27.8 g/dL — ABNORMAL LOW (ref 30.0–36.0)
MCV: 83.3 fL (ref 80.0–100.0)
MONOS PCT: 4 %
Monocytes Absolute: 0.6 10*3/uL (ref 0.1–1.0)
NEUTROS ABS: 12.9 10*3/uL — AB (ref 1.7–7.7)
NEUTROS PCT: 84 %
NRBC: 0 /100{WBCs}
Platelets: 330 10*3/uL (ref 150–400)
RBC: 3.84 MIL/uL — AB (ref 3.87–5.11)
WBC: 15.3 10*3/uL — AB (ref 4.0–10.5)
nRBC: 0 % (ref 0.0–0.2)

## 2018-06-27 LAB — URINALYSIS, ROUTINE W REFLEX MICROSCOPIC
BILIRUBIN URINE: NEGATIVE
GLUCOSE, UA: NEGATIVE mg/dL
Hgb urine dipstick: NEGATIVE
KETONES UR: 5 mg/dL — AB
LEUKOCYTES UA: NEGATIVE
NITRITE: NEGATIVE
Protein, ur: 30 mg/dL — AB
Specific Gravity, Urine: 1.02 (ref 1.005–1.030)
pH: 5 (ref 5.0–8.0)

## 2018-06-27 LAB — BASIC METABOLIC PANEL
ANION GAP: 9 (ref 5–15)
BUN: 18 mg/dL (ref 8–23)
CO2: 25 mmol/L (ref 22–32)
Calcium: 8.8 mg/dL — ABNORMAL LOW (ref 8.9–10.3)
Chloride: 103 mmol/L (ref 98–111)
Creatinine, Ser: 0.8 mg/dL (ref 0.44–1.00)
GLUCOSE: 101 mg/dL — AB (ref 70–99)
POTASSIUM: 4.2 mmol/L (ref 3.5–5.1)
SODIUM: 137 mmol/L (ref 135–145)

## 2018-06-27 LAB — TROPONIN I: Troponin I: <0.03 ng/mL (ref <0.03)

## 2018-06-27 MED ORDER — HYDROCODONE-ACETAMINOPHEN 5-325 MG PO TABS
1.0000 | ORAL_TABLET | Freq: Once | ORAL | Status: AC
  Administered 2018-06-27: 1 via ORAL
  Filled 2018-06-27: qty 1

## 2018-06-27 NOTE — ED Notes (Signed)
Patient transported to X-ray 

## 2018-06-27 NOTE — ED Triage Notes (Signed)
Pt arrived via [facility] from [facility] where staff reports pt has had left arm pain. Pt denies any injury reports "it feels like I slept on it wrong". Pt also reports urinary frequency.

## 2018-06-27 NOTE — ED Notes (Signed)
[facility] ON THE WAY

## 2018-06-27 NOTE — ED Notes (Signed)
Pt being transported to CT

## 2018-06-27 NOTE — ED Provider Notes (Signed)
[facility] EMERGENCY DEPARTMENT Provider Note   CSN: [id number] Arrival date & time: 06/27/18  1751     History   Chief Complaint Chief Complaint  Patient presents with  . Arm Pain  . Urinary Frequency    HPI Wendy Erickson is a 77 y.o. female.  She is a rather poor historian.  She was sent in from her facility for left shoulder pain.  She says is been there since this morning.  She denies any trauma but she thinks she might of slept on it wrong.  It is not associate with chest pain or shortness of breath.  She has noticed some urinary frequency.  Staff sent a note in with her concerned that she may have had a stroke because of the arm pain.  Patient denies any headache or blurry vision.  No numbness or weakness.  The history is provided by the patient and the nursing home.  Arm Pain  This is a new problem. The current episode started 6 to 12 hours ago. The problem occurs constantly. The problem has not changed since onset.Pertinent negatives include no chest pain, no abdominal pain, no headaches and no shortness of breath. The symptoms are aggravated by bending and twisting. Nothing relieves the symptoms. She has tried acetaminophen for the symptoms. The treatment provided no relief.  Urinary Frequency  This is a recurrent problem. Episode onset: unknown. Pertinent negatives include no chest pain, no abdominal pain, no headaches and no shortness of breath. Nothing aggravates the symptoms. Nothing relieves the symptoms. She has tried nothing for the symptoms. The treatment provided no relief.    Past Medical History:  Diagnosis Date  . Abnormality of gait 12/05/2014  . Arthritis    "knees" (05/14/2014)  . 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 Chronic kidney disease    left renal neoplasm  . Depression   . Dysphagia, pharyngoesophageal phase 12/05/2014  . Foot fracture, right   . Gait disorder   . Heart murmur   . High cholesterol   . History of gout    . Hypertension   . 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 Osteopenia   . Peripheral neuropathy   . 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 Shingles    Right V1 distribution  . Symptomatic anemia 06/21/2018  . Trigeminal neuralgia of right side of face 06/09/2015   V1 distribution    Patient Active Problem List   Diagnosis Date Noted  . Symptomatic anemia 06/21/2018  . Microcytic hypochromic anemia 06/21/2018  . Fall   . Subarachnoid hemorrhage ([place]) 05/25/2016  . Subdural hematoma ([place]) 05/25/2016  . SAH (subarachnoid hemorrhage) ([place]) 05/25/2016  . Laceration of face-right supraorbital 05/25/2016  . Trigeminal neuralgia of right side of face 06/09/2015  . Abnormality of gait 12/05/2014  . Dysphagia, pharyngoesophageal phase 12/05/2014  . Cholelithiasis 05/14/2014  . Abdominal pain, epigastric 05/14/2014  . Systolic murmur 57/84/6962  . Chest pain 05/11/2014  . Atypical chest pain 05/11/2014  . 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 Hydronephrosis of right kidney 04/13/2013  . Absent kidney, acquired 04/13/2013  . Pyelonephritis 04/11/2013  . AKI (acute kidney injury) ([place]) 04/11/2013  . HTN (hypertension) 04/11/2013    Past Surgical History:  Procedure Laterality Date  . BACK SURGERY    . CATARACT EXTRACTION W/ INTRAOCULAR LENS IMPLANT Left   . DILATION AND CURETTAGE OF UTERUS    . KNEE ARTHROSCOPY Bilateral   .  LAPAROSCOPIC NEPHRECTOMY  10/06/2011   Procedure: LAPAROSCOPIC NEPHRECTOMY;  Surgeon: Dutch Gray, MD;  Location: [facility];  Service: Urology;  Laterality: Left;  LEFT LAPAROSCOPIC RADICAL NEPHRECTOMY   . LUMBAR DISC SURGERY     "herniated disc"  . TONSILLECTOMY    . VAGINAL HYSTERECTOMY       OB History   None      Home Medications    Prior to Admission medications   Medication Sig Start Date End Date Taking? Authorizing Provider  acetaminophen (TYLENOL) 325 MG tablet Take 650 mg by mouth every 4  (four) hours as needed for moderate pain or fever (max 10 in 24 hours).    [provider]  atorvastatin (LIPITOR) 40 MG tablet Take 40 mg by mouth at bedtime.     [provider]  Calcium Carbonate-Vitamin D (CALCARB 600/D) 600-400 MG-UNIT per tablet Take 1 tablet by mouth daily.     [provider]  carbamazepine (TEGRETOL) 200 MG tablet Take 1 tablet (200 mg total) by mouth 2 (two) times daily. 06/05/14   Kathrynn Ducking, MD  cetirizine (ZYRTEC) 10 MG tablet Take 10 mg by mouth at bedtime.     [provider]  ENSURE (ENSURE) Take 237 mLs by mouth 3 (three) times daily between meals.    [provider]  ferrous sulfate 325 (65 FE) MG EC tablet Take 1 tablet (325 mg total) by mouth 2 (two) times daily. 06/22/18 06/22/19  Elgergawy, Silver Huguenin, MD  gabapentin (NEURONTIN) 300 MG capsule Take 300 mg by mouth 2 (two) times daily.     [provider]  hypromellose (GENTEAL SEVERE) 0.3 % GEL ophthalmic ointment Place 1 application into both eyes at bedtime.    [provider]  magnesium hydroxide (MILK OF MAGNESIA) 400 MG/5ML suspension Take 30 mLs by mouth daily as needed for mild constipation.    [provider]  Melatonin 1 MG TABS Take 1 mg by mouth at bedtime.     [provider]  metoprolol tartrate (LOPRESSOR) 25 MG tablet Take 0.5 tablets (12.5 mg total) by mouth 2 (two) times daily. 05/27/16   Bonnielee Haff, MD  ondansetron (ZOFRAN) 4 MG tablet Take 4 mg by mouth 4 (four) times daily as needed for nausea or vomiting.    [provider]  pantoprazole (PROTONIX) 40 MG tablet Take 40 mg by mouth daily.    [provider]  polycarbophil (FIBERCON) 625 MG tablet Take 625 mg by mouth 2 (two) times daily.     [provider]  polyethylene glycol (MIRALAX) packet Take 17 g by mouth daily as needed for moderate constipation. 05/14/14   Domenic Polite, MD  senna (SENOKOT) 8.6 MG tablet Take 1  tablet by mouth 2 (two) times daily.     [provider]  sertraline (ZOLOFT) 100 MG tablet Take 100 mg by mouth daily. Take with 25 mg to equal 125 mg    [provider]  sertraline (ZOLOFT) 25 MG tablet Take 25 mg by mouth daily. Take with 100 mg to equal 125 mg    [provider]    Family History Family History  Problem Relation Age of Onset  . Ataxia Father   . Dementia Mother     Social History Social History   Tobacco Use  . Smoking status: Former Smoker    Packs/day: 1.00    Years: 40.00    Pack years: 40.00    Types: Cigarettes    Last  attempt to quit: 08/08/2005    Years since quitting: 12.8  . Smokeless tobacco: Never Used  Substance Use Topics  . Alcohol use: No    Comment: wine on Friday  . Drug use: No     Allergies   Pineapple   Review of Systems Review of Systems  Constitutional: Negative for fever.  HENT: Negative for sore throat.   Eyes: Negative for visual disturbance.  Respiratory: Negative for shortness of breath.   Cardiovascular: Negative for chest pain.  Gastrointestinal: Negative for abdominal pain.  Genitourinary: Positive for frequency. Negative for dysuria.  Musculoskeletal: Negative for neck pain.  Skin: Negative for rash.  Neurological: Positive for dizziness. Negative for headaches.     Physical Exam Updated Vital Signs BP 138/66 (BP Location: Right Arm)   Pulse 74   Temp 99.7 F (37.6 C) (Oral)   Resp 15   Ht 5\' 5"  (1.651 m)   Wt 80 kg   SpO2 93%   BMI 29.35 kg/m   Physical Exam  Constitutional: She appears well-developed and well-nourished. No distress.  HENT:  Head: Normocephalic and atraumatic.  Eyes: Conjunctivae are normal.  Neck: Neck supple.  Cardiovascular: Normal rate, regular rhythm and normal heart sounds.  No murmur heard. Pulmonary/Chest: Effort normal and breath sounds normal. No respiratory distress.  Abdominal: Soft. There is no tenderness.  Musculoskeletal: She exhibits  tenderness. She exhibits no edema or deformity.  sHe has some tenderness of the anterior left shoulder.  Normal internal and external rotation.  Normal landmarks.  No overlying erythema or warmth.  Palpation of that area reproduces the patient's pain.  Neurological: She is alert. No sensory deficit.  She has known history of cerebellar ataxia.  She is able to move her extremities 4 x 4 but seems limited in her left shoulder due to pain.  Sensation is grossly intact.  Skin: Skin is warm and dry.  Psychiatric: She has a normal mood and affect.  Nursing note and vitals reviewed.    ED Treatments / Results  Labs (all labs ordered are listed, but only abnormal results are displayed) Labs Reviewed  BASIC METABOLIC PANEL - Abnormal; Notable for the following components:      Result Value   Glucose, Bld 101 (*)    Calcium 8.8 (*)    All other components within normal limits  CBC WITH DIFFERENTIAL/PLATELET - Abnormal; Notable for the following components:   WBC 15.3 (*)    RBC 3.84 (*)    Hemoglobin 8.9 (*)    HCT 32.0 (*)    MCH 23.2 (*)    MCHC 27.8 (*)    Neutro Abs 12.9 (*)    All other components within normal limits  URINALYSIS, ROUTINE W REFLEX MICROSCOPIC - Abnormal; Notable for the following components:   APPearance HAZY (*)    Ketones, ur 5 (*)    Protein, ur 30 (*)    Bacteria, UA MANY (*)    All other components within normal limits  URINE CULTURE  TROPONIN I    EKG None  Radiology Ct Head Wo Contrast  Result Date: 06/27/2018 CLINICAL DATA:  Stroke suspected. LEFT arm weakness. EXAM: CT HEAD WITHOUT CONTRAST TECHNIQUE: Contiguous axial images were obtained from the base of the skull through the vertex without intravenous contrast. COMPARISON:  06/29/2016. FINDINGS: Brain: No acute stroke, acute hemorrhage, mass lesion, hydrocephalus, or extra-axial fluid. BILATERAL cerebral atrophy, prominence of the extracerebral CSF spaces over the frontal lobes. Advanced cerebellar  atrophy and brainstem atrophy,  consistent with the diagnosis of spinocerebellar ataxia. Hypoattenuation of white matter, likely small vessel disease. Vascular: Calcification of the cavernous internal carotid arteries consistent with cerebrovascular atherosclerotic disease. No signs of intracranial large vessel occlusion. Skull: No fracture or significant focal lesion. Sinuses/Orbits: No sinus disease. BILATERAL cataract extraction. Other: None. IMPRESSION: Atrophy and small vessel disease. No acute intracranial findings. Advanced cerebellar and brainstem atrophy consistent with the diagnosis of spinocerebellar ataxia. Electronically Signed   By: Staci Righter M.D.   On: 06/27/2018 19:43   Dg Shoulder Left  Result Date: 06/27/2018 CLINICAL DATA:  Left shoulder pain EXAM: LEFT SHOULDER - 2+ VIEW COMPARISON:  None. FINDINGS: Curvilinear chondral calcification outlining the humeral head is noted compatible with chondrocalcinosis. Mild degenerative joint space narrowing of the glenohumeral joint with inferior glenoid spurring is noted. The acromioclavicular joint is maintained. No acute fracture or joint dislocations. Soft tissues are unremarkable. The adjacent ribs and lung are nonacute. IMPRESSION: 1. 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 2. Mild glenohumeral joint osteoarthritis. 3. No acute osseous abnormality. Electronically Signed   By: Ashley Royalty M.D.   On: 06/27/2018 21:25    Procedures Procedures (including critical care time)  Medications Ordered in ED Medications - No data to display   Initial Impression / Assessment and Plan / ED Course  I have reviewed the triage vital signs and the nursing notes.  Pertinent labs & imaging results that were available during my care of the patient were reviewed by me and considered in my medical decision making (see chart for details).  Clinical Course as of Jun 27 2324  Wed  Jun 27, 2018  2128 Patient here with an elevated white count and urine with bacteria but no gross infection.  Will send for culture.  CT head shows no signs of acute stroke.  Shoulder shows some chondrocalcinosis consistent with pseudogout.   [MB]    Clinical Course User Index [MB] Hayden Rasmussen, MD     Final Clinical Impressions(s) / ED Diagnoses   Final diagnoses:  Acute pain of left shoulder  Pseudogout of left shoulder  Urinary frequency    ED Discharge Orders    None       Hayden Rasmussen, MD 06/27/18 2325

## 2018-06-27 NOTE — ED Notes (Signed)
ED Provider at bedside. 

## 2018-06-27 NOTE — Discharge Instructions (Signed)
Emergency department for increased pain in your left shoulder.  Your x-ray showed a lot of calcification that is likely related to pseudogout.  There is no evidence of a stroke and no obvious urine infection although urine culture was sent.  We are returning you to the facility and they should provide you with some medications for pain.  It will be important for you to follow-up with your primary care doctor for further evaluation of this.

## 2018-06-28 DIAGNOSIS — Z7401 Bed confinement status: Secondary | ICD-10-CM | POA: Diagnosis not present

## 2018-06-28 DIAGNOSIS — L959 Vasculitis limited to the skin, unspecified: Secondary | ICD-10-CM | POA: Diagnosis not present

## 2018-06-28 DIAGNOSIS — M2550 Pain in unspecified joint: Secondary | ICD-10-CM | POA: Diagnosis not present

## 2018-06-28 DIAGNOSIS — I10 Essential (primary) hypertension: Secondary | ICD-10-CM | POA: Diagnosis not present

## 2018-06-29 DIAGNOSIS — D649 Anemia, unspecified: Secondary | ICD-10-CM | POA: Diagnosis not present

## 2018-06-29 DIAGNOSIS — I10 Essential (primary) hypertension: Secondary | ICD-10-CM | POA: Diagnosis not present

## 2018-06-29 DIAGNOSIS — G3281 Cerebellar ataxia in diseases classified elsewhere: Secondary | ICD-10-CM | POA: Diagnosis not present

## 2018-06-29 DIAGNOSIS — M1000 Idiopathic gout, unspecified site: Secondary | ICD-10-CM | POA: Diagnosis not present

## 2018-06-29 DIAGNOSIS — Z79899 Other long term (current) drug therapy: Secondary | ICD-10-CM | POA: Diagnosis not present

## 2018-06-29 DIAGNOSIS — D509 Iron deficiency anemia, unspecified: Secondary | ICD-10-CM | POA: Diagnosis not present

## 2018-06-30 DIAGNOSIS — M1000 Idiopathic gout, unspecified site: Secondary | ICD-10-CM | POA: Diagnosis not present

## 2018-07-02 DIAGNOSIS — I10 Essential (primary) hypertension: Secondary | ICD-10-CM | POA: Diagnosis not present

## 2018-07-02 DIAGNOSIS — G3281 Cerebellar ataxia in diseases classified elsewhere: Secondary | ICD-10-CM | POA: Diagnosis not present

## 2018-07-02 DIAGNOSIS — D649 Anemia, unspecified: Secondary | ICD-10-CM | POA: Diagnosis not present

## 2018-07-02 DIAGNOSIS — M1000 Idiopathic gout, unspecified site: Secondary | ICD-10-CM | POA: Diagnosis not present

## 2018-07-03 ENCOUNTER — Telehealth: Payer: Self-pay | Admitting: Neurology

## 2018-07-03 DIAGNOSIS — R278 Other lack of coordination: Secondary | ICD-10-CM | POA: Diagnosis not present

## 2018-07-03 DIAGNOSIS — M6281 Muscle weakness (generalized): Secondary | ICD-10-CM | POA: Diagnosis not present

## 2018-07-03 DIAGNOSIS — G3281 Cerebellar ataxia in diseases classified elsewhere: Secondary | ICD-10-CM | POA: Diagnosis not present

## 2018-07-03 LAB — URINE CULTURE: SPECIAL REQUESTS: NORMAL

## 2018-07-03 MED ORDER — CARBAMAZEPINE 200 MG PO TABS: ORAL_TABLET | ORAL | 5 refills | Status: DC

## 2018-07-03 MED ORDER — GABAPENTIN 300 MG PO CAPS: 300.0000 mg | ORAL_CAPSULE | Freq: Three times a day (TID) | ORAL | 11 refills | Status: DC

## 2018-07-03 NOTE — Telephone Encounter (Signed)
I called [facility], talk with the nurse.  I will fax orders over for the patient.  The patient will be tapered off of carbamazepine going to 100 mg twice daily for 1 week and then stop the medication.  She is on the drug for neuralgia pain.  We will go up on the gabapentin taking 300 mg 3 times daily.  These orders were faxed to [phone].  The telephone number is [phone].

## 2018-07-03 NOTE — Telephone Encounter (Signed)
I spoke with Wendy Erickson.  Pt's hgb on 06/20/18 was 3.7.  She was sent to the hospital and received 2 units PRBC's. Hgb on 06/25/18 was 9.3, and on 07/02/18 was 9/7.  No obvious source of anemia. No n/v/d, no blood in stool.  Will pass update along to Dr. Jannifer Franklin and see how he would like to proceed/fim

## 2018-07-03 NOTE — Telephone Encounter (Signed)
Wendy Erickson with [facility] calling stating patient has a low HGB and thinks carbamazepine (TEGRETOL) 200 MG tablet may be causing her to be anemic.She would like a call back from Dr. Jannifer Franklin or his nurse.

## 2018-07-03 NOTE — Addendum Note (Signed)
Addended by: Kathrynn Ducking on: 07/03/2018 12:27 PM   Modules accepted: Orders

## 2018-07-04 ENCOUNTER — Telehealth: Payer: Self-pay | Admitting: Emergency Medicine

## 2018-07-04 DIAGNOSIS — R278 Other lack of coordination: Secondary | ICD-10-CM | POA: Diagnosis not present

## 2018-07-04 DIAGNOSIS — M6281 Muscle weakness (generalized): Secondary | ICD-10-CM | POA: Diagnosis not present

## 2018-07-04 DIAGNOSIS — G3281 Cerebellar ataxia in diseases classified elsewhere: Secondary | ICD-10-CM | POA: Diagnosis not present

## 2018-07-04 NOTE — Progress Notes (Signed)
ED Antimicrobial Stewardship Positive Culture Follow Up   Wendy Erickson is an 77 y.o. female who presented to [facility] on 06/27/2018 with a chief complaint of  Chief Complaint  Patient presents with  . Arm Pain  . Urinary Frequency    Recent Results (from the past 720 hour(s))  MRSA PCR Screening     Status: None   Collection Time: 06/21/18  6:26 AM  Result Value Ref Range Status   MRSA by PCR NEGATIVE NEGATIVE Final    Comment:        The GeneXpert MRSA Assay (FDA approved for NASAL specimens only), is one component of a comprehensive MRSA colonization surveillance program. It is not intended to diagnose MRSA infection nor to guide or monitor treatment for MRSA infections. Performed at [facility], [place] [address] [place], [place] [address]   Urine culture     Status: Abnormal   Collection Time: 06/27/18  6:46 PM  Result Value Ref Range Status   Specimen Description URINE, CATHETERIZED  Final   Special Requests Normal  Final   Culture (A)  Final    >=100,000 COLONIES/mL AEROCOCCUS URINAE Standardized susceptibility testing for this organism is not available. Performed at [facility], [place] [address] [place], [place] [address]    Report Status 07/03/2018 FINAL  Final    [x]  Patient discharged originally without antimicrobial agent and treatment is now indicated  New antibiotic prescription: Amoxil 500mg  every 8 hours x 5 days  ED Provider: Nuala Alpha, PA-C   [name] 07/04/2018, 3:24 PM Clinical Pharmacist Monday - Friday phone -  [phone] Saturday - Sunday phone - [phone]

## 2018-07-04 NOTE — Telephone Encounter (Signed)
Post ED Visit - Positive Culture Follow-up: Successful Patient Follow-Up  Culture assessed and recommendations reviewed by:  []  Elenor Quinones, Pharm.D. []  Heide Guile, Pharm.D., BCPS AQ-ID []  Parks Neptune, Pharm.D., BCPS []  Alycia Rossetti, Pharm.D., BCPS []  [place], Pharm.D., BCPS, AAHIVP []  Legrand Como, Pharm.D., BCPS, AAHIVP []  Salome Arnt, PharmD, BCPS []  Johnnette Gourd, PharmD, BCPS []  Hughes Better, PharmD, BCPS []  Leeroy Cha, PharmD  [name] PharmD  Positive urine culture  [x]  Patient discharged without antimicrobial prescription and treatment is now indicated []  Organism is resistant to prescribed ED discharge antimicrobial []  Patient with positive blood cultures  Changes discussed with ED provider: Nuala Alpha PA New antibiotic prescription start Amoxil 500mg  po q 8 hours x 5 days Faxed to [facility] @ [phone]    Hazle Nordmann 07/04/2018, 1:06 PM

## 2018-07-05 DIAGNOSIS — R278 Other lack of coordination: Secondary | ICD-10-CM | POA: Diagnosis not present

## 2018-07-05 DIAGNOSIS — M6281 Muscle weakness (generalized): Secondary | ICD-10-CM | POA: Diagnosis not present

## 2018-07-05 DIAGNOSIS — G3281 Cerebellar ataxia in diseases classified elsewhere: Secondary | ICD-10-CM | POA: Diagnosis not present

## 2018-07-06 DIAGNOSIS — M6281 Muscle weakness (generalized): Secondary | ICD-10-CM | POA: Diagnosis not present

## 2018-07-06 DIAGNOSIS — G3281 Cerebellar ataxia in diseases classified elsewhere: Secondary | ICD-10-CM | POA: Diagnosis not present

## 2018-07-06 DIAGNOSIS — R278 Other lack of coordination: Secondary | ICD-10-CM | POA: Diagnosis not present

## 2018-07-06 DIAGNOSIS — D649 Anemia, unspecified: Secondary | ICD-10-CM | POA: Diagnosis not present

## 2018-07-06 DIAGNOSIS — N2889 Other specified disorders of kidney and ureter: Secondary | ICD-10-CM | POA: Diagnosis not present

## 2018-07-06 DIAGNOSIS — M1000 Idiopathic gout, unspecified site: Secondary | ICD-10-CM | POA: Diagnosis not present

## 2018-07-06 DIAGNOSIS — I10 Essential (primary) hypertension: Secondary | ICD-10-CM | POA: Diagnosis not present

## 2018-07-09 DIAGNOSIS — M6281 Muscle weakness (generalized): Secondary | ICD-10-CM | POA: Diagnosis not present

## 2018-07-09 DIAGNOSIS — R278 Other lack of coordination: Secondary | ICD-10-CM | POA: Diagnosis not present

## 2018-07-09 DIAGNOSIS — G3281 Cerebellar ataxia in diseases classified elsewhere: Secondary | ICD-10-CM | POA: Diagnosis not present

## 2018-07-10 DIAGNOSIS — R278 Other lack of coordination: Secondary | ICD-10-CM | POA: Diagnosis not present

## 2018-07-10 DIAGNOSIS — M6281 Muscle weakness (generalized): Secondary | ICD-10-CM | POA: Diagnosis not present

## 2018-07-10 DIAGNOSIS — G3281 Cerebellar ataxia in diseases classified elsewhere: Secondary | ICD-10-CM | POA: Diagnosis not present

## 2018-07-11 DIAGNOSIS — R634 Abnormal weight loss: Secondary | ICD-10-CM | POA: Diagnosis not present

## 2018-07-11 DIAGNOSIS — I10 Essential (primary) hypertension: Secondary | ICD-10-CM | POA: Diagnosis not present

## 2018-07-11 DIAGNOSIS — G4701 Insomnia due to medical condition: Secondary | ICD-10-CM | POA: Diagnosis not present

## 2018-07-11 DIAGNOSIS — M6281 Muscle weakness (generalized): Secondary | ICD-10-CM | POA: Diagnosis not present

## 2018-07-11 DIAGNOSIS — F331 Major depressive disorder, recurrent, moderate: Secondary | ICD-10-CM | POA: Diagnosis not present

## 2018-07-11 DIAGNOSIS — R278 Other lack of coordination: Secondary | ICD-10-CM | POA: Diagnosis not present

## 2018-07-11 DIAGNOSIS — H00014 Hordeolum externum left upper eyelid: Secondary | ICD-10-CM | POA: Diagnosis not present

## 2018-07-11 DIAGNOSIS — G3281 Cerebellar ataxia in diseases classified elsewhere: Secondary | ICD-10-CM | POA: Diagnosis not present

## 2018-07-12 DIAGNOSIS — F419 Anxiety disorder, unspecified: Secondary | ICD-10-CM | POA: Diagnosis not present

## 2018-07-12 DIAGNOSIS — G3281 Cerebellar ataxia in diseases classified elsewhere: Secondary | ICD-10-CM | POA: Diagnosis not present

## 2018-07-12 DIAGNOSIS — F331 Major depressive disorder, recurrent, moderate: Secondary | ICD-10-CM | POA: Diagnosis not present

## 2018-07-12 DIAGNOSIS — R278 Other lack of coordination: Secondary | ICD-10-CM | POA: Diagnosis not present

## 2018-07-12 DIAGNOSIS — M6281 Muscle weakness (generalized): Secondary | ICD-10-CM | POA: Diagnosis not present

## 2018-07-13 DIAGNOSIS — M6281 Muscle weakness (generalized): Secondary | ICD-10-CM | POA: Diagnosis not present

## 2018-07-13 DIAGNOSIS — R278 Other lack of coordination: Secondary | ICD-10-CM | POA: Diagnosis not present

## 2018-07-13 DIAGNOSIS — G3281 Cerebellar ataxia in diseases classified elsewhere: Secondary | ICD-10-CM | POA: Diagnosis not present

## 2018-07-16 DIAGNOSIS — G3281 Cerebellar ataxia in diseases classified elsewhere: Secondary | ICD-10-CM | POA: Diagnosis not present

## 2018-07-16 DIAGNOSIS — R278 Other lack of coordination: Secondary | ICD-10-CM | POA: Diagnosis not present

## 2018-07-16 DIAGNOSIS — M6281 Muscle weakness (generalized): Secondary | ICD-10-CM | POA: Diagnosis not present

## 2018-07-17 DIAGNOSIS — M6281 Muscle weakness (generalized): Secondary | ICD-10-CM | POA: Diagnosis not present

## 2018-07-17 DIAGNOSIS — G3281 Cerebellar ataxia in diseases classified elsewhere: Secondary | ICD-10-CM | POA: Diagnosis not present

## 2018-07-17 DIAGNOSIS — R278 Other lack of coordination: Secondary | ICD-10-CM | POA: Diagnosis not present

## 2018-07-18 DIAGNOSIS — G3281 Cerebellar ataxia in diseases classified elsewhere: Secondary | ICD-10-CM | POA: Diagnosis not present

## 2018-07-18 DIAGNOSIS — M6281 Muscle weakness (generalized): Secondary | ICD-10-CM | POA: Diagnosis not present

## 2018-07-18 DIAGNOSIS — R278 Other lack of coordination: Secondary | ICD-10-CM | POA: Diagnosis not present

## 2018-07-19 DIAGNOSIS — M6281 Muscle weakness (generalized): Secondary | ICD-10-CM | POA: Diagnosis not present

## 2018-07-19 DIAGNOSIS — R278 Other lack of coordination: Secondary | ICD-10-CM | POA: Diagnosis not present

## 2018-07-19 DIAGNOSIS — G3281 Cerebellar ataxia in diseases classified elsewhere: Secondary | ICD-10-CM | POA: Diagnosis not present

## 2018-07-20 DIAGNOSIS — M6281 Muscle weakness (generalized): Secondary | ICD-10-CM | POA: Diagnosis not present

## 2018-07-20 DIAGNOSIS — G3281 Cerebellar ataxia in diseases classified elsewhere: Secondary | ICD-10-CM | POA: Diagnosis not present

## 2018-07-20 DIAGNOSIS — R278 Other lack of coordination: Secondary | ICD-10-CM | POA: Diagnosis not present

## 2018-07-23 DIAGNOSIS — G3281 Cerebellar ataxia in diseases classified elsewhere: Secondary | ICD-10-CM | POA: Diagnosis not present

## 2018-07-23 DIAGNOSIS — R278 Other lack of coordination: Secondary | ICD-10-CM | POA: Diagnosis not present

## 2018-07-23 DIAGNOSIS — M6281 Muscle weakness (generalized): Secondary | ICD-10-CM | POA: Diagnosis not present

## 2018-07-24 DIAGNOSIS — G3281 Cerebellar ataxia in diseases classified elsewhere: Secondary | ICD-10-CM | POA: Diagnosis not present

## 2018-07-24 DIAGNOSIS — R278 Other lack of coordination: Secondary | ICD-10-CM | POA: Diagnosis not present

## 2018-07-24 DIAGNOSIS — M6281 Muscle weakness (generalized): Secondary | ICD-10-CM | POA: Diagnosis not present

## 2018-07-25 DIAGNOSIS — M6281 Muscle weakness (generalized): Secondary | ICD-10-CM | POA: Diagnosis not present

## 2018-07-25 DIAGNOSIS — G3281 Cerebellar ataxia in diseases classified elsewhere: Secondary | ICD-10-CM | POA: Diagnosis not present

## 2018-07-25 DIAGNOSIS — R278 Other lack of coordination: Secondary | ICD-10-CM | POA: Diagnosis not present

## 2018-07-26 DIAGNOSIS — F331 Major depressive disorder, recurrent, moderate: Secondary | ICD-10-CM | POA: Diagnosis not present

## 2018-07-26 DIAGNOSIS — G3281 Cerebellar ataxia in diseases classified elsewhere: Secondary | ICD-10-CM | POA: Diagnosis not present

## 2018-07-26 DIAGNOSIS — M6281 Muscle weakness (generalized): Secondary | ICD-10-CM | POA: Diagnosis not present

## 2018-07-26 DIAGNOSIS — F419 Anxiety disorder, unspecified: Secondary | ICD-10-CM | POA: Diagnosis not present

## 2018-07-26 DIAGNOSIS — R278 Other lack of coordination: Secondary | ICD-10-CM | POA: Diagnosis not present

## 2018-07-27 DIAGNOSIS — G3281 Cerebellar ataxia in diseases classified elsewhere: Secondary | ICD-10-CM | POA: Diagnosis not present

## 2018-07-27 DIAGNOSIS — R278 Other lack of coordination: Secondary | ICD-10-CM | POA: Diagnosis not present

## 2018-07-27 DIAGNOSIS — M6281 Muscle weakness (generalized): Secondary | ICD-10-CM | POA: Diagnosis not present

## 2018-07-30 DIAGNOSIS — R278 Other lack of coordination: Secondary | ICD-10-CM | POA: Diagnosis not present

## 2018-07-30 DIAGNOSIS — G3281 Cerebellar ataxia in diseases classified elsewhere: Secondary | ICD-10-CM | POA: Diagnosis not present

## 2018-07-30 DIAGNOSIS — M6281 Muscle weakness (generalized): Secondary | ICD-10-CM | POA: Diagnosis not present

## 2018-07-31 DIAGNOSIS — G3281 Cerebellar ataxia in diseases classified elsewhere: Secondary | ICD-10-CM | POA: Diagnosis not present

## 2018-07-31 DIAGNOSIS — M6281 Muscle weakness (generalized): Secondary | ICD-10-CM | POA: Diagnosis not present

## 2018-07-31 DIAGNOSIS — R278 Other lack of coordination: Secondary | ICD-10-CM | POA: Diagnosis not present

## 2018-08-02 DIAGNOSIS — R278 Other lack of coordination: Secondary | ICD-10-CM | POA: Diagnosis not present

## 2018-08-02 DIAGNOSIS — G3281 Cerebellar ataxia in diseases classified elsewhere: Secondary | ICD-10-CM | POA: Diagnosis not present

## 2018-08-02 DIAGNOSIS — M6281 Muscle weakness (generalized): Secondary | ICD-10-CM | POA: Diagnosis not present

## 2018-08-03 DIAGNOSIS — G3281 Cerebellar ataxia in diseases classified elsewhere: Secondary | ICD-10-CM | POA: Diagnosis not present

## 2018-08-03 DIAGNOSIS — M6281 Muscle weakness (generalized): Secondary | ICD-10-CM | POA: Diagnosis not present

## 2018-08-03 DIAGNOSIS — R278 Other lack of coordination: Secondary | ICD-10-CM | POA: Diagnosis not present

## 2018-08-03 DIAGNOSIS — D509 Iron deficiency anemia, unspecified: Secondary | ICD-10-CM | POA: Diagnosis not present

## 2018-08-03 DIAGNOSIS — D649 Anemia, unspecified: Secondary | ICD-10-CM | POA: Diagnosis not present

## 2018-08-05 DIAGNOSIS — G3281 Cerebellar ataxia in diseases classified elsewhere: Secondary | ICD-10-CM | POA: Diagnosis not present

## 2018-08-05 DIAGNOSIS — M6281 Muscle weakness (generalized): Secondary | ICD-10-CM | POA: Diagnosis not present

## 2018-08-05 DIAGNOSIS — R278 Other lack of coordination: Secondary | ICD-10-CM | POA: Diagnosis not present

## 2018-08-06 DIAGNOSIS — M6281 Muscle weakness (generalized): Secondary | ICD-10-CM | POA: Diagnosis not present

## 2018-08-06 DIAGNOSIS — G3281 Cerebellar ataxia in diseases classified elsewhere: Secondary | ICD-10-CM | POA: Diagnosis not present

## 2018-08-06 DIAGNOSIS — R278 Other lack of coordination: Secondary | ICD-10-CM | POA: Diagnosis not present

## 2018-08-07 DIAGNOSIS — G3281 Cerebellar ataxia in diseases classified elsewhere: Secondary | ICD-10-CM | POA: Diagnosis not present

## 2018-08-07 DIAGNOSIS — R278 Other lack of coordination: Secondary | ICD-10-CM | POA: Diagnosis not present

## 2018-08-07 DIAGNOSIS — M6281 Muscle weakness (generalized): Secondary | ICD-10-CM | POA: Diagnosis not present

## 2018-08-08 DIAGNOSIS — M6281 Muscle weakness (generalized): Secondary | ICD-10-CM | POA: Diagnosis not present

## 2018-08-08 DIAGNOSIS — R278 Other lack of coordination: Secondary | ICD-10-CM | POA: Diagnosis not present

## 2018-08-08 DIAGNOSIS — R634 Abnormal weight loss: Secondary | ICD-10-CM | POA: Diagnosis not present

## 2018-08-08 DIAGNOSIS — G3281 Cerebellar ataxia in diseases classified elsewhere: Secondary | ICD-10-CM | POA: Diagnosis not present

## 2018-08-09 DIAGNOSIS — M6281 Muscle weakness (generalized): Secondary | ICD-10-CM | POA: Diagnosis not present

## 2018-08-09 DIAGNOSIS — R634 Abnormal weight loss: Secondary | ICD-10-CM | POA: Diagnosis not present

## 2018-08-09 DIAGNOSIS — F331 Major depressive disorder, recurrent, moderate: Secondary | ICD-10-CM | POA: Diagnosis not present

## 2018-08-09 DIAGNOSIS — R278 Other lack of coordination: Secondary | ICD-10-CM | POA: Diagnosis not present

## 2018-08-09 DIAGNOSIS — F419 Anxiety disorder, unspecified: Secondary | ICD-10-CM | POA: Diagnosis not present

## 2018-08-09 DIAGNOSIS — G3281 Cerebellar ataxia in diseases classified elsewhere: Secondary | ICD-10-CM | POA: Diagnosis not present

## 2018-08-13 DIAGNOSIS — M6281 Muscle weakness (generalized): Secondary | ICD-10-CM | POA: Diagnosis not present

## 2018-08-13 DIAGNOSIS — M87078 Idiopathic aseptic necrosis of left toe(s): Secondary | ICD-10-CM | POA: Diagnosis not present

## 2018-08-13 DIAGNOSIS — M1000 Idiopathic gout, unspecified site: Secondary | ICD-10-CM | POA: Diagnosis not present

## 2018-08-13 DIAGNOSIS — R634 Abnormal weight loss: Secondary | ICD-10-CM | POA: Diagnosis not present

## 2018-08-15 DIAGNOSIS — F331 Major depressive disorder, recurrent, moderate: Secondary | ICD-10-CM | POA: Diagnosis not present

## 2018-08-15 DIAGNOSIS — G4701 Insomnia due to medical condition: Secondary | ICD-10-CM | POA: Diagnosis not present

## 2018-08-16 DIAGNOSIS — F419 Anxiety disorder, unspecified: Secondary | ICD-10-CM | POA: Diagnosis not present

## 2018-08-16 DIAGNOSIS — F331 Major depressive disorder, recurrent, moderate: Secondary | ICD-10-CM | POA: Diagnosis not present

## 2018-08-20 DIAGNOSIS — R296 Repeated falls: Secondary | ICD-10-CM | POA: Diagnosis not present

## 2018-08-20 DIAGNOSIS — M79671 Pain in right foot: Secondary | ICD-10-CM | POA: Diagnosis not present

## 2018-08-20 DIAGNOSIS — M25571 Pain in right ankle and joints of right foot: Secondary | ICD-10-CM | POA: Diagnosis not present

## 2018-08-21 DIAGNOSIS — R278 Other lack of coordination: Secondary | ICD-10-CM | POA: Diagnosis not present

## 2018-08-21 DIAGNOSIS — G3281 Cerebellar ataxia in diseases classified elsewhere: Secondary | ICD-10-CM | POA: Diagnosis not present

## 2018-08-21 DIAGNOSIS — M6281 Muscle weakness (generalized): Secondary | ICD-10-CM | POA: Diagnosis not present

## 2018-08-21 DIAGNOSIS — R634 Abnormal weight loss: Secondary | ICD-10-CM | POA: Diagnosis not present

## 2018-08-22 DIAGNOSIS — G3281 Cerebellar ataxia in diseases classified elsewhere: Secondary | ICD-10-CM | POA: Diagnosis not present

## 2018-08-22 DIAGNOSIS — R278 Other lack of coordination: Secondary | ICD-10-CM | POA: Diagnosis not present

## 2018-08-22 DIAGNOSIS — R634 Abnormal weight loss: Secondary | ICD-10-CM | POA: Diagnosis not present

## 2018-08-22 DIAGNOSIS — M6281 Muscle weakness (generalized): Secondary | ICD-10-CM | POA: Diagnosis not present

## 2018-08-23 DIAGNOSIS — F331 Major depressive disorder, recurrent, moderate: Secondary | ICD-10-CM | POA: Diagnosis not present

## 2018-08-23 DIAGNOSIS — R278 Other lack of coordination: Secondary | ICD-10-CM | POA: Diagnosis not present

## 2018-08-23 DIAGNOSIS — F419 Anxiety disorder, unspecified: Secondary | ICD-10-CM | POA: Diagnosis not present

## 2018-08-23 DIAGNOSIS — M6281 Muscle weakness (generalized): Secondary | ICD-10-CM | POA: Diagnosis not present

## 2018-08-23 DIAGNOSIS — G3281 Cerebellar ataxia in diseases classified elsewhere: Secondary | ICD-10-CM | POA: Diagnosis not present

## 2018-08-23 DIAGNOSIS — R634 Abnormal weight loss: Secondary | ICD-10-CM | POA: Diagnosis not present

## 2018-08-24 DIAGNOSIS — G894 Chronic pain syndrome: Secondary | ICD-10-CM | POA: Diagnosis not present

## 2018-08-24 DIAGNOSIS — G609 Hereditary and idiopathic neuropathy, unspecified: Secondary | ICD-10-CM | POA: Diagnosis not present

## 2018-08-25 DIAGNOSIS — M6281 Muscle weakness (generalized): Secondary | ICD-10-CM | POA: Diagnosis not present

## 2018-08-25 DIAGNOSIS — R634 Abnormal weight loss: Secondary | ICD-10-CM | POA: Diagnosis not present

## 2018-08-25 DIAGNOSIS — R278 Other lack of coordination: Secondary | ICD-10-CM | POA: Diagnosis not present

## 2018-08-25 DIAGNOSIS — G3281 Cerebellar ataxia in diseases classified elsewhere: Secondary | ICD-10-CM | POA: Diagnosis not present

## 2018-08-28 ENCOUNTER — Ambulatory Visit: Payer: Medicare Other | Admitting: Neurology

## 2018-08-28 DIAGNOSIS — G3281 Cerebellar ataxia in diseases classified elsewhere: Secondary | ICD-10-CM | POA: Diagnosis not present

## 2018-08-28 DIAGNOSIS — R278 Other lack of coordination: Secondary | ICD-10-CM | POA: Diagnosis not present

## 2018-08-28 DIAGNOSIS — M6281 Muscle weakness (generalized): Secondary | ICD-10-CM | POA: Diagnosis not present

## 2018-08-28 DIAGNOSIS — R634 Abnormal weight loss: Secondary | ICD-10-CM | POA: Diagnosis not present

## 2018-08-30 DIAGNOSIS — F419 Anxiety disorder, unspecified: Secondary | ICD-10-CM | POA: Diagnosis not present

## 2018-08-30 DIAGNOSIS — F331 Major depressive disorder, recurrent, moderate: Secondary | ICD-10-CM | POA: Diagnosis not present

## 2018-08-31 DIAGNOSIS — G3281 Cerebellar ataxia in diseases classified elsewhere: Secondary | ICD-10-CM | POA: Diagnosis not present

## 2018-08-31 DIAGNOSIS — R278 Other lack of coordination: Secondary | ICD-10-CM | POA: Diagnosis not present

## 2018-08-31 DIAGNOSIS — R634 Abnormal weight loss: Secondary | ICD-10-CM | POA: Diagnosis not present

## 2018-08-31 DIAGNOSIS — M6281 Muscle weakness (generalized): Secondary | ICD-10-CM | POA: Diagnosis not present

## 2018-09-03 DIAGNOSIS — R634 Abnormal weight loss: Secondary | ICD-10-CM | POA: Diagnosis not present

## 2018-09-03 DIAGNOSIS — M6281 Muscle weakness (generalized): Secondary | ICD-10-CM | POA: Diagnosis not present

## 2018-09-03 DIAGNOSIS — G3281 Cerebellar ataxia in diseases classified elsewhere: Secondary | ICD-10-CM | POA: Diagnosis not present

## 2018-09-03 DIAGNOSIS — R278 Other lack of coordination: Secondary | ICD-10-CM | POA: Diagnosis not present

## 2018-09-04 DIAGNOSIS — R54 Age-related physical debility: Secondary | ICD-10-CM | POA: Diagnosis not present

## 2018-09-04 DIAGNOSIS — D508 Other iron deficiency anemias: Secondary | ICD-10-CM | POA: Diagnosis not present

## 2018-09-04 DIAGNOSIS — G9009 Other idiopathic peripheral autonomic neuropathy: Secondary | ICD-10-CM | POA: Diagnosis not present

## 2018-09-04 DIAGNOSIS — Z85528 Personal history of other malignant neoplasm of kidney: Secondary | ICD-10-CM | POA: Diagnosis not present

## 2018-09-05 DIAGNOSIS — D649 Anemia, unspecified: Secondary | ICD-10-CM | POA: Diagnosis not present

## 2018-09-06 DIAGNOSIS — R278 Other lack of coordination: Secondary | ICD-10-CM | POA: Diagnosis not present

## 2018-09-06 DIAGNOSIS — G3281 Cerebellar ataxia in diseases classified elsewhere: Secondary | ICD-10-CM | POA: Diagnosis not present

## 2018-09-06 DIAGNOSIS — F419 Anxiety disorder, unspecified: Secondary | ICD-10-CM | POA: Diagnosis not present

## 2018-09-06 DIAGNOSIS — F331 Major depressive disorder, recurrent, moderate: Secondary | ICD-10-CM | POA: Diagnosis not present

## 2018-09-06 DIAGNOSIS — M6281 Muscle weakness (generalized): Secondary | ICD-10-CM | POA: Diagnosis not present

## 2018-09-06 DIAGNOSIS — R634 Abnormal weight loss: Secondary | ICD-10-CM | POA: Diagnosis not present

## 2018-09-10 DIAGNOSIS — R278 Other lack of coordination: Secondary | ICD-10-CM | POA: Diagnosis not present

## 2018-09-10 DIAGNOSIS — G3281 Cerebellar ataxia in diseases classified elsewhere: Secondary | ICD-10-CM | POA: Diagnosis not present

## 2018-09-10 DIAGNOSIS — R634 Abnormal weight loss: Secondary | ICD-10-CM | POA: Diagnosis not present

## 2018-09-10 DIAGNOSIS — M6281 Muscle weakness (generalized): Secondary | ICD-10-CM | POA: Diagnosis not present

## 2018-09-12 DIAGNOSIS — R634 Abnormal weight loss: Secondary | ICD-10-CM | POA: Diagnosis not present

## 2018-09-12 DIAGNOSIS — M6281 Muscle weakness (generalized): Secondary | ICD-10-CM | POA: Diagnosis not present

## 2018-09-12 DIAGNOSIS — R278 Other lack of coordination: Secondary | ICD-10-CM | POA: Diagnosis not present

## 2018-09-12 DIAGNOSIS — G3281 Cerebellar ataxia in diseases classified elsewhere: Secondary | ICD-10-CM | POA: Diagnosis not present

## 2018-09-13 DIAGNOSIS — F419 Anxiety disorder, unspecified: Secondary | ICD-10-CM | POA: Diagnosis not present

## 2018-09-13 DIAGNOSIS — F331 Major depressive disorder, recurrent, moderate: Secondary | ICD-10-CM | POA: Diagnosis not present

## 2018-09-15 DIAGNOSIS — R319 Hematuria, unspecified: Secondary | ICD-10-CM | POA: Diagnosis not present

## 2018-09-15 DIAGNOSIS — Z79899 Other long term (current) drug therapy: Secondary | ICD-10-CM | POA: Diagnosis not present

## 2018-09-15 DIAGNOSIS — N390 Urinary tract infection, site not specified: Secondary | ICD-10-CM | POA: Diagnosis not present

## 2018-09-17 DIAGNOSIS — N390 Urinary tract infection, site not specified: Secondary | ICD-10-CM | POA: Diagnosis not present

## 2018-09-17 DIAGNOSIS — R54 Age-related physical debility: Secondary | ICD-10-CM | POA: Diagnosis not present

## 2018-09-17 DIAGNOSIS — J9601 Acute respiratory failure with hypoxia: Secondary | ICD-10-CM | POA: Diagnosis not present

## 2018-09-17 DIAGNOSIS — H532 Diplopia: Secondary | ICD-10-CM | POA: Diagnosis not present

## 2018-09-20 DIAGNOSIS — F331 Major depressive disorder, recurrent, moderate: Secondary | ICD-10-CM | POA: Diagnosis not present

## 2018-09-20 DIAGNOSIS — F419 Anxiety disorder, unspecified: Secondary | ICD-10-CM | POA: Diagnosis not present

## 2018-09-26 DIAGNOSIS — G4701 Insomnia due to medical condition: Secondary | ICD-10-CM | POA: Diagnosis not present

## 2018-09-26 DIAGNOSIS — F331 Major depressive disorder, recurrent, moderate: Secondary | ICD-10-CM | POA: Diagnosis not present

## 2018-09-27 DIAGNOSIS — F331 Major depressive disorder, recurrent, moderate: Secondary | ICD-10-CM | POA: Diagnosis not present

## 2018-09-27 DIAGNOSIS — F419 Anxiety disorder, unspecified: Secondary | ICD-10-CM | POA: Diagnosis not present

## 2018-10-02 DIAGNOSIS — J9611 Chronic respiratory failure with hypoxia: Secondary | ICD-10-CM | POA: Diagnosis not present

## 2018-10-02 DIAGNOSIS — H532 Diplopia: Secondary | ICD-10-CM | POA: Diagnosis not present

## 2018-10-02 DIAGNOSIS — G3281 Cerebellar ataxia in diseases classified elsewhere: Secondary | ICD-10-CM | POA: Diagnosis not present

## 2018-10-02 DIAGNOSIS — R54 Age-related physical debility: Secondary | ICD-10-CM | POA: Diagnosis not present

## 2018-10-04 DIAGNOSIS — F331 Major depressive disorder, recurrent, moderate: Secondary | ICD-10-CM | POA: Diagnosis not present

## 2018-10-04 DIAGNOSIS — F419 Anxiety disorder, unspecified: Secondary | ICD-10-CM | POA: Diagnosis not present

## 2018-10-05 DIAGNOSIS — G894 Chronic pain syndrome: Secondary | ICD-10-CM | POA: Diagnosis not present

## 2018-10-05 DIAGNOSIS — G609 Hereditary and idiopathic neuropathy, unspecified: Secondary | ICD-10-CM | POA: Diagnosis not present

## 2018-10-10 ENCOUNTER — Encounter: Payer: Self-pay | Admitting: Neurology

## 2018-10-10 ENCOUNTER — Ambulatory Visit (INDEPENDENT_AMBULATORY_CARE_PROVIDER_SITE_OTHER): Payer: Medicare Other | Admitting: Neurology

## 2018-10-10 VITALS — BP 121/67 | HR 41 | Ht 65.0 in

## 2018-10-10 DIAGNOSIS — G118 Other hereditary ataxias: Secondary | ICD-10-CM | POA: Diagnosis not present

## 2018-10-10 NOTE — Progress Notes (Signed)
PATIENT: Wendy Erickson DOB: [birth year]  REASON FOR VISIT: follow up HISTORY FROM: patient  HISTORY OF PRESENT ILLNESS: Today 10/10/18  Wendy Erickson is a 78 year old female with a history of spinocerebellar ataxia type III.  She has severe ataxia of all 4 extremities. she resides at [facility].  In November 2019 she was tapered off carbamazepine for anemia, low hemoglobin of 3.7.  She denies any pain related to her prior right-sided trigeminal neuralgia.  She was increased on gabapentin taking 300 mg 3 times daily.  Per her med list from [facility] she is taking a larger dose of gabapentin at bedtime to help with increased nocturnal leg pain.  She complains of some leg discomfort at night however the gabapentin does help.  She reports her swallowing is doing okay however she does not have much of an appetite.  She has finished physical therapy but someone comes to her room daily to do exercises.  She currently is in a wheelchair full-time.  She is not able to ambulate she requires assistance with all of her ADLs.  She is in the assisted living portion of [facility].  She reports she had a fall last Thursday where she fell out of the wheelchair when leaning over.  She presents today for follow-up accompanied by a caregiver from [place].  She reports she is being treated for a urinary tract infection and they will be doing a repeat urine soon.  She is wearing oxygen via nasal cannula at all times.   HISTORY 02/06/2018 MM Wendy Erickson is a 78 year old female with a history of spinaocerebellar ataxia type III.  She returns today for follow-up.  [facility] called asking for a sooner appointment due to weakness and a decline in her functional status.  The patient states that her weakness has gotten better.  Reviewing her notes from the facility it appears that she is being treated for a urinary tract infection.  She states that her weakness is slowly improving.  She does note that she has  trouble swallowing solids.  This is been ongoing for several months.  She reports that she was referred to speech therapy for swallowing but she was unable to pay the co-pay.  She continues on carbamazepine.  Denies any issues with trigeminal neuralgia.  She remains on gabapentin for neuropathy and reports that it has been beneficial.  She returns today for evaluation. REVIEW OF SYSTEMS: Out of a complete 14 system review of symptoms, the patient complains only of the following symptoms, and all other reviewed systems are negative.  The change, appetite change, fatigue, runny nose, double vision, leg swelling, excessive thirst, black stools, constipation, difficulty urinating, painful urination, back pain, aching muscles, muscle cramps, walking difficulty, neck stiffness, numbness, speech difficulty, weakness, tremors, decrease in concentration, depression  ALLERGIES: Allergies  Allergen Reactions  . Pineapple     HOME MEDICATIONS: Outpatient Medications Prior to Visit  Medication Sig Dispense Refill  . acetaminophen (TYLENOL) 325 MG tablet Take 650 mg by mouth every 4 (four) hours as needed for moderate pain or fever (max 10 in 24 hours).    Marland Kitchen atorvastatin (LIPITOR) 40 MG tablet Take 40 mg by mouth at bedtime.     . Calcium Carbonate-Vitamin D (CALCARB 600/D) 600-400 MG-UNIT per tablet Take 1 tablet by mouth daily.     . cetirizine (ZYRTEC) 10 MG tablet Take 10 mg by mouth at bedtime.     . Eyelid Cleansers (OCUSOFT EYELID CLEANSING) PADS Apply 1 application topically 2 (  two) times daily.    . ferrous sulfate 325 (65 FE) MG EC tablet Take 1 tablet (325 mg total) by mouth 2 (two) times daily. (Patient taking differently: Take 325 mg by mouth 3 (three) times daily. ) 60 tablet 3  . gabapentin (NEURONTIN) 300 MG capsule Take 1 capsule (300 mg total) by mouth 3 (three) times daily. 90 capsule 11  . HYDROcodone-acetaminophen (NORCO/VICODIN) 5-325 MG tablet Take 1 tablet by mouth every 8 (eight)  hours.    . hypromellose (GENTEAL SEVERE) 0.3 % GEL ophthalmic ointment Place 1 application into both eyes at bedtime.    . magnesium hydroxide (MILK OF MAGNESIA) 400 MG/5ML suspension Take 30 mLs by mouth daily as needed for mild constipation.    . metoprolol tartrate (LOPRESSOR) 50 MG tablet Take 50 mg by mouth 2 (two) times daily.    . Mouthwashes (BIOTENE DRY MOUTH MT) Use as directed 2 sprays in the mouth or throat 3 (three) times daily.    . NON FORMULARY Take by mouth daily. Magic Cup.    . NON FORMULARY Take by mouth 2 (two) times daily. Med pass. 120 cc    . nystatin (MYCOSTATIN) 100000 UNIT/ML suspension Take 10 mLs by mouth 4 (four) times daily.    . ondansetron (ZOFRAN) 4 MG tablet Take 4 mg by mouth 4 (four) times daily as needed for nausea or vomiting.    . pantoprazole (PROTONIX) 20 MG tablet Take 20 mg by mouth daily.    . polycarbophil (FIBERCON) 625 MG tablet Take 625 mg by mouth 2 (two) times daily.     . polyethylene glycol (MIRALAX) packet Take 17 g by mouth daily as needed for moderate constipation. 10 each 0  . senna (SENOKOT) 8.6 MG tablet Take 1 tablet by mouth 2 (two) times daily.     . sertraline (ZOLOFT) 100 MG tablet Take 100 mg by mouth daily. Take with 25 mg to equal 125 mg    . sertraline (ZOLOFT) 25 MG tablet Take 25 mg by mouth daily. Take with 100 mg to equal 125 mg    . carbamazepine (TEGRETOL) 200 MG tablet 1/2 tablet twice a day for 1 week, then stop the medication 60 tablet 5  . ENSURE (ENSURE) Take 237 mLs by mouth 3 (three) times daily between meals.    . Melatonin 1 MG TABS Take 1 mg by mouth at bedtime.     . metoprolol tartrate (LOPRESSOR) 25 MG tablet Take 0.5 tablets (12.5 mg total) by mouth 2 (two) times daily.    . pantoprazole (PROTONIX) 40 MG tablet Take 40 mg by mouth daily.     No facility-administered medications prior to visit.     PAST MEDICAL HISTORY: Past Medical History:  Diagnosis Date  . Abnormality of gait 12/05/2014  . Arthritis     "knees" (05/14/2014)  . 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 Chronic kidney disease    left renal neoplasm  . Depression   . Dysphagia, pharyngoesophageal phase 12/05/2014  . Foot fracture, right   . Gait disorder   . Heart murmur   . High cholesterol   . History of gout   . Hypertension   . Kidney malignancy ([place])    left renal neoplasm  . Osteopenia   . Peripheral neuropathy   . SCA-3 (spinocerebellar ataxia type 3) ([place]) 05/13/2013  . Shingles    Right V1 distribution  . Symptomatic anemia 06/21/2018  . Trigeminal neuralgia of right side of face  06/09/2015   V1 distribution    PAST SURGICAL HISTORY: Past Surgical History:  Procedure Laterality Date  . BACK SURGERY    . CATARACT EXTRACTION W/ INTRAOCULAR LENS IMPLANT Left   . DILATION AND CURETTAGE OF UTERUS    . KNEE ARTHROSCOPY Bilateral   . LAPAROSCOPIC NEPHRECTOMY  10/06/2011   Procedure: LAPAROSCOPIC NEPHRECTOMY;  Surgeon: Dutch Gray, MD;  Location: [facility];  Service: Urology;  Laterality: Left;  LEFT LAPAROSCOPIC RADICAL NEPHRECTOMY   . LUMBAR DISC SURGERY     "herniated disc"  . TONSILLECTOMY    . VAGINAL HYSTERECTOMY      FAMILY HISTORY: Family History  Problem Relation Age of Onset  . Ataxia Father   . Dementia Mother     SOCIAL HISTORY: Social History   Socioeconomic History  . Marital status: Single    Spouse name: Not on file  . Number of children: 1  . Years of education: college 2  . Highest education level: Not on file  Occupational History  . Occupation: retired    Fish farm manager: RETIRED  Social Needs  . Financial resource strain: Not on file  . Food insecurity:    Worry: Not on file    Inability: Not on file  . Transportation needs:    Medical: Not on file    Non-medical: Not on file  Tobacco Use  . Smoking status: Former Smoker    Packs/day: 1.00    Years: 40.00    Pack years: 40.00    Types: Cigarettes    Last attempt to quit: 08/08/2005    Years since quitting: 13.1  .  Smokeless tobacco: Never Used  Substance and Sexual Activity  . Alcohol use: No    Comment: wine on Friday  . Drug use: No  . Sexual activity: Not Currently  Lifestyle  . Physical activity:    Days per week: Not on file    Minutes per session: Not on file  . Stress: Not on file  Relationships  . Social connections:    Talks on phone: Not on file    Gets together: Not on file    Attends religious service: Not on file    Active member of club or organization: Not on file    Attends meetings of clubs or organizations: Not on file    Relationship status: Not on file  . Intimate partner violence:    Fear of current or ex partner: Not on file    Emotionally abused: Not on file    Physically abused: Not on file    Forced sexual activity: Not on file  Other Topics Concern  . Not on file  Social History Narrative   Patient is left handed.   Patient does not drink caffeine.   Resides at [facility].      PHYSICAL EXAM  Vitals:   10/10/18 1104  BP: 121/67  Pulse: (!) 41  Height: 5\' 5"  (1.651 m)   Body mass index is 29.35 kg/m.  Generalized: Well developed, in no acute distress   Neurological examination  Mentation: Alert oriented to time, place, history taking. Follows all commands speech and language fluent Cranial nerve II-XII: Pupils were equal round reactive to light. Extraocular movements were full, visual field were full on confrontational test. Facial sensation and strength were normal. Uvula tongue midline. Head turning and shoulder shrug  were normal and symmetric. Motor: The motor testing reveals 4 over 5 strength of all 4 extremities. Good symmetric motor tone is noted  throughout.  Sensory: Sensory testing is intact to soft touch on all 4 extremities. No evidence of extinction is noted.  Coordination: Cerebellar testing reveals severe ataxia with finger-nose-finger and heel-to-shin bilaterally.  Gait and station: She is in a wheelchair Reflexes: Deep  tendon reflexes are symmetric and decreased bilaterally  DIAGNOSTIC DATA (LABS, IMAGING, TESTING) - I reviewed patient records, labs, notes, testing and imaging myself where available.  Lab Results  Component Value Date   WBC 15.3 (H) 06/27/2018   HGB 8.9 (L) 06/27/2018   HCT 32.0 (L) 06/27/2018   MCV 83.3 06/27/2018   PLT 330 06/27/2018      Component Value Date/Time   NA 137 06/27/2018 1812   NA 147 (H) 12/05/2014 1135   K 4.2 06/27/2018 1812   CL 103 06/27/2018 1812   CO2 25 06/27/2018 1812   GLUCOSE 101 (H) 06/27/2018 1812   BUN 18 06/27/2018 1812   BUN 24 12/05/2014 1135   CREATININE 0.80 06/27/2018 1812   CALCIUM 8.8 (L) 06/27/2018 1812   PROT 5.9 (L) 06/20/2018 2325   PROT 6.4 12/05/2014 1135   ALBUMIN 3.0 (L) 06/20/2018 2325   ALBUMIN 3.9 12/05/2014 1135   AST 19 06/20/2018 2325   ALT 16 06/20/2018 2325   ALKPHOS 48 06/20/2018 2325   BILITOT 0.4 06/20/2018 2325   BILITOT <0.2 12/05/2014 1135   GFRNONAA >60 06/27/2018 1812   GFRAA >60 06/27/2018 1812   No results found for: CHOL, HDL, LDLCALC, LDLDIRECT, TRIG, CHOLHDL No results found for: HGBA1C No results found for: VITAMINB12 No results found for: TSH    ASSESSMENT AND PLAN 78 y.o. 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 here with:  1.  Spinocerebellar ataxia type III   She is in a wheelchair and is nonambulatory.  She resides in an assisted living community and requires assistance with all of her ADLs.  She will  continue taking gabapentin for her neuropathy.  She denies any problems with her trigeminal neuralgia and her carbamazepine was stopped in November 2019 as result of anemia.  Her heart rate was noted to be low today but she reports this is her baseline.  I advised her to continue to monitor this.  She denies any symptoms related to this.  She will follow-up in 6 months.  We did discuss that she could cancel this appointment if she does not feel she has any concerns.  She can follow-up on an as-needed basis in the future.  I spent 15 minutes with the patient and 50% of the time was spent discussing her plan of care.    Butler Denmark, AGNP-C, DNP 10/10/2018, 11:49 AM [facility] [address] [place], [place] [address] [phone]

## 2018-10-10 NOTE — Progress Notes (Signed)
I have read the note, and I agree with the clinical assessment and plan.  Charles K Willis   

## 2018-10-18 DIAGNOSIS — F419 Anxiety disorder, unspecified: Secondary | ICD-10-CM | POA: Diagnosis not present

## 2018-10-18 DIAGNOSIS — F331 Major depressive disorder, recurrent, moderate: Secondary | ICD-10-CM | POA: Diagnosis not present

## 2018-10-19 DIAGNOSIS — Z79899 Other long term (current) drug therapy: Secondary | ICD-10-CM | POA: Diagnosis not present

## 2018-10-19 DIAGNOSIS — R319 Hematuria, unspecified: Secondary | ICD-10-CM | POA: Diagnosis not present

## 2018-10-19 DIAGNOSIS — N390 Urinary tract infection, site not specified: Secondary | ICD-10-CM | POA: Diagnosis not present

## 2018-10-24 DIAGNOSIS — F331 Major depressive disorder, recurrent, moderate: Secondary | ICD-10-CM | POA: Diagnosis not present

## 2018-10-24 DIAGNOSIS — G4701 Insomnia due to medical condition: Secondary | ICD-10-CM | POA: Diagnosis not present

## 2018-10-25 DIAGNOSIS — F331 Major depressive disorder, recurrent, moderate: Secondary | ICD-10-CM | POA: Diagnosis not present

## 2018-10-25 DIAGNOSIS — F419 Anxiety disorder, unspecified: Secondary | ICD-10-CM | POA: Diagnosis not present

## 2018-10-31 DIAGNOSIS — J101 Influenza due to other identified influenza virus with other respiratory manifestations: Secondary | ICD-10-CM | POA: Diagnosis not present

## 2018-11-07 DIAGNOSIS — M6281 Muscle weakness (generalized): Secondary | ICD-10-CM | POA: Diagnosis not present

## 2018-11-07 DIAGNOSIS — R279 Unspecified lack of coordination: Secondary | ICD-10-CM | POA: Diagnosis not present

## 2018-11-07 DIAGNOSIS — G3281 Cerebellar ataxia in diseases classified elsewhere: Secondary | ICD-10-CM | POA: Diagnosis not present

## 2018-11-07 DIAGNOSIS — K1230 Oral mucositis (ulcerative), unspecified: Secondary | ICD-10-CM | POA: Diagnosis not present

## 2018-11-08 DIAGNOSIS — F331 Major depressive disorder, recurrent, moderate: Secondary | ICD-10-CM | POA: Diagnosis not present

## 2018-11-08 DIAGNOSIS — F419 Anxiety disorder, unspecified: Secondary | ICD-10-CM | POA: Diagnosis not present

## 2018-11-09 DIAGNOSIS — G3281 Cerebellar ataxia in diseases classified elsewhere: Secondary | ICD-10-CM | POA: Diagnosis not present

## 2018-11-09 DIAGNOSIS — R279 Unspecified lack of coordination: Secondary | ICD-10-CM | POA: Diagnosis not present

## 2018-11-09 DIAGNOSIS — M6281 Muscle weakness (generalized): Secondary | ICD-10-CM | POA: Diagnosis not present

## 2018-11-12 DIAGNOSIS — G894 Chronic pain syndrome: Secondary | ICD-10-CM | POA: Diagnosis not present

## 2018-11-12 DIAGNOSIS — G609 Hereditary and idiopathic neuropathy, unspecified: Secondary | ICD-10-CM | POA: Diagnosis not present

## 2018-11-22 DIAGNOSIS — H532 Diplopia: Secondary | ICD-10-CM | POA: Diagnosis not present

## 2018-11-22 DIAGNOSIS — R54 Age-related physical debility: Secondary | ICD-10-CM | POA: Diagnosis not present

## 2018-11-22 DIAGNOSIS — G3281 Cerebellar ataxia in diseases classified elsewhere: Secondary | ICD-10-CM | POA: Diagnosis not present

## 2018-11-22 DIAGNOSIS — J9611 Chronic respiratory failure with hypoxia: Secondary | ICD-10-CM | POA: Diagnosis not present

## 2018-11-25 DIAGNOSIS — N390 Urinary tract infection, site not specified: Secondary | ICD-10-CM | POA: Diagnosis not present

## 2018-11-25 DIAGNOSIS — R300 Dysuria: Secondary | ICD-10-CM | POA: Diagnosis not present

## 2018-11-25 DIAGNOSIS — Z79899 Other long term (current) drug therapy: Secondary | ICD-10-CM | POA: Diagnosis not present

## 2018-11-26 DIAGNOSIS — R300 Dysuria: Secondary | ICD-10-CM | POA: Diagnosis not present

## 2018-11-26 DIAGNOSIS — R319 Hematuria, unspecified: Secondary | ICD-10-CM | POA: Diagnosis not present

## 2018-11-28 DIAGNOSIS — N390 Urinary tract infection, site not specified: Secondary | ICD-10-CM | POA: Diagnosis not present

## 2018-12-05 DIAGNOSIS — N390 Urinary tract infection, site not specified: Secondary | ICD-10-CM | POA: Diagnosis not present

## 2018-12-05 DIAGNOSIS — G3281 Cerebellar ataxia in diseases classified elsewhere: Secondary | ICD-10-CM | POA: Diagnosis not present

## 2018-12-05 DIAGNOSIS — J029 Acute pharyngitis, unspecified: Secondary | ICD-10-CM | POA: Diagnosis not present

## 2018-12-05 DIAGNOSIS — J9611 Chronic respiratory failure with hypoxia: Secondary | ICD-10-CM | POA: Diagnosis not present

## 2018-12-26 DIAGNOSIS — G4701 Insomnia due to medical condition: Secondary | ICD-10-CM | POA: Diagnosis not present

## 2018-12-26 DIAGNOSIS — F331 Major depressive disorder, recurrent, moderate: Secondary | ICD-10-CM | POA: Diagnosis not present

## 2019-01-07 DIAGNOSIS — G894 Chronic pain syndrome: Secondary | ICD-10-CM | POA: Diagnosis not present

## 2019-01-07 DIAGNOSIS — G609 Hereditary and idiopathic neuropathy, unspecified: Secondary | ICD-10-CM | POA: Diagnosis not present

## 2019-01-11 ENCOUNTER — Ambulatory Visit (INDEPENDENT_AMBULATORY_CARE_PROVIDER_SITE_OTHER)
Admission: EM | Admit: 2019-01-11 | Discharge: 2019-01-11 | Disposition: A | Discharge status: Home / Self Care | Payer: Medicare Other | Source: Home / Self Care | Attending: Family Medicine | Admitting: Family Medicine

## 2019-01-11 ENCOUNTER — Emergency Department (HOSPITAL_COMMUNITY): Payer: Medicare Other

## 2019-01-11 ENCOUNTER — Other Ambulatory Visit: Payer: Self-pay

## 2019-01-11 ENCOUNTER — Emergency Department (HOSPITAL_COMMUNITY)
Admission: EM | Admit: 2019-01-11 | Discharge: 2019-01-11 | Disposition: A | Discharge status: Home / Self Care | Payer: Medicare Other | Attending: Emergency Medicine | Admitting: Emergency Medicine

## 2019-01-11 ENCOUNTER — Encounter (HOSPITAL_COMMUNITY): Payer: Self-pay

## 2019-01-11 DIAGNOSIS — Y929 Unspecified place or not applicable: Secondary | ICD-10-CM | POA: Insufficient documentation

## 2019-01-11 DIAGNOSIS — R5381 Other malaise: Secondary | ICD-10-CM | POA: Diagnosis not present

## 2019-01-11 DIAGNOSIS — Y999 Unspecified external cause status: Secondary | ICD-10-CM | POA: Insufficient documentation

## 2019-01-11 DIAGNOSIS — T1490XA Injury, unspecified, initial encounter: Secondary | ICD-10-CM

## 2019-01-11 DIAGNOSIS — S0990XA Unspecified injury of head, initial encounter: Secondary | ICD-10-CM | POA: Diagnosis not present

## 2019-01-11 DIAGNOSIS — R51 Headache: Secondary | ICD-10-CM | POA: Diagnosis not present

## 2019-01-11 DIAGNOSIS — W19XXXA Unspecified fall, initial encounter: Secondary | ICD-10-CM | POA: Diagnosis not present

## 2019-01-11 DIAGNOSIS — S01311A Laceration without foreign body of right ear, initial encounter: Secondary | ICD-10-CM | POA: Diagnosis not present

## 2019-01-11 DIAGNOSIS — I10 Essential (primary) hypertension: Secondary | ICD-10-CM | POA: Diagnosis not present

## 2019-01-11 DIAGNOSIS — Y939 Activity, unspecified: Secondary | ICD-10-CM | POA: Diagnosis not present

## 2019-01-11 DIAGNOSIS — Z85528 Personal history of other malignant neoplasm of kidney: Secondary | ICD-10-CM | POA: Diagnosis not present

## 2019-01-11 DIAGNOSIS — W050XXA Fall from non-moving wheelchair, initial encounter: Secondary | ICD-10-CM | POA: Insufficient documentation

## 2019-01-11 DIAGNOSIS — M2550 Pain in unspecified joint: Secondary | ICD-10-CM | POA: Diagnosis not present

## 2019-01-11 DIAGNOSIS — Z7401 Bed confinement status: Secondary | ICD-10-CM | POA: Diagnosis not present

## 2019-01-11 DIAGNOSIS — R001 Bradycardia, unspecified: Secondary | ICD-10-CM | POA: Diagnosis not present

## 2019-01-11 DIAGNOSIS — R296 Repeated falls: Secondary | ICD-10-CM | POA: Diagnosis not present

## 2019-01-11 NOTE — ED Provider Notes (Signed)
[facility] EMERGENCY DEPARTMENT Provider Note   CSN: [id number] Arrival date & time: 01/11/19  1138    History   Chief Complaint Chief Complaint  Patient presents with  . Fall  . Head Injury    HPI Wendy Erickson is a 78 y.o. female.     The history is provided by the patient. No language interpreter was used.  Fall  This is a new problem. The current episode started 1 to 2 hours ago. The problem occurs constantly. The problem has not changed since onset.Associated symptoms include headaches. Pertinent negatives include no chest pain and no abdominal pain. Nothing aggravates the symptoms. Nothing relieves the symptoms. She has tried nothing for the symptoms. The treatment provided no relief.  Head Injury  Location:  R parietal Time since incident:  2 hours Pain details:    Quality:  Aching   Radiates to:  R ear   Timing:  Constant Chronicity:  New Relieved by:  Nothing Worsened by:  Nothing Ineffective treatments:  None tried Associated symptoms: headache    Pt was leaning down and fell out of her wheel chair.  Pt has a skin tear to right ear. Past Medical History:  Diagnosis Date  . Abnormality of gait 12/05/2014  . Arthritis    "knees" (05/14/2014)  . 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 Chronic kidney disease    left renal neoplasm  . Depression   . Dysphagia, pharyngoesophageal phase 12/05/2014  . Foot fracture, right   . Gait disorder   . Heart murmur   . High cholesterol   . History of gout   . Hypertension   . 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 Osteopenia   . Peripheral neuropathy   . SCA-3 (spinocerebellar ataxia type 3) ([place]) 05/13/2013  . Shingles    Right V1 distribution  . Symptomatic anemia 06/21/2018  . Trigeminal neuralgia of right side of face 06/09/2015   V1 distribution    Patient Active Problem List   Diagnosis Date Noted  . Symptomatic anemia 06/21/2018  . Microcytic hypochromic anemia  06/21/2018  . Fall   . Subarachnoid hemorrhage ([place]) 05/25/2016  . Subdural hematoma ([place]) 05/25/2016  . SAH (subarachnoid hemorrhage) ([place]) 05/25/2016  . Laceration of face-right supraorbital 05/25/2016  . Trigeminal neuralgia of right side of face 06/09/2015  . Abnormality of gait 12/05/2014  . Dysphagia, pharyngoesophageal phase 12/05/2014  . Cholelithiasis 05/14/2014  . Abdominal pain, epigastric 05/14/2014  . Systolic murmur 93/81/8299  . Chest pain 05/11/2014  . Atypical chest pain 05/11/2014  . SCA-3 (spinocerebellar ataxia type 3) ([place]) 05/13/2013  . Hydronephrosis of right kidney 04/13/2013  . Absent kidney, acquired 04/13/2013  . Pyelonephritis 04/11/2013  . AKI (acute kidney injury) ([place]) 04/11/2013  . HTN (hypertension) 04/11/2013    Past Surgical History:  Procedure Laterality Date  . BACK SURGERY    . CATARACT EXTRACTION W/ INTRAOCULAR LENS IMPLANT Left   . DILATION AND CURETTAGE OF UTERUS    . KNEE ARTHROSCOPY Bilateral   . LAPAROSCOPIC NEPHRECTOMY  10/06/2011   Procedure: LAPAROSCOPIC NEPHRECTOMY;  Surgeon: Dutch Gray, MD;  Location: [facility];  Service: Urology;  Laterality: Left;  LEFT LAPAROSCOPIC RADICAL NEPHRECTOMY   . LUMBAR DISC SURGERY     "herniated disc"  . TONSILLECTOMY    . VAGINAL HYSTERECTOMY       OB History   No obstetric history on file.      Home Medications  Prior to Admission medications   Medication Sig Start Date End Date Taking? Authorizing Provider  acetaminophen (TYLENOL) 325 MG tablet Take 650 mg by mouth every 4 (four) hours as needed for moderate pain or fever (max 10 in 24 hours).    [provider]  atorvastatin (LIPITOR) 40 MG tablet Take 40 mg by mouth at bedtime.     [provider]  Calcium Carbonate-Vitamin D (CALCARB 600/D) 600-400 MG-UNIT per tablet Take 1 tablet by mouth daily.     [provider]  cetirizine (ZYRTEC) 10 MG tablet Take 10 mg by mouth at bedtime.     [provider]  Eyelid Cleansers (OCUSOFT EYELID CLEANSING) PADS Apply 1 application topically 2 (two) times daily.    [provider]  ferrous sulfate 325 (65 FE) MG EC tablet Take 1 tablet (325 mg total) by mouth 2 (two) times daily. Patient taking differently: Take 325 mg by mouth 3 (three) times daily.  06/22/18 06/22/19  Elgergawy, Silver Huguenin, MD  gabapentin (NEURONTIN) 300 MG capsule Take 1 capsule (300 mg total) by mouth 3 (three) times daily. 07/03/18   Kathrynn Ducking, MD  HYDROcodone-acetaminophen (NORCO/VICODIN) 5-325 MG tablet Take 1 tablet by mouth every 8 (eight) hours.    [provider]  hypromellose (GENTEAL SEVERE) 0.3 % GEL ophthalmic ointment Place 1 application into both eyes at bedtime.    [provider]  magnesium hydroxide (MILK OF MAGNESIA) 400 MG/5ML suspension Take 30 mLs by mouth daily as needed for mild constipation.    [provider]  metoprolol tartrate (LOPRESSOR) 50 MG tablet Take 50 mg by mouth 2 (two) times daily.    [provider]  Mouthwashes (BIOTENE DRY MOUTH MT) Use as directed 2 sprays in the mouth or throat 3 (three) times daily.    [provider]  NON FORMULARY Take by mouth daily. Magic Cup.    [provider]  NON FORMULARY Take by mouth 2 (two) times daily. Med pass. 120 cc    [provider]  nystatin (MYCOSTATIN) 100000 UNIT/ML suspension Take 10 mLs by mouth 4 (four) times daily.    [provider]  ondansetron (ZOFRAN) 4 MG tablet Take 4 mg by mouth 4 (four) times daily as needed for nausea or vomiting.    [provider]  pantoprazole (PROTONIX) 20 MG tablet Take 20 mg by mouth daily.    [provider]  polycarbophil (FIBERCON) 625 MG tablet Take 625 mg by mouth 2 (two) times daily.     [provider]  polyethylene glycol (MIRALAX) packet Take 17 g by mouth daily as needed for moderate constipation. 05/14/14   Domenic Polite, MD  senna (SENOKOT) 8.6  MG tablet Take 1 tablet by mouth 2 (two) times daily.     [provider]  sertraline (ZOLOFT) 100 MG tablet Take 100 mg by mouth daily. Take with 25 mg to equal 125 mg    [provider]  sertraline (ZOLOFT) 25 MG tablet Take 25 mg by mouth daily. Take with 100 mg to equal 125 mg    [provider]    Family History Family History  Problem Relation Age of Onset  . Ataxia Father   . Dementia Mother     Social History Social History   Tobacco Use  . Smoking status: Former Smoker    Packs/day: 1.00    Years: 40.00    Pack years: 40.00    Types: Cigarettes  Last attempt to quit: 08/08/2005    Years since quitting: 13.4  . Smokeless tobacco: Never Used  Substance Use Topics  . Alcohol use: No    Comment: wine on Friday  . Drug use: No     Allergies   Pineapple   Review of Systems Review of Systems  Cardiovascular: Negative for chest pain.  Gastrointestinal: Negative for abdominal pain.  Skin: Positive for wound.  Neurological: Positive for headaches.  All other systems reviewed and are negative.    Physical Exam Updated Vital Signs BP (!) 164/57   Pulse (!) 40   Temp 98.2 F (36.8 C) (Oral)   Resp (!) 22   Wt 80 kg   SpO2 95%   BMI 29.35 kg/m   Physical Exam Vitals signs reviewed.  Constitutional:      Appearance: Normal appearance.  HENT:     Head: Normocephalic.     Right Ear: Tympanic membrane normal.     Nose: Nose normal.     Mouth/Throat:     Mouth: Mucous membranes are moist.  Eyes:     Pupils: Pupils are equal, round, and reactive to light.  Neck:     Musculoskeletal: Normal range of motion.  Cardiovascular:     Rate and Rhythm: Normal rate.     Pulses: Normal pulses.  Pulmonary:     Effort: Pulmonary effort is normal.  Abdominal:     General: Abdomen is flat.  Musculoskeletal: Normal range of motion.  Skin:    Comments: 2cm laceration right ear   Neurological:     General: No focal deficit present.      Mental Status: She is alert.  Psychiatric:        Mood and Affect: Mood normal.      ED Treatments / Results  Labs (all labs ordered are listed, but only abnormal results are displayed) Labs Reviewed - No data to display  EKG None  Radiology Ct Head Wo Contrast  Result Date: 01/11/2019 CLINICAL DATA:  Head injury, fall from wheelchair EXAM: CT HEAD WITHOUT CONTRAST TECHNIQUE: Contiguous axial images were obtained from the base of the skull through the vertex without intravenous contrast. COMPARISON:  06/27/2018 FINDINGS: Brain: No evidence of acute infarction, hemorrhage, hydrocephalus, extra-axial collection or mass lesion/mass effect. Vascular: No hyperdense vessel or unexpected calcification. Skull: Normal. Negative for fracture or focal lesion. Sinuses/Orbits: No acute finding. Other: None. IMPRESSION: No acute intracranial pathology. Electronically Signed   By: Eddie Candle M.D.   On: 01/11/2019 13:33    Procedures .Marland KitchenLaceration Repair Date/Time: 01/11/2019 2:21 PM Performed by: Fransico Meadow, PA-C Authorized by: Fransico Meadow, PA-C   Consent:    Consent obtained:  Verbal   Consent given by:  Patient   Alternatives discussed:  No treatment Anesthesia (see MAR for exact dosages):    Anesthesia method:  None Laceration details:    Location:  Ear   Length (cm):  2 Repair type:    Repair type:  Simple Pre-procedure details:    Preparation:  Patient was prepped and draped in usual sterile fashion Exploration:    Wound exploration: wound explored through full range of motion   Treatment:    Area cleansed with:  Betadine   Amount of cleaning:  Standard   Irrigation solution:  Sterile saline Skin repair:    Repair method:  Tissue adhesive Approximation:    Approximation:  Loose Post-procedure details:    Dressing:  Open (no dressing)   Patient tolerance of procedure:  Tolerated well, no immediate complications   (including critical care time)  Medications Ordered  in ED Medications - No data to display   Initial Impression / Assessment and Plan / ED Course  I have reviewed the triage vital signs and the nursing notes.  Pertinent labs & imaging results that were available during my care of the patient were reviewed by me and considered in my medical decision making (see chart for details).          Final Clinical Impressions(s) / ED Diagnoses   Final diagnoses:  Injury of head, initial encounter  Laceration of antihelix of right ear, initial encounter    ED Discharge Orders    None    An After Visit Summary was printed and given to the patient.    Sidney Ace 01/11/19 1422    Jola Schmidt, MD 01/11/19 [phone]

## 2019-01-11 NOTE — ED Triage Notes (Signed)
Patient presents to Urgent Care with complaints of needing stitches on her left ear since falling out of her wheelchair this morning. Patient reports she does not think she lost consciousness, denies blood thinner use.

## 2019-01-11 NOTE — ED Notes (Signed)
Patient is being transferred from the Urgent [facility] to the Emergency Department via wheelchair by staff. Patient is stable but in need of higher level of care due to need for CT scan post-fall. Patient is aware and verbalizes understanding of plan of care.  Vitals:   01/11/19 1112  BP: (!) 137/119  Pulse: (!) 46  Resp: 18  Temp: 98.1 F (36.7 C)  SpO2: 90%

## 2019-01-11 NOTE — Discharge Instructions (Signed)
Due to your history and mechanism of fall I would like you to receive further evaluation in the ER at this time.

## 2019-01-11 NOTE — ED Notes (Signed)
ED Provider at bedside for evaluation of possible discharge to ED with concern for intracranial bleeding and need for CT scan r/t mechanism of injury.

## 2019-01-11 NOTE — ED Triage Notes (Signed)
Pt sent by UC for eval of head injury after falling out of her WC this am. Denies any blood thinner use or LOC.

## 2019-01-11 NOTE — ED Provider Notes (Signed)
Wendy Erickson presents with complaints of fall today. She is not entirely sure what time the fall was. She states she was leaning forward to pick up something and fell forward out of her wheelchair, striking her head and ear on the way down. She hit the wheel of the bed and the tile floor. She has bleeding from her right ear. She "doesn't think" she lost consciousness and a staff member who brought her here doesn't think she did either. She has right shoulder pain. She typically wears oxygen. Per chart review hx of subarachnoid bleed. Per staff member patient at baseline behavior and orientation without changes to mentation. Patient aware of day of week and year. Face appears symmetrical and no pronator drift visible. With her history and traumatic fall I do feel it is necessary for evaluation in the ER. Wheelchair assistance by RN provided.     Zigmund Gottron, NP 01/11/2019 11:38 AM    Zigmund Gottron, NP 01/11/19 1138

## 2019-01-11 NOTE — ED Notes (Signed)
[facility] called for transport.  

## 2019-01-14 DIAGNOSIS — R296 Repeated falls: Secondary | ICD-10-CM | POA: Diagnosis not present

## 2019-01-14 DIAGNOSIS — S01311A Laceration without foreign body of right ear, initial encounter: Secondary | ICD-10-CM | POA: Diagnosis not present

## 2019-01-14 DIAGNOSIS — S0990XA Unspecified injury of head, initial encounter: Secondary | ICD-10-CM | POA: Diagnosis not present

## 2019-01-16 DIAGNOSIS — R279 Unspecified lack of coordination: Secondary | ICD-10-CM | POA: Diagnosis not present

## 2019-01-16 DIAGNOSIS — M6281 Muscle weakness (generalized): Secondary | ICD-10-CM | POA: Diagnosis not present

## 2019-01-16 DIAGNOSIS — G3281 Cerebellar ataxia in diseases classified elsewhere: Secondary | ICD-10-CM | POA: Diagnosis not present

## 2019-01-21 DIAGNOSIS — I10 Essential (primary) hypertension: Secondary | ICD-10-CM | POA: Diagnosis not present

## 2019-01-28 DIAGNOSIS — G3281 Cerebellar ataxia in diseases classified elsewhere: Secondary | ICD-10-CM | POA: Diagnosis not present

## 2019-01-28 DIAGNOSIS — J9611 Chronic respiratory failure with hypoxia: Secondary | ICD-10-CM | POA: Diagnosis not present

## 2019-01-28 DIAGNOSIS — R5383 Other fatigue: Secondary | ICD-10-CM | POA: Diagnosis not present

## 2019-01-28 DIAGNOSIS — R54 Age-related physical debility: Secondary | ICD-10-CM | POA: Diagnosis not present

## 2019-02-06 DIAGNOSIS — J9611 Chronic respiratory failure with hypoxia: Secondary | ICD-10-CM | POA: Diagnosis not present

## 2019-02-06 DIAGNOSIS — R5383 Other fatigue: Secondary | ICD-10-CM | POA: Diagnosis not present

## 2019-02-06 DIAGNOSIS — R54 Age-related physical debility: Secondary | ICD-10-CM | POA: Diagnosis not present

## 2019-02-06 DIAGNOSIS — G3281 Cerebellar ataxia in diseases classified elsewhere: Secondary | ICD-10-CM | POA: Diagnosis not present

## 2019-02-06 DIAGNOSIS — F331 Major depressive disorder, recurrent, moderate: Secondary | ICD-10-CM | POA: Diagnosis not present

## 2019-02-07 DIAGNOSIS — I10 Essential (primary) hypertension: Secondary | ICD-10-CM | POA: Diagnosis not present

## 2019-02-07 DIAGNOSIS — D649 Anemia, unspecified: Secondary | ICD-10-CM | POA: Diagnosis not present

## 2019-02-20 DIAGNOSIS — G629 Polyneuropathy, unspecified: Secondary | ICD-10-CM | POA: Diagnosis not present

## 2019-02-20 DIAGNOSIS — G8929 Other chronic pain: Secondary | ICD-10-CM | POA: Diagnosis not present

## 2019-03-20 DIAGNOSIS — F331 Major depressive disorder, recurrent, moderate: Secondary | ICD-10-CM | POA: Diagnosis not present

## 2019-04-05 DIAGNOSIS — H04129 Dry eye syndrome of unspecified lacrimal gland: Secondary | ICD-10-CM | POA: Diagnosis not present

## 2019-04-05 DIAGNOSIS — K5900 Constipation, unspecified: Secondary | ICD-10-CM | POA: Diagnosis not present

## 2019-04-05 DIAGNOSIS — K219 Gastro-esophageal reflux disease without esophagitis: Secondary | ICD-10-CM | POA: Diagnosis not present

## 2019-04-05 DIAGNOSIS — F331 Major depressive disorder, recurrent, moderate: Secondary | ICD-10-CM | POA: Diagnosis not present

## 2019-04-07 DIAGNOSIS — Z03818 Encounter for observation for suspected exposure to other biological agents ruled out: Secondary | ICD-10-CM | POA: Diagnosis not present

## 2019-04-08 DIAGNOSIS — K029 Dental caries, unspecified: Secondary | ICD-10-CM | POA: Diagnosis not present

## 2019-04-08 DIAGNOSIS — G8929 Other chronic pain: Secondary | ICD-10-CM | POA: Diagnosis not present

## 2019-04-09 DIAGNOSIS — R54 Age-related physical debility: Secondary | ICD-10-CM | POA: Diagnosis not present

## 2019-04-09 DIAGNOSIS — R270 Ataxia, unspecified: Secondary | ICD-10-CM | POA: Diagnosis not present

## 2019-04-09 DIAGNOSIS — J9611 Chronic respiratory failure with hypoxia: Secondary | ICD-10-CM | POA: Diagnosis not present

## 2019-04-09 DIAGNOSIS — K029 Dental caries, unspecified: Secondary | ICD-10-CM | POA: Diagnosis not present

## 2019-04-17 DIAGNOSIS — Z1159 Encounter for screening for other viral diseases: Secondary | ICD-10-CM | POA: Diagnosis not present

## 2019-04-17 DIAGNOSIS — F331 Major depressive disorder, recurrent, moderate: Secondary | ICD-10-CM | POA: Diagnosis not present

## 2019-04-23 ENCOUNTER — Ambulatory Visit: Payer: Medicare Other | Admitting: Neurology

## 2019-04-23 DIAGNOSIS — Z03818 Encounter for observation for suspected exposure to other biological agents ruled out: Secondary | ICD-10-CM | POA: Diagnosis not present

## 2019-05-06 DIAGNOSIS — G118 Other hereditary ataxias: Secondary | ICD-10-CM | POA: Diagnosis not present

## 2019-05-06 DIAGNOSIS — M6281 Muscle weakness (generalized): Secondary | ICD-10-CM | POA: Diagnosis not present

## 2019-05-10 DIAGNOSIS — G118 Other hereditary ataxias: Secondary | ICD-10-CM | POA: Diagnosis not present

## 2019-05-10 DIAGNOSIS — M6281 Muscle weakness (generalized): Secondary | ICD-10-CM | POA: Diagnosis not present

## 2019-05-15 DIAGNOSIS — G8929 Other chronic pain: Secondary | ICD-10-CM | POA: Diagnosis not present

## 2019-05-15 DIAGNOSIS — F331 Major depressive disorder, recurrent, moderate: Secondary | ICD-10-CM | POA: Diagnosis not present

## 2019-05-20 DIAGNOSIS — R319 Hematuria, unspecified: Secondary | ICD-10-CM | POA: Diagnosis not present

## 2019-05-20 DIAGNOSIS — R54 Age-related physical debility: Secondary | ICD-10-CM | POA: Diagnosis not present

## 2019-05-20 DIAGNOSIS — Z79899 Other long term (current) drug therapy: Secondary | ICD-10-CM | POA: Diagnosis not present

## 2019-05-20 DIAGNOSIS — M6281 Muscle weakness (generalized): Secondary | ICD-10-CM | POA: Diagnosis not present

## 2019-05-20 DIAGNOSIS — R0902 Hypoxemia: Secondary | ICD-10-CM | POA: Diagnosis not present

## 2019-05-20 DIAGNOSIS — F331 Major depressive disorder, recurrent, moderate: Secondary | ICD-10-CM | POA: Diagnosis not present

## 2019-05-20 DIAGNOSIS — G118 Other hereditary ataxias: Secondary | ICD-10-CM | POA: Diagnosis not present

## 2019-05-20 DIAGNOSIS — N390 Urinary tract infection, site not specified: Secondary | ICD-10-CM | POA: Diagnosis not present

## 2019-05-20 DIAGNOSIS — D649 Anemia, unspecified: Secondary | ICD-10-CM | POA: Diagnosis not present

## 2019-05-20 DIAGNOSIS — J9811 Atelectasis: Secondary | ICD-10-CM | POA: Diagnosis not present

## 2019-05-20 DIAGNOSIS — R509 Fever, unspecified: Secondary | ICD-10-CM | POA: Diagnosis not present

## 2019-05-20 DIAGNOSIS — K029 Dental caries, unspecified: Secondary | ICD-10-CM | POA: Diagnosis not present

## 2019-05-20 DIAGNOSIS — J984 Other disorders of lung: Secondary | ICD-10-CM | POA: Diagnosis not present

## 2019-05-20 DIAGNOSIS — R270 Ataxia, unspecified: Secondary | ICD-10-CM | POA: Diagnosis not present

## 2019-05-21 ENCOUNTER — Ambulatory Visit (HOSPITAL_COMMUNITY)
Admission: RE | Admit: 2019-05-21 | Discharge: 2019-05-21 | Disposition: A | Discharge status: Home / Self Care | Payer: Medicare Other | Source: Ambulatory Visit | Attending: Geriatric Medicine | Admitting: Geriatric Medicine

## 2019-05-21 ENCOUNTER — Other Ambulatory Visit (HOSPITAL_COMMUNITY): Payer: Self-pay | Admitting: Geriatric Medicine

## 2019-05-21 ENCOUNTER — Other Ambulatory Visit: Payer: Self-pay

## 2019-05-21 DIAGNOSIS — Z1159 Encounter for screening for other viral diseases: Secondary | ICD-10-CM | POA: Diagnosis not present

## 2019-05-21 DIAGNOSIS — J9620 Acute and chronic respiratory failure, unspecified whether with hypoxia or hypercapnia: Secondary | ICD-10-CM | POA: Diagnosis not present

## 2019-05-21 DIAGNOSIS — M6281 Muscle weakness (generalized): Secondary | ICD-10-CM | POA: Diagnosis not present

## 2019-05-21 DIAGNOSIS — J9621 Acute and chronic respiratory failure with hypoxia: Secondary | ICD-10-CM | POA: Diagnosis not present

## 2019-05-21 DIAGNOSIS — G118 Other hereditary ataxias: Secondary | ICD-10-CM | POA: Diagnosis not present

## 2019-05-21 DIAGNOSIS — N390 Urinary tract infection, site not specified: Secondary | ICD-10-CM | POA: Diagnosis not present

## 2019-05-21 MED ORDER — IOHEXOL 350 MG/ML SOLN
80.0000 mL | Freq: Once | INTRAVENOUS | Status: AC | PRN
  Administered 2019-05-21: 16:00:00 80 mL via INTRAVENOUS

## 2019-05-21 MED ORDER — SODIUM CHLORIDE (PF) 0.9 % IJ SOLN
INTRAMUSCULAR | Status: AC
  Filled 2019-05-21: qty 50

## 2019-05-22 ENCOUNTER — Telehealth: Payer: Self-pay | Admitting: Internal Medicine

## 2019-05-22 DIAGNOSIS — N390 Urinary tract infection, site not specified: Secondary | ICD-10-CM | POA: Diagnosis not present

## 2019-05-22 DIAGNOSIS — J95822 Acute and chronic postprocedural respiratory failure: Secondary | ICD-10-CM | POA: Diagnosis not present

## 2019-05-22 DIAGNOSIS — R509 Fever, unspecified: Secondary | ICD-10-CM | POA: Diagnosis not present

## 2019-05-22 DIAGNOSIS — J9611 Chronic respiratory failure with hypoxia: Secondary | ICD-10-CM | POA: Diagnosis not present

## 2019-05-23 DIAGNOSIS — F331 Major depressive disorder, recurrent, moderate: Secondary | ICD-10-CM | POA: Diagnosis not present

## 2019-05-23 NOTE — Telephone Encounter (Signed)
LM w/ nursing supervisor to call me back so we can schedule the COVID test for her PFT.

## 2019-05-24 DIAGNOSIS — G118 Other hereditary ataxias: Secondary | ICD-10-CM | POA: Diagnosis not present

## 2019-05-24 DIAGNOSIS — M6281 Muscle weakness (generalized): Secondary | ICD-10-CM | POA: Diagnosis not present

## 2019-05-27 NOTE — Telephone Encounter (Signed)
Sched COVID test 10/26 @ 11 with nursing supervisor.  Nothing further needed at this time.

## 2019-05-28 DIAGNOSIS — N390 Urinary tract infection, site not specified: Secondary | ICD-10-CM | POA: Diagnosis not present

## 2019-05-28 DIAGNOSIS — M6281 Muscle weakness (generalized): Secondary | ICD-10-CM | POA: Diagnosis not present

## 2019-05-28 DIAGNOSIS — B379 Candidiasis, unspecified: Secondary | ICD-10-CM | POA: Diagnosis not present

## 2019-05-28 DIAGNOSIS — G118 Other hereditary ataxias: Secondary | ICD-10-CM | POA: Diagnosis not present

## 2019-05-30 DIAGNOSIS — F331 Major depressive disorder, recurrent, moderate: Secondary | ICD-10-CM | POA: Diagnosis not present

## 2019-06-03 ENCOUNTER — Inpatient Hospital Stay (HOSPITAL_COMMUNITY)
Admission: RE | Admit: 2019-06-03 | Discharge: 2019-06-03 | Disposition: A | Discharge status: Home / Self Care | Payer: Medicare Other | Source: Ambulatory Visit

## 2019-06-04 ENCOUNTER — Other Ambulatory Visit (HOSPITAL_COMMUNITY)
Admission: RE | Admit: 2019-06-04 | Discharge: 2019-06-04 | Disposition: A | Discharge status: Home / Self Care | Payer: Medicare Other | Source: Ambulatory Visit | Attending: Internal Medicine | Admitting: Internal Medicine

## 2019-06-04 DIAGNOSIS — Z20828 Contact with and (suspected) exposure to other viral communicable diseases: Secondary | ICD-10-CM | POA: Diagnosis not present

## 2019-06-04 DIAGNOSIS — Z01812 Encounter for preprocedural laboratory examination: Secondary | ICD-10-CM | POA: Insufficient documentation

## 2019-06-04 LAB — SARS CORONAVIRUS 2 (TAT 6-24 HRS): SARS Coronavirus 2: NEGATIVE

## 2019-06-04 NOTE — Progress Notes (Signed)
Called and spoke to the nursing supervisor about the patients missed covid appointment yesterday.  Supervior stated she will have to call back

## 2019-06-05 ENCOUNTER — Other Ambulatory Visit: Payer: Self-pay | Admitting: Internal Medicine

## 2019-06-05 DIAGNOSIS — Z1159 Encounter for screening for other viral diseases: Secondary | ICD-10-CM | POA: Diagnosis not present

## 2019-06-05 DIAGNOSIS — R0902 Hypoxemia: Secondary | ICD-10-CM

## 2019-06-06 ENCOUNTER — Other Ambulatory Visit: Payer: Self-pay | Admitting: *Deleted

## 2019-06-06 ENCOUNTER — Other Ambulatory Visit: Payer: Self-pay

## 2019-06-06 ENCOUNTER — Ambulatory Visit (INDEPENDENT_AMBULATORY_CARE_PROVIDER_SITE_OTHER): Payer: Medicare Other | Admitting: Internal Medicine

## 2019-06-06 DIAGNOSIS — R0902 Hypoxemia: Secondary | ICD-10-CM

## 2019-06-06 LAB — PULMONARY FUNCTION TEST
DL/VA % pred: 77 %
DL/VA: 3.17 ml/min/mmHg/L
DLCO unc % pred: 61 %
DLCO unc: 12.12 ml/min/mmHg
FEF 25-75 Post: 1.22 L/sec
FEF 25-75 Pre: 1.14 L/sec
FEF2575-%Change-Post: 7 %
FEF2575-%Pred-Post: 73 %
FEF2575-%Pred-Pre: 68 %
FEV1-%Change-Post: 1 %
FEV1-%Pred-Post: 72 %
FEV1-%Pred-Pre: 71 %
FEV1-Post: 1.58 L
FEV1-Pre: 1.55 L
FEV1FVC-%Change-Post: -1 %
FEV1FVC-%Pred-Pre: 97 %
FEV6-%Change-Post: 4 %
FEV6-%Pred-Post: 79 %
FEV6-%Pred-Pre: 76 %
FEV6-Post: 2.19 L
FEV6-Pre: 2.1 L
FEV6FVC-%Change-Post: 1 %
FEV6FVC-%Pred-Post: 105 %
FEV6FVC-%Pred-Pre: 104 %
FVC-%Change-Post: 2 %
FVC-%Pred-Post: 75 %
FVC-%Pred-Pre: 73 %
FVC-Post: 2.19 L
FVC-Pre: 2.13 L
Post FEV1/FVC ratio: 72 %
Post FEV6/FVC ratio: 100 %
Pre FEV1/FVC ratio: 73 %
Pre FEV6/FVC Ratio: 99 %

## 2019-06-06 NOTE — Progress Notes (Signed)
p 

## 2019-06-06 NOTE — Progress Notes (Signed)
Full PFT performed today. N2 performed in place of Pleth as patient was unable to transfer to booth.   Pt is a resident at [facility] and is here with an aid.The aid states patient is tranferred using a lift.

## 2019-06-06 NOTE — Progress Notes (Signed)
Order placed previously.

## 2019-06-07 DIAGNOSIS — F331 Major depressive disorder, recurrent, moderate: Secondary | ICD-10-CM | POA: Diagnosis not present

## 2019-06-10 DIAGNOSIS — F331 Major depressive disorder, recurrent, moderate: Secondary | ICD-10-CM | POA: Diagnosis not present

## 2019-06-10 DIAGNOSIS — R296 Repeated falls: Secondary | ICD-10-CM | POA: Diagnosis not present

## 2019-06-11 DIAGNOSIS — Z03818 Encounter for observation for suspected exposure to other biological agents ruled out: Secondary | ICD-10-CM | POA: Diagnosis not present

## 2019-06-13 DIAGNOSIS — R54 Age-related physical debility: Secondary | ICD-10-CM | POA: Diagnosis not present

## 2019-06-13 DIAGNOSIS — J9611 Chronic respiratory failure with hypoxia: Secondary | ICD-10-CM | POA: Diagnosis not present

## 2019-06-13 DIAGNOSIS — R270 Ataxia, unspecified: Secondary | ICD-10-CM | POA: Diagnosis not present

## 2019-06-13 DIAGNOSIS — I10 Essential (primary) hypertension: Secondary | ICD-10-CM | POA: Diagnosis not present

## 2019-06-17 DIAGNOSIS — G629 Polyneuropathy, unspecified: Secondary | ICD-10-CM | POA: Diagnosis not present

## 2019-06-17 DIAGNOSIS — M6281 Muscle weakness (generalized): Secondary | ICD-10-CM | POA: Diagnosis not present

## 2019-06-19 DIAGNOSIS — I10 Essential (primary) hypertension: Secondary | ICD-10-CM | POA: Diagnosis not present

## 2019-06-19 DIAGNOSIS — Z03818 Encounter for observation for suspected exposure to other biological agents ruled out: Secondary | ICD-10-CM | POA: Diagnosis not present

## 2019-06-19 DIAGNOSIS — J309 Allergic rhinitis, unspecified: Secondary | ICD-10-CM | POA: Diagnosis not present

## 2019-06-19 DIAGNOSIS — J95822 Acute and chronic postprocedural respiratory failure: Secondary | ICD-10-CM | POA: Diagnosis not present

## 2019-06-19 DIAGNOSIS — F331 Major depressive disorder, recurrent, moderate: Secondary | ICD-10-CM | POA: Diagnosis not present

## 2019-06-20 DIAGNOSIS — F331 Major depressive disorder, recurrent, moderate: Secondary | ICD-10-CM | POA: Diagnosis not present

## 2019-06-20 DIAGNOSIS — E039 Hypothyroidism, unspecified: Secondary | ICD-10-CM | POA: Diagnosis not present

## 2019-06-20 DIAGNOSIS — E875 Hyperkalemia: Secondary | ICD-10-CM | POA: Diagnosis not present

## 2019-06-20 DIAGNOSIS — E785 Hyperlipidemia, unspecified: Secondary | ICD-10-CM | POA: Diagnosis not present

## 2019-06-25 DIAGNOSIS — Z1159 Encounter for screening for other viral diseases: Secondary | ICD-10-CM | POA: Diagnosis not present

## 2019-06-26 DIAGNOSIS — G8929 Other chronic pain: Secondary | ICD-10-CM | POA: Diagnosis not present

## 2019-07-02 DIAGNOSIS — Z03818 Encounter for observation for suspected exposure to other biological agents ruled out: Secondary | ICD-10-CM | POA: Diagnosis not present

## 2019-07-03 DIAGNOSIS — R319 Hematuria, unspecified: Secondary | ICD-10-CM | POA: Diagnosis not present

## 2019-07-03 DIAGNOSIS — Z79899 Other long term (current) drug therapy: Secondary | ICD-10-CM | POA: Diagnosis not present

## 2019-07-03 DIAGNOSIS — N390 Urinary tract infection, site not specified: Secondary | ICD-10-CM | POA: Diagnosis not present

## 2019-07-09 DIAGNOSIS — Z03818 Encounter for observation for suspected exposure to other biological agents ruled out: Secondary | ICD-10-CM | POA: Diagnosis not present

## 2019-07-11 DIAGNOSIS — F331 Major depressive disorder, recurrent, moderate: Secondary | ICD-10-CM | POA: Diagnosis not present

## 2019-07-12 DIAGNOSIS — N390 Urinary tract infection, site not specified: Secondary | ICD-10-CM | POA: Diagnosis not present

## 2019-07-17 DIAGNOSIS — F331 Major depressive disorder, recurrent, moderate: Secondary | ICD-10-CM | POA: Diagnosis not present

## 2019-07-18 DIAGNOSIS — F331 Major depressive disorder, recurrent, moderate: Secondary | ICD-10-CM | POA: Diagnosis not present

## 2019-07-24 DIAGNOSIS — G8929 Other chronic pain: Secondary | ICD-10-CM | POA: Diagnosis not present

## 2019-07-31 DIAGNOSIS — H1031 Unspecified acute conjunctivitis, right eye: Secondary | ICD-10-CM | POA: Diagnosis not present

## 2019-07-31 DIAGNOSIS — Z1159 Encounter for screening for other viral diseases: Secondary | ICD-10-CM | POA: Diagnosis not present

## 2019-08-05 DIAGNOSIS — Z03818 Encounter for observation for suspected exposure to other biological agents ruled out: Secondary | ICD-10-CM | POA: Diagnosis not present

## 2019-08-08 DIAGNOSIS — F331 Major depressive disorder, recurrent, moderate: Secondary | ICD-10-CM | POA: Diagnosis not present

## 2019-08-12 DIAGNOSIS — N390 Urinary tract infection, site not specified: Secondary | ICD-10-CM | POA: Diagnosis not present

## 2019-08-12 DIAGNOSIS — R319 Hematuria, unspecified: Secondary | ICD-10-CM | POA: Diagnosis not present

## 2019-08-12 DIAGNOSIS — Z79899 Other long term (current) drug therapy: Secondary | ICD-10-CM | POA: Diagnosis not present

## 2019-08-13 DIAGNOSIS — J9611 Chronic respiratory failure with hypoxia: Secondary | ICD-10-CM | POA: Diagnosis not present

## 2019-08-13 DIAGNOSIS — R54 Age-related physical debility: Secondary | ICD-10-CM | POA: Diagnosis not present

## 2019-08-13 DIAGNOSIS — R270 Ataxia, unspecified: Secondary | ICD-10-CM | POA: Diagnosis not present

## 2019-08-13 DIAGNOSIS — R918 Other nonspecific abnormal finding of lung field: Secondary | ICD-10-CM | POA: Diagnosis not present

## 2019-08-13 DIAGNOSIS — I10 Essential (primary) hypertension: Secondary | ICD-10-CM | POA: Diagnosis not present

## 2019-08-14 DIAGNOSIS — R319 Hematuria, unspecified: Secondary | ICD-10-CM | POA: Diagnosis not present

## 2019-08-14 DIAGNOSIS — U071 COVID-19: Secondary | ICD-10-CM | POA: Diagnosis not present

## 2019-08-14 DIAGNOSIS — J9611 Chronic respiratory failure with hypoxia: Secondary | ICD-10-CM | POA: Diagnosis not present

## 2019-08-14 DIAGNOSIS — E875 Hyperkalemia: Secondary | ICD-10-CM | POA: Diagnosis not present

## 2019-08-14 DIAGNOSIS — D649 Anemia, unspecified: Secondary | ICD-10-CM | POA: Diagnosis not present

## 2019-08-15 DIAGNOSIS — D6489 Other specified anemias: Secondary | ICD-10-CM | POA: Diagnosis not present

## 2019-08-15 DIAGNOSIS — I10 Essential (primary) hypertension: Secondary | ICD-10-CM | POA: Diagnosis not present

## 2019-08-16 DIAGNOSIS — U071 COVID-19: Secondary | ICD-10-CM | POA: Diagnosis not present

## 2019-08-16 DIAGNOSIS — N390 Urinary tract infection, site not specified: Secondary | ICD-10-CM | POA: Diagnosis not present

## 2019-08-16 DIAGNOSIS — D689 Coagulation defect, unspecified: Secondary | ICD-10-CM | POA: Diagnosis not present

## 2019-08-16 DIAGNOSIS — E785 Hyperlipidemia, unspecified: Secondary | ICD-10-CM | POA: Diagnosis not present

## 2019-08-16 DIAGNOSIS — I10 Essential (primary) hypertension: Secondary | ICD-10-CM | POA: Diagnosis not present

## 2019-08-16 DIAGNOSIS — E875 Hyperkalemia: Secondary | ICD-10-CM | POA: Diagnosis not present

## 2019-08-19 DIAGNOSIS — I10 Essential (primary) hypertension: Secondary | ICD-10-CM | POA: Diagnosis not present

## 2019-08-19 DIAGNOSIS — U071 COVID-19: Secondary | ICD-10-CM | POA: Diagnosis not present

## 2019-08-19 DIAGNOSIS — D649 Anemia, unspecified: Secondary | ICD-10-CM | POA: Diagnosis not present

## 2019-08-21 DIAGNOSIS — I10 Essential (primary) hypertension: Secondary | ICD-10-CM | POA: Diagnosis not present

## 2019-08-21 DIAGNOSIS — R0902 Hypoxemia: Secondary | ICD-10-CM | POA: Diagnosis not present

## 2019-08-21 DIAGNOSIS — D649 Anemia, unspecified: Secondary | ICD-10-CM | POA: Diagnosis not present

## 2019-08-21 DIAGNOSIS — N390 Urinary tract infection, site not specified: Secondary | ICD-10-CM | POA: Diagnosis not present

## 2019-08-22 DIAGNOSIS — U071 COVID-19: Secondary | ICD-10-CM | POA: Diagnosis not present

## 2019-08-22 DIAGNOSIS — D649 Anemia, unspecified: Secondary | ICD-10-CM | POA: Diagnosis not present

## 2019-08-23 DIAGNOSIS — D649 Anemia, unspecified: Secondary | ICD-10-CM | POA: Diagnosis not present

## 2019-08-23 DIAGNOSIS — U071 COVID-19: Secondary | ICD-10-CM | POA: Diagnosis not present

## 2019-08-28 DIAGNOSIS — F331 Major depressive disorder, recurrent, moderate: Secondary | ICD-10-CM | POA: Diagnosis not present

## 2019-09-02 DIAGNOSIS — U071 COVID-19: Secondary | ICD-10-CM | POA: Diagnosis not present

## 2019-09-02 DIAGNOSIS — G629 Polyneuropathy, unspecified: Secondary | ICD-10-CM | POA: Diagnosis not present

## 2019-09-02 DIAGNOSIS — M6281 Muscle weakness (generalized): Secondary | ICD-10-CM | POA: Diagnosis not present

## 2019-09-02 DIAGNOSIS — R279 Unspecified lack of coordination: Secondary | ICD-10-CM | POA: Diagnosis not present

## 2019-09-09 DIAGNOSIS — G8929 Other chronic pain: Secondary | ICD-10-CM | POA: Diagnosis not present

## 2019-09-09 DIAGNOSIS — F331 Major depressive disorder, recurrent, moderate: Secondary | ICD-10-CM | POA: Diagnosis not present

## 2019-09-09 DIAGNOSIS — R419 Unspecified symptoms and signs involving cognitive functions and awareness: Secondary | ICD-10-CM | POA: Diagnosis not present

## 2019-09-09 DIAGNOSIS — M6281 Muscle weakness (generalized): Secondary | ICD-10-CM | POA: Diagnosis not present

## 2019-09-09 DIAGNOSIS — U071 COVID-19: Secondary | ICD-10-CM | POA: Diagnosis not present

## 2019-09-10 DIAGNOSIS — M6281 Muscle weakness (generalized): Secondary | ICD-10-CM | POA: Diagnosis not present

## 2019-09-10 DIAGNOSIS — U071 COVID-19: Secondary | ICD-10-CM | POA: Diagnosis not present

## 2019-09-13 DIAGNOSIS — U071 COVID-19: Secondary | ICD-10-CM | POA: Diagnosis not present

## 2019-09-13 DIAGNOSIS — M6281 Muscle weakness (generalized): Secondary | ICD-10-CM | POA: Diagnosis not present

## 2019-09-16 DIAGNOSIS — U071 COVID-19: Secondary | ICD-10-CM | POA: Diagnosis not present

## 2019-09-16 DIAGNOSIS — M6281 Muscle weakness (generalized): Secondary | ICD-10-CM | POA: Diagnosis not present

## 2019-09-17 DIAGNOSIS — M6281 Muscle weakness (generalized): Secondary | ICD-10-CM | POA: Diagnosis not present

## 2019-09-17 DIAGNOSIS — U071 COVID-19: Secondary | ICD-10-CM | POA: Diagnosis not present

## 2019-09-18 DIAGNOSIS — G3281 Cerebellar ataxia in diseases classified elsewhere: Secondary | ICD-10-CM | POA: Diagnosis not present

## 2019-09-18 DIAGNOSIS — I10 Essential (primary) hypertension: Secondary | ICD-10-CM | POA: Diagnosis not present

## 2019-09-18 DIAGNOSIS — J309 Allergic rhinitis, unspecified: Secondary | ICD-10-CM | POA: Diagnosis not present

## 2019-09-18 DIAGNOSIS — R54 Age-related physical debility: Secondary | ICD-10-CM | POA: Diagnosis not present

## 2019-09-19 DIAGNOSIS — U071 COVID-19: Secondary | ICD-10-CM | POA: Diagnosis not present

## 2019-09-19 DIAGNOSIS — M6281 Muscle weakness (generalized): Secondary | ICD-10-CM | POA: Diagnosis not present

## 2019-09-23 DIAGNOSIS — U071 COVID-19: Secondary | ICD-10-CM | POA: Diagnosis not present

## 2019-09-23 DIAGNOSIS — F331 Major depressive disorder, recurrent, moderate: Secondary | ICD-10-CM | POA: Diagnosis not present

## 2019-09-23 DIAGNOSIS — M6281 Muscle weakness (generalized): Secondary | ICD-10-CM | POA: Diagnosis not present

## 2019-09-25 DIAGNOSIS — M6281 Muscle weakness (generalized): Secondary | ICD-10-CM | POA: Diagnosis not present

## 2019-09-25 DIAGNOSIS — U071 COVID-19: Secondary | ICD-10-CM | POA: Diagnosis not present

## 2019-09-25 DIAGNOSIS — F331 Major depressive disorder, recurrent, moderate: Secondary | ICD-10-CM | POA: Diagnosis not present

## 2019-10-08 DIAGNOSIS — R319 Hematuria, unspecified: Secondary | ICD-10-CM | POA: Diagnosis not present

## 2019-10-08 DIAGNOSIS — G3281 Cerebellar ataxia in diseases classified elsewhere: Secondary | ICD-10-CM | POA: Diagnosis not present

## 2019-10-08 DIAGNOSIS — M6281 Muscle weakness (generalized): Secondary | ICD-10-CM | POA: Diagnosis not present

## 2019-10-08 DIAGNOSIS — R309 Painful micturition, unspecified: Secondary | ICD-10-CM | POA: Diagnosis not present

## 2019-10-08 DIAGNOSIS — R300 Dysuria: Secondary | ICD-10-CM | POA: Diagnosis not present

## 2019-10-08 DIAGNOSIS — N390 Urinary tract infection, site not specified: Secondary | ICD-10-CM | POA: Diagnosis not present

## 2019-10-08 DIAGNOSIS — Z7409 Other reduced mobility: Secondary | ICD-10-CM | POA: Diagnosis not present

## 2019-10-08 DIAGNOSIS — R3915 Urgency of urination: Secondary | ICD-10-CM | POA: Diagnosis not present

## 2019-10-08 DIAGNOSIS — Z79899 Other long term (current) drug therapy: Secondary | ICD-10-CM | POA: Diagnosis not present

## 2019-10-09 DIAGNOSIS — B3749 Other urogenital candidiasis: Secondary | ICD-10-CM | POA: Diagnosis not present

## 2019-10-11 DIAGNOSIS — N390 Urinary tract infection, site not specified: Secondary | ICD-10-CM | POA: Diagnosis not present

## 2019-10-14 DIAGNOSIS — G3281 Cerebellar ataxia in diseases classified elsewhere: Secondary | ICD-10-CM | POA: Diagnosis not present

## 2019-10-14 DIAGNOSIS — J9611 Chronic respiratory failure with hypoxia: Secondary | ICD-10-CM | POA: Diagnosis not present

## 2019-10-14 DIAGNOSIS — Z7409 Other reduced mobility: Secondary | ICD-10-CM | POA: Diagnosis not present

## 2019-10-14 DIAGNOSIS — M6281 Muscle weakness (generalized): Secondary | ICD-10-CM | POA: Diagnosis not present

## 2019-10-17 DIAGNOSIS — M6281 Muscle weakness (generalized): Secondary | ICD-10-CM | POA: Diagnosis not present

## 2019-10-17 DIAGNOSIS — U071 COVID-19: Secondary | ICD-10-CM | POA: Diagnosis not present

## 2019-10-17 DIAGNOSIS — G629 Polyneuropathy, unspecified: Secondary | ICD-10-CM | POA: Diagnosis not present

## 2019-10-17 DIAGNOSIS — R279 Unspecified lack of coordination: Secondary | ICD-10-CM | POA: Diagnosis not present

## 2019-10-21 DIAGNOSIS — F331 Major depressive disorder, recurrent, moderate: Secondary | ICD-10-CM | POA: Diagnosis not present

## 2019-10-23 DIAGNOSIS — F331 Major depressive disorder, recurrent, moderate: Secondary | ICD-10-CM | POA: Diagnosis not present

## 2019-10-25 DIAGNOSIS — F331 Major depressive disorder, recurrent, moderate: Secondary | ICD-10-CM | POA: Diagnosis not present

## 2019-10-25 DIAGNOSIS — M7742 Metatarsalgia, left foot: Secondary | ICD-10-CM | POA: Diagnosis not present

## 2019-10-25 DIAGNOSIS — L97521 Non-pressure chronic ulcer of other part of left foot limited to breakdown of skin: Secondary | ICD-10-CM | POA: Diagnosis not present

## 2019-10-25 DIAGNOSIS — M2042 Other hammer toe(s) (acquired), left foot: Secondary | ICD-10-CM | POA: Diagnosis not present

## 2019-10-25 DIAGNOSIS — S60512A Abrasion of left hand, initial encounter: Secondary | ICD-10-CM | POA: Diagnosis not present

## 2019-11-04 DIAGNOSIS — F331 Major depressive disorder, recurrent, moderate: Secondary | ICD-10-CM | POA: Diagnosis not present

## 2019-11-13 DIAGNOSIS — G8929 Other chronic pain: Secondary | ICD-10-CM | POA: Diagnosis not present

## 2019-11-18 DIAGNOSIS — F331 Major depressive disorder, recurrent, moderate: Secondary | ICD-10-CM | POA: Diagnosis not present

## 2019-11-18 DIAGNOSIS — G8929 Other chronic pain: Secondary | ICD-10-CM | POA: Diagnosis not present

## 2019-11-18 DIAGNOSIS — M6281 Muscle weakness (generalized): Secondary | ICD-10-CM | POA: Diagnosis not present

## 2019-11-18 DIAGNOSIS — K219 Gastro-esophageal reflux disease without esophagitis: Secondary | ICD-10-CM | POA: Diagnosis not present

## 2019-11-18 DIAGNOSIS — J9611 Chronic respiratory failure with hypoxia: Secondary | ICD-10-CM | POA: Diagnosis not present

## 2019-11-25 DIAGNOSIS — G8929 Other chronic pain: Secondary | ICD-10-CM | POA: Diagnosis not present

## 2019-12-02 DIAGNOSIS — G8929 Other chronic pain: Secondary | ICD-10-CM | POA: Diagnosis not present

## 2019-12-02 DIAGNOSIS — F331 Major depressive disorder, recurrent, moderate: Secondary | ICD-10-CM | POA: Diagnosis not present

## 2019-12-02 DIAGNOSIS — R300 Dysuria: Secondary | ICD-10-CM | POA: Diagnosis not present

## 2019-12-03 DIAGNOSIS — N390 Urinary tract infection, site not specified: Secondary | ICD-10-CM | POA: Diagnosis not present

## 2019-12-03 DIAGNOSIS — R300 Dysuria: Secondary | ICD-10-CM | POA: Diagnosis not present

## 2019-12-03 DIAGNOSIS — R319 Hematuria, unspecified: Secondary | ICD-10-CM | POA: Diagnosis not present

## 2019-12-04 DIAGNOSIS — F331 Major depressive disorder, recurrent, moderate: Secondary | ICD-10-CM | POA: Diagnosis not present

## 2019-12-11 DIAGNOSIS — J9611 Chronic respiratory failure with hypoxia: Secondary | ICD-10-CM | POA: Diagnosis not present

## 2019-12-11 DIAGNOSIS — K219 Gastro-esophageal reflux disease without esophagitis: Secondary | ICD-10-CM | POA: Diagnosis not present

## 2019-12-11 DIAGNOSIS — M6281 Muscle weakness (generalized): Secondary | ICD-10-CM | POA: Diagnosis not present

## 2019-12-11 DIAGNOSIS — G3281 Cerebellar ataxia in diseases classified elsewhere: Secondary | ICD-10-CM | POA: Diagnosis not present

## 2019-12-16 DIAGNOSIS — K219 Gastro-esophageal reflux disease without esophagitis: Secondary | ICD-10-CM | POA: Diagnosis not present

## 2019-12-16 DIAGNOSIS — M6281 Muscle weakness (generalized): Secondary | ICD-10-CM | POA: Diagnosis not present

## 2019-12-16 DIAGNOSIS — J9611 Chronic respiratory failure with hypoxia: Secondary | ICD-10-CM | POA: Diagnosis not present

## 2019-12-16 DIAGNOSIS — G3281 Cerebellar ataxia in diseases classified elsewhere: Secondary | ICD-10-CM | POA: Diagnosis not present

## 2019-12-23 DIAGNOSIS — G894 Chronic pain syndrome: Secondary | ICD-10-CM | POA: Diagnosis not present

## 2019-12-24 IMAGING — DX DG CHEST 2V
2 series · 2 of 2 positions shown · non-contrast
Comparison: 01/20/2017

CLINICAL DATA: Increased lethargy over the past few days. Low
hemoglobin.

EXAM:
CHEST - 2 VIEW

[chest lat]
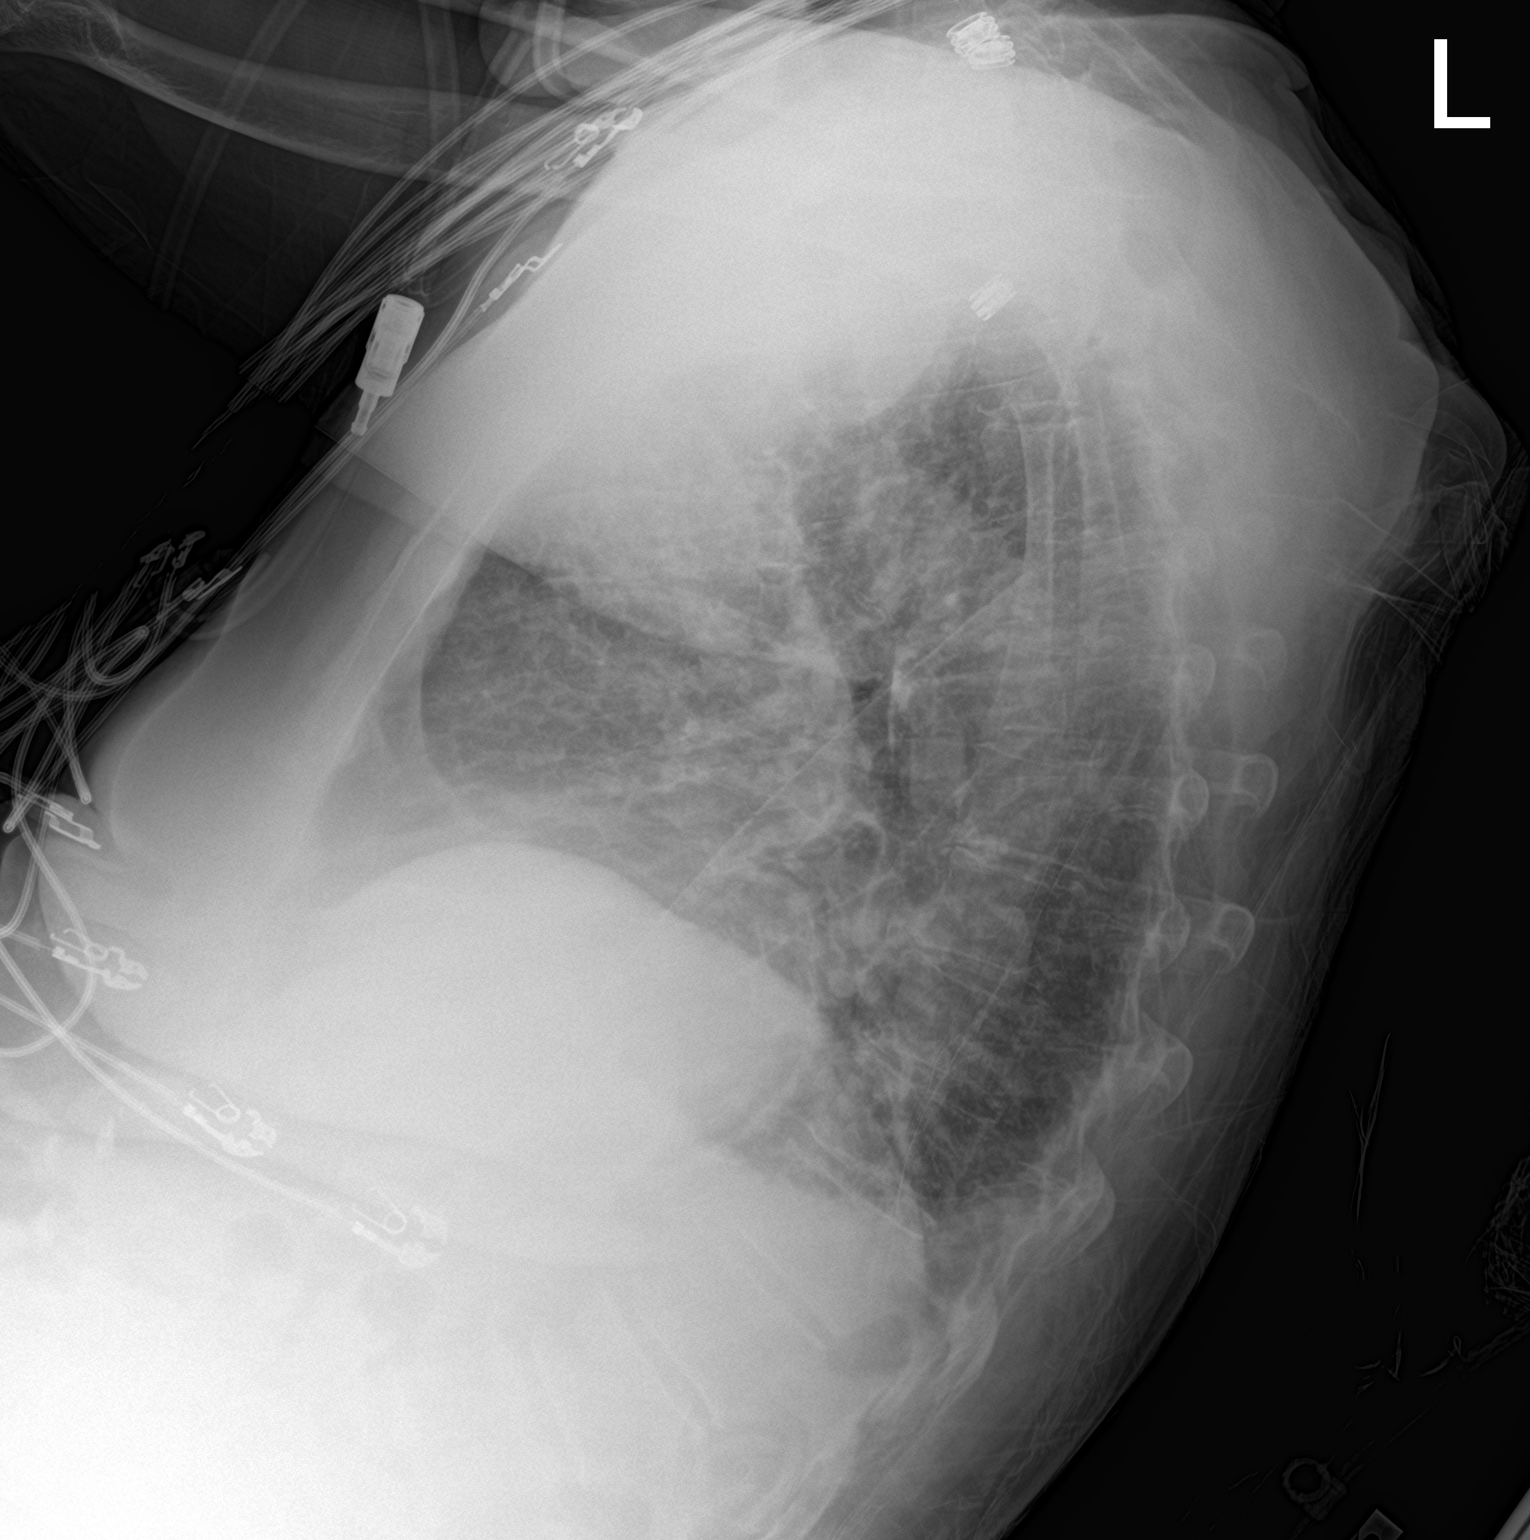

[chest ap]
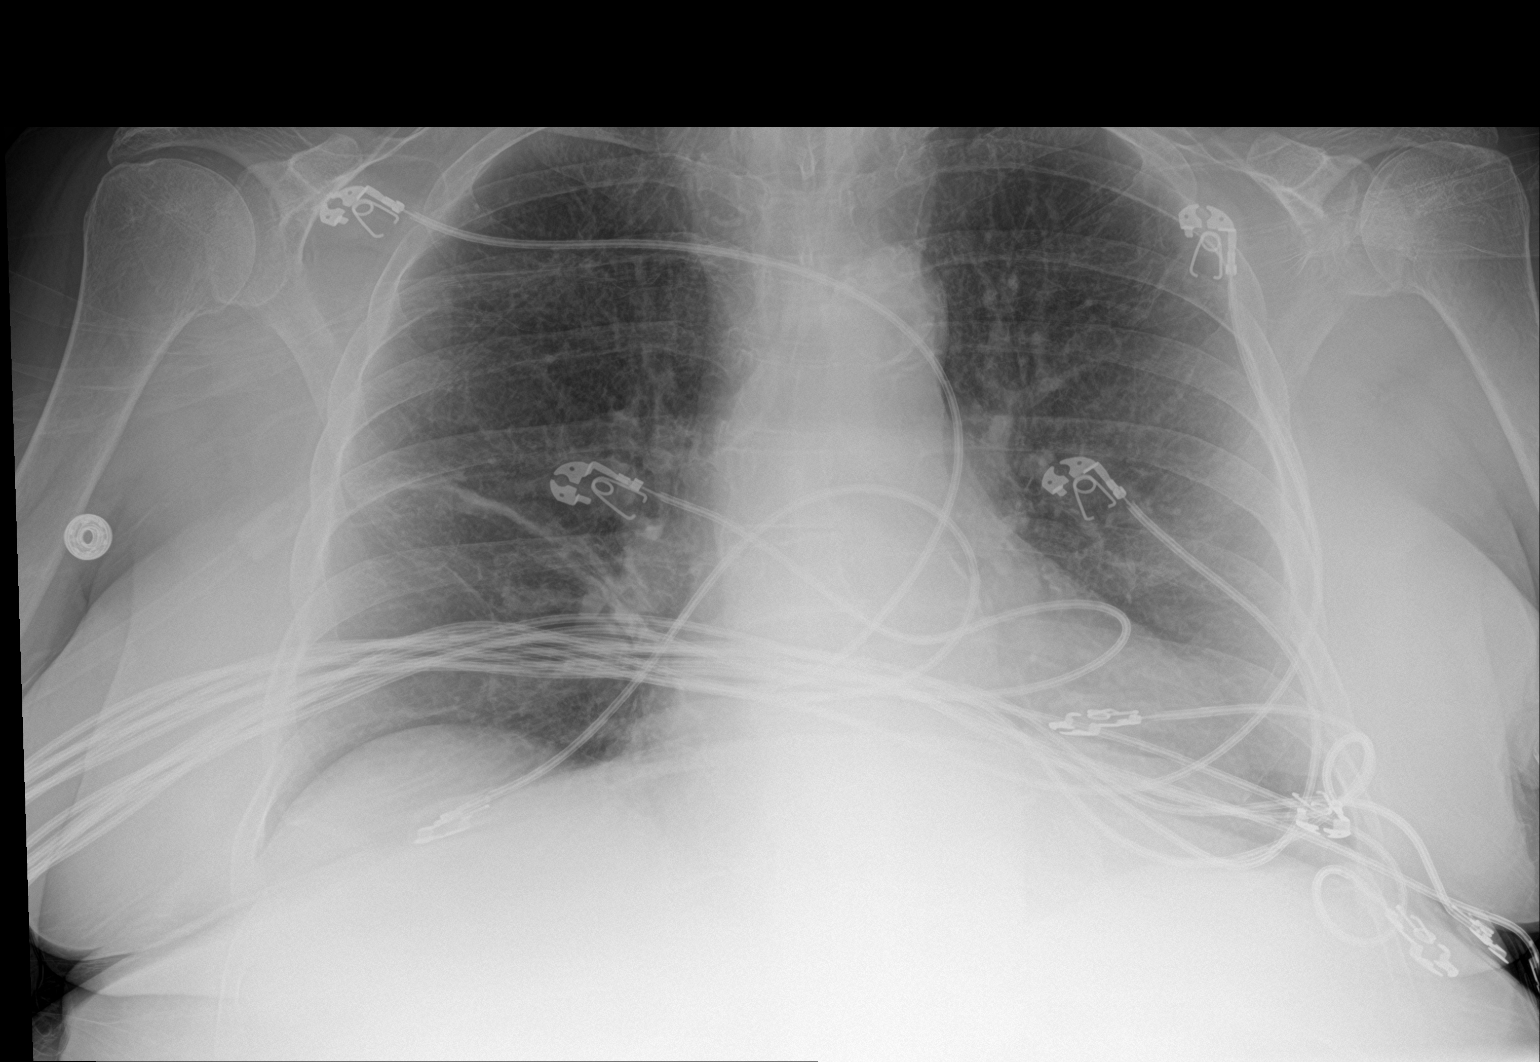

[2 of 2 positions shown; findings below may reference images not displayed]

FINDINGS: Stable heart size. Aortic atherosclerosis at the arch without
aneurysm. Mild prominence of the interstitium, chronic in
appearance. Linear platelike atelectasis or scarring is noted in the
right middle lobe. Trace posterior pleural effusions or pleural
thickening is noted posteriorly.
IMPRESSION: Aortic atherosclerosis. Platelike atelectasis and/or scarring in the
right middle lobe. Chronic mild interstitial prominence.

## 2019-12-25 DIAGNOSIS — E785 Hyperlipidemia, unspecified: Secondary | ICD-10-CM | POA: Diagnosis not present

## 2019-12-25 IMAGING — CT CT ABD-PELV W/O CM
2 of 4 series · 17 of 46 positions shown, 19 images · non-contrast
Comparison: 11/25/2016

CLINICAL DATA: Low hemoglobin

EXAM:
CT ABDOMEN AND PELVIS WITHOUT CONTRAST
TECHNIQUE: Multidetector CT imaging of the abdomen and pelvis was performed
following the standard protocol without IV contrast.

[Series 3: a/p w/o 5mm · axial · non-contrast · 0.84mm/px · z∈[+810,+1230]mm · 14 of 92 slices shown, 16 images]
[im 4/92  soft-tissue]
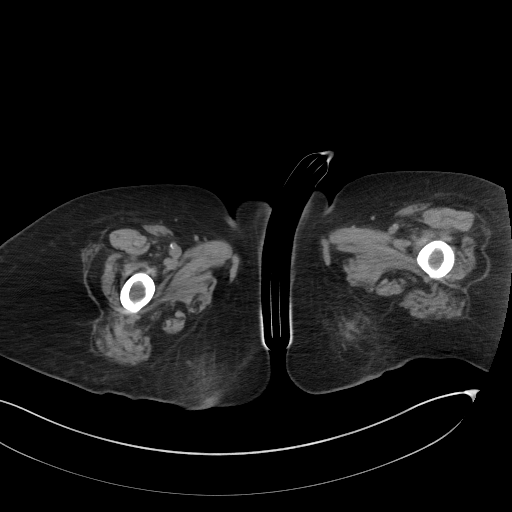
[im 4/92  bone]
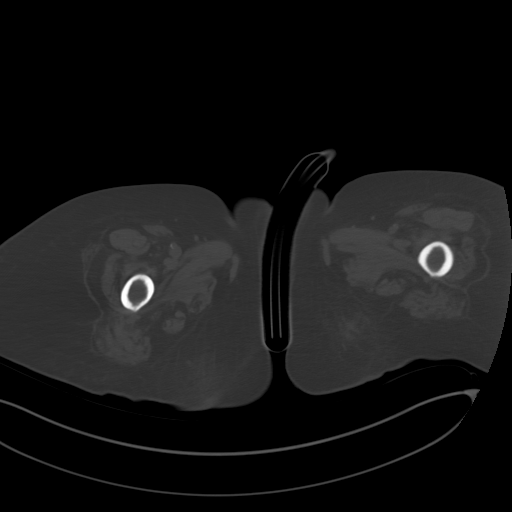
[im 11/92  soft-tissue]
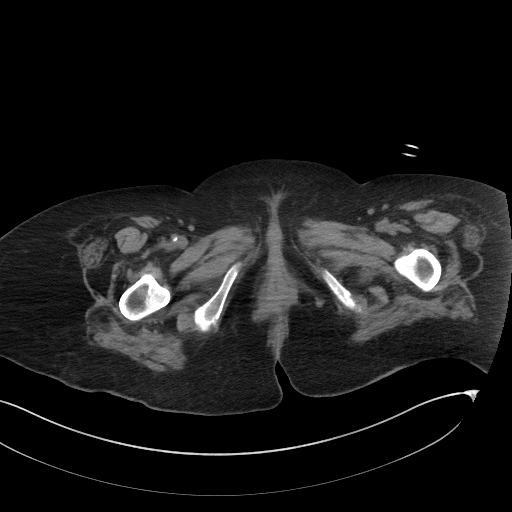
[im 18/92  soft-tissue]
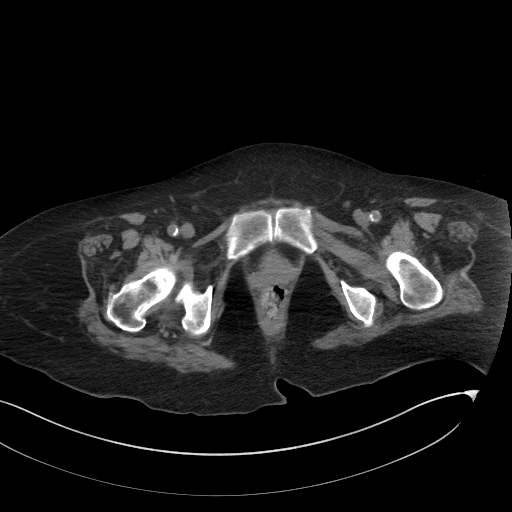
[im 25/92  soft-tissue]
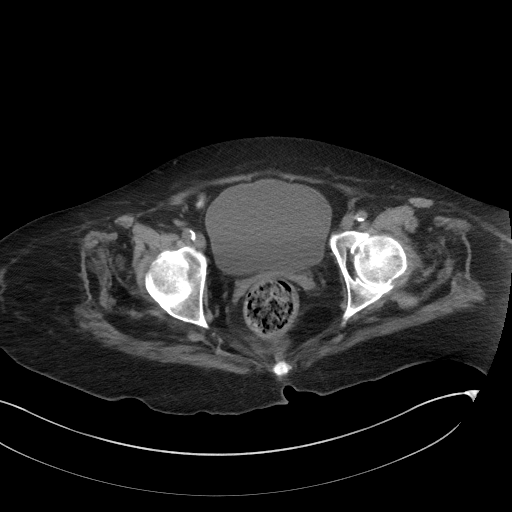
[im 32/92  soft-tissue]
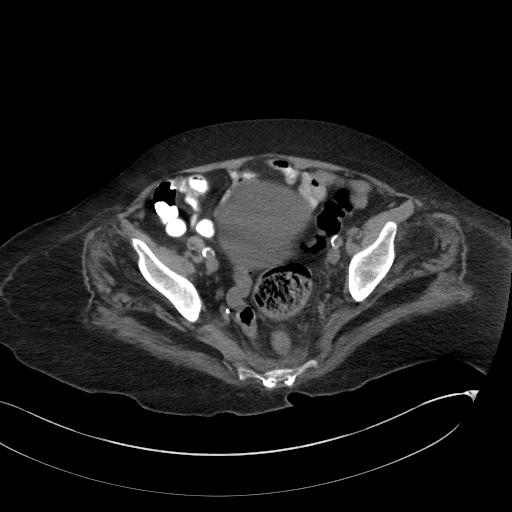
[im 36/92  soft-tissue]
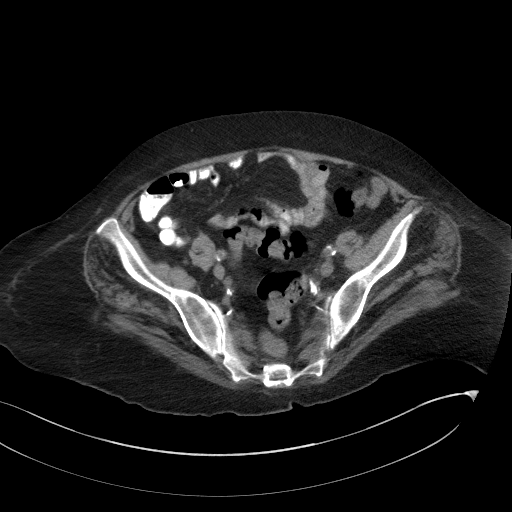
[im 43/92  soft-tissue]
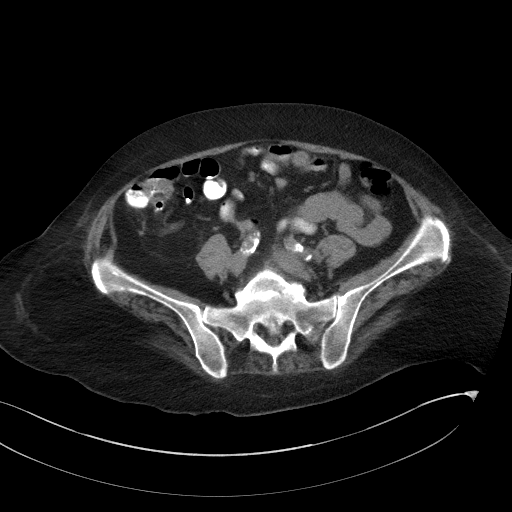
[im 50/92  soft-tissue]
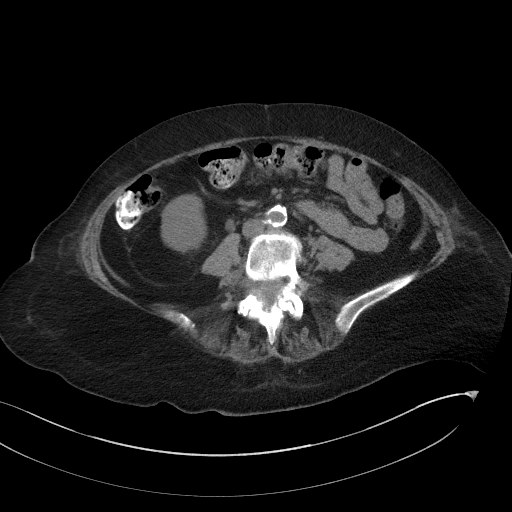
[im 57/92  soft-tissue]
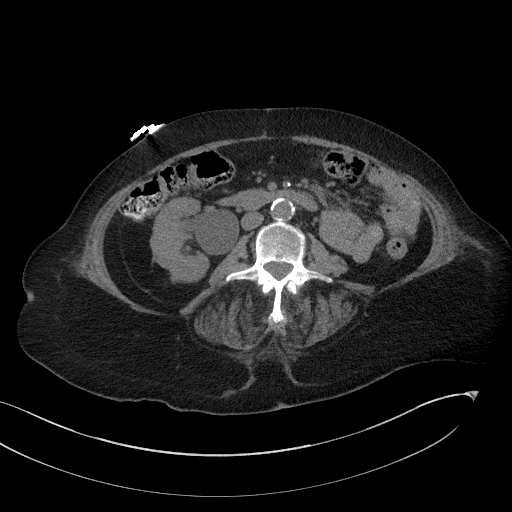
[im 57/92  bone]
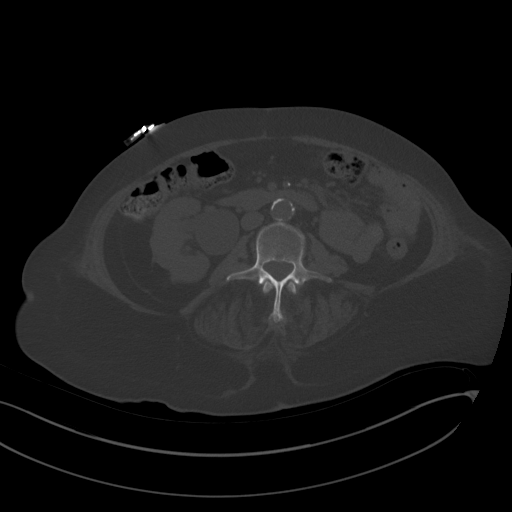
[im 60/92  soft-tissue]
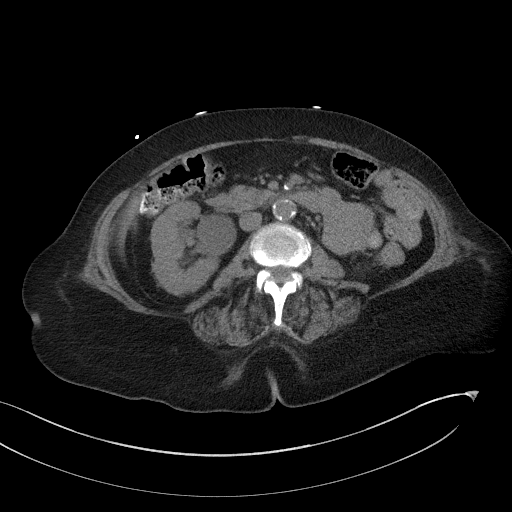
[im 67/92  soft-tissue]
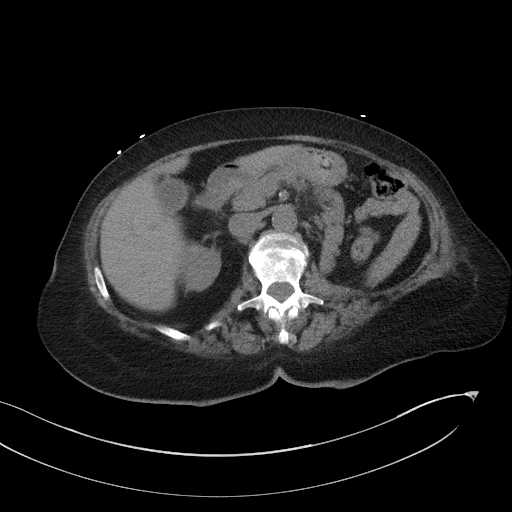
[im 74/92  soft-tissue]
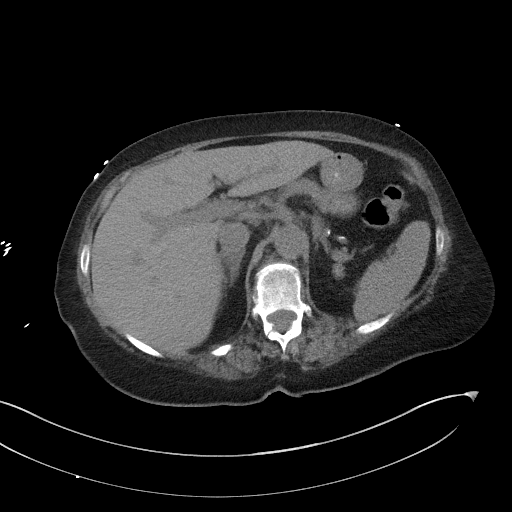
[im 81/92  soft-tissue]
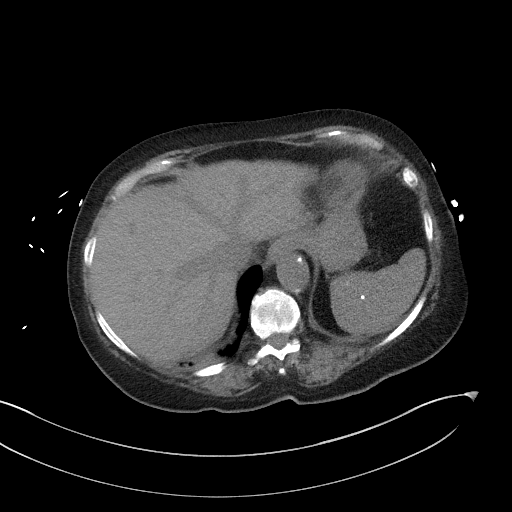
[im 88/92  soft-tissue]
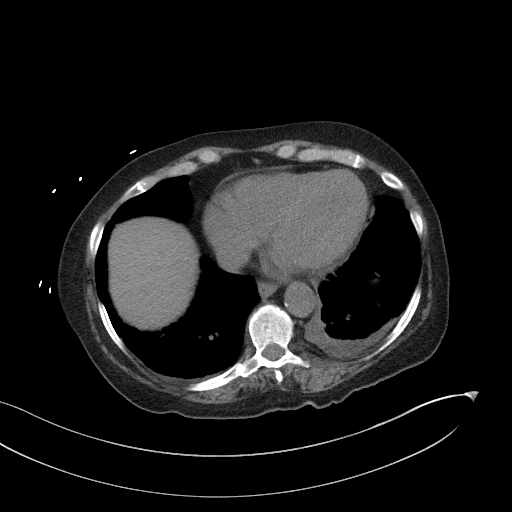

[Series 6: a/p w/o cor · coronal · non-contrast · 0.90mm/px · 3 of 141 slices shown]
[im 63/141  soft-tissue]
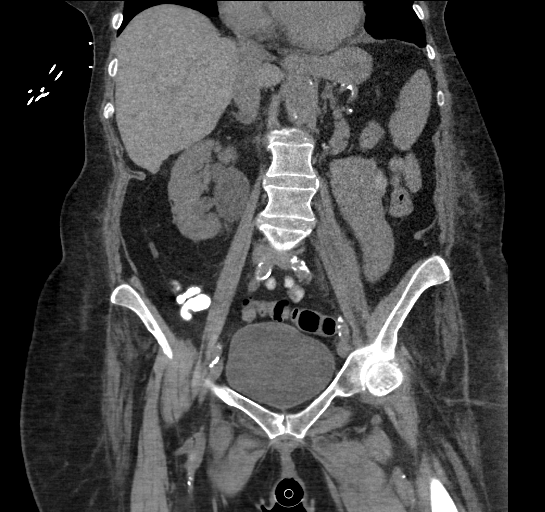
[im 78/141  soft-tissue]
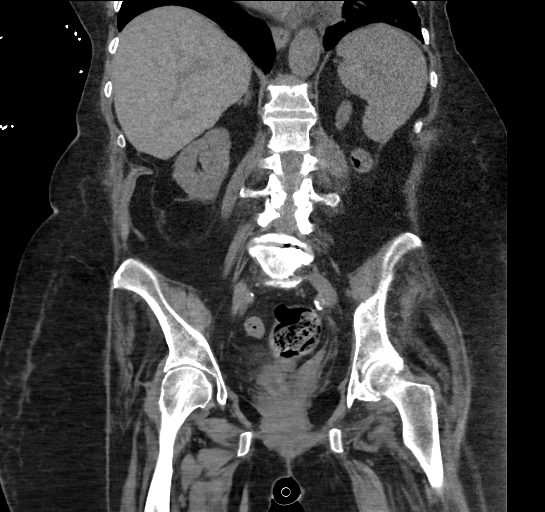
[im 94/141  soft-tissue]
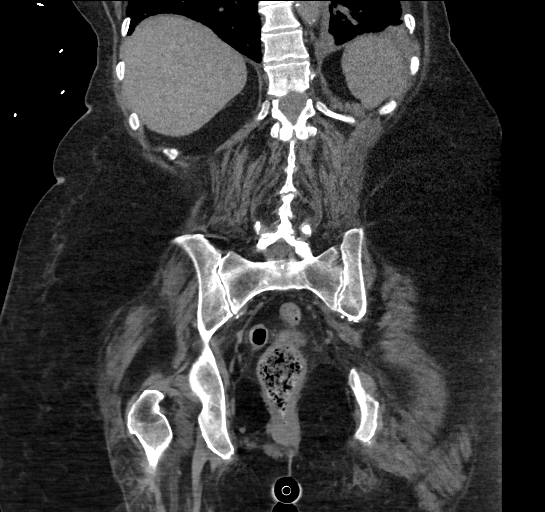

[17 of 46 positions shown; findings below may reference images not displayed]

FINDINGS: Lower chest: Small left pleural effusion is noted. No focal
infiltrate is seen.

Hepatobiliary: Liver demonstrates scattered hypodensities consistent
with small cysts. The gallbladder is within normal limits.

Pancreas: Unremarkable. No pancreatic ductal dilatation or
surrounding inflammatory changes.

Spleen: Scattered calcified granulomas are noted within the spleen.
No other focal abnormality is noted.

Adrenals/Urinary Tract: Right adrenal gland demonstrates a focal
adenoma stable from the prior exam. Left adrenal gland is within
normal limits. The left kidney has been surgically removed. The
right kidney demonstrates hydronephrosis with a relatively normal
appearing right ureter. No calculi are seen. These changes are most
consistent with a ureteropelvic junction obstruction. The overall
appearance is stable from the prior exam. The bladder is well
distended.

Stomach/Bowel: The appendix is within normal limits. No obstructive
changes are noted. Stomach is within normal limits.

Vascular/Lymphatic: Aortic atherosclerosis. No enlarged abdominal or
pelvic lymph nodes.

Reproductive: Status post hysterectomy. No adnexal masses.

Other: No abdominal wall hernia or abnormality. No abdominopelvic
ascites.

Musculoskeletal: Degenerative changes of the lumbar spine are noted.
IMPRESSION: Small left pleural effusion.

Status post left nephrectomy.

Stable right adrenal adenoma.

Stable right UPJ obstructive changes.

## 2019-12-26 DIAGNOSIS — R0902 Hypoxemia: Secondary | ICD-10-CM | POA: Diagnosis not present

## 2019-12-26 DIAGNOSIS — E785 Hyperlipidemia, unspecified: Secondary | ICD-10-CM | POA: Diagnosis not present

## 2019-12-26 DIAGNOSIS — R3915 Urgency of urination: Secondary | ICD-10-CM | POA: Diagnosis not present

## 2019-12-26 DIAGNOSIS — I10 Essential (primary) hypertension: Secondary | ICD-10-CM | POA: Diagnosis not present

## 2019-12-30 DIAGNOSIS — F331 Major depressive disorder, recurrent, moderate: Secondary | ICD-10-CM | POA: Diagnosis not present

## 2020-01-01 DIAGNOSIS — F331 Major depressive disorder, recurrent, moderate: Secondary | ICD-10-CM | POA: Diagnosis not present

## 2020-01-13 DIAGNOSIS — G629 Polyneuropathy, unspecified: Secondary | ICD-10-CM | POA: Diagnosis not present

## 2020-01-13 DIAGNOSIS — I10 Essential (primary) hypertension: Secondary | ICD-10-CM | POA: Diagnosis not present

## 2020-01-13 DIAGNOSIS — G3281 Cerebellar ataxia in diseases classified elsewhere: Secondary | ICD-10-CM | POA: Diagnosis not present

## 2020-01-13 DIAGNOSIS — J309 Allergic rhinitis, unspecified: Secondary | ICD-10-CM | POA: Diagnosis not present

## 2020-01-20 DIAGNOSIS — F331 Major depressive disorder, recurrent, moderate: Secondary | ICD-10-CM | POA: Diagnosis not present

## 2020-01-22 DIAGNOSIS — G894 Chronic pain syndrome: Secondary | ICD-10-CM | POA: Diagnosis not present

## 2020-02-07 DIAGNOSIS — G629 Polyneuropathy, unspecified: Secondary | ICD-10-CM | POA: Diagnosis not present

## 2020-02-07 DIAGNOSIS — G894 Chronic pain syndrome: Secondary | ICD-10-CM | POA: Diagnosis not present

## 2020-02-07 DIAGNOSIS — G3281 Cerebellar ataxia in diseases classified elsewhere: Secondary | ICD-10-CM | POA: Diagnosis not present

## 2020-02-07 DIAGNOSIS — K219 Gastro-esophageal reflux disease without esophagitis: Secondary | ICD-10-CM | POA: Diagnosis not present

## 2020-02-12 DIAGNOSIS — G894 Chronic pain syndrome: Secondary | ICD-10-CM | POA: Diagnosis not present

## 2020-02-12 DIAGNOSIS — B351 Tinea unguium: Secondary | ICD-10-CM | POA: Diagnosis not present

## 2020-02-12 DIAGNOSIS — F331 Major depressive disorder, recurrent, moderate: Secondary | ICD-10-CM | POA: Diagnosis not present

## 2020-02-12 DIAGNOSIS — I739 Peripheral vascular disease, unspecified: Secondary | ICD-10-CM | POA: Diagnosis not present

## 2020-02-13 DIAGNOSIS — R0902 Hypoxemia: Secondary | ICD-10-CM | POA: Diagnosis not present

## 2020-02-13 DIAGNOSIS — I10 Essential (primary) hypertension: Secondary | ICD-10-CM | POA: Diagnosis not present

## 2020-02-13 DIAGNOSIS — R300 Dysuria: Secondary | ICD-10-CM | POA: Diagnosis not present

## 2020-02-13 DIAGNOSIS — R309 Painful micturition, unspecified: Secondary | ICD-10-CM | POA: Diagnosis not present

## 2020-02-17 DIAGNOSIS — G894 Chronic pain syndrome: Secondary | ICD-10-CM | POA: Diagnosis not present

## 2020-02-17 DIAGNOSIS — R319 Hematuria, unspecified: Secondary | ICD-10-CM | POA: Diagnosis not present

## 2020-02-17 DIAGNOSIS — N390 Urinary tract infection, site not specified: Secondary | ICD-10-CM | POA: Diagnosis not present

## 2020-03-02 DIAGNOSIS — F331 Major depressive disorder, recurrent, moderate: Secondary | ICD-10-CM | POA: Diagnosis not present

## 2020-03-09 DIAGNOSIS — K219 Gastro-esophageal reflux disease without esophagitis: Secondary | ICD-10-CM | POA: Diagnosis not present

## 2020-03-09 DIAGNOSIS — J309 Allergic rhinitis, unspecified: Secondary | ICD-10-CM | POA: Diagnosis not present

## 2020-03-09 DIAGNOSIS — G3281 Cerebellar ataxia in diseases classified elsewhere: Secondary | ICD-10-CM | POA: Diagnosis not present

## 2020-03-09 DIAGNOSIS — G894 Chronic pain syndrome: Secondary | ICD-10-CM | POA: Diagnosis not present

## 2020-03-11 DIAGNOSIS — F331 Major depressive disorder, recurrent, moderate: Secondary | ICD-10-CM | POA: Diagnosis not present

## 2020-03-16 DIAGNOSIS — F331 Major depressive disorder, recurrent, moderate: Secondary | ICD-10-CM | POA: Diagnosis not present

## 2020-03-23 DIAGNOSIS — G894 Chronic pain syndrome: Secondary | ICD-10-CM | POA: Diagnosis not present

## 2020-03-27 DIAGNOSIS — I517 Cardiomegaly: Secondary | ICD-10-CM | POA: Diagnosis not present

## 2020-03-27 DIAGNOSIS — R05 Cough: Secondary | ICD-10-CM | POA: Diagnosis not present

## 2020-03-30 DIAGNOSIS — R05 Cough: Secondary | ICD-10-CM | POA: Diagnosis not present

## 2020-04-08 DIAGNOSIS — J309 Allergic rhinitis, unspecified: Secondary | ICD-10-CM | POA: Diagnosis not present

## 2020-04-08 DIAGNOSIS — G894 Chronic pain syndrome: Secondary | ICD-10-CM | POA: Diagnosis not present

## 2020-04-08 DIAGNOSIS — K219 Gastro-esophageal reflux disease without esophagitis: Secondary | ICD-10-CM | POA: Diagnosis not present

## 2020-04-08 DIAGNOSIS — F331 Major depressive disorder, recurrent, moderate: Secondary | ICD-10-CM | POA: Diagnosis not present

## 2020-04-08 DIAGNOSIS — G3281 Cerebellar ataxia in diseases classified elsewhere: Secondary | ICD-10-CM | POA: Diagnosis not present

## 2020-04-09 DIAGNOSIS — F331 Major depressive disorder, recurrent, moderate: Secondary | ICD-10-CM | POA: Diagnosis not present

## 2020-04-14 DIAGNOSIS — N390 Urinary tract infection, site not specified: Secondary | ICD-10-CM | POA: Diagnosis not present

## 2020-04-15 DIAGNOSIS — N390 Urinary tract infection, site not specified: Secondary | ICD-10-CM | POA: Diagnosis not present

## 2020-04-17 DIAGNOSIS — G629 Polyneuropathy, unspecified: Secondary | ICD-10-CM | POA: Diagnosis not present

## 2020-04-17 DIAGNOSIS — D649 Anemia, unspecified: Secondary | ICD-10-CM | POA: Diagnosis not present

## 2020-04-17 DIAGNOSIS — I10 Essential (primary) hypertension: Secondary | ICD-10-CM | POA: Diagnosis not present

## 2020-04-17 DIAGNOSIS — J9611 Chronic respiratory failure with hypoxia: Secondary | ICD-10-CM | POA: Diagnosis not present

## 2020-04-22 DIAGNOSIS — F331 Major depressive disorder, recurrent, moderate: Secondary | ICD-10-CM | POA: Diagnosis not present

## 2020-04-27 DIAGNOSIS — F331 Major depressive disorder, recurrent, moderate: Secondary | ICD-10-CM | POA: Diagnosis not present

## 2020-04-28 DIAGNOSIS — L602 Onychogryphosis: Secondary | ICD-10-CM | POA: Diagnosis not present

## 2020-04-28 DIAGNOSIS — M79672 Pain in left foot: Secondary | ICD-10-CM | POA: Diagnosis not present

## 2020-04-28 DIAGNOSIS — M79671 Pain in right foot: Secondary | ICD-10-CM | POA: Diagnosis not present

## 2020-04-28 DIAGNOSIS — G8911 Acute pain due to trauma: Secondary | ICD-10-CM | POA: Diagnosis not present

## 2020-04-28 DIAGNOSIS — I739 Peripheral vascular disease, unspecified: Secondary | ICD-10-CM | POA: Diagnosis not present

## 2020-05-01 DIAGNOSIS — G629 Polyneuropathy, unspecified: Secondary | ICD-10-CM | POA: Diagnosis not present

## 2020-05-01 DIAGNOSIS — M6281 Muscle weakness (generalized): Secondary | ICD-10-CM | POA: Diagnosis not present

## 2020-05-01 DIAGNOSIS — U071 COVID-19: Secondary | ICD-10-CM | POA: Diagnosis not present

## 2020-05-01 DIAGNOSIS — R279 Unspecified lack of coordination: Secondary | ICD-10-CM | POA: Diagnosis not present

## 2020-05-04 DIAGNOSIS — G894 Chronic pain syndrome: Secondary | ICD-10-CM | POA: Diagnosis not present

## 2020-05-05 DIAGNOSIS — M79621 Pain in right upper arm: Secondary | ICD-10-CM | POA: Diagnosis not present

## 2020-05-06 DIAGNOSIS — U071 COVID-19: Secondary | ICD-10-CM | POA: Diagnosis not present

## 2020-05-06 DIAGNOSIS — G629 Polyneuropathy, unspecified: Secondary | ICD-10-CM | POA: Diagnosis not present

## 2020-05-06 DIAGNOSIS — F331 Major depressive disorder, recurrent, moderate: Secondary | ICD-10-CM | POA: Diagnosis not present

## 2020-05-06 DIAGNOSIS — M6281 Muscle weakness (generalized): Secondary | ICD-10-CM | POA: Diagnosis not present

## 2020-05-06 DIAGNOSIS — R279 Unspecified lack of coordination: Secondary | ICD-10-CM | POA: Diagnosis not present

## 2020-05-13 DIAGNOSIS — R296 Repeated falls: Secondary | ICD-10-CM | POA: Diagnosis not present

## 2020-05-13 DIAGNOSIS — I10 Essential (primary) hypertension: Secondary | ICD-10-CM | POA: Diagnosis not present

## 2020-05-13 DIAGNOSIS — M65221 Calcific tendinitis, right upper arm: Secondary | ICD-10-CM | POA: Diagnosis not present

## 2020-05-13 DIAGNOSIS — D649 Anemia, unspecified: Secondary | ICD-10-CM | POA: Diagnosis not present

## 2020-05-18 DIAGNOSIS — F331 Major depressive disorder, recurrent, moderate: Secondary | ICD-10-CM | POA: Diagnosis not present

## 2020-06-03 DIAGNOSIS — F331 Major depressive disorder, recurrent, moderate: Secondary | ICD-10-CM | POA: Diagnosis not present

## 2020-06-08 DIAGNOSIS — U071 COVID-19: Secondary | ICD-10-CM | POA: Diagnosis not present

## 2020-06-08 DIAGNOSIS — G629 Polyneuropathy, unspecified: Secondary | ICD-10-CM | POA: Diagnosis not present

## 2020-06-08 DIAGNOSIS — M6281 Muscle weakness (generalized): Secondary | ICD-10-CM | POA: Diagnosis not present

## 2020-06-08 DIAGNOSIS — R279 Unspecified lack of coordination: Secondary | ICD-10-CM | POA: Diagnosis not present

## 2020-06-10 DIAGNOSIS — R279 Unspecified lack of coordination: Secondary | ICD-10-CM | POA: Diagnosis not present

## 2020-06-10 DIAGNOSIS — G629 Polyneuropathy, unspecified: Secondary | ICD-10-CM | POA: Diagnosis not present

## 2020-06-10 DIAGNOSIS — M6281 Muscle weakness (generalized): Secondary | ICD-10-CM | POA: Diagnosis not present

## 2020-06-10 DIAGNOSIS — U071 COVID-19: Secondary | ICD-10-CM | POA: Diagnosis not present

## 2020-06-12 DIAGNOSIS — M6281 Muscle weakness (generalized): Secondary | ICD-10-CM | POA: Diagnosis not present

## 2020-06-12 DIAGNOSIS — R296 Repeated falls: Secondary | ICD-10-CM | POA: Diagnosis not present

## 2020-06-12 DIAGNOSIS — G629 Polyneuropathy, unspecified: Secondary | ICD-10-CM | POA: Diagnosis not present

## 2020-06-12 DIAGNOSIS — U071 COVID-19: Secondary | ICD-10-CM | POA: Diagnosis not present

## 2020-06-12 DIAGNOSIS — R279 Unspecified lack of coordination: Secondary | ICD-10-CM | POA: Diagnosis not present

## 2020-06-12 DIAGNOSIS — M65221 Calcific tendinitis, right upper arm: Secondary | ICD-10-CM | POA: Diagnosis not present

## 2020-06-12 DIAGNOSIS — I10 Essential (primary) hypertension: Secondary | ICD-10-CM | POA: Diagnosis not present

## 2020-06-12 DIAGNOSIS — F331 Major depressive disorder, recurrent, moderate: Secondary | ICD-10-CM | POA: Diagnosis not present

## 2020-06-17 DIAGNOSIS — U071 COVID-19: Secondary | ICD-10-CM | POA: Diagnosis not present

## 2020-06-17 DIAGNOSIS — G629 Polyneuropathy, unspecified: Secondary | ICD-10-CM | POA: Diagnosis not present

## 2020-06-17 DIAGNOSIS — M6281 Muscle weakness (generalized): Secondary | ICD-10-CM | POA: Diagnosis not present

## 2020-06-17 DIAGNOSIS — R279 Unspecified lack of coordination: Secondary | ICD-10-CM | POA: Diagnosis not present

## 2020-06-26 DIAGNOSIS — R0789 Other chest pain: Secondary | ICD-10-CM | POA: Diagnosis not present

## 2020-06-27 DIAGNOSIS — R079 Chest pain, unspecified: Secondary | ICD-10-CM | POA: Diagnosis not present

## 2020-06-29 DIAGNOSIS — F331 Major depressive disorder, recurrent, moderate: Secondary | ICD-10-CM | POA: Diagnosis not present

## 2020-06-30 ENCOUNTER — Inpatient Hospital Stay (HOSPITAL_COMMUNITY): Payer: Medicare Other | Admitting: Anesthesiology

## 2020-06-30 ENCOUNTER — Encounter (HOSPITAL_COMMUNITY)
Admission: EM | Disposition: A | Discharge status: Skilled Nursing Facility | Payer: Self-pay | Source: Home / Self Care | Attending: Internal Medicine

## 2020-06-30 ENCOUNTER — Inpatient Hospital Stay (HOSPITAL_COMMUNITY)
Admission: EM | Admit: 2020-06-30 | Discharge: 2020-07-10 | DRG: 480 | Disposition: A | Discharge status: Skilled Nursing Facility | Payer: Medicare Other | Attending: Internal Medicine | Admitting: Internal Medicine

## 2020-06-30 ENCOUNTER — Emergency Department (HOSPITAL_COMMUNITY): Payer: Medicare Other

## 2020-06-30 ENCOUNTER — Inpatient Hospital Stay (HOSPITAL_COMMUNITY): Payer: Medicare Other

## 2020-06-30 ENCOUNTER — Encounter (HOSPITAL_COMMUNITY): Payer: Self-pay

## 2020-06-30 DIAGNOSIS — M84352A Stress fracture, left femur, initial encounter for fracture: Secondary | ICD-10-CM

## 2020-06-30 DIAGNOSIS — G118 Other hereditary ataxias: Secondary | ICD-10-CM | POA: Diagnosis present

## 2020-06-30 DIAGNOSIS — Z961 Presence of intraocular lens: Secondary | ICD-10-CM | POA: Diagnosis present

## 2020-06-30 DIAGNOSIS — D509 Iron deficiency anemia, unspecified: Secondary | ICD-10-CM | POA: Diagnosis not present

## 2020-06-30 DIAGNOSIS — Z7401 Bed confinement status: Secondary | ICD-10-CM | POA: Diagnosis not present

## 2020-06-30 DIAGNOSIS — F32A Depression, unspecified: Secondary | ICD-10-CM | POA: Diagnosis present

## 2020-06-30 DIAGNOSIS — W19XXXA Unspecified fall, initial encounter: Secondary | ICD-10-CM | POA: Diagnosis not present

## 2020-06-30 DIAGNOSIS — K2970 Gastritis, unspecified, without bleeding: Secondary | ICD-10-CM | POA: Diagnosis not present

## 2020-06-30 DIAGNOSIS — S72009A Fracture of unspecified part of neck of unspecified femur, initial encounter for closed fracture: Secondary | ICD-10-CM | POA: Diagnosis present

## 2020-06-30 DIAGNOSIS — S72002A Fracture of unspecified part of neck of left femur, initial encounter for closed fracture: Secondary | ICD-10-CM | POA: Diagnosis not present

## 2020-06-30 DIAGNOSIS — G629 Polyneuropathy, unspecified: Secondary | ICD-10-CM | POA: Diagnosis present

## 2020-06-30 DIAGNOSIS — G500 Trigeminal neuralgia: Secondary | ICD-10-CM | POA: Diagnosis present

## 2020-06-30 DIAGNOSIS — D62 Acute posthemorrhagic anemia: Secondary | ICD-10-CM | POA: Diagnosis not present

## 2020-06-30 DIAGNOSIS — Y92129 Unspecified place in nursing home as the place of occurrence of the external cause: Secondary | ICD-10-CM | POA: Diagnosis not present

## 2020-06-30 DIAGNOSIS — Z91018 Allergy to other foods: Secondary | ICD-10-CM | POA: Diagnosis not present

## 2020-06-30 DIAGNOSIS — K922 Gastrointestinal hemorrhage, unspecified: Secondary | ICD-10-CM | POA: Diagnosis not present

## 2020-06-30 DIAGNOSIS — R4701 Aphasia: Secondary | ICD-10-CM | POA: Diagnosis present

## 2020-06-30 DIAGNOSIS — M25552 Pain in left hip: Secondary | ICD-10-CM | POA: Diagnosis not present

## 2020-06-30 DIAGNOSIS — Z9842 Cataract extraction status, left eye: Secondary | ICD-10-CM | POA: Diagnosis not present

## 2020-06-30 DIAGNOSIS — R001 Bradycardia, unspecified: Secondary | ICD-10-CM | POA: Diagnosis not present

## 2020-06-30 DIAGNOSIS — R011 Cardiac murmur, unspecified: Secondary | ICD-10-CM | POA: Diagnosis present

## 2020-06-30 DIAGNOSIS — Z419 Encounter for procedure for purposes other than remedying health state, unspecified: Secondary | ICD-10-CM

## 2020-06-30 DIAGNOSIS — I10 Essential (primary) hypertension: Secondary | ICD-10-CM | POA: Diagnosis not present

## 2020-06-30 DIAGNOSIS — Z993 Dependence on wheelchair: Secondary | ICD-10-CM

## 2020-06-30 DIAGNOSIS — S72332A Displaced oblique fracture of shaft of left femur, initial encounter for closed fracture: Secondary | ICD-10-CM | POA: Diagnosis not present

## 2020-06-30 DIAGNOSIS — K264 Chronic or unspecified duodenal ulcer with hemorrhage: Secondary | ICD-10-CM | POA: Diagnosis not present

## 2020-06-30 DIAGNOSIS — G319 Degenerative disease of nervous system, unspecified: Secondary | ICD-10-CM | POA: Diagnosis present

## 2020-06-30 DIAGNOSIS — Z90710 Acquired absence of both cervix and uterus: Secondary | ICD-10-CM

## 2020-06-30 DIAGNOSIS — M8580 Other specified disorders of bone density and structure, unspecified site: Secondary | ICD-10-CM | POA: Diagnosis not present

## 2020-06-30 DIAGNOSIS — M170 Bilateral primary osteoarthritis of knee: Secondary | ICD-10-CM | POA: Diagnosis present

## 2020-06-30 DIAGNOSIS — R269 Unspecified abnormalities of gait and mobility: Secondary | ICD-10-CM

## 2020-06-30 DIAGNOSIS — G119 Hereditary ataxia, unspecified: Secondary | ICD-10-CM | POA: Diagnosis not present

## 2020-06-30 DIAGNOSIS — W050XXA Fall from non-moving wheelchair, initial encounter: Secondary | ICD-10-CM | POA: Diagnosis present

## 2020-06-30 DIAGNOSIS — Z79891 Long term (current) use of opiate analgesic: Secondary | ICD-10-CM

## 2020-06-30 DIAGNOSIS — E785 Hyperlipidemia, unspecified: Secondary | ICD-10-CM | POA: Diagnosis present

## 2020-06-30 DIAGNOSIS — R52 Pain, unspecified: Secondary | ICD-10-CM | POA: Diagnosis not present

## 2020-06-30 DIAGNOSIS — N189 Chronic kidney disease, unspecified: Secondary | ICD-10-CM | POA: Diagnosis not present

## 2020-06-30 DIAGNOSIS — Z79899 Other long term (current) drug therapy: Secondary | ICD-10-CM

## 2020-06-30 DIAGNOSIS — K5710 Diverticulosis of small intestine without perforation or abscess without bleeding: Secondary | ICD-10-CM | POA: Diagnosis present

## 2020-06-30 DIAGNOSIS — Z20822 Contact with and (suspected) exposure to covid-19: Secondary | ICD-10-CM | POA: Diagnosis not present

## 2020-06-30 DIAGNOSIS — R1314 Dysphagia, pharyngoesophageal phase: Secondary | ICD-10-CM | POA: Diagnosis present

## 2020-06-30 DIAGNOSIS — Z905 Acquired absence of kidney: Secondary | ICD-10-CM | POA: Diagnosis not present

## 2020-06-30 DIAGNOSIS — R0902 Hypoxemia: Secondary | ICD-10-CM | POA: Diagnosis not present

## 2020-06-30 DIAGNOSIS — Z66 Do not resuscitate: Secondary | ICD-10-CM | POA: Diagnosis present

## 2020-06-30 DIAGNOSIS — Z85528 Personal history of other malignant neoplasm of kidney: Secondary | ICD-10-CM

## 2020-06-30 DIAGNOSIS — S7292XA Unspecified fracture of left femur, initial encounter for closed fracture: Secondary | ICD-10-CM

## 2020-06-30 DIAGNOSIS — M2550 Pain in unspecified joint: Secondary | ICD-10-CM | POA: Diagnosis not present

## 2020-06-30 DIAGNOSIS — Z87891 Personal history of nicotine dependence: Secondary | ICD-10-CM

## 2020-06-30 DIAGNOSIS — I129 Hypertensive chronic kidney disease with stage 1 through stage 4 chronic kidney disease, or unspecified chronic kidney disease: Secondary | ICD-10-CM | POA: Diagnosis not present

## 2020-06-30 DIAGNOSIS — R410 Disorientation, unspecified: Secondary | ICD-10-CM | POA: Diagnosis not present

## 2020-06-30 DIAGNOSIS — E039 Hypothyroidism, unspecified: Secondary | ICD-10-CM | POA: Diagnosis not present

## 2020-06-30 DIAGNOSIS — Z043 Encounter for examination and observation following other accident: Secondary | ICD-10-CM | POA: Diagnosis not present

## 2020-06-30 DIAGNOSIS — D649 Anemia, unspecified: Secondary | ICD-10-CM | POA: Diagnosis not present

## 2020-06-30 DIAGNOSIS — S72332D Displaced oblique fracture of shaft of left femur, subsequent encounter for closed fracture with routine healing: Secondary | ICD-10-CM | POA: Diagnosis not present

## 2020-06-30 DIAGNOSIS — Z8619 Personal history of other infectious and parasitic diseases: Secondary | ICD-10-CM

## 2020-06-30 DIAGNOSIS — K921 Melena: Secondary | ICD-10-CM | POA: Diagnosis not present

## 2020-06-30 DIAGNOSIS — K5900 Constipation, unspecified: Secondary | ICD-10-CM | POA: Diagnosis present

## 2020-06-30 DIAGNOSIS — S7222XA Displaced subtrochanteric fracture of left femur, initial encounter for closed fracture: Secondary | ICD-10-CM | POA: Diagnosis not present

## 2020-06-30 HISTORY — PX: FEMUR IM NAIL: SHX1597

## 2020-06-30 LAB — TYPE AND SCREEN
ABO/RH(D): AB POS
Antibody Screen: NEGATIVE

## 2020-06-30 LAB — CBC WITH DIFFERENTIAL/PLATELET
Abs Immature Granulocytes: 0.08 10*3/uL — ABNORMAL HIGH (ref 0.00–0.07)
Basophils Absolute: 0.1 10*3/uL (ref 0.0–0.1)
Basophils Relative: 0 %
Eosinophils Absolute: 0.1 10*3/uL (ref 0.0–0.5)
Eosinophils Relative: 1 %
HCT: 40.3 % (ref 36.0–46.0)
Hemoglobin: 12.3 g/dL (ref 12.0–15.0)
Immature Granulocytes: 1 %
Lymphocytes Relative: 18 %
Lymphs Abs: 2.2 10*3/uL (ref 0.7–4.0)
MCH: 29.1 pg (ref 26.0–34.0)
MCHC: 30.5 g/dL (ref 30.0–36.0)
MCV: 95.3 fL (ref 80.0–100.0)
Monocytes Absolute: 0.3 10*3/uL (ref 0.1–1.0)
Monocytes Relative: 3 %
Neutro Abs: 9.2 10*3/uL — ABNORMAL HIGH (ref 1.7–7.7)
Neutrophils Relative %: 77 %
Platelets: 267 10*3/uL (ref 150–400)
RBC: 4.23 MIL/uL (ref 3.87–5.11)
RDW: 13.7 % (ref 11.5–15.5)
WBC: 11.9 10*3/uL — ABNORMAL HIGH (ref 4.0–10.5)
nRBC: 0 % (ref 0.0–0.2)

## 2020-06-30 LAB — BASIC METABOLIC PANEL
Anion gap: 9 (ref 5–15)
BUN: 29 mg/dL — ABNORMAL HIGH (ref 8–23)
CO2: 29 mmol/L (ref 22–32)
Calcium: 9 mg/dL (ref 8.9–10.3)
Chloride: 102 mmol/L (ref 98–111)
Creatinine, Ser: 0.84 mg/dL (ref 0.44–1.00)
GFR, Estimated: >60 mL/min (ref >60)
Glucose, Bld: 99 mg/dL (ref 70–99)
Potassium: 4.3 mmol/L (ref 3.5–5.1)
Sodium: 140 mmol/L (ref 135–145)

## 2020-06-30 LAB — RESPIRATORY PANEL BY RT PCR (FLU A&B, COVID)
Influenza A by PCR: NEGATIVE
Influenza B by PCR: NEGATIVE
SARS Coronavirus 2 by RT PCR: NEGATIVE

## 2020-06-30 LAB — PROTIME-INR
INR: 1 (ref 0.8–1.2)
Prothrombin Time: 12.9 seconds (ref 11.4–15.2)

## 2020-06-30 SURGERY — INSERTION, INTRAMEDULLARY ROD, FEMUR
Anesthesia: General | Laterality: Left

## 2020-06-30 MED ORDER — HYDROCODONE-ACETAMINOPHEN 7.5-325 MG PO TABS: 1.0000 | ORAL_TABLET | ORAL | Status: DC | PRN

## 2020-06-30 MED ORDER — PHENYLEPHRINE HCL-NACL 10-0.9 MG/250ML-% IV SOLN
INTRAVENOUS | Status: DC | PRN
  Administered 2020-06-30: 50 ug/min via INTRAVENOUS

## 2020-06-30 MED ORDER — ACETAMINOPHEN 500 MG PO TABS
500.0000 mg | ORAL_TABLET | Freq: Four times a day (QID) | ORAL | Status: DC
  Administered 2020-07-01: 500 mg via ORAL
  Filled 2020-06-30: qty 1

## 2020-06-30 MED ORDER — ONDANSETRON HCL 4 MG/2ML IJ SOLN
4.0000 mg | Freq: Once | INTRAMUSCULAR | Status: AC
  Administered 2020-06-30: 4 mg via INTRAVENOUS
  Filled 2020-06-30: qty 2

## 2020-06-30 MED ORDER — HYDROCODONE-ACETAMINOPHEN 5-325 MG PO TABS: 2.0000 | ORAL_TABLET | Freq: Four times a day (QID) | ORAL | 0 refills | Status: DC | PRN

## 2020-06-30 MED ORDER — HYDROCODONE-ACETAMINOPHEN 5-325 MG PO TABS
1.0000 | ORAL_TABLET | Freq: Four times a day (QID) | ORAL | 0 refills | Status: AC | PRN
Start: 2020-06-30 — End: 2020-07-07

## 2020-06-30 MED ORDER — GLYCOPYRROLATE PF 0.2 MG/ML IJ SOSY
PREFILLED_SYRINGE | INTRAMUSCULAR | Status: DC | PRN
  Administered 2020-06-30: .2 mg via INTRAVENOUS

## 2020-06-30 MED ORDER — LACTATED RINGERS IV SOLN: INTRAVENOUS | Status: DC | PRN

## 2020-06-30 MED ORDER — ACETAMINOPHEN 10 MG/ML IV SOLN
INTRAVENOUS | Status: DC | PRN
  Administered 2020-06-30: 1000 mg via INTRAVENOUS

## 2020-06-30 MED ORDER — SUCCINYLCHOLINE CHLORIDE 20 MG/ML IJ SOLN
INTRAMUSCULAR | Status: DC | PRN
  Administered 2020-06-30: 100 mg via INTRAVENOUS

## 2020-06-30 MED ORDER — FENTANYL CITRATE (PF) 100 MCG/2ML IJ SOLN: 25.0000 ug | INTRAMUSCULAR | Status: DC | PRN

## 2020-06-30 MED ORDER — ACETAMINOPHEN 325 MG PO TABS: 650.0000 mg | ORAL_TABLET | Freq: Four times a day (QID) | ORAL | Status: DC | PRN

## 2020-06-30 MED ORDER — METOCLOPRAMIDE HCL 5 MG/ML IJ SOLN: 5.0000 mg | Freq: Three times a day (TID) | INTRAMUSCULAR | Status: DC | PRN

## 2020-06-30 MED ORDER — DOCUSATE SODIUM 100 MG PO CAPS
100.0000 mg | ORAL_CAPSULE | Freq: Two times a day (BID) | ORAL | Status: DC
  Administered 2020-06-30 – 2020-07-06 (×13): 100 mg via ORAL
  Filled 2020-06-30 (×13): qty 1

## 2020-06-30 MED ORDER — ONDANSETRON HCL 4 MG/2ML IJ SOLN
INTRAMUSCULAR | Status: DC | PRN
  Administered 2020-06-30: 4 mg via INTRAVENOUS

## 2020-06-30 MED ORDER — MORPHINE SULFATE (PF) 2 MG/ML IV SOLN: 0.5000 mg | INTRAVENOUS | Status: DC | PRN

## 2020-06-30 MED ORDER — ENOXAPARIN SODIUM 30 MG/0.3ML ~~LOC~~ SOLN
30.0000 mg | SUBCUTANEOUS | Status: DC
  Administered 2020-07-01 – 2020-07-06 (×5): 30 mg via SUBCUTANEOUS
  Filled 2020-06-30 (×5): qty 0.3

## 2020-06-30 MED ORDER — ACETAMINOPHEN 10 MG/ML IV SOLN
INTRAVENOUS | Status: AC
  Filled 2020-06-30: qty 100

## 2020-06-30 MED ORDER — METOCLOPRAMIDE HCL 5 MG PO TABS: 5.0000 mg | ORAL_TABLET | Freq: Three times a day (TID) | ORAL | Status: DC | PRN

## 2020-06-30 MED ORDER — DEXAMETHASONE SODIUM PHOSPHATE 10 MG/ML IJ SOLN
INTRAMUSCULAR | Status: DC | PRN
  Administered 2020-06-30: 5 mg via INTRAVENOUS

## 2020-06-30 MED ORDER — ONDANSETRON HCL 4 MG/2ML IJ SOLN
4.0000 mg | Freq: Four times a day (QID) | INTRAMUSCULAR | Status: DC | PRN
  Administered 2020-07-04: 4 mg via INTRAVENOUS
  Filled 2020-06-30: qty 2

## 2020-06-30 MED ORDER — BUPIVACAINE HCL (PF) 0.25 % IJ SOLN
INTRAMUSCULAR | Status: AC
  Filled 2020-06-30: qty 30

## 2020-06-30 MED ORDER — 0.9 % SODIUM CHLORIDE (POUR BTL) OPTIME
TOPICAL | Status: DC | PRN
  Administered 2020-06-30: 1000 mL

## 2020-06-30 MED ORDER — EPINEPHRINE PF 1 MG/ML IJ SOLN
INTRAMUSCULAR | Status: AC
  Filled 2020-06-30: qty 1

## 2020-06-30 MED ORDER — MENTHOL 3 MG MT LOZG: 1.0000 | LOZENGE | OROMUCOSAL | Status: DC | PRN

## 2020-06-30 MED ORDER — HYDROCODONE-ACETAMINOPHEN 5-325 MG PO TABS
1.0000 | ORAL_TABLET | ORAL | Status: DC | PRN
  Administered 2020-07-01: 1 via ORAL
  Filled 2020-06-30: qty 1

## 2020-06-30 MED ORDER — VASOPRESSIN 20 UNIT/ML IV SOLN
INTRAVENOUS | Status: AC
  Filled 2020-06-30: qty 1

## 2020-06-30 MED ORDER — CEFAZOLIN SODIUM-DEXTROSE 2-4 GM/100ML-% IV SOLN
2.0000 g | Freq: Four times a day (QID) | INTRAVENOUS | Status: AC
  Administered 2020-07-01 (×2): 2 g via INTRAVENOUS
  Filled 2020-06-30 (×2): qty 100

## 2020-06-30 MED ORDER — ACETAMINOPHEN 650 MG RE SUPP: 650.0000 mg | Freq: Four times a day (QID) | RECTAL | Status: DC | PRN

## 2020-06-30 MED ORDER — HYDROMORPHONE HCL 1 MG/ML IJ SOLN
1.0000 mg | Freq: Once | INTRAMUSCULAR | Status: AC
  Administered 2020-06-30: 1 mg via INTRAVENOUS
  Filled 2020-06-30: qty 1

## 2020-06-30 MED ORDER — LIDOCAINE 2% (20 MG/ML) 5 ML SYRINGE
INTRAMUSCULAR | Status: DC | PRN
  Administered 2020-06-30: 60 mg via INTRAVENOUS

## 2020-06-30 MED ORDER — PROMETHAZINE HCL 25 MG/ML IJ SOLN: 6.2500 mg | INTRAMUSCULAR | Status: DC | PRN

## 2020-06-30 MED ORDER — FENTANYL CITRATE (PF) 100 MCG/2ML IJ SOLN
INTRAMUSCULAR | Status: DC | PRN
  Administered 2020-06-30: 100 ug via INTRAVENOUS

## 2020-06-30 MED ORDER — FENTANYL CITRATE (PF) 250 MCG/5ML IJ SOLN
INTRAMUSCULAR | Status: AC
  Filled 2020-06-30: qty 5

## 2020-06-30 MED ORDER — CEFAZOLIN SODIUM-DEXTROSE 2-3 GM-%(50ML) IV SOLR
INTRAVENOUS | Status: DC | PRN
  Administered 2020-06-30: 2 g via INTRAVENOUS

## 2020-06-30 MED ORDER — SODIUM CHLORIDE 0.9 % IV SOLN: INTRAVENOUS | Status: DC

## 2020-06-30 MED ORDER — PROPOFOL 10 MG/ML IV BOLUS
INTRAVENOUS | Status: AC
  Filled 2020-06-30: qty 40

## 2020-06-30 MED ORDER — HYDROMORPHONE HCL 1 MG/ML IJ SOLN
0.5000 mg | Freq: Once | INTRAMUSCULAR | Status: AC
  Administered 2020-06-30: 0.5 mg via INTRAVENOUS
  Filled 2020-06-30: qty 1

## 2020-06-30 MED ORDER — EPHEDRINE SULFATE-NACL 50-0.9 MG/10ML-% IV SOSY
PREFILLED_SYRINGE | INTRAVENOUS | Status: DC | PRN
  Administered 2020-06-30: 5 mg via INTRAVENOUS

## 2020-06-30 MED ORDER — CEFAZOLIN SODIUM-DEXTROSE 2-4 GM/100ML-% IV SOLN
INTRAVENOUS | Status: AC
  Filled 2020-06-30: qty 100

## 2020-06-30 MED ORDER — ONDANSETRON HCL 4 MG PO TABS: 4.0000 mg | ORAL_TABLET | Freq: Four times a day (QID) | ORAL | Status: DC | PRN

## 2020-06-30 MED ORDER — PHENOL 1.4 % MT LIQD: 1.0000 | OROMUCOSAL | Status: DC | PRN

## 2020-06-30 MED ORDER — PROPOFOL 10 MG/ML IV BOLUS
INTRAVENOUS | Status: DC | PRN
  Administered 2020-06-30: 70 mg via INTRAVENOUS

## 2020-06-30 MED ORDER — ASPIRIN EC 81 MG PO TBEC: 81.0000 mg | DELAYED_RELEASE_TABLET | Freq: Every day | ORAL | 0 refills | Status: DC

## 2020-06-30 MED ORDER — ACETAMINOPHEN 325 MG PO TABS: 325.0000 mg | ORAL_TABLET | Freq: Four times a day (QID) | ORAL | Status: DC | PRN

## 2020-06-30 SURGICAL SUPPLY — 47 items
ALCOHOL 70% 16 OZ (MISCELLANEOUS) ×2 IMPLANT
BIT DRILL SHORT 4.2 (BIT) ×1 IMPLANT
BNDG COHESIVE 4X5 TAN STRL (GAUZE/BANDAGES/DRESSINGS) ×2 IMPLANT
BNDG COHESIVE 6X5 TAN STRL LF (GAUZE/BANDAGES/DRESSINGS) ×2 IMPLANT
COVER PERINEAL POST (MISCELLANEOUS) ×2 IMPLANT
COVER SURGICAL LIGHT HANDLE (MISCELLANEOUS) ×2 IMPLANT
COVER WAND RF STERILE (DRAPES) ×2 IMPLANT
DRAPE C-ARMOR (DRAPES) IMPLANT
DRAPE HALF SHEET 40X57 (DRAPES) IMPLANT
DRAPE INCISE IOBAN 66X45 STRL (DRAPES) IMPLANT
DRAPE ORTHO SPLIT 77X108 STRL (DRAPES)
DRAPE STERI IOBAN 125X83 (DRAPES) ×6 IMPLANT
DRAPE SURG ORHT 6 SPLT 77X108 (DRAPES) IMPLANT
DRILL BIT SHORT 4.2 (BIT) ×1
DRSG ADAPTIC 3X8 NADH LF (GAUZE/BANDAGES/DRESSINGS) ×2 IMPLANT
DRSG TEGADERM 2-3/8X2-3/4 SM (GAUZE/BANDAGES/DRESSINGS) IMPLANT
DRSG TEGADERM 4X4.75 (GAUZE/BANDAGES/DRESSINGS) ×4 IMPLANT
DURAPREP 26ML APPLICATOR (WOUND CARE) ×2 IMPLANT
ELECT REM PT RETURN 9FT ADLT (ELECTROSURGICAL) ×2
ELECTRODE REM PT RTRN 9FT ADLT (ELECTROSURGICAL) ×1 IMPLANT
FACESHIELD WRAPAROUND (MASK) IMPLANT
GAUZE SPONGE 4X4 12PLY STRL (GAUZE/BANDAGES/DRESSINGS) ×2 IMPLANT
GAUZE SPONGE 4X4 12PLY STRL LF (GAUZE/BANDAGES/DRESSINGS) ×2 IMPLANT
GAUZE XEROFORM 1X8 LF (GAUZE/BANDAGES/DRESSINGS) ×2 IMPLANT
GLOVE BIO SURGEON STRL SZ7.5 (GLOVE) ×6 IMPLANT
GLOVE BIOGEL PI IND STRL 8 (GLOVE) ×2 IMPLANT
GLOVE BIOGEL PI INDICATOR 8 (GLOVE) ×2
GOWN STRL REUS W/ TWL LRG LVL3 (GOWN DISPOSABLE) ×2 IMPLANT
GOWN STRL REUS W/ TWL XL LVL3 (GOWN DISPOSABLE) ×1 IMPLANT
GOWN STRL REUS W/TWL LRG LVL3 (GOWN DISPOSABLE) ×2
GOWN STRL REUS W/TWL XL LVL3 (GOWN DISPOSABLE) ×3 IMPLANT
GUIDEWIRE 3.2X400 (WIRE) ×4 IMPLANT
KIT BASIN OR (CUSTOM PROCEDURE TRAY) ×2 IMPLANT
KIT TURNOVER KIT B (KITS) ×2 IMPLANT
MANIFOLD NEPTUNE II (INSTRUMENTS) ×2 IMPLANT
NAIL CANN TFNA 9MM/130 360MM (Nail) ×2 IMPLANT
NS IRRIG 1000ML POUR BTL (IV SOLUTION) ×2 IMPLANT
PACK GENERAL/GYN (CUSTOM PROCEDURE TRAY) ×2 IMPLANT
PAD ARMBOARD 7.5X6 YLW CONV (MISCELLANEOUS) ×4 IMPLANT
REAMER ROD DEEP FLUTE 2.5X950 (INSTRUMENTS) ×2 IMPLANT
SCREW LAG TFNA 90 HIP (Screw) ×2 IMPLANT
SCREW LOCK STAR 5X40 (Screw) ×2 IMPLANT
STAPLER VISISTAT 35W (STAPLE) ×2 IMPLANT
SUT VIC AB 0 CT1 27 (SUTURE) ×1
SUT VIC AB 0 CT1 27XBRD ANBCTR (SUTURE) ×1 IMPLANT
SUT VIC AB 2-0 CT1 27 (SUTURE) ×2
SUT VIC AB 2-0 CT1 TAPERPNT 27 (SUTURE) ×2 IMPLANT

## 2020-06-30 NOTE — Anesthesia Preprocedure Evaluation (Addendum)
Anesthesia Evaluation  Patient identified by MRN, date of birth, ID band Patient awake    Reviewed: Allergy & Precautions, NPO status , Patient's Chart, lab work & pertinent test results  History of Anesthesia Complications Negative for: history of anesthetic complications  Airway Mallampati: II  TM Distance: >3 FB Neck ROM: Full    Dental  (+) Poor Dentition, Dental Advisory Given   Pulmonary neg pulmonary ROS, former smoker,    Pulmonary exam normal        Cardiovascular hypertension, Pt. on home beta blockers Normal cardiovascular exam     Neuro/Psych PSYCHIATRIC DISORDERS Depression Spinocerebellar Ataxia negative neurological ROS     GI/Hepatic negative GI ROS, Neg liver ROS,   Endo/Other  negative endocrine ROS  Renal/GU      Musculoskeletal negative musculoskeletal ROS (+)   Abdominal   Peds  Hematology negative hematology ROS (+)   Anesthesia Other Findings   Reproductive/Obstetrics                            Anesthesia Physical Anesthesia Plan  ASA: III  Anesthesia Plan: General   Post-op Pain Management:    Induction: Intravenous  PONV Risk Score and Plan: 4 or greater and Ondansetron, Dexamethasone and Treatment may vary due to age or medical condition  Airway Management Planned: Oral ETT  Additional Equipment:   Intra-op Plan:   Post-operative Plan: Extubation in OR  Informed Consent: I have reviewed the patients History and Physical, chart, labs and discussed the procedure including the risks, benefits and alternatives for the proposed anesthesia with the patient or authorized representative who has indicated his/her understanding and acceptance.   Patient has DNR.  Discussed DNR with patient and Suspend DNR.   Dental advisory given and Consent reviewed with POA  Plan Discussed with: Anesthesiologist, CRNA and Surgeon  Anesthesia Plan Comments:         Anesthesia Quick Evaluation

## 2020-06-30 NOTE — Anesthesia Postprocedure Evaluation (Signed)
Anesthesia Post Note  Patient: Wendy Erickson  Procedure(s) Performed: INTRAMEDULLARY (IM) NAIL FEMORAL (Left )     Patient location during evaluation: PACU Anesthesia Type: General Level of consciousness: awake Pain management: pain level controlled Vital Signs Assessment: post-procedure vital signs reviewed and stable Respiratory status: spontaneous breathing, nonlabored ventilation and respiratory function stable Cardiovascular status: blood pressure returned to baseline and stable Postop Assessment: no apparent nausea or vomiting Anesthetic complications: no   No complications documented.  Last Vitals:  Vitals:   06/30/20 2000 06/30/20 2015  BP: (!) 96/52 (!) 104/49  Pulse: 87 88  Resp: 15 17  Temp:    SpO2: 99% 97%    Last Pain:  Vitals:   06/30/20 2015  TempSrc:   PainSc: Asleep   Pain Goal:                   Ganesh Deeg A.

## 2020-06-30 NOTE — Brief Op Note (Signed)
06/30/2020  7:11 PM  PATIENT:  Wendy Erickson  79 y.o. female  PRE-OPERATIVE DIAGNOSIS:  Left femur fracture  POST-OPERATIVE DIAGNOSIS:  Same  PROCEDURE:  INTRAMEDULLARY (IM) NAIL FEMORAL  SURGEON:  Nicholes Stairs, MD   Assistant:  Jonelle Sidle, PA-C.  ANESTHESIA:   General  COMPLICATIONS: None

## 2020-06-30 NOTE — OR Nursing (Addendum)
Pt is awake to voice and alert at times and others alert but speech is jumbled- which is baseline for patient due to cerebellar ataxia.Pt and/or family verbalized understanding of poc and discharge instructions. Reviewed admission and on going care with receiving RN. Pt is known to Sports administrator.  Pt is in NAD at this time and is ready to be transferred to floor. Will con't to monitor until pt is transferred. Belongings on bed with patient Pt on 02 3 l/min simple mask Pt on Monitor to floor Pt has + cms, doppler done for left pedal pulse.  Present + cap refill Pt in room following commands, monitor placed on patient. Pt family states pt speech is clear and more alert withmore sleep but other times pt speech is jumbled due to apshasia and may/maynot follow commands. At this time pt is in NAD before nurse left room

## 2020-06-30 NOTE — ED Notes (Signed)
Donalda Ewings PA at bedside.

## 2020-06-30 NOTE — Transfer of Care (Signed)
Immediate Anesthesia Transfer of Care Note  Patient: Wendy Erickson  Procedure(s) Performed: INTRAMEDULLARY (IM) NAIL FEMORAL (Left )  Patient Location: PACU  Anesthesia Type:General  Level of Consciousness: sedated and responds to stimulation  Airway & Oxygen Therapy: Patient connected to face mask oxygen  Post-op Assessment: Report given to RN and Post -op Vital signs reviewed and stable  Post vital signs: Reviewed and stable  Last Vitals:  Vitals Value Taken Time  BP 133/67 06/30/20 1937  Temp    Pulse 93 06/30/20 1940  Resp 23 06/30/20 1940  SpO2 97 % 06/30/20 1940  Vitals shown include unvalidated device data.  Last Pain:  Vitals:   06/30/20 1703  TempSrc:   PainSc: 10-Worst pain ever         Complications: No complications documented.

## 2020-06-30 NOTE — ED Provider Notes (Signed)
[facility] EMERGENCY DEPARTMENT Provider Note   CSN: [id number] Arrival date & time: 06/30/20  1207     History No chief complaint on file.   Wendy Erickson is a 79 y.o. female.  HPI Patient presents from [facility].  Reportedly was transferring from wheelchair to bed found on the floor.  States a lot of pain in her left hip area.  Did not hit her head.  No other injury.  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  Not on anticoagulation.  No abdominal pain.  Has not recently seen orthopedic surgeon.  No chest or abdominal pain.    Past Medical History:  Diagnosis Date  . Abnormality of gait 12/05/2014  . Arthritis    "knees" (05/14/2014)  . 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 Chronic kidney disease    left renal neoplasm  . Depression   . Dysphagia, pharyngoesophageal phase 12/05/2014  . Foot fracture, right   . Gait disorder   . Heart murmur   . High cholesterol   . History of gout   . Hypertension   . 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 Osteopenia   . Peripheral neuropathy   . 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 Shingles    Right V1 distribution  . Symptomatic anemia 06/21/2018  . Trigeminal neuralgia of right side of face 06/09/2015   V1 distribution    Patient Active Problem List   Diagnosis Date Noted  . Symptomatic anemia 06/21/2018  . Microcytic hypochromic anemia 06/21/2018  . Fall   . Subarachnoid hemorrhage ([place]) 05/25/2016  . Subdural hematoma ([place]) 05/25/2016  . SAH (subarachnoid hemorrhage) ([place]) 05/25/2016  . Laceration of face-right supraorbital 05/25/2016  . Trigeminal neuralgia of right side of face 06/09/2015  . Abnormality of gait 12/05/2014  . Dysphagia, pharyngoesophageal phase 12/05/2014  . Cholelithiasis 05/14/2014  . Abdominal pain, epigastric 05/14/2014  . Systolic murmur 56/43/3295  . Chest pain 05/11/2014  . Atypical chest pain  05/11/2014  . 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 Hydronephrosis of right kidney 04/13/2013  . Absent kidney, acquired 04/13/2013  . Pyelonephritis 04/11/2013  . AKI (acute kidney injury) ([place]) 04/11/2013  . HTN (hypertension) 04/11/2013    Past Surgical History:  Procedure Laterality Date  . BACK SURGERY    . CATARACT EXTRACTION W/ INTRAOCULAR LENS IMPLANT Left   . DILATION AND CURETTAGE OF UTERUS    . KNEE ARTHROSCOPY Bilateral   . LAPAROSCOPIC NEPHRECTOMY  10/06/2011   Procedure: LAPAROSCOPIC NEPHRECTOMY;  Surgeon: Dutch Gray, MD;  Location: [facility] ORS;  Service: Urology;  Laterality: Left;  LEFT LAPAROSCOPIC RADICAL NEPHRECTOMY   . LUMBAR DISC SURGERY     "herniated disc"  . TONSILLECTOMY    . VAGINAL HYSTERECTOMY       OB History   No obstetric history on file.     Family History  Problem Relation Age of Onset  . Ataxia Father   . Dementia Mother     Social History   Tobacco Use  . Smoking status: Former Smoker    Packs/day: 1.00    Years: 40.00    Pack years: 40.00    Types: Cigarettes    Quit date: 08/08/2005    Years since quitting: 14.9  . Smokeless tobacco: Never Used  Vaping Use  . Vaping Use: Never used  Substance Use Topics  . Alcohol use: No  Comment: wine on Friday  . Drug use: No    Home Medications Prior to Admission medications   Medication Sig Start Date End Date Taking? Authorizing Provider  acetaminophen (TYLENOL) 325 MG tablet Take 650 mg by mouth every 4 (four) hours as needed for moderate pain or fever (max 10 in 24 hours).    [provider]  atorvastatin (LIPITOR) 40 MG tablet Take 40 mg by mouth at bedtime.     [provider]  Calcium Carbonate-Vitamin D (CALCARB 600/D) 600-400 MG-UNIT per tablet Take 1 tablet by mouth daily.     [provider]  cetirizine (ZYRTEC) 10 MG tablet Take 10 mg by mouth at bedtime.     [provider]  Eyelid Cleansers (OCUSOFT EYELID CLEANSING)  PADS Apply 1 application topically 2 (two) times daily.    [provider]  ferrous sulfate 325 (65 FE) MG EC tablet Take 1 tablet (325 mg total) by mouth 2 (two) times daily. Patient taking differently: Take 325 mg by mouth 3 (three) times daily.  06/22/18 06/22/19  Elgergawy, Silver Huguenin, MD  gabapentin (NEURONTIN) 300 MG capsule Take 1 capsule (300 mg total) by mouth 3 (three) times daily. 07/03/18   Kathrynn Ducking, MD  HYDROcodone-acetaminophen (NORCO/VICODIN) 5-325 MG tablet Take 1 tablet by mouth every 8 (eight) hours.    [provider]  hypromellose (GENTEAL SEVERE) 0.3 % GEL ophthalmic ointment Place 1 application into both eyes at bedtime.    [provider]  magnesium hydroxide (MILK OF MAGNESIA) 400 MG/5ML suspension Take 30 mLs by mouth daily as needed for mild constipation.    [provider]  metoprolol tartrate (LOPRESSOR) 50 MG tablet Take 50 mg by mouth 2 (two) times daily.    [provider]  Mouthwashes (BIOTENE DRY MOUTH MT) Use as directed 2 sprays in the mouth or throat 3 (three) times daily.    [provider]  NON FORMULARY Take by mouth daily. Magic Cup.    [provider]  NON FORMULARY Take by mouth 2 (two) times daily. Med pass. 120 cc    [provider]  nystatin (MYCOSTATIN) 100000 UNIT/ML suspension Take 10 mLs by mouth 4 (four) times daily.    [provider]  ondansetron (ZOFRAN) 4 MG tablet Take 4 mg by mouth 4 (four) times daily as needed for nausea or vomiting.    [provider]  pantoprazole (PROTONIX) 20 MG tablet Take 20 mg by mouth daily.    [provider]  polycarbophil (FIBERCON) 625 MG tablet Take 625 mg by mouth 2 (two) times daily.     [provider]  polyethylene glycol (MIRALAX) packet Take 17 g by mouth daily as needed for moderate constipation. 05/14/14   Domenic Polite, MD  senna (SENOKOT) 8.6 MG tablet Take 1 tablet by mouth 2 (two) times  daily.     [provider]  sertraline (ZOLOFT) 100 MG tablet Take 100 mg by mouth daily. Take with 25 mg to equal 125 mg    [provider]  sertraline (ZOLOFT) 25 MG tablet Take 25 mg by mouth daily. Take with 100 mg to equal 125 mg    [provider]    Allergies    Pineapple  Review of Systems   Review of Systems  Constitutional: Negative for appetite change and fever.  HENT: Negative for congestion.   Respiratory: Positive for shortness of breath.   Cardiovascular: Negative for chest pain.  Gastrointestinal: Negative for  abdominal pain.  Genitourinary: Negative for flank pain.  Musculoskeletal:       Left hip pain.  Skin: Negative for wound.  Neurological: Negative for weakness.  Psychiatric/Behavioral: Negative for confusion.    Physical Exam Updated Vital Signs BP (!) 123/56   Pulse (!) 55   Resp 16   SpO2 95%   Physical Exam Vitals and nursing note reviewed.  HENT:     Head: Atraumatic.  Eyes:     Extraocular Movements: Extraocular movements intact.     Pupils: Pupils are equal, round, and reactive to light.  Cardiovascular:     Rate and Rhythm: Regular rhythm.  Pulmonary:     Breath sounds: No wheezing or rhonchi.  Abdominal:     Tenderness: There is no abdominal tenderness.  Musculoskeletal:     Cervical back: Neck supple.     Comments: Moderate tenderness over proximal left femur/hip.  No knee tenderness.  Leg is externally rotated.  Pain with movement with some crepitance.  Sensation grossly intact in feet.  No upper extremity tenderness.  Skin:    General: Skin is warm.     Capillary Refill: Capillary refill takes less than 2 seconds.  Neurological:     General: No focal deficit present.     Mental Status: She is alert.     ED Results / Procedures / Treatments   Labs (all labs ordered are listed, but only abnormal results are displayed) Labs Reviewed  BASIC METABOLIC PANEL - Abnormal; Notable for the following  components:      Result Value   BUN 29 (*)    All other components within normal limits  CBC WITH DIFFERENTIAL/PLATELET - Abnormal; Notable for the following components:   WBC 11.9 (*)    Neutro Abs 9.2 (*)    Abs Immature Granulocytes 0.08 (*)    All other components within normal limits  RESPIRATORY PANEL BY RT PCR (FLU A&B, COVID)  PROTIME-INR  TYPE AND SCREEN    EKG None  Radiology DG Pelvis Portable  Result Date: 06/30/2020 CLINICAL DATA:  Fall, twisting injury, left femur pain EXAM: PORTABLE PELVIS 1-2 VIEWS COMPARISON:  10/29/2014 FINDINGS: There is an oblique fracture through the proximal left femoral shaft. Mild displacement. No subluxation or dislocation. SI joints and hip joints are symmetric. IMPRESSION: Oblique mildly displaced proximal left femoral shaft fracture. Electronically Signed   By: Rolm Baptise M.D.   On: 06/30/2020 12:53   DG Chest Portable 1 View  Result Date: 06/30/2020 CLINICAL DATA:  Fall, femur fracture EXAM: PORTABLE CHEST 1 VIEW COMPARISON:  None. FINDINGS: Scarring in the right mid lung, stable. No confluent opacities. Heart is normal size. No effusions or acute bony abnormality. IMPRESSION: No active disease. Electronically Signed   By: Rolm Baptise M.D.   On: 06/30/2020 13:05    Procedures Procedures (including critical care time)  Medications Ordered in ED Medications  0.9 %  sodium chloride infusion ( Intravenous New Bag/Given 06/30/20 1226)  HYDROmorphone (DILAUDID) injection 0.5 mg (0.5 mg Intravenous Given 06/30/20 1225)  ondansetron (ZOFRAN) injection 4 mg (4 mg Intravenous Given 06/30/20 1223)    ED Course  I have reviewed the triage vital signs and the nursing notes.  Pertinent labs & imaging results that were available during my care of the patient were reviewed by me and considered in my medical decision making (see chart for details).    MDM Rules/Calculators/A&P  Patient with fall.  Left hip/femur  pain.  Has proximal femur fracture.  Seen by orthopedic surgery.  Likely operation tomorrow but not scheduled yet.  Hilbert Odor and Dr. Stann Mainland are involved.  Patient's PCP is a nursing home doctor.  Will discuss with unassigned medicine. No other apparent injury.  Does not appear to need head CT at this time. Final Clinical Impression(s) / ED Diagnoses Final diagnoses:  Closed fracture of left femur, unspecified fracture morphology, unspecified portion of femur, initial encounter [facility])    Rx / [place] Orders ED Discharge Orders    None       Davonna Belling, MD 06/30/20 1603

## 2020-06-30 NOTE — Consult Note (Signed)
Reason for Consult:Left femur fx Referring Physician: Robyn Haber Time called: [phone] Time at bedside: [name] is an 79 y.o. female.  HPI: Wendy Erickson was reaching for a phone and lost her balance and fell, I think out of her WC. She tells me she in nonambulatory. She had left hip/thigh pain and was brought to the ED. X-rays showed a proximal femur fx and orthopedic surgery was consulted. She is very dysarthric or aphasic and cannot speak very well.  Past Medical History:  Diagnosis Date  . Abnormality of gait 12/05/2014  . Arthritis    "knees" (05/14/2014)  . 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 Chronic kidney disease    left renal neoplasm  . Depression   . Dysphagia, pharyngoesophageal phase 12/05/2014  . Foot fracture, right   . Gait disorder   . Heart murmur   . High cholesterol   . History of gout   . Hypertension   . Kidney malignancy ([place])    left renal neoplasm  . Osteopenia   . Peripheral neuropathy   . 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 Shingles    Right V1 distribution  . Symptomatic anemia 06/21/2018  . Trigeminal neuralgia of right side of face 06/09/2015   V1 distribution    Past Surgical History:  Procedure Laterality Date  . BACK SURGERY    . CATARACT EXTRACTION W/ INTRAOCULAR LENS IMPLANT Left   . DILATION AND CURETTAGE OF UTERUS    . KNEE ARTHROSCOPY Bilateral   . LAPAROSCOPIC NEPHRECTOMY  10/06/2011   Procedure: LAPAROSCOPIC NEPHRECTOMY;  Surgeon: Dutch Gray, MD;  Location: [facility];  Service: Urology;  Laterality: Left;  LEFT LAPAROSCOPIC RADICAL NEPHRECTOMY   . LUMBAR DISC SURGERY     "herniated disc"  . TONSILLECTOMY    . VAGINAL HYSTERECTOMY      Family History  Problem Relation Age of Onset  . Ataxia Father   . Dementia Mother     Social History:  reports that she quit smoking about 14 years ago. Her smoking use included cigarettes. She has a 40.00 pack-year smoking history. She has never used  smokeless tobacco. She reports that she does not drink alcohol and does not use drugs.  Allergies:  Allergies  Allergen Reactions  . Pineapple     Medications: I have reviewed the patient's current medications.  Results for orders placed or performed during the hospital encounter of 06/30/20 (from the past 48 hour(s))  Basic metabolic panel     Status: Abnormal   Collection Time: 06/30/20 12:16 PM  Result Value Ref Range   Sodium 140 135 - 145 mmol/L   Potassium 4.3 3.5 - 5.1 mmol/L   Chloride 102 98 - 111 mmol/L   CO2 29 22 - 32 mmol/L   Glucose, Bld 99 70 - 99 mg/dL    Comment: Glucose reference range applies only to samples taken after fasting for at least 8 hours.   BUN 29 (H) 8 - 23 mg/dL   Creatinine, Ser 0.84 0.44 - 1.00 mg/dL   Calcium 9.0 8.9 - 10.3 mg/dL   GFR, Estimated >60 >60 mL/min    Comment: (NOTE) Calculated using the CKD-EPI Creatinine Equation (2021)    Anion gap 9 5 - 15    Comment: Performed at [facility] [address], [place], [place] [address]  CBC WITH DIFFERENTIAL     Status: Abnormal   Collection Time: 06/30/20 12:16 PM  Result Value Ref  Range   WBC 11.9 (H) 4.0 - 10.5 K/uL   RBC 4.23 3.87 - 5.11 MIL/uL   Hemoglobin 12.3 12.0 - 15.0 g/dL   HCT 40.3 36 - 46 %   MCV 95.3 80.0 - 100.0 fL   MCH 29.1 26.0 - 34.0 pg   MCHC 30.5 30.0 - 36.0 g/dL   RDW 13.7 11.5 - 15.5 %   Platelets 267 150 - 400 K/uL   nRBC 0.0 0.0 - 0.2 %   Neutrophils Relative % 77 %   Neutro Abs 9.2 (H) 1.7 - 7.7 K/uL   Lymphocytes Relative 18 %   Lymphs Abs 2.2 0.7 - 4.0 K/uL   Monocytes Relative 3 %   Monocytes Absolute 0.3 0.1 - 1.0 K/uL   Eosinophils Relative 1 %   Eosinophils Absolute 0.1 0.0 - 0.5 K/uL   Basophils Relative 0 %   Basophils Absolute 0.1 0.0 - 0.1 K/uL   Immature Granulocytes 1 %   Abs Immature Granulocytes 0.08 (H) 0.00 - 0.07 K/uL    Comment: Performed at [facility], [address], [place], [place] [address]  Protime-INR     Status:  None   Collection Time: 06/30/20 12:16 PM  Result Value Ref Range   Prothrombin Time 12.9 11.4 - 15.2 seconds   INR 1.0 0.8 - 1.2    Comment: (NOTE) INR goal varies based on device and disease states. Performed at [facility], [place] [address] [place], [place] [address]   Type and screen [place]     Status: None (Preliminary result)   Collection Time: 06/30/20 12:25 PM  Result Value Ref Range   ABO/RH(D) PENDING    Antibody Screen PENDING    Sample Expiration      07/03/2020,2359 Performed at [facility], [place] [address], [place], [place] [address]     DG Pelvis Portable  Result Date: 06/30/2020 CLINICAL DATA:  Fall, twisting injury, left femur pain EXAM: PORTABLE PELVIS 1-2 VIEWS COMPARISON:  10/29/2014 FINDINGS: There is an oblique fracture through the proximal left femoral shaft. Mild displacement. No subluxation or dislocation. SI joints and hip joints are symmetric. IMPRESSION: Oblique mildly displaced proximal left femoral shaft fracture. Electronically Signed   By: Rolm Baptise M.D.   On: 06/30/2020 12:53   DG Chest Portable 1 View  Result Date: 06/30/2020 CLINICAL DATA:  Fall, femur fracture EXAM: PORTABLE CHEST 1 VIEW COMPARISON:  None. FINDINGS: Scarring in the right mid lung, stable. No confluent opacities. Heart is normal size. No effusions or acute bony abnormality. IMPRESSION: No active disease. Electronically Signed   By: Rolm Baptise M.D.   On: 06/30/2020 13:05    Review of Systems  Unable to perform ROS: Other   Blood pressure (!) 123/56, pulse (!) 55, resp. rate 16, SpO2 95 %. Physical Exam Constitutional:      General: She is not in acute distress.    Appearance: She is well-developed. She is not diaphoretic.  HENT:     Head: Normocephalic and atraumatic.  Eyes:     General: No scleral icterus.       Right eye: No discharge.        Left eye: No discharge.     Conjunctiva/sclera: Conjunctivae normal.  Cardiovascular:      Rate and Rhythm: Normal rate and regular rhythm.  Pulmonary:     Effort: Pulmonary effort is normal. No respiratory distress.  Musculoskeletal:     Cervical back: Normal range  of motion.     Comments: LLE No traumatic wounds, ecchymosis, or rash  Mod TTP hip  No knee or ankle effusion  Knee stable to varus/ valgus and anterior/posterior stress  Sens DPN, SPN, TN intact  Motor EHL, ext, flex, evers grossly intact  DP 1+, PT 0, No significant edema  Skin:    General: Skin is warm and dry.  Neurological:     Mental Status: She is alert.  Psychiatric:        Behavior: Behavior normal.     Assessment/Plan: Left femur fx -- Plan IMN today with Dr. Stann Mainland. Please keep NPO. Spinocerebellar ataxia Trigeminal neuralgia    Lisette Abu, PA-C Orthopedic Surgery [phone] 06/30/2020, 1:20 PM

## 2020-06-30 NOTE — Op Note (Addendum)
Date of Surgery: 06/30/2020  INDICATIONS: Ms. Wendy Erickson is a 79 y.o.-year-old female who sustained a left hip fracture. The risks and benefits of the procedure discussed with the patient prior to the procedure and all questions were answered; consent was obtained.  PREOPERATIVE DIAGNOSIS: left subtrochanteric femur fracture, closed.  POSTOPERATIVE DIAGNOSIS: Same   PROCEDURE: Treatment of intertrochanteric, pertrochanteric, subtrochanteric fracture with intramedullary implant. CPT [phone]   SURGEON: Dannielle Karvonen. Stann Mainland, M.D.   Assistant:  Jonelle Sidle, PA-C  Assistant attestation: PA Mcclung was utilized throughout the procedure for positioning the patient, approach to the fracture, insertion of the intramedullary nail as well as maintenance of the reduction and closure of wounds.  ANESTHESIA: general   IV FLUIDS AND URINE: See anesthesia record   ESTIMATED BLOOD LOSS: 100 cc  IMPLANTS:  Synthes TFNA  9 mm x 360 mm 90 compression screw 40 x 5.0 mm distal interlock  DRAINS: None.   COMPLICATIONS: None.   DESCRIPTION OF PROCEDURE: The patient was brought to the operating room and placed supine on the operating table. The patient's leg had been signed prior to the procedure. The patient had the anesthesia placed by the anesthesiologist. The prep verification and incision time-outs were performed to confirm that this was the correct patient, site, side and location. The patient had an SCD on the opposite lower extremity. The patient did receive antibiotics prior to the incision and was re-dosed during the procedure as needed at indicated intervals. The patient was positioned on the fracture table with the table in traction and internal rotation to reduce the hip. The well leg was placed in a scissor position and all bony prominences were well-padded. The patient had the lower extremity prepped and draped in the standard surgical fashion. The incision was made 4 finger breadths superior to the  greater trochanter. A guide pin was inserted into the tip of the greater trochanter under fluoroscopic guidance. An opening reamer was used to gain access to the femoral canal. The nail length was measured and inserted down the femoral canal to its proper depth. The appropriate version of insertion for the lag screw was found under fluoroscopy. A pin was inserted up the femoral neck through the jig. The length of the lag screw was then measured. The lag screw was inserted as near to center-center in the head as possible. The leg was taken out of traction, then the compression screw was used to compress across the fracture. Compression was visualized on serial xrays.   We next turned our attention to the distal interlocking screw.  This was placed with a perfect circle technique.  A small incision was made overlying the lateral thigh at the screw site, and a tonsil was used to disect down to bone.  A drill pass was made through bi-cortical bone and across the nail through both cortices.  This was measured, and the appropriate screw was placed under hand power and found to have good bite.    The wound was copiously irrigated with saline and the subcutaneous layer closed with 2.0 vicryl and the skin was reapproximated with staples. The wounds were cleaned and dried a final time and a sterile dressing was placed. The hip was taken through a range of motion at the end of the case under fluoroscopic imaging to visualize the approach-withdraw phenomenon and confirm implant length in the head. The patient was then awakened from anesthesia and taken to the recovery room in stable condition. All counts were correct at the  end of the case.   POSTOPERATIVE PLAN: The patient will be weight bearing as tolerated and will return in 2 weeks for staple removal and the patient will receive DVT prophylaxis based on other medications, activity level, and risk ratio of bleeding to thrombosis.  She will be on Lovenox in house and  transition to daily 81 mg asa for 6 weeks post op.  She will return to the medicine service.   Geralynn Rile, MD [facility] [phone] 7:14 PM

## 2020-06-30 NOTE — Anesthesia Procedure Notes (Signed)
Procedure Name: Intubation Date/Time: 06/30/2020 5:57 PM Performed by: Cleda Daub, CRNA Pre-anesthesia Checklist: Patient identified, Emergency Drugs available, Suction available and Patient being monitored Patient Re-evaluated:Patient Re-evaluated prior to induction Oxygen Delivery Method: Circle system utilized Preoxygenation: Pre-oxygenation with 100% oxygen Induction Type: IV induction Ventilation: Mask ventilation without difficulty Laryngoscope Size: Miller and 2 Grade View: Grade I Tube type: Oral Tube size: 7.0 mm Number of attempts: 1 Airway Equipment and Method: Stylet and Oral airway Placement Confirmation: ETT inserted through vocal cords under direct vision,  positive ETCO2 and breath sounds checked- equal and bilateral Secured at: 21 cm Tube secured with: Tape Dental Injury: Teeth and Oropharynx as per pre-operative assessment

## 2020-06-30 NOTE — H&P (Addendum)
Date: 06/30/2020               Patient Name:  Wendy Erickson MRN: [id number]  DOB: 1941/07/15 Age / Sex: 79 y.o., female   PCP: Pcp, No         Medical Service: Internal Medicine Teaching Service         Attending Physician: Dr. Rebeca Alert, Raynaldo Opitz, MD    First Contact: Dr. Gaylan Gerold Pager: [phone]  Second Contact: Dr. Mitzi Hansen Pager: [phone]       After Hours (After 5p/  First Contact Pager: [phone]  weekends / holidays): Second Contact Pager: [phone]   Chief Complaint: Left femoral fracture  History of Present Illness:   Wendy Erickson is a 79 year old female with past medical history of hypertension, hyperlipidemia, peripheral neuropathy, left renal neoplasm, cerebellar ataxia and wheelchair-bound, on chronic 3 L of oxygen who presented to the ED after a fall.  Patient is from [place].  She states that she was trying to reach out to grab something and lost her balance and fell.  She denies loss of consciousness or head injury.  No other injury besides hip pain.  Denies chest pain or shortness of breath.  Patient reports feeling comfortable right now as long as she does not move her left leg.  Patient is not on anticoagulation.  In the ED, hip x-ray shows oblique mildly displaced proximal left femoral shaft fracture.  Her lab works are unremarkable.  Orthopedic was consulted and planned IMN today.  Meds:  Current Meds  Medication Sig  . acetaminophen (TYLENOL) 325 MG tablet Take 650 mg by mouth every 6 (six) hours as needed for moderate pain or fever (max 10 in 24 hours).   Marland Kitchen atorvastatin (LIPITOR) 40 MG tablet Take 40 mg by mouth at bedtime.   . Calcium Carbonate-Vitamin D (CALCARB 600/D) 600-400 MG-UNIT per tablet Take 1 tablet by mouth daily.   . Cholecalciferol (VITAMIN D) 50 MCG (2000 UT) tablet Take 2,000 Units by mouth daily.  . Cranberry 500 MG CAPS Take 500 mg by mouth daily.  . famotidine (PEPCID) 20 MG tablet Take 20 mg by mouth daily.   Marland Kitchen gabapentin (NEURONTIN) 300 MG capsule Take 1 capsule (300 mg total) by mouth 3 (three) times daily. (Patient taking differently: Take 300 mg by mouth 4 (four) times daily. )  . HYDROcodone-acetaminophen (NORCO/VICODIN) 5-325 MG tablet Take 1 tablet by mouth every 6 (six) hours.   . hydroxypropyl methylcellulose / hypromellose (ISOPTO TEARS / GONIOVISC) 2.5 % ophthalmic solution Place 1 drop into both eyes 3 (three) times daily as needed for dry eyes.  . magnesium hydroxide (MILK OF MAGNESIA) 400 MG/5ML suspension Take 30 mLs by mouth daily as needed for mild constipation.  . metoprolol tartrate (LOPRESSOR) 25 MG tablet Take 12.5 mg by mouth 2 (two) times daily.  . phenol (CHLORASEPTIC) 1.4 % LIQD Use as directed 1 spray in the mouth or throat every 6 (six) hours as needed for throat irritation / pain.  . polycarbophil (FIBERCON) 625 MG tablet Take 625 mg by mouth 2 (two) times daily.   Marland Kitchen senna (SENOKOT) 8.6 MG tablet Take 1 tablet by mouth 2 (two) times daily.   . sertraline (ZOLOFT) 100 MG tablet Take 150 mg by mouth daily. Take with 25 mg to equal 125 mg     Allergies: Allergies as of 06/30/2020 - Review Complete 06/30/2020  Allergen Reaction Noted  . Pineapple  05/25/2016   Past Medical History:  Diagnosis Date  . Abnormality of gait 12/05/2014  . Arthritis    "knees" (05/14/2014)  . 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 Chronic kidney disease    left renal neoplasm  . Depression   . Dysphagia, pharyngoesophageal phase 12/05/2014  . Foot fracture, right   . Gait disorder   . Heart murmur   . High cholesterol   . History of gout   . Hypertension   . 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 Osteopenia   . Peripheral neuropathy   . 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 Shingles    Right V1 distribution  . Symptomatic anemia 06/21/2018  . Trigeminal neuralgia of right side of face 06/09/2015   V1 distribution    Family History:   Family History  Problem Relation Age of Onset  . Ataxia Father   . Dementia Mother     Social History:  Denies ethanol, cigarette or drug use  Review of Systems: A complete ROS was negative except as per HPI.   Physical Exam: Blood pressure 125/61, pulse (!) 57, resp. rate (!) 22, SpO2 100 %.  Physical Exam Constitutional:      General: She is not in acute distress.    Comments: Alert, awake and oriented x4  HENT:     Head: Normocephalic and atraumatic.  Eyes:     General:        Right eye: No discharge.        Left eye: No discharge.  Cardiovascular:     Rate and Rhythm: Normal rate and regular rhythm.     Heart sounds: Normal heart sounds.  Pulmonary:     Effort: No respiratory distress.  Abdominal:     General: Bowel sounds are normal.     Palpations: Abdomen is soft.     Tenderness: There is no abdominal tenderness.  Musculoskeletal:     Cervical back: Normal range of motion.     Right lower leg: No edema.     Left lower leg: No edema.     Comments: Normal range of motion and strength of upper extremity bilaterally. Left LE: Normal pedis pulse, patient able to move her toes, left foot is warm to touch Right LE: Diminished sensation which is a chronic issue, normal pulse, she can move her right leg without pain  Skin:    General: Skin is warm.  Neurological:     Mental Status: She is alert.     Comments: PERRLA Cranial nerve non deficit Normal strength and range of motion have upper extremity bilaterally  Psychiatric:        Mood and Affect: Mood normal.     EKG: personally reviewed my interpretation is Sinus rhythm LVH Inverted T waves on v2-3 and aVL  CXR: personally reviewed my interpretation is remarkable  Hip x-ray: Oblique mildly displaced proximal left femoral shaft fracture.  Assessment & Plan by Problem: Principal Problem:   Stress fracture, left femur, initial encounter for fracture  Wendy Erickson is a 79 year old female with past  medical history of hypertension, hyperlipidemia, peripheral neuropathy, left renal neoplasm, cerebellar ataxia and wheelchair-bound, on chronic 3 L of oxygen who admitted to the hospital for a left femoral shaft fracture.  Left femur fracture Patient had a left femoral shaft fracture after a mechanical fall.  No loss of consciousness or head injury.  Upper extremities appear normal.  Orthopedic was consulted and planned IMN today.  --N.p.o. --Patient does  not complain of pain right now.  She received 1 dose of 0.5 mg of Dilaudid.  Acetaminophen rectally as needed for pain --No signs of bleeding, hemoglobin stable --Follow-up postop recommendation   Bradycardia Per neurology notes, this is a chronic problem --Currently holding Lopressor, can restart for hypertension after surgery  Hyperlipidemia Will restart Lipitor after surgery  Iron deficiency is anemia Will restart ferrous sulfate after surgery  Neuropathy Depression? Will restart gabapentin and Zoloft after surgery  Diet: N.p.o. IVF: no DVT: SCD CODE: DNR  Dispo: Admit patient to Inpatient with expected length of stay greater than 2 midnights.  Signed: Gaylan Gerold, DO 06/30/2020, 3:57 PM  Pager: [phone] After 5pm on weekdays and 1pm on weekends: On Call pager: [phone]

## 2020-06-30 NOTE — ED Triage Notes (Addendum)
Per [facility]: Pt from [facility]. pt transferring from wheelchair to bbed, found on left side on floor. Pt has "a lot of pain" with any movement of hip. Left leg is shortened and rotated. 100 mcg of fentanyl to get pt transferred to stretcher. EMS palpated crepitus on left hip. EMS states that pt has maintained pedal pulse in left foot.  EMS placed sheet pelvic binder, which helped reduce pain. No blood thinners and no LOC. Arrives in c-collar.  Pt arrives with out of facility DNR form.   20 G IV in R AC 200 ml saline with EMS 94/60 - initial BP  240/132 -  3 L [place] at all time   C-collar removed by Dr. Alvino Chapel.

## 2020-06-30 NOTE — Discharge Instructions (Signed)
Orthopedic discharge instructions:  -Okay for full weightbearing as tolerated to the left lower extremity. You were also okay to immediately begin sitting and transfers.    -Maintain postoperative bandages until your follow-up appointment. If these become saturated you may remove and replace with daily dry dressings. Otherwise, you may begin showering immediately with these bandages in place.  -Apply ice to the left hip and knee liberally for 20 to 30 minutes on each session.  -For mild and moderate pain use Tylenol and Advil around-the-clock. For breakthrough pain use Norco every 6 hours as needed.  -Your follow-up appointment will be with Dr. Stann Mainland at [facility] in 2 weeks for staple removal and wound check.

## 2020-06-30 NOTE — ED Notes (Signed)
Son at bedside.

## 2020-06-30 NOTE — OR Nursing (Signed)
Nurse tried transitioning to a [place] in PACU, pt is chronic 3l/min [place] at Nursing home per notes.  Pt is a mouth breather and sats % at 90-91 on [place] vs Simple mask of 95-100.  Simple mask applied while patient is sleeping at 3l/min.

## 2020-07-01 ENCOUNTER — Other Ambulatory Visit: Payer: Self-pay

## 2020-07-01 ENCOUNTER — Encounter (HOSPITAL_COMMUNITY): Payer: Self-pay | Admitting: Orthopedic Surgery

## 2020-07-01 DIAGNOSIS — D509 Iron deficiency anemia, unspecified: Secondary | ICD-10-CM

## 2020-07-01 DIAGNOSIS — E039 Hypothyroidism, unspecified: Secondary | ICD-10-CM

## 2020-07-01 DIAGNOSIS — S7292XA Unspecified fracture of left femur, initial encounter for closed fracture: Secondary | ICD-10-CM | POA: Diagnosis present

## 2020-07-01 LAB — BASIC METABOLIC PANEL
Anion gap: 12 (ref 5–15)
BUN: 27 mg/dL — ABNORMAL HIGH (ref 8–23)
CO2: 26 mmol/L (ref 22–32)
Calcium: 8.6 mg/dL — ABNORMAL LOW (ref 8.9–10.3)
Chloride: 99 mmol/L (ref 98–111)
Creatinine, Ser: 1 mg/dL (ref 0.44–1.00)
GFR, Estimated: 58 mL/min — ABNORMAL LOW (ref >60)
Glucose, Bld: 156 mg/dL — ABNORMAL HIGH (ref 70–99)
Potassium: 4.8 mmol/L (ref 3.5–5.1)
Sodium: 137 mmol/L (ref 135–145)

## 2020-07-01 LAB — CBC
HCT: 35.1 % — ABNORMAL LOW (ref 36.0–46.0)
Hemoglobin: 10.7 g/dL — ABNORMAL LOW (ref 12.0–15.0)
MCH: 29.1 pg (ref 26.0–34.0)
MCHC: 30.5 g/dL (ref 30.0–36.0)
MCV: 95.4 fL (ref 80.0–100.0)
Platelets: 283 10*3/uL (ref 150–400)
RBC: 3.68 MIL/uL — ABNORMAL LOW (ref 3.87–5.11)
RDW: 13.9 % (ref 11.5–15.5)
WBC: 17 10*3/uL — ABNORMAL HIGH (ref 4.0–10.5)
nRBC: 0 % (ref 0.0–0.2)

## 2020-07-01 MED ORDER — SENNA 8.6 MG PO TABS
1.0000 | ORAL_TABLET | Freq: Every day | ORAL | Status: DC
  Administered 2020-07-01 – 2020-07-06 (×6): 8.6 mg via ORAL
  Filled 2020-07-01 (×6): qty 1

## 2020-07-01 MED ORDER — ATORVASTATIN CALCIUM 40 MG PO TABS
40.0000 mg | ORAL_TABLET | Freq: Every day | ORAL | Status: DC
  Administered 2020-07-01 – 2020-07-10 (×10): 40 mg via ORAL
  Filled 2020-07-01 (×10): qty 1

## 2020-07-01 MED ORDER — OXYCODONE HCL 5 MG PO TABS
10.0000 mg | ORAL_TABLET | ORAL | Status: DC | PRN
  Administered 2020-07-01 – 2020-07-10 (×13): 10 mg via ORAL
  Filled 2020-07-01 (×14): qty 2

## 2020-07-01 MED ORDER — SERTRALINE HCL 50 MG PO TABS
150.0000 mg | ORAL_TABLET | Freq: Every day | ORAL | Status: DC
  Administered 2020-07-01 – 2020-07-10 (×10): 150 mg via ORAL
  Filled 2020-07-01 (×10): qty 1

## 2020-07-01 MED ORDER — FERROUS SULFATE 325 (65 FE) MG PO TABS
325.0000 mg | ORAL_TABLET | Freq: Two times a day (BID) | ORAL | Status: DC
  Administered 2020-07-01 – 2020-07-10 (×20): 325 mg via ORAL
  Filled 2020-07-01 (×20): qty 1

## 2020-07-01 MED ORDER — GABAPENTIN 300 MG PO CAPS
300.0000 mg | ORAL_CAPSULE | Freq: Three times a day (TID) | ORAL | Status: DC
  Administered 2020-07-01 – 2020-07-10 (×28): 300 mg via ORAL
  Filled 2020-07-01 (×27): qty 1

## 2020-07-01 MED ORDER — ACETAMINOPHEN 500 MG PO TABS
1000.0000 mg | ORAL_TABLET | Freq: Three times a day (TID) | ORAL | Status: DC
  Administered 2020-07-01 – 2020-07-10 (×24): 1000 mg via ORAL
  Filled 2020-07-01 (×27): qty 2

## 2020-07-01 NOTE — Progress Notes (Addendum)
Subjective:   Hospital day: 1  Overnight event: No acute event  This morning, patient reports that her left hip is painful. She does not remember having surgery yesterday. She has no further complaints or concerns.  Objective:  Vital signs in last 24 hours: Vitals:   07/01/20 0227 07/01/20 0351 07/01/20 0620 07/01/20 0842  BP: 111/60 (!) 114/28 117/62 (!) 112/52  Pulse: 73 76  75  Resp: 15 16 18    Temp: 98.2 F (36.8 C) 98.6 F (37 C) 98.4 F (36.9 C) 98.3 F (36.8 C)  TempSrc: Oral Oral Oral Oral  SpO2: 97% 94% 99% 95%  Weight:       CBC Latest Ref Rng & Units 07/01/2020 06/30/2020 06/27/2018  WBC 4.0 - 10.5 K/uL 17.0(H) 11.9(H) 15.3(H)  Hemoglobin 12.0 - 15.0 g/dL 10.7(L) 12.3 8.9(L)  Hematocrit 36 - 46 % 35.1(L) 40.3 32.0(L)  Platelets 150 - 400 K/uL 283 267 330   CMP Latest Ref Rng & Units 07/01/2020 06/30/2020 06/27/2018  Glucose 70 - 99 mg/dL 156(H) 99 101(H)  BUN 8 - 23 mg/dL 27(H) 29(H) 18  Creatinine 0.44 - 1.00 mg/dL 1.00 0.84 0.80  Sodium 135 - 145 mmol/L 137 140 137  Potassium 3.5 - 5.1 mmol/L 4.8 4.3 4.2  Chloride 98 - 111 mmol/L 99 102 103  CO2 22 - 32 mmol/L 26 29 25   Calcium 8.9 - 10.3 mg/dL 8.6(L) 9.0 8.8(L)  Total Protein 6.5 - 8.1 g/dL - - -  Total Bilirubin 0.3 - 1.2 mg/dL - - -  Alkaline Phos 38 - 126 U/L - - -  AST 15 - 41 U/L - - -  ALT 0 - 44 U/L - - -     Physical Exam  Physical Exam Constitutional:      Appearance: She is not toxic-appearing.  HENT:     Head: Normocephalic.  Eyes:     General:        Right eye: No discharge.        Left eye: No discharge.  Cardiovascular:     Rate and Rhythm: Normal rate and regular rhythm.     Comments: 3/6 systolic murmur heard best at upper sternal border Pulmonary:     Effort: No respiratory distress.  Abdominal:     General: Bowel sounds are normal.  Musculoskeletal:     Cervical back: Normal range of motion.     Comments: Surgical wound site normal appearance No pain to palpation  of right lower extremity  Skin:    General: Skin is warm.  Neurological:     Mental Status: She is alert.  Psychiatric:        Mood and Affect: Mood normal.     Assessment/Plan: Wendy Erickson is a 79 year old female with past medical history of hypertension, hyperlipidemia, peripheral neuropathy, left renal neoplasm, cerebellar ataxia and wheelchair-bound, on chronic 3 L of oxygen who presented to the hospital for a left femoral shaft fracture, status post ORIF  Principal Problem:   Femur fracture, left (HCC) Active Problems:   HTN (hypertension)   Abnormality of gait   Trigeminal neuralgia of right side of face   Microcytic hypochromic anemia  Left femur fracture Patient underwent ORIF yesterday. Pain management with scheduled Tylenol and oxycodone as needed. --Advance diet --Scheduled Tylenol 1 g 3 times daily --Oxycodone 10 mg every 4 as needed for severe pain and breakthrough pain --IV morphine as needed for severe pain --Bowel regimen with Colace and Senokot --PT today  Bradycardia Per  neurology notes, this is a chronic problem --Currently holding Lopressor for low blood pressure  Hyperlipidemia Restart Lipitor   Iron deficiency is anemia -Hemoglobin trend down from 12.3-10.7 postop. -CBC in a.m. -Restart ferrous sulfate  Neuropathy Depression? Restart gabapentin and Zoloft after surgery  Diet: Regular IVF: no DVT: Lovenox CODE: DNR  Prior to Admission Living Arrangement: Nursing facility Anticipated Discharge Location: Nursing facility Barriers to Discharge: Physical therapy Dispo: Anticipated discharge in approximately 1-2 day(s).   Gaylan Gerold, DO 07/01/2020, 1:31 PM Pager: [phone] After 5pm on weekdays and 1pm on weekends: On Call pager [phone]

## 2020-07-01 NOTE — Plan of Care (Signed)

## 2020-07-01 NOTE — Progress Notes (Signed)
11/23 2330 Pt is stable. Pt switched from simple mask to nasal cannula but Pt didn't tolerate nasal cannula well. Pt is currently on 3L simple mask.

## 2020-07-01 NOTE — Evaluation (Addendum)
Physical Therapy Evaluation Patient Details Name: Wendy Erickson MRN: [id number] DOB: [birth year] Today's Date: 07/01/2020   History of Present Illness  Wendy Erickson is a 79 year old female with past medical history of hypertension, hyperlipidemia, peripheral neuropathy, left renal neoplasm, cerebellar ataxia and wheelchair-bound, on chronic 3 L of oxygen who presented to the hospital for a left femoral shaft fracture s/p IMN on 06/30/20.  Clinical Impression   Pt admitted with above diagnosis. Pt with very limited mobility at baseline due to cerebellar ataxia.  Reports she was a hoyer transfer for OOB, did not stand, and required assist with ADLs.  States she could assist with bed mobility and sitting EOB for "a minute" without support.  Today, she required Mod-max A of 2 for bed mobility and mod A to maintain sitting EOB.  Pt with limited mobility baseline, but will benefit from acute PT services and PT at discharge to advance to baseline. Pt currently with functional limitations due to the deficits listed below (see PT Problem List). Pt will benefit from skilled PT to increase their independence and safety with mobility to allow discharge to the venue listed below.     At arrival pt was sleeping and O2 nasal cannula was out of nose (set on 5 L).  Pt's sats were 65% with good pleth and monitor was alarming. Immediately applied O2, aroused pt, provided repeated cues for pursed lip breathing and sats returned to 90% within 2-3 mins.  Notified RN   Follow Up Recommendations SNF    Equipment Recommendations  Other (comment) (has DME at facility)    Recommendations for Other Services       Precautions / Restrictions Precautions Precautions: Fall Restrictions Weight Bearing Restrictions: Yes LLE Weight Bearing: Weight bearing as tolerated      Mobility  Bed Mobility Overal bed mobility: Needs Assistance Bed Mobility: Rolling;Supine to Sit;Sit to Supine Rolling: Max assist   Supine to  sit: Max assist Sit to supine: Max assist;+2 for safety/equipment;+2 for physical assistance   General bed mobility comments: Required cues for transfer technique and sequencing, use of bed pad to facilitate, assist with L LE and trunk for all transfers    Transfers                 General transfer comment: does not stand baseline  Ambulation/Gait                Stairs            Wheelchair Mobility    Modified Rankin (Stroke Patients Only)       Balance Overall balance assessment: Needs assistance Sitting-balance support: Feet supported;Bilateral upper extremity supported Sitting balance-Leahy Scale: Poor Sitting balance - Comments: Requiring mod A; poor trunk control all directions; sat EOB for 5 mins with support                                     Pertinent Vitals/Pain Pain Assessment: Faces Faces Pain Scale: Hurts little more Pain Location: L hip with movement Pain Descriptors / Indicators: Grimacing Pain Intervention(s): Limited activity within patient's tolerance;Monitored during session;Repositioned    Home Living Family/patient expects to be discharged to:: Skilled nursing facility                      Prior Function Level of Independence: Needs assistance   Gait / Transfers Assistance Needed: Pt is non-ambulatory and reports she  is a hoyer lift to her w/c. Reports she has assistance with w/c use.  States "I used to have a power chair but they took it away".  States that she does not stand and was not working with therapy at [facility]  ADL's / [employer] Assistance Needed: Required assistance - used bed pan and bed baths  Comments: Reports could assist with bed mobiilty and sit EOB for "a minute" without support     Hand Dominance        Extremity/Trunk Assessment   Upper Extremity Assessment Upper Extremity Assessment: LUE deficits/detail;RUE deficits/detail RUE Deficits / Details: ROM WFL, MMT 4/5, severe ataxia -  baseline LUE Deficits / Details: ROM WFL, MMT 4/5, severe ataxia - baseline    Lower Extremity Assessment Lower Extremity Assessment: LLE deficits/detail;RLE deficits/detail RLE Deficits / Details: ROM WFL; MMT 4-/5; ataxia baseline LLE Deficits / Details: ROM: WFL but painfult; MMT ankle 4/5, hip and knee 1/5; ataxia baseline    Cervical / Trunk Assessment Cervical / Trunk Assessment: Kyphotic (poor trunk control in sitting)  Communication   Communication: No difficulties  Cognition Arousal/Alertness: Lethargic Behavior During Therapy: WFL for tasks assessed/performed Overall Cognitive Status: No family/caregiver present to determine baseline cognitive functioning Area of Impairment: Orientation;Attention;Memory;Following commands;Awareness;Problem solving                 Orientation Level: Place;Time;Situation (states "November 2020 at [facility]") Current Attention Level: Sustained Memory: Decreased short-term memory Following Commands: Follows one step commands with increased time   Awareness: Intellectual Problem Solving: Slow processing;Difficulty sequencing;Requires verbal cues;Requires tactile cues        General Comments General comments (skin integrity, edema, etc.): At arrival pt was sleeping and O2 nasal cannula was out of nose (set on 5 L).  Pt's sats were 65% with good pleth and monitor was alarming. Immediately applied O2, aroused pt, provided repeated cues for pursed lip breathing and sats returned to 90% within 2-3 mins.  Notified RN and Chartered certified accountant.  Nurse tech in at end of session to assist with transfers and ADLs    Exercises General Exercises - Lower Extremity Ankle Circles/Pumps: AROM;Both;5 reps;Supine Long Arc Quad: AROM;Right;5 reps;Seated Heel Slides: AAROM;Both;5 reps;Supine Hip ABduction/ADduction: AAROM;Both;5 reps;Supine   Assessment/Plan    PT Assessment Patient needs continued PT services  PT Problem List Decreased strength;Decreased  mobility;Decreased safety awareness;Decreased range of motion;Decreased coordination;Decreased activity tolerance;Decreased balance;Pain;Decreased knowledge of use of DME       PT Treatment Interventions DME instruction;Therapeutic activities;Modalities;Therapeutic exercise;Patient/family education;Balance training;Functional mobility training;Neuromuscular re-education;Wheelchair mobility training    PT Goals (Current goals can be found in the Care Plan section)  Acute Rehab PT Goals Patient Stated Goal: return to facility PT Goal Formulation: With patient Time For Goal Achievement: 07/15/20 Potential to Achieve Goals: Fair Additional Goals Additional Goal #1: Pt will demonstrate 3/5 strength throughout L LE to assist with bed mobility    Frequency Min 3X/week   Barriers to discharge        Co-evaluation               AM-PAC PT "6 Clicks" Mobility  Outcome Measure Help needed turning from your back to your side while in a flat bed without using bedrails?: Total Help needed moving from lying on your back to sitting on the side of a flat bed without using bedrails?: Total Help needed moving to and from a bed to a chair (including a wheelchair)?: Total Help needed standing up from a chair using your arms (e.g.,  wheelchair or bedside chair)?: Total Help needed to walk in hospital room?: Total Help needed climbing 3-5 steps with a railing? : Total 6 Click Score: 6    End of Session Equipment Utilized During Treatment: Oxygen Activity Tolerance: Patient tolerated treatment well Patient left: in bed;with call bell/phone within reach;with bed alarm set Nurse Communication: Mobility status;Other (comment) (O2 sats) PT Visit Diagnosis: History of falling (Z91.81);Muscle weakness (generalized) (M62.81)    Time: 4656-8127 PT Time Calculation (min) (ACUTE ONLY): 30 min   Charges:   PT Evaluation $PT Eval Moderate Complexity: 1 Mod PT Treatments $Therapeutic Exercise: 8-22  mins        Abran Richard, PT Acute Rehab Services Pager [phone] [facility] Re[facility] [phone]    Karlton Lemon 07/01/2020, 2:53 PM

## 2020-07-01 NOTE — Progress Notes (Signed)
   Subjective:  Patient reports pain as mild.  Up with PT today.  Mild pain  Objective:   VITALS:   Vitals:   07/01/20 0227 07/01/20 0351 07/01/20 0620 07/01/20 0842  BP: 111/60 (!) 114/28 117/62 (!) 112/52  Pulse: 73 76  75  Resp: 15 16 18    Temp: 98.2 F (36.8 C) 98.6 F (37 C) 98.4 F (36.9 C) 98.3 F (36.8 C)  TempSrc: Oral Oral Oral Oral  SpO2: 97% 94% 99% 95%  Weight:        Neurovascular intact Sensation intact distally Intact pulses distally Dorsiflexion/Plantar flexion intact Incision: dressing C/D/I Compartments soft  Lab Results  Component Value Date   WBC 17.0 (H) 07/01/2020   HGB 10.7 (L) 07/01/2020   HCT 35.1 (L) 07/01/2020   MCV 95.4 07/01/2020   PLT 283 07/01/2020   BMET    Component Value Date/Time   NA 137 07/01/2020 0201   NA 147 (H) 12/05/2014 1135   K 4.8 07/01/2020 0201   CL 99 07/01/2020 0201   CO2 26 07/01/2020 0201   GLUCOSE 156 (H) 07/01/2020 0201   BUN 27 (H) 07/01/2020 0201   BUN 24 12/05/2014 1135   CREATININE 1.00 07/01/2020 0201   CALCIUM 8.6 (L) 07/01/2020 0201   GFRNONAA 58 (L) 07/01/2020 0201   GFRAA >60 06/27/2018 1812     Assessment/Plan: 1 Day Post-Op   Principal Problem:   Femur fracture, left (HCC) Active Problems:   HTN (hypertension)   Abnormality of gait   Trigeminal neuralgia of right side of face   Microcytic hypochromic anemia   Up with therapy - WBAT to LLE - maintain dressings untl follow up.  Change PRN if soaked  - lovenox q daily while in patient with SCDs for DVT ppx  - dc home on 81 mg asa daily x 6 weeks   Nicholes Stairs 07/01/2020, 3:31 PM   Geralynn Rile, MD [phone]

## 2020-07-01 NOTE — Plan of Care (Signed)
  Problem: Education: Goal: Verbalization of understanding the information provided (i.e., activity precautions, restrictions, etc) will improve Outcome: Progressing   Problem: Clinical Measurements: Goal: Postoperative complications will be avoided or minimized Outcome: Progressing   Problem: Pain Management: Goal: Pain level will decrease Outcome: Progressing   

## 2020-07-02 LAB — CBC
HCT: 25.8 % — ABNORMAL LOW (ref 36.0–46.0)
Hemoglobin: 7.8 g/dL — ABNORMAL LOW (ref 12.0–15.0)
MCH: 28.7 pg (ref 26.0–34.0)
MCHC: 30.2 g/dL (ref 30.0–36.0)
MCV: 94.9 fL (ref 80.0–100.0)
Platelets: 186 10*3/uL (ref 150–400)
RBC: 2.72 MIL/uL — ABNORMAL LOW (ref 3.87–5.11)
RDW: 14.3 % (ref 11.5–15.5)
WBC: 9.4 10*3/uL (ref 4.0–10.5)
nRBC: 0 % (ref 0.0–0.2)

## 2020-07-02 LAB — BASIC METABOLIC PANEL
Anion gap: 10 (ref 5–15)
BUN: 33 mg/dL — ABNORMAL HIGH (ref 8–23)
CO2: 24 mmol/L (ref 22–32)
Calcium: 7.8 mg/dL — ABNORMAL LOW (ref 8.9–10.3)
Chloride: 102 mmol/L (ref 98–111)
Creatinine, Ser: 1.11 mg/dL — ABNORMAL HIGH (ref 0.44–1.00)
GFR, Estimated: 51 mL/min — ABNORMAL LOW (ref >60)
Glucose, Bld: 100 mg/dL — ABNORMAL HIGH (ref 70–99)
Potassium: 4.1 mmol/L (ref 3.5–5.1)
Sodium: 136 mmol/L (ref 135–145)

## 2020-07-02 MED ORDER — POLYETHYLENE GLYCOL 3350 17 G PO PACK
17.0000 g | PACK | Freq: Every day | ORAL | Status: DC
  Administered 2020-07-02 – 2020-07-06 (×5): 17 g via ORAL
  Filled 2020-07-02 (×5): qty 1

## 2020-07-02 MED ORDER — LACTATED RINGERS IV BOLUS: 500.0000 mL | Freq: Once | INTRAVENOUS | Status: DC

## 2020-07-02 NOTE — NC FL2 (Signed)
[place] LEVEL OF CARE SCREENING TOOL     IDENTIFICATION  Patient Name: Wendy Erickson Birthdate: 1941/02/21 Sex: female Admission Date (Current Location): 06/30/2020  [facility] and [place] Number:  Herbalist and Address:  The . [facility], [place] [address] [place], [place] [address]      Provider Number: [id number]  Attending Physician Name and Address:  Oda Kilts, MD  Relative Name and Phone Number:       Current Level of Care: Hospital Recommended Level of Care: [place] Prior Approval Number:    Date Approved/Denied:   PASRR Number: [id number]  Discharge Plan: SNF    Current Diagnoses: Patient Active Problem List   Diagnosis Date Noted  . Femur fracture, left ([place]) 07/01/2020  . Symptomatic anemia 06/21/2018  . Microcytic hypochromic anemia 06/21/2018  . Fall   . Subarachnoid hemorrhage ([place]) 05/25/2016  . Subdural hematoma ([place]) 05/25/2016  . SAH (subarachnoid hemorrhage) ([place]) 05/25/2016  . Laceration of face-right supraorbital 05/25/2016  . Trigeminal neuralgia of right side of face 06/09/2015  . Abnormality of gait 12/05/2014  . Dysphagia, pharyngoesophageal phase 12/05/2014  . Cholelithiasis 05/14/2014  . Abdominal pain, epigastric 05/14/2014  . Systolic murmur 44/10/4740  . Chest pain 05/11/2014  . Atypical chest pain 05/11/2014  . 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 Hydronephrosis of right kidney 04/13/2013  . Absent kidney, acquired 04/13/2013  . Pyelonephritis 04/11/2013  . AKI (acute kidney injury) ([place]) 04/11/2013  . HTN (hypertension) 04/11/2013    Orientation RESPIRATION BLADDER Height & Weight     Self, Time, Situation, Place  O2 External catheter, Continent Weight: 165 lb (74.8 kg) Height:  5\' 5"  (165.1 cm)  BEHAVIORAL SYMPTOMS/MOOD NEUROLOGICAL BOWEL NUTRITION STATUS      Continent Diet (please see discharge summary)  AMBULATORY STATUS  COMMUNICATION OF NEEDS Skin     Verbally Surgical wounds (closed incision,left hip)                       Personal Care Assistance Level of Assistance  Bathing, Dressing, Feeding Bathing Assistance: Limited assistance Feeding assistance: Independent Dressing Assistance: Limited assistance     Functional Limitations Info  Sight, Hearing, Speech Sight Info: Adequate Hearing Info: Adequate Speech Info: Adequate    SPECIAL CARE FACTORS FREQUENCY  PT (By licensed PT), OT (By licensed OT)     PT Frequency: 5x per week OT Frequency: 5x per week            Contractures Contractures Info: Not present    Additional Factors Info  Code Status, Allergies, Psychotropic Code Status Info: DNR Allergies Info: Pineapple Psychotropic Info: sertraline (ZOLOFT) tablet 150 mg         Current Medications (07/02/2020):  This is the current hospital active medication list Current Facility-Administered Medications  Medication Dose Route Frequency Provider Last Rate Last Admin  . 0.9 %  sodium chloride infusion   Intravenous Continuous Nicholes Stairs, MD 125 mL/hr at 07/02/20 1014 Rate Verify at 07/02/20 1014  . acetaminophen (TYLENOL) tablet 1,000 mg  1,000 mg Oral Q8H Gaylan Gerold, DO   1,000 mg at 07/02/20 0516  . atorvastatin (LIPITOR) tablet 40 mg  40 mg Oral QHS Gaylan Gerold, DO   40 mg at 07/01/20 2123  . docusate sodium (COLACE) capsule 100 mg  100 mg Oral BID Nicholes Stairs, MD   100 mg at 07/02/20 1008  . enoxaparin (LOVENOX) injection 30 mg  30 mg Subcutaneous Q24H Nicholes Stairs, MD   30 mg at 07/01/20 1508  . ferrous sulfate tablet 325 mg  325 mg Oral BID Gaylan Gerold, DO   325 mg at 07/02/20 1008  . gabapentin (NEURONTIN) capsule 300 mg  300 mg Oral TID Gaylan Gerold, DO   300 mg at 07/02/20 1008  . menthol-cetylpyridinium (CEPACOL) lozenge 3 mg  1 lozenge Oral PRN Nicholes Stairs, MD       Or  . phenol [facility]) mouth spray 1 spray  1 spray  Mouth/Throat PRN Nicholes Stairs, MD      . metoCLOPramide [facility]) tablet 5-10 mg  5-10 mg Oral Q8H PRN Nicholes Stairs, MD       Or  . metoCLOPramide [facility]) injection 5-10 mg  5-10 mg Intravenous Q8H PRN Nicholes Stairs, MD      . ondansetron [facility]) tablet 4 mg  4 mg Oral Q6H PRN Nicholes Stairs, MD       Or  . ondansetron [facility]) injection 4 mg  4 mg Intravenous Q6H PRN Nicholes Stairs, MD      . oxyCODONE (Oxy IR/ROXICODONE) immediate release tablet 10 mg  10 mg Oral Q4H PRN Gaylan Gerold, DO   10 mg at 07/02/20 0315  . polyethylene glycol (MIRALAX / GLYCOLAX) packet 17 g  17 g Oral Daily Christian, Rylee, MD   17 g at 07/02/20 1008  . senna (SENOKOT) tablet 8.6 mg  1 tablet Oral Daily Gaylan Gerold, DO   8.6 mg at 07/02/20 1008  . sertraline (ZOLOFT) tablet 150 mg  150 mg Oral Daily Gaylan Gerold, DO   150 mg at 07/02/20 1008     Discharge Medications: Please see discharge summary for a list of discharge medications.  Relevant Imaging Results:  Relevant Lab Results:   Additional Information SSN [id number]  Vinie Sill, LCSWA

## 2020-07-02 NOTE — Progress Notes (Signed)
  Date: 07/02/2020  Patient name: Wendy Erickson  Medical record number: [id number]  Date of birth: 08/19/1940   This patient's plan of care was discussed with the house staff. Please see Dr. Chase Picket note for complete details. I concur with her findings.   Sid Falcon, MD 07/02/2020, 1:13 PM

## 2020-07-02 NOTE — Progress Notes (Addendum)
° °  Subjective:   PT notes from yesterday reviewed--recommending SNF at discharge Ortho notes reviewed--no changes  No significant overnight events.   Feeling ok this morning. Minimal pain. She feels like therapy went well yesterday. Her only minor complaint this morning is a slight sore throat which she says began post op. She notes swallowing ok.  Objective:  Vital signs in last 24 hours: Vitals:   07/01/20 0842 07/01/20 1628 07/01/20 2107 07/02/20 0254  BP: (!) 112/52 (!) 134/46 114/60 (!) 120/53  Pulse: 75 72 81 71  Resp:  18 20 13   Temp: 98.3 F (36.8 C) 98 F (36.7 C) 99.2 F (37.3 C) 98.2 F (36.8 C)  TempSrc: Oral Oral Oral Oral  SpO2: 95% 91%    Weight:        Physical Exam General: chronically ill appearing in NAD Throat: no erythema, edema or lesions Cardiac: RRR Pulm: on chronic 2L [place], lungs clear MSK: left hip dressing clean and intact  Assessment/Plan: Wendy Erickson is a 79 year old female with past medical history of hypertension, hyperlipidemia, peripheral neuropathy, left renal neoplasm, cerebellar ataxia and wheelchair-bound, on chronic 3 L of oxygen who presented to the hospital for a left femoral shaft fracture, status post ORIF  Principal Problem:   Femur fracture, left (HCC) Active Problems:   HTN (hypertension)   Abnormality of gait   Trigeminal neuralgia of right side of face   Microcytic hypochromic anemia  Left femur fracture s/p intramedullary nailing 11/23. Post operative anemia as expected. Also has a history of anemia with a baseline around 8-10. No BM since admission Poor UOP Plan Activity: WBAT. Continue PT/OT Dressing changes: change prn  Pain management: scheduled tylenol 1g TID, oxycodone 10mg  q4h prn Bowel management: continue colace, senakot. Start miralax VTE prophylaxis: lovenox while inpatient. Will start aspirin 81mg  for 6w at time of discharge Follow up appointment: orthopedics in 2w for staple removal Follow hgb while  inpatient. Transfuse for hgb <7. Resume iron supplementation.  Chronic problems #Spinocerebellar ataxia type III (chronic). Has poor functional status at baseline. #Dysphagia--SLP recommended dysphagia 3 dt on prior admission. Will change diet accordingly. #Hx of hypertension--On lopressor prior to admission however may be reasonable to continue to hold on discharge if she remains normotensive. #Chronic Bradycardia.  #Hyperlipidemia. Continue lipitor #Neuropathy. Continue gabapentin and zoloft.   Diet: Regular DVT: Lovenox CODE: DNR  Prior to Admission Living Arrangement: Nursing facility Anticipated Discharge Location: Nursing facility Barriers to Discharge: Physical therapy Dispo: Stable for discharge pending SNF availability  Wendy Hansen, MD Internal Medicine Resident PGY-2 Wendy Erickson Internal Medicine Residency Pager: [phone] 07/02/2020 8:30 AM    After 5pm on weekdays and 1pm on weekends: On Call pager [phone]

## 2020-07-02 NOTE — Progress Notes (Signed)
Wendy Erickson  MRN: [id number] DOB/Age: September 28, 1940 79 y.o. Physician: Ander Slade, M.D. 2 Days Post-Op Procedure(s) (LRB): INTRAMEDULLARY (IM) NAIL FEMORAL (Left)  Subjective: Patient resting comfortably in bed this a.m.  No complaints of hip pain. Vital Signs Temp:  [98 F (36.7 C)-99.2 F (37.3 C)] 98.2 F (36.8 C) (11/25 0254) Pulse Rate:  [71-81] 71 (11/25 0254) Resp:  [13-20] 13 (11/25 0254) BP: (112-134)/(46-60) 120/53 (11/25 0254) SpO2:  [91 %-95 %] 91 % (11/24 1628)  Lab Results Recent Labs    07/01/20 0201 07/02/20 0117  WBC 17.0* 9.4  HGB 10.7* 7.8*  HCT 35.1* 25.8*  PLT 283 186   BMET Recent Labs    07/01/20 0201 07/02/20 0117  NA 137 136  K 4.8 4.1  CL 99 102  CO2 26 24  GLUCOSE 156* 100*  BUN 27* 33*  CREATININE 1.00 1.11*  CALCIUM 8.6* 7.8*   INR  Date Value Ref Range Status  06/30/2020 1.0 0.8 - 1.2 Final    Comment:    (NOTE) INR goal varies based on device and disease states. Performed at [facility], [place] [address] [place], [place] [address]      Exam  Dressings intact, clean and dry, about the left hip and thigh.  Compartments soft.  Limited active ankle motion which by report is baseline.  Plan Continue postop plan per Dr. Stann Mainland.  Weightbearing as tolerated to left lower extremity for transfers. Wendy Erickson M Wendy Erickson 07/02/2020, 8:18 AM   Contact # [phone]

## 2020-07-02 NOTE — Discharge Summary (Addendum)
Name: Wendy Erickson MRN: [id number] DOB: [birth year] 79 y.o. PCP: Pcp, No  Date of Admission: 06/30/2020 12:07 PM Date of Discharge: 07/10/20 Attending Physician: Lucious Groves, DO  Discharge Diagnosis: 1. Mildly displaced left femoral fracture 2. Spinocerebellar ataxia type III 3. Hypertension 4. Chronic bradycardia 5. Hyperlipidemia 6. Neuropathy  Discharge Medications: Allergies as of 07/10/2020      Reactions   Pineapple       Medication List    STOP taking these medications   Chloraseptic 1.4 % Liqd Generic drug: phenol   Cranberry 500 MG Caps   famotidine 20 MG tablet Commonly known as: PEPCID   HYDROcodone-acetaminophen 5-325 MG tablet Commonly known as: NORCO/VICODIN   metoprolol tartrate 25 MG tablet Commonly known as: LOPRESSOR   ondansetron 4 MG tablet Commonly known as: ZOFRAN     TAKE these medications   acetaminophen 325 MG tablet Commonly known as: TYLENOL Take 650 mg by mouth every 6 (six) hours as needed for moderate pain or fever (max 10 in 24 hours).   amLODipine 5 MG tablet Commonly known as: NORVASC Take 1 tablet (5 mg total) by mouth daily.   atorvastatin 40 MG tablet Commonly known as: LIPITOR Take 40 mg by mouth at bedtime.   Calcarb 600/D 600-400 MG-UNIT tablet Generic drug: Calcium Carbonate-Vitamin D Take 1 tablet by mouth daily.   ferrous sulfate 325 (65 FE) MG EC tablet Take 1 tablet (325 mg total) by mouth 2 (two) times daily. What changed: when to take this   gabapentin 300 MG capsule Commonly known as: NEURONTIN Take 1 capsule (300 mg total) by mouth 3 (three) times daily. What changed: when to take this   HYDROcodone-acetaminophen 5-325 MG tablet Commonly known as: NORCO/VICODIN Take 2 tablets by mouth every 6 (six) hours as needed for moderate pain.   hydroxypropyl methylcellulose / hypromellose 2.5 % ophthalmic solution Commonly known as: ISOPTO TEARS / GONIOVISC Place 1 drop into both eyes 3 (three)  times daily as needed for dry eyes.   lisinopril 5 MG tablet Commonly known as: ZESTRIL Take 1 tablet (5 mg total) by mouth daily.   magnesium hydroxide 400 MG/5ML suspension Commonly known as: MILK OF MAGNESIA Take 30 mLs by mouth daily as needed for mild constipation.   pantoprazole 40 MG tablet Commonly known as: Protonix Take 1 tablet (40 mg total) by mouth 2 (two) times daily for 30 days, THEN 1 tablet (40 mg total) daily. Start taking on: July 10, 2020   polycarbophil 625 MG tablet Commonly known as: FIBERCON Take 625 mg by mouth 2 (two) times daily.   polyethylene glycol 17 g packet Commonly known as: MiraLax Take 17 g by mouth daily as needed for moderate constipation.   rivaroxaban 10 MG Tabs tablet Commonly known as: XARELTO Take 1 tablet (10 mg total) by mouth daily for 10 days. Start taking on: July 11, 2020   senna 8.6 MG tablet Commonly known as: SENOKOT Take 1 tablet by mouth 2 (two) times daily.   sertraline 100 MG tablet Commonly known as: ZOLOFT Take 150 mg by mouth daily. Take with 25 mg to equal 125 mg   Vitamin D 50 MCG (2000 UT) tablet Take 2,000 Units by mouth daily.     ASK your doctor about these medications   HYDROcodone-acetaminophen 5-325 MG tablet Commonly known as: NORCO/VICODIN Take 1-2 tablets by mouth every 6 (six) hours as needed for up to 7 days for moderate pain. Ask about: Should I take this medication?  Discharge Care Instructions  (From admission, onward)         Start     Ordered   07/10/20 0000  Change dressing (specify)       Comments: Dressing change: daily or if soiled   07/10/20 1242   07/10/20 0000  Discharge wound care:       Comments: Keep surgical wound covered with pressure dressing until patient has had follow up with Orthopedic surgery in the OP setting. Seh will need an appointment with [facility] shortly after being discharged   07/10/20 1242   07/10/20 0000  Discharge wound care:        Comments: Vedia Coffer will be removed during hospitalization.  Patient's wound should be covered with a compression bandage until 2-week follow-up with orthopedic surgery.   07/10/20 1242         **Patient should no longer be taking ASA for DVT prophylaxis. She will be started on Xarelto 10 mg daily for 10 days for DVT ppx due to recent upper GI bleed**  Disposition and follow-up:   Wendy Erickson was discharged from [facility] in Stable condition.  At the hospital follow up visit please address:  Mildly displaced left femoral fracture s/p intramedullary nailing 11/23 Activity: WBAT. Continue PT/OT Dressing changes: change daily and prn  Pain management: start back on home hydrocodone-APAP 5-325 mg q 6 hours, can increase pain medication if need be for PT. Patient has been on scheduled tylenol 1g TID, oxycodone 10mg  q4h prn during her hospitalization  Bowel management: continue colace, senakot, miralax  VTE prophylaxis: Will start Xarelto 10 mg daily for 10 days Follow up appointment: orthopedics sometime during the week of December  6-10th for wound check  Post operative anemia.  Follow up appointment: PCP 3-5d after discharge. Recheck hgb at that time.  Hypertension with bradycardia.  Lopressor DISCONTINUED at discharge. START amlodipine 5mg  daily. START lisinopril 5mg  daily. Follow up appointment: PCP. Re-evaluate renal function in 2-4w  Spinocerebellar ataxia type III. Follows with Dr. Olegario Messier with [facility].  Neuropathy Discharge medication: gabapentin 300mg  three times daily, zoloft 150mg  daily  2.  Labs / imaging needed at time of follow-up: CBC, BMP  3.  Pending labs/ test needing follow-up: none  Follow-up Appointments:  Follow-up Information    Nicholes Stairs, MD In 2 weeks.   Specialty: Orthopedic Surgery Why: For suture removal, For wound re-check Contact information: [address] [place]  [address] [phone]               Hospital Course: 76 yof with spinocerebellar ataxia who presented to [facility] from [place] following a fall resulting in left leg pain. Imaging in the ED revealed a left femur fracture. She underwent intramedullary nailing on 17/00 without complication.  Patient had postop upper GI bleed.  Therefore ASA is contraindicated.  We will start her on Xarelto 10 mg daily for 10 days for DVT prophylaxis.  Upper GI bleed: Developed multiple melanotic stools and was found to have an upper GI bleed.  EGD showed duodenal ulcer with remnant of blood.  Patient was started on PPI.  She should be on Protonix 40 mg twice daily for 1 month and then on 40 mg daily thereafter.  She should avoid all nonsteroidal anti-inflammatory medications or medications that can increase gastric acid production.  Biopsy results are pending at this time.    Discharge Vitals:   BP (!) 152/75 (BP Location: Left Arm)   Pulse  73   Temp 98 F (36.7 C) (Oral)   Resp 16   Ht 5\' 5"  (1.651 m)   Wt 88.2 kg   SpO2 97%   BMI 32.36 kg/m   Pertinent Labs, Studies, and Procedures:   DG Pelvis Portable Result Date: 06/30/2020 IMPRESSION: Oblique mildly displaced proximal left femoral shaft fracture.   CBC Latest Ref Rng & Units 07/10/2020 07/09/2020 07/08/2020  WBC 4.0 - 10.5 K/uL 13.1(H) 13.3(H) 14.5(H)  Hemoglobin 12.0 - 15.0 g/dL 7.9(L) 7.0(L) 7.9(L)  Hematocrit 36 - 46 % 25.4(L) 22.0(L) 25.1(L)  Platelets 150 - 400 K/uL 372 344 330   BMP Latest Ref Rng & Units 07/10/2020 07/09/2020 07/08/2020  Glucose 70 - 99 mg/dL 92 100(H) 103(H)  BUN 8 - 23 mg/dL 11 10 24(H)  Creatinine 0.44 - 1.00 mg/dL 0.66 0.60 0.74  BUN/Creat Ratio 11 - 26 - - -  Sodium 135 - 145 mmol/L 139 136 138  Potassium 3.5 - 5.1 mmol/L 4.3 3.0(L) 3.4(L)  Chloride 98 - 111 mmol/L 100 98 100  CO2 22 - 32 mmol/L 27 27 26   Calcium 8.9 - 10.3 mg/dL 8.5(L) 8.2(L) 8.3(L)   Discharge Instructions    Change dressing (specify)    Complete by: As directed    Dressing change: daily or if soiled   Diet - low sodium heart healthy   Complete by: As directed    Diet - low sodium heart healthy   Complete by: As directed    Diet - low sodium heart healthy   Complete by: As directed    Discharge wound care:   Complete by: As directed    Keep surgical wound covered with pressure dressing until patient has had follow up with Orthopedic surgery in the OP setting. Seh will need an appointment with [facility] shortly after being discharged   Discharge wound care:   Complete by: As directed    Vedia Coffer will be removed during hospitalization.  Patient's wound should be covered with a compression bandage until 2-week follow-up with orthopedic surgery.   Increase activity slowly   Complete by: As directed    Increase activity slowly   Complete by: As directed       Signed: Marianna Payment, MD Internal Medicine Resident PGY-2 [facility] Pager: [phone] 07/10/2020 12:43 PM

## 2020-07-03 ENCOUNTER — Encounter (HOSPITAL_COMMUNITY): Payer: Self-pay | Admitting: Orthopedic Surgery

## 2020-07-03 DIAGNOSIS — S7222XA Displaced subtrochanteric fracture of left femur, initial encounter for closed fracture: Secondary | ICD-10-CM

## 2020-07-03 DIAGNOSIS — D62 Acute posthemorrhagic anemia: Secondary | ICD-10-CM | POA: Diagnosis not present

## 2020-07-03 LAB — BASIC METABOLIC PANEL
Anion gap: 8 (ref 5–15)
BUN: 24 mg/dL — ABNORMAL HIGH (ref 8–23)
CO2: 23 mmol/L (ref 22–32)
Calcium: 8 mg/dL — ABNORMAL LOW (ref 8.9–10.3)
Chloride: 106 mmol/L (ref 98–111)
Creatinine, Ser: 0.81 mg/dL (ref 0.44–1.00)
GFR, Estimated: >60 mL/min (ref >60)
Glucose, Bld: 97 mg/dL (ref 70–99)
Potassium: 4.2 mmol/L (ref 3.5–5.1)
Sodium: 137 mmol/L (ref 135–145)

## 2020-07-03 LAB — CBC
HCT: 25 % — ABNORMAL LOW (ref 36.0–46.0)
Hemoglobin: 7.8 g/dL — ABNORMAL LOW (ref 12.0–15.0)
MCH: 29.1 pg (ref 26.0–34.0)
MCHC: 31.2 g/dL (ref 30.0–36.0)
MCV: 93.3 fL (ref 80.0–100.0)
Platelets: 167 10*3/uL (ref 150–400)
RBC: 2.68 MIL/uL — ABNORMAL LOW (ref 3.87–5.11)
RDW: 14.2 % (ref 11.5–15.5)
WBC: 9 10*3/uL (ref 4.0–10.5)
nRBC: 0 % (ref 0.0–0.2)

## 2020-07-03 MED ORDER — BISACODYL 10 MG RE SUPP
10.0000 mg | Freq: Once | RECTAL | Status: AC
  Administered 2020-07-03: 10 mg via RECTAL
  Filled 2020-07-03: qty 1

## 2020-07-03 NOTE — Progress Notes (Signed)
Subjective: 3 Days Post-Op Procedure(s) (LRB): INTRAMEDULLARY (IM) NAIL FEMORAL (Left) Patient reports pain as mild.  Pt incidentally c/o R knee pain.  Says it feels like there is a blister on her knee.  Also c/o not having HBO on TV.  Objective: Vital signs in last 24 hours: Temp:  [97.9 F (36.6 C)-98 F (36.7 C)] 98 F (36.7 C) (11/26 0300) Pulse Rate:  [80-88] 80 (11/26 0300) Resp:  [16-17] 17 (11/26 0300) BP: (136-149)/(54-69) 141/68 (11/26 0300) SpO2:  [90 %-92 %] 92 % (11/26 0300)  Intake/Output from previous day: 11/25 0701 - 11/26 0700 In: 4103.7 [P.O.:440; I.V.:3663.7] Out: 1800 [Urine:1800] Intake/Output this shift: No intake/output data recorded.  Recent Labs    06/30/20 1216 07/01/20 0201 07/02/20 0117 07/03/20 0329  HGB 12.3 10.7* 7.8* 7.8*   Recent Labs    07/02/20 0117 07/03/20 0329  WBC 9.4 9.0  RBC 2.72* 2.68*  HCT 25.8* 25.0*  PLT 186 167   Recent Labs    07/02/20 0117 07/03/20 0329  NA 136 137  K 4.1 4.2  CL 102 106  CO2 24 23  BUN 33* 24*  CREATININE 1.11* 0.81  GLUCOSE 100* 97  CALCIUM 7.8* 8.0*   Recent Labs    06/30/20 1216  INR 1.0    PE:  R knee and thigh without swelling or gross deformity.  Skin healthy and intact.  Wiggles toes on R (baseline).  NTTP at the knee.  No effusion.  ROM limited due to contractures.  L hip dressed and dry.  Wiggles toes on L (baseline).   Assessment/Plan: 3 Days Post-Op Procedure(s) (LRB): INTRAMEDULLARY (IM) NAIL FEMORAL (Left) Up with therapy  WBAT on L LE.    No issues apparent on the R LE.  Reassured pt.  Helped her with the TV but unfortunately did not find HBO.      Wylene Simmer 07/03/2020, 8:44 AM

## 2020-07-03 NOTE — Progress Notes (Signed)
° °  Subjective:   No significant overnight events.  This morning, patient reports that she feels well and denies pain in her leg. She asks whether it is morning or night. She is requesting assistance with the TV attena. She denies recent bowel movements. She has no further complaints or questions.  Objective:  Vital signs in last 24 hours: Vitals:   07/02/20 1002 07/02/20 1447 07/02/20 1944 07/03/20 0300  BP:  (!) 149/54 136/69 (!) 141/68  Pulse:  86 88 80  Resp:  17 16 17   Temp:  98 F (36.7 C) 97.9 F (36.6 C) 98 F (36.7 C)  TempSrc:  Oral  Oral  SpO2:  91% 90% 92%  Weight:      Height: 5\' 5"  (1.651 m)       Physical Exam General: chronically ill appearing in NAD Throat: no erythema, edema or lesions Cardiac: RRR Pulm: on chronic 2L [place], lungs clear MSK: left hip dressing clean and intact  Assessment/Plan: Wendy Erickson is a 79 year old female with past medical history of hypertension, hyperlipidemia, peripheral neuropathy, left renal neoplasm, cerebellar ataxia and wheelchair-bound, on chronic 3 L of oxygen who presented to the hospital for a left femoral shaft fracture, status post ORIF  Principal Problem:   Femur fracture, left (HCC) Active Problems:   HTN (hypertension)   Abnormality of gait   Trigeminal neuralgia of right side of face   Microcytic hypochromic anemia  Left femur fracture s/p intramedullary nailing 11/23. Post operative anemia as expected. Also has a history of anemia with a baseline around 8-10. No BM since admission Poor UOP Plan Activity: WBAT. Continue PT/OT Dressing changes: change prn  Pain management: scheduled tylenol 1g TID, oxycodone 10mg  q4h prn Bowel management: continue colace, senakot. Start miralax VTE prophylaxis: lovenox while inpatient. Will start aspirin 81mg  for 6w at time of discharge Follow up appointment: orthopedics in 2w for staple removal Follow hgb while inpatient. Transfuse for hgb <7. Resume iron  supplementation.  Chronic problems #Spinocerebellar ataxia type III (chronic). Has poor functional status at baseline. #Dysphagia--SLP recommended dysphagia 3 dt on prior admission. Will change diet accordingly. #Hx of hypertension--On lopressor prior to admission however may be reasonable to continue to hold on discharge if she remains normotensive. #Chronic Bradycardia.  #Hyperlipidemia. Continue lipitor #Neuropathy. Continue gabapentin and zoloft.   Diet: Regular DVT: Lovenox CODE: DNR  Prior to Admission Living Arrangement: Nursing facility Anticipated Discharge Location: Nursing facility Barriers to Discharge: Physical therapy Dispo: Stable for discharge pending SNF availability  Mitzi Hansen, MD Internal Medicine Resident PGY-2 [facility] Pager: [phone] 07/03/2020 5:38 AM    After 5pm on weekdays and 1pm on weekends: On Call pager [phone]

## 2020-07-03 NOTE — TOC CAGE-AID Note (Signed)
Transition of Care [facility]) - CAGE-AID Screening   Patient Details  Name: Wendy Erickson MRN: [id number] Date of Birth: 02/23/1941  Transition of [facility]) CM/SW Contact:    Emeterio Reeve, [place] Phone Number: 07/03/2020, 4:52 PM   Clinical Narrative:  CSW met with pt at bedside. CSW introduced self and explained role at the hospital.  Pt denies alcohol use. Pt denies substance use. Pt did not need any resources at this time.    CAGE-AID Screening:    Have You Ever Felt You Ought to Cut Down on Your Drinking or Drug Use?: No Have People Annoyed You By Critizing Your Drinking Or Drug Use?: No Have You Felt Bad Or Guilty About Your Drinking Or Drug Use?: No Have You Ever Had a Drink or Used Drugs First Thing In The Morning to Steady Your Nerves or to Get Rid of a Hangover?: No CAGE-AID Score: 0  Substance Abuse Education Offered: Yes    Blima Ledger, [place] Social Worker [phone]

## 2020-07-04 LAB — BASIC METABOLIC PANEL
Anion gap: 11 (ref 5–15)
BUN: 16 mg/dL (ref 8–23)
CO2: 23 mmol/L (ref 22–32)
Calcium: 8 mg/dL — ABNORMAL LOW (ref 8.9–10.3)
Chloride: 104 mmol/L (ref 98–111)
Creatinine, Ser: 0.74 mg/dL (ref 0.44–1.00)
GFR, Estimated: >60 mL/min (ref >60)
Glucose, Bld: 104 mg/dL — ABNORMAL HIGH (ref 70–99)
Potassium: 3.7 mmol/L (ref 3.5–5.1)
Sodium: 138 mmol/L (ref 135–145)

## 2020-07-04 LAB — CBC
HCT: 27 % — ABNORMAL LOW (ref 36.0–46.0)
Hemoglobin: 8.6 g/dL — ABNORMAL LOW (ref 12.0–15.0)
MCH: 29 pg (ref 26.0–34.0)
MCHC: 31.9 g/dL (ref 30.0–36.0)
MCV: 90.9 fL (ref 80.0–100.0)
Platelets: 197 10*3/uL (ref 150–400)
RBC: 2.97 MIL/uL — ABNORMAL LOW (ref 3.87–5.11)
RDW: 14.1 % (ref 11.5–15.5)
WBC: 12.6 10*3/uL — ABNORMAL HIGH (ref 4.0–10.5)
nRBC: 0.2 % (ref 0.0–0.2)

## 2020-07-04 MED ORDER — BISACODYL 10 MG RE SUPP
10.0000 mg | Freq: Once | RECTAL | Status: AC
  Administered 2020-07-04: 10 mg via RECTAL
  Filled 2020-07-04: qty 1

## 2020-07-04 NOTE — Plan of Care (Signed)
°  Problem: Education: Goal: Knowledge of General Education information will improve Description: Including pain rating scale, medication(s)/side effects and non-pharmacologic comfort measures Outcome: Progressing   Problem: Clinical Measurements: Goal: Will remain free from infection Outcome: Progressing   Problem: Activity: Goal: Risk for activity intolerance will decrease Outcome: Progressing   Problem: Nutrition: Goal: Adequate nutrition will be maintained Outcome: Progressing   Problem: Elimination: Goal: Will not experience complications related to bowel motility Outcome: Progressing   Problem: Pain Managment: Goal: General experience of comfort will improve Outcome: Progressing   

## 2020-07-04 NOTE — Progress Notes (Signed)
Orthopedic Tech Progress Note Patient Details:  Wendy Erickson Apr 22, 1941 [id number] RN asked about the Advance for patient. Patient is unable to feed herself so it wouldnt help her Patient ID: Wendy Erickson, female   DOB: 06-30-1941, 79 y.o.   MRN: [id number]   Wendy Erickson 07/04/2020, 5:29 PM

## 2020-07-04 NOTE — Progress Notes (Signed)
Wendy Erickson  MRN: [id number] DOB/Age: 1941-06-11 79 y.o. Physician: Wendy Erickson, M.D. 4 Days Post-Op Procedure(s) (LRB): INTRAMEDULLARY (IM) NAIL FEMORAL (Left)  Subjective: Patient resting comfortably in bed this a.m.  No complaints of hip pain. Patient no longer has complaints of left knee pain. Vital Signs Temp:  [98.1 F (36.7 C)-98.7 F (37.1 C)] 98.1 F (36.7 C) (11/27 0520) Pulse Rate:  [71-95] 71 (11/27 0520) Resp:  [16-17] 16 (11/27 0520) BP: (150-159)/(70-74) 150/70 (11/27 0520) SpO2:  [90 %-96 %] 96 % (11/27 0520)  Lab Results Recent Labs    07/03/20 0329 07/04/20 0202  WBC 9.0 12.6*  HGB 7.8* 8.6*  HCT 25.0* 27.0*  PLT 167 197   BMET Recent Labs    07/03/20 0329 07/04/20 0202  NA 137 138  K 4.2 3.7  CL 106 104  CO2 23 23  GLUCOSE 97 104*  BUN 24* 16  CREATININE 0.81 0.74  CALCIUM 8.0* 8.0*   INR  Date Value Ref Range Status  06/30/2020 1.0 0.8 - 1.2 Final    Comment:    (NOTE) INR goal varies based on device and disease states. Performed at [facility], [place] [address], [place], [place] [address]      Exam  Dressings intact, clean and dry, about the left hip and thigh.  Compartments soft.  Plan Continue postop plan per Dr. Stann Erickson. ABLA: Improving, continue to monitor. Weightbearing as tolerated to left lower extremity for transfers. Wendy Erickson 07/04/2020, 8:12 AM   Contact # [phone]

## 2020-07-04 NOTE — Progress Notes (Signed)
   Subjective:   No significant overnight events.  No change in plan from orthopedics.  Pt endorsing some nausea this morning.  Still no BM  Objective:  Vital signs in last 24 hours: Vitals:   07/03/20 0300 07/03/20 1300 07/03/20 2020 07/04/20 0520  BP: (!) 141/68 (!) 159/70 (!) 155/74 (!) 150/70  Pulse: 80 95 88 71  Resp: 17 17 16 16   Temp: 98 F (36.7 C) 98.7 F (37.1 C) 98.2 F (36.8 C) 98.1 F (36.7 C)  TempSrc: Oral Oral Oral Oral  SpO2: 92% 90% 94% 96%  Weight:      Height:        Physical Exam General: chronically ill appearing in NAD CV: extremities warm. No LE edema Pulm: breathing comfortably on 2L MSK: dressing to left hip clean and intact  Assessment/Plan: Wendy Erickson is a 79 year old female with past medical history of hypertension, hyperlipidemia, peripheral neuropathy, left renal neoplasm, cerebellar ataxia and wheelchair-bound, on chronic 3 L of oxygen who presented to the hospital for a left femoral shaft fracture, status post ORIF  Principal Problem:   Femur fracture, left ([place]) Active Problems:   HTN (hypertension)   SCA-3 (spinocerebellar ataxia type 3) (HCC)   Abnormality of gait   Dysphagia, pharyngoesophageal phase   Trigeminal neuralgia of right side of face   Microcytic hypochromic anemia   Acute postoperative anemia due to expected blood loss  Left femur fracture s/p intramedullary nailing 11/23. Post operative anemia improving. Constipation. One small BM since admission. No results from suppository from yesterday. Pt noting nausea this morning.  Plan Activity: WBAT. Continue PT/OT Dressing changes: change prn  Pain management: scheduled tylenol 1g TID, oxycodone 10mg  q4h prn Bowel management: continue colace, senakot, miralax. Suppository reordered.  VTE prophylaxis: lovenox while inpatient. Will start aspirin 81mg  for 6w at time of discharge Follow up appointment: orthopedics in 2w for staple removal If no BM and continues to  have persistent nausea, will obtain a KUB to evaluate for obstruction in the setting of opioid use.  Chronic problems #Spinocerebellar ataxia type III (chronic). Has poor functional status at baseline. #Dysphagia--SLP recommended dysphagia 3 dt on prior admission. Will change diet accordingly. #Hx of hypertension--On lopressor prior to admission however may be reasonable to continue to hold on discharge if she remains normotensive. #Chronic Bradycardia.  #Hyperlipidemia. Continue lipitor #Neuropathy. Continue gabapentin and zoloft.   Diet: Regular DVT: Lovenox CODE: DNR  Prior to Admission Living Arrangement: Nursing facility Anticipated Discharge Location: Nursing facility Barriers to Discharge: Physical therapy Dispo: Stable for discharge pending SNF availability  Mitzi Hansen, MD Internal Medicine Resident PGY-2 [facility] Pager: [phone] 07/04/2020 8:59 AM    After 5pm on weekdays and 1pm on weekends: On Call pager [phone]

## 2020-07-05 LAB — CBC
HCT: 26.8 % — ABNORMAL LOW (ref 36.0–46.0)
Hemoglobin: 8.4 g/dL — ABNORMAL LOW (ref 12.0–15.0)
MCH: 28.8 pg (ref 26.0–34.0)
MCHC: 31.3 g/dL (ref 30.0–36.0)
MCV: 91.8 fL (ref 80.0–100.0)
Platelets: 277 10*3/uL (ref 150–400)
RBC: 2.92 MIL/uL — ABNORMAL LOW (ref 3.87–5.11)
RDW: 14.3 % (ref 11.5–15.5)
WBC: 13 10*3/uL — ABNORMAL HIGH (ref 4.0–10.5)
nRBC: 0.4 % — ABNORMAL HIGH (ref 0.0–0.2)

## 2020-07-05 LAB — BASIC METABOLIC PANEL
Anion gap: 11 (ref 5–15)
BUN: 15 mg/dL (ref 8–23)
CO2: 25 mmol/L (ref 22–32)
Calcium: 7.8 mg/dL — ABNORMAL LOW (ref 8.9–10.3)
Chloride: 102 mmol/L (ref 98–111)
Creatinine, Ser: 0.8 mg/dL (ref 0.44–1.00)
GFR, Estimated: >60 mL/min (ref >60)
Glucose, Bld: 100 mg/dL — ABNORMAL HIGH (ref 70–99)
Potassium: 3.5 mmol/L (ref 3.5–5.1)
Sodium: 138 mmol/L (ref 135–145)

## 2020-07-05 LAB — SARS CORONAVIRUS 2 BY RT PCR (HOSPITAL ORDER, PERFORMED IN ~~LOC~~ HOSPITAL LAB): SARS Coronavirus 2: NEGATIVE

## 2020-07-05 MED ORDER — AMLODIPINE BESYLATE 5 MG PO TABS
5.0000 mg | ORAL_TABLET | Freq: Every day | ORAL | Status: DC
  Administered 2020-07-05 – 2020-07-10 (×6): 5 mg via ORAL
  Filled 2020-07-05 (×6): qty 1

## 2020-07-05 NOTE — Plan of Care (Signed)
  Problem: Education: Goal: Verbalization of understanding the information provided (i.e., activity precautions, restrictions, etc) will improve Outcome: Progressing   Problem: Activity: Goal: Ability to ambulate and perform ADLs will improve Outcome: Progressing   Problem: Clinical Measurements: Goal: Postoperative complications will be avoided or minimized Outcome: Progressing   Problem: Pain Management: Goal: Pain level will decrease Outcome: Progressing   

## 2020-07-05 NOTE — Progress Notes (Signed)
° °  Interm history/ Subjective:   Pt finally had a bowel movement last afternoon. She has had a couple of blood pressures above goal.  She feels like her pain is doing pretty well. No narcotic requirement since yesterday.  Objective:  Vital signs in last 24 hours: Vitals:   07/04/20 1541 07/04/20 1956 07/05/20 0100 07/05/20 0402  BP: (!) 159/98 (!) 183/67 (!) 197/64 (!) 184/62  Pulse: 71 81  (!) 51  Resp: 17 16  15   Temp: 98.7 F (37.1 C) 98.5 F (36.9 C)  97.9 F (36.6 C)  TempSrc: Axillary Oral  Oral  SpO2: 100% 97%  97%  Weight:      Height:        Physical Exam General: chronically ill appearing in NAD CV: RRR. Extremities warm Pulm: breathing comfortably on chronic 2L [place] Abd: soft, nontender  Assessment/Plan: Wendy Erickson is a 79 year old female with past medical history of hypertension, hyperlipidemia, peripheral neuropathy, left renal neoplasm, cerebellar ataxia and wheelchair-bound, on chronic 3 L of oxygen who presented to the hospital for a left femoral shaft fracture, status post ORIF  Principal Problem:   Femur fracture, left (HCC) Active Problems:   HTN (hypertension)   SCA-3 (spinocerebellar ataxia type 3) (HCC)   Abnormality of gait   Dysphagia, pharyngoesophageal phase   Trigeminal neuralgia of right side of face   Microcytic hypochromic anemia   Acute postoperative anemia due to expected blood loss  Left femur fracture s/p intramedullary nailing 11/23. Post operative anemia improving. Constipation resolved. Plan Activity: WBAT. Continue PT/OT Dressing changes: change prn  Pain management: scheduled tylenol 1g TID, oxycodone 10mg  q4h prn Bowel management: continue colace, senakot, miralax. Suppository reordered.  VTE prophylaxis: lovenox while inpatient. Will start aspirin 81mg  for 6w at time of discharge Follow up appointment: orthopedics in 2w for staple removal  Chronic Hypertension with hx of chronic bradycardia. On lopressor prior to  admission.  Blood pressures noted to increase yesterday with a couple of them being above hospital goal. HR ranging 50s-70s. Unsure if the hypertension is related to her becoming more euvolemic vs pain related. -Would favor continuing to hold lopressor due to bradycardia. -Will start amlodipine 5mg    Chronic problems #Spinocerebellar ataxia type III (chronic). Has poor functional status at baseline. #Dysphagia--SLP recommended dysphagia 3 dt on prior admission. Will change diet accordingly. #Hyperlipidemia. Continue lipitor #Neuropathy. Continue gabapentin and zoloft.   Diet: Regular DVT: Lovenox CODE: DNR  Prior to Admission Living Arrangement: Nursing facility Anticipated Discharge Location: Nursing facility Barriers to Discharge: Physical therapy Dispo: Stable for discharge pending SNF availability  Mitzi Hansen, MD Internal Medicine Resident PGY-2 [facility] Pager: [phone] 07/05/2020 5:45 AM    After 5pm on weekdays and 1pm on weekends: On Call pager [phone]

## 2020-07-05 NOTE — Progress Notes (Signed)
Notified MD on call about patients high blood pressure. MD will assess BP meds in the morning and adjust. Will continue to monitor.

## 2020-07-05 NOTE — TOC Initial Note (Signed)
Transition of Care [facility]) - Initial/Assessment Note    Patient Details  Name: Wendy Erickson MRN: [id number] Date of Birth: 07-22-1941  Transition of Care [facility]) CM/SW Contact:    Emeterio Reeve, [place] Phone Number: 07/05/2020, 11:31 AM  Clinical Narrative:                 CSW met with pt at bedside. CSW introduced self and explained her role at the hospital.  Pt reports she will be returning to [facility].   CSW reached out to admissions coordinator but did not get answer. CSW started insurance auth, references number [id number]. CSW requested new covid test fro MD.  Expected Discharge Plan: Long Term Nursing Home Barriers to Discharge: Continued Medical Work up   Patient Goals and CMS Choice Patient states their goals for this hospitalization and ongoing recovery are:: return to [employer].gov Compare Post Acute Care list provided to:: Patient Choice offered to / list presented to : Patient  Expected Discharge Plan and Services Expected Discharge Plan: [place]       Living arrangements for the past 2 months: [place]                                      Prior Living Arrangements/Services Living arrangements for the past 2 months: [place] Lives with:: Facility Resident Patient language and need for interpreter reviewed:: Yes        Need for Family Participation in Patient Care: Yes (Comment) Care giver support system in place?: Yes (comment)   Criminal Activity/Legal Involvement Pertinent to Current Situation/Hospitalization: No - Comment as needed  Activities of Daily Living Home Assistive Devices/Equipment: Wheelchair ADL Screening (condition at time of admission) Patient's cognitive ability adequate to safely complete daily activities?: Yes Is the patient deaf or have difficulty hearing?: No Does the patient have difficulty seeing, even when wearing glasses/contacts?: No Does the patient have  difficulty concentrating, remembering, or making decisions?: No Patient able to express need for assistance with ADLs?: Yes Does the patient have difficulty dressing or bathing?: Yes Independently performs ADLs?: No Communication: Needs assistance Is this a change from baseline?: Pre-admission baseline Dressing (OT): Needs assistance Is this a change from baseline?: Pre-admission baseline Grooming: Needs assistance Is this a change from baseline?: Pre-admission baseline Feeding: Needs assistance Is this a change from baseline?: Pre-admission baseline Bathing: Needs assistance Is this a change from baseline?: Pre-admission baseline Toileting: Needs assistance Is this a change from baseline?: Pre-admission baseline In/Out Bed: Needs assistance Is this a change from baseline?: Pre-admission baseline Walks in Home: Needs assistance Is this a change from baseline?: Pre-admission baseline Does the patient have difficulty walking or climbing stairs?: Yes Weakness of Legs: Both Weakness of Arms/Hands: Both  Permission Sought/Granted   Permission granted to share information with : Yes, Verbal Permission Granted     Permission granted to share info w AGENCY: SNF        Emotional Assessment Appearance:: Appears stated age Attitude/Demeanor/Rapport: Engaged Affect (typically observed): Appropriate Orientation: : Oriented to Self, Oriented to Place, Oriented to  Time, Oriented to Situation Alcohol / Substance Use: Not Applicable Psych Involvement: No (comment)  Admission diagnosis:  Hip fracture ([place]) [S72.009A] Closed fracture of left femur, unspecified fracture morphology, unspecified portion of femur, initial encounter ([place]) [S72.92XA] Patient Active Problem List   Diagnosis Date Noted  . Acute postoperative anemia due to expected  blood loss 07/03/2020  . Femur fracture, left ([place]) 07/01/2020  . Symptomatic anemia 06/21/2018  . Microcytic hypochromic anemia 06/21/2018  . Fall    . Subarachnoid hemorrhage ([place]) 05/25/2016  . Subdural hematoma ([place]) 05/25/2016  . SAH (subarachnoid hemorrhage) ([place]) 05/25/2016  . Laceration of face-right supraorbital 05/25/2016  . Trigeminal neuralgia of right side of face 06/09/2015  . Abnormality of gait 12/05/2014  . Dysphagia, pharyngoesophageal phase 12/05/2014  . Cholelithiasis 05/14/2014  . Abdominal pain, epigastric 05/14/2014  . Systolic murmur 85/92/9244  . Chest pain 05/11/2014  . Atypical chest pain 05/11/2014  . 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 Hydronephrosis of right kidney 04/13/2013  . Absent kidney, acquired 04/13/2013  . Pyelonephritis 04/11/2013  . AKI (acute kidney injury) ([place]) 04/11/2013  . HTN (hypertension) 04/11/2013   PCP:  Pcp, No Pharmacy:   [facility], [address] [place] [address] Phone: [phone] Fax: [phone]     Social Determinants of Health (SDOH) Interventions    Readmission Risk Interventions No flowsheet data found.  Emeterio Reeve, Latanya Presser, [place] Social Worker [phone]

## 2020-07-05 NOTE — Progress Notes (Signed)
Subjective: 5 Days Post-Op Procedure(s) (LRB): INTRAMEDULLARY (IM) NAIL FEMORAL (Left) Patient reports pain as mild.    Objective: Vital signs in last 24 hours: Temp:  [97.8 F (36.6 C)-98.7 F (37.1 C)] 98.3 F (36.8 C) (11/28 0841) Pulse Rate:  [51-85] 85 (11/28 0841) Resp:  [15-18] 16 (11/28 0841) BP: (159-197)/(62-98) 186/63 (11/28 0841) SpO2:  [91 %-100 %] 97 % (11/28 0841)  Intake/Output from previous day: 11/27 0701 - 11/28 0700 In: 360 [P.O.:360] Out: 350 [Urine:350] Intake/Output this shift: Total I/O In: 100 [P.O.:100] Out: -   Recent Labs    07/03/20 0329 07/04/20 0202 07/05/20 0625  HGB 7.8* 8.6* 8.4*   Recent Labs    07/04/20 0202 07/05/20 0625  WBC 12.6* 13.0*  RBC 2.97* 2.92*  HCT 27.0* 26.8*  PLT 197 277   Recent Labs    07/04/20 0202 07/05/20 0625  NA 138 138  K 3.7 3.5  CL 104 102  CO2 23 25  BUN 16 15  CREATININE 0.74 0.80  GLUCOSE 104* 100*  CALCIUM 8.0* 7.8*   No results for input(s): LABPT, INR in the last 72 hours.  Neurologically intact ABD soft Neurovascular intact Sensation intact distally Intact pulses distally Dorsiflexion/Plantar flexion intact Incision: dressing C/D/I and no drainage No cellulitis present Compartment soft no sign of DVT   Assessment/Plan: 5 Days Post-Op Procedure(s) (LRB): INTRAMEDULLARY (IM) NAIL FEMORAL (Left) Advance diet Up with therapy WBAT for transfers Stable from ortho standpoint   Wendy Erickson 07/05/2020, 8:56 AM

## 2020-07-06 MED ORDER — LISINOPRIL 5 MG PO TABS
5.0000 mg | ORAL_TABLET | Freq: Every day | ORAL | Status: DC
  Administered 2020-07-06 – 2020-07-10 (×5): 5 mg via ORAL
  Filled 2020-07-06 (×5): qty 1

## 2020-07-06 NOTE — Plan of Care (Signed)
Patient is s/p left IM nailing on 11/23. Mepilex dsgs clean dry and intact. Patient is in no distress or pain at this time. O2 at 3L [place] and that is baseline. Plan for discharge is to go back to [facility] - pending insurance [place]. Will continue to monitor and continue current POC.

## 2020-07-06 NOTE — Progress Notes (Signed)
Physical Therapy Treatment Patient Details Name: Wendy Erickson MRN: [id number] DOB: Jul 18, 1941 Today's Date: 07/06/2020    History of Present Illness Wendy Erickson is a 79 year old female with past medical history of hypertension, hyperlipidemia, peripheral neuropathy, left renal neoplasm, cerebellar ataxia and wheelchair-bound, on chronic 3 L of oxygen who presented to the hospital for a left femoral shaft fracture s/p IMN on 06/30/20.    PT Comments    Pt supine on arrival, agreeable to therapy session and with good participation and fair tolerance for mobility. Pt performed supine<>EOB transfer with +66modA and required +28min/modA for seated balance at EOB along with intermittent use of bed rail. Pt limited due to fatigue and once seated EOB noted to be incontinent, able to roll with minA for peri-care and changing of bed pads, which had black/tarry looking smears on them, RN notified. Pt with some redness to buttocks/sacral area and RN notified may benefit from mepilex foam pad, pillows placed for pt in semi-sidelying to offload and heels floated. Pt continues to benefit from skilled rehab in a post acute setting to maximize functional gains before returning to prior living situation.   Follow Up Recommendations  SNF;Supervision/Assistance - 24 hour;Supervision for mobility/OOB     Equipment Recommendations  Other (comment) (has DME at facility)    Recommendations for Other Services       Precautions / Restrictions Precautions Precautions: Fall Restrictions Weight Bearing Restrictions: Yes LLE Weight Bearing: Weight bearing as tolerated    Mobility  Bed Mobility Overal bed mobility: Needs Assistance Bed Mobility: Rolling;Supine to Sit;Sit to Supine Rolling: Min assist   Supine to sit: Mod assist;+2 for physical assistance;HOB elevated Sit to supine: Mod assist;+2 for physical assistance;HOB elevated   General bed mobility comments: Required cues for transfer technique and  sequencing, use of bed pad to facilitate, assist with L LE and trunk for all transfers  Transfers Overall transfer level: Needs assistance   Transfers: Lateral/Scoot Transfers          Lateral/Scoot Transfers: Total assist General transfer comment: does not stand baseline; attempted to have pt perform seated scoot but pt unable to assist/total A; deferred further 2/2 dizziness/fatigue  Ambulation/Gait                 Stairs             Wheelchair Mobility    Modified Rankin (Stroke Patients Only)       Balance Overall balance assessment: Needs assistance Sitting-balance support: Feet supported;Bilateral upper extremity supported Sitting balance-Leahy Scale: Poor Sitting balance - Comments: Requiring mod A; poor trunk control all directions; sat EOB for 4 mins with support Postural control:  (mostly posterior, some lateral LOB)                                  Cognition Arousal/Alertness: Awake/alert Behavior During Therapy: WFL for tasks assessed/performed Overall Cognitive Status: No family/caregiver present to determine baseline cognitive functioning Area of Impairment: Attention;Following commands;Problem solving;Awareness;Safety/judgement;Memory                 Orientation Level:  (orient to situation/reason for adm, self, not furth assessed) Current Attention Level: Sustained Memory: Decreased short-term memory Following Commands: Follows one step commands with increased time     Problem Solving: Slow processing;Difficulty sequencing;Requires verbal cues;Requires tactile cues        Exercises General Exercises - Lower Extremity Ankle Circles/Pumps: AROM;Strengthening;Both;10 reps;Supine Long Arc Quad: AROM;Both;10  reps;Seated Heel Slides: AROM;Both;10 reps;Supine Hip ABduction/ADduction: AAROM;Strengthening;Both;10 reps;Supine    General Comments General comments (skin integrity, edema, etc.): SpO2 WNL on 3L O2 [place] 92-96%  throughout mobility; HR 49-83 bpm      Pertinent Vitals/Pain Pain Assessment: Faces Faces Pain Scale: Hurts a little bit Pain Location: L hip with movement Pain Descriptors / Indicators: Grimacing Pain Intervention(s): Monitored during session;Repositioned    Home Living                      Prior Function            PT Goals (current goals can now be found in the care plan section) Acute Rehab PT Goals Patient Stated Goal: return to facility PT Goal Formulation: With patient Time For Goal Achievement: 07/15/20 Potential to Achieve Goals: Fair Progress towards PT goals: Progressing toward goals    Frequency    Min 3X/week      PT Plan Current plan remains appropriate    Co-evaluation              AM-PAC PT "6 Clicks" Mobility   Outcome Measure  Help needed turning from your back to your side while in a flat bed without using bedrails?: A Little Help needed moving from lying on your back to sitting on the side of a flat bed without using bedrails?: A Lot Help needed moving to and from a bed to a chair (including a wheelchair)?: Total Help needed standing up from a chair using your arms (e.g., wheelchair or bedside chair)?: Total Help needed to walk in hospital room?: Total Help needed climbing 3-5 steps with a railing? : Total 6 Click Score: 9    End of Session Equipment Utilized During Treatment: Oxygen Activity Tolerance: Patient tolerated treatment well Patient left: in bed;with call bell/phone within reach;with bed alarm set Nurse Communication: Mobility status (bed pads have black smear (dried blood?), pt needs mepilex) PT Visit Diagnosis: History of falling (Z91.81);Muscle weakness (generalized) (M62.81)     Time: 9201-0071 PT Time Calculation (min) (ACUTE ONLY): 23 min  Charges:  $Therapeutic Exercise: 8-22 mins $Therapeutic Activity: 8-22 mins                     Cherika Jessie P., PTA Acute Rehabilitation Services Pager:  [phone] Office: [place] 07/06/2020, 4:44 PM

## 2020-07-06 NOTE — Plan of Care (Signed)
  Problem: Pain Management: Goal: Pain level will decrease Outcome: Progressing   Problem: Education: Goal: Knowledge of General Education information will improve Description: Including pain rating scale, medication(s)/side effects and non-pharmacologic comfort measures Outcome: Progressing   Problem: Nutrition: Goal: Adequate nutrition will be maintained Outcome: Progressing   Problem: Pain Managment: Goal: General experience of comfort will improve Outcome: Progressing   Problem: Activity: Goal: Ability to ambulate and perform ADLs will improve Outcome: Not Progressing   Problem: Activity: Goal: Risk for activity intolerance will decrease Outcome: Not Progressing

## 2020-07-06 NOTE — Progress Notes (Signed)
   Interm history/ Subjective:   No significant overnight events. Feeling well this morning.  Objective:  Vital signs in last 24 hours: Vitals:   07/05/20 1429 07/05/20 1924 07/06/20 0400 07/06/20 0540  BP: (!) 185/53 (!) 174/59 (!) 198/90 (!) 198/69  Pulse: 60 67 (!) 46 (!) 49  Resp: 17 18 15 15   Temp: 97.8 F (36.6 C) 98.3 F (36.8 C) 98.2 F (36.8 C) 98.3 F (36.8 C)  TempSrc: Oral Oral Oral Oral  SpO2:  97% 94% 94%  Weight:      Height:        Physical Exam General: well appearing in NAD CV: RRR Pulm: lungs clear Skin: clean and intact dressing to left hip  Assessment/Plan: Wendy Erickson is a 79 year old female with past medical history of hypertension, hyperlipidemia, peripheral neuropathy, left renal neoplasm, cerebellar ataxia and wheelchair-bound, on chronic 3 L of oxygen who presented to the hospital for a left femoral shaft fracture, status post ORIF  Principal Problem:   Femur fracture, left (HCC) Active Problems:   HTN (hypertension)   SCA-3 (spinocerebellar ataxia type 3) (HCC)   Abnormality of gait   Dysphagia, pharyngoesophageal phase   Trigeminal neuralgia of right side of face   Microcytic hypochromic anemia   Acute postoperative anemia due to expected blood loss  Left femur fracture s/p intramedullary nailing 11/23. Post operative anemia stable Constipation resolved. Plan Activity: WBAT. Continue PT/OT Dressing changes: change prn  Pain management: scheduled tylenol 1g TID, oxycodone 10mg  q4h prn Bowel management: continue miralax and sennakot VTE prophylaxis: lovenox while inpatient. Will start aspirin 81mg  for 6w at time of discharge Follow up appointment: orthopedics in 2w for staple removal  Chronic Hypertension with hx of chronic bradycardia. Blood pressures are above goal today. Will start lisinopril 5mg  today. Continue amlodipine (started 11/28).  Diet: Regular DVT: Lovenox CODE: DNR  Prior to Admission Living Arrangement:  Nursing facility Anticipated Discharge Location: Nursing facility Barriers to Discharge: none Dispo: Medically Stable for discharge pending SNF availability  Mitzi Hansen, MD Internal Medicine Resident PGY-2 [facility] Pager: [phone] 07/06/2020 7:31 AM    After 5pm on weekdays and 1pm on weekends: On Call pager [phone]

## 2020-07-06 NOTE — Care Management Important Message (Signed)
Important Message  Patient Details  Name: Arnetia Bronk MRN: [id number] Date of Birth: Oct 09, 1940   Medicare Important Message Given:  Yes     Barb Merino DeWitt 07/06/2020, 3:35 PM

## 2020-07-06 NOTE — Social Work (Addendum)
[place] has requested a peer to peer to be completed by 9am on 07/07/20. MD can call [phone] option 5.  MD has been notified.   4:00pm- Pts SNF request has been denied. CSW will reach out to pt and facility. Pt was previously long term care at [facility] and will mostly likely return.   Emeterio Reeve, Latanya Presser, [place] Social Worker [phone]

## 2020-07-07 DIAGNOSIS — K922 Gastrointestinal hemorrhage, unspecified: Secondary | ICD-10-CM | POA: Diagnosis not present

## 2020-07-07 LAB — CBC WITH DIFFERENTIAL/PLATELET
Abs Immature Granulocytes: 0.75 10*3/uL — ABNORMAL HIGH (ref 0.00–0.07)
Basophils Absolute: 0.1 10*3/uL (ref 0.0–0.1)
Basophils Relative: 0 %
Eosinophils Absolute: 0 10*3/uL (ref 0.0–0.5)
Eosinophils Relative: 0 %
HCT: 21.9 % — ABNORMAL LOW (ref 36.0–46.0)
Hemoglobin: 6.8 g/dL — CL (ref 12.0–15.0)
Immature Granulocytes: 4 %
Lymphocytes Relative: 12 %
Lymphs Abs: 2.3 10*3/uL (ref 0.7–4.0)
MCH: 29.2 pg (ref 26.0–34.0)
MCHC: 31.1 g/dL (ref 30.0–36.0)
MCV: 94 fL (ref 80.0–100.0)
Monocytes Absolute: 0.8 10*3/uL (ref 0.1–1.0)
Monocytes Relative: 4 %
Neutro Abs: 16.1 10*3/uL — ABNORMAL HIGH (ref 1.7–7.7)
Neutrophils Relative %: 80 %
Platelets: 449 10*3/uL — ABNORMAL HIGH (ref 150–400)
RBC: 2.33 MIL/uL — ABNORMAL LOW (ref 3.87–5.11)
RDW: 14.7 % (ref 11.5–15.5)
WBC: 20.1 10*3/uL — ABNORMAL HIGH (ref 4.0–10.5)
nRBC: 2.6 % — ABNORMAL HIGH (ref 0.0–0.2)

## 2020-07-07 LAB — CBC
HCT: 23.9 % — ABNORMAL LOW (ref 36.0–46.0)
Hemoglobin: 7.9 g/dL — ABNORMAL LOW (ref 12.0–15.0)
MCH: 29 pg (ref 26.0–34.0)
MCHC: 33.1 g/dL (ref 30.0–36.0)
MCV: 87.9 fL (ref 80.0–100.0)
Platelets: 335 10*3/uL (ref 150–400)
RBC: 2.72 MIL/uL — ABNORMAL LOW (ref 3.87–5.11)
RDW: 17.1 % — ABNORMAL HIGH (ref 11.5–15.5)
WBC: 16.1 10*3/uL — ABNORMAL HIGH (ref 4.0–10.5)
nRBC: 0.7 % — ABNORMAL HIGH (ref 0.0–0.2)

## 2020-07-07 LAB — CREATININE, SERUM
Creatinine, Ser: 0.9 mg/dL (ref 0.44–1.00)
GFR, Estimated: >60 mL/min (ref >60)

## 2020-07-07 LAB — PREPARE RBC (CROSSMATCH)

## 2020-07-07 MED ORDER — SODIUM CHLORIDE 0.9% IV SOLUTION: Freq: Once | INTRAVENOUS | Status: AC

## 2020-07-07 MED ORDER — PANTOPRAZOLE SODIUM 40 MG IV SOLR: 40.0000 mg | INTRAVENOUS | Status: DC

## 2020-07-07 MED ORDER — LACTATED RINGERS IV BOLUS
500.0000 mL | Freq: Once | INTRAVENOUS | Status: AC
  Administered 2020-07-07: 500 mL via INTRAVENOUS

## 2020-07-07 MED ORDER — SODIUM CHLORIDE 0.9 % IV SOLN
8.0000 mg/h | INTRAVENOUS | Status: DC
  Administered 2020-07-07 (×2): 8 mg/h via INTRAVENOUS
  Filled 2020-07-07 (×4): qty 80

## 2020-07-07 MED ORDER — SODIUM CHLORIDE 0.9 % IV SOLN: INTRAVENOUS | Status: DC

## 2020-07-07 MED ORDER — PANTOPRAZOLE SODIUM 40 MG IV SOLR
40.0000 mg | Freq: Two times a day (BID) | INTRAVENOUS | Status: DC
  Administered 2020-07-07: 40 mg via INTRAVENOUS
  Filled 2020-07-07: qty 40

## 2020-07-07 NOTE — Progress Notes (Signed)
   07/07/20 0538  Assess: MEWS Score  Temp 97.7 F (36.5 C)  BP (!) 91/45  Pulse Rate (!) 50  Resp 16  Level of Consciousness Alert  SpO2 99 %  O2 Device Nasal Cannula  O2 Flow Rate (L/min) 3 L/min  Assess: MEWS Score  MEWS Temp 0  MEWS Systolic 1  MEWS Pulse 1  MEWS RR 0  MEWS LOC 0  MEWS Score 2  MEWS Score Color Yellow  Assess: if the MEWS score is Yellow or Red  Were vital signs taken at a resting state? Yes  Focused Assessment No change from prior assessment  Early Detection of Sepsis Score *See Row Information* Low  MEWS guidelines implemented *See Row Information* Yes (MD aware of situation; orders for blood transfusion placed)  Treat  MEWS Interventions Other (Comment) (blood transfusion ordered)  Take Vital Signs  Increase Vital Sign Frequency  Yellow: Q 2hr X 2 then Q 4hr X 2, if remains yellow, continue Q 4hrs  Escalate  MEWS: Escalate Yellow: discuss with charge nurse/RN and consider discussing with provider and RRT  Notify: Charge Nurse/RN  Name of Charge Nurse/RN Notified Blanch Media, RN  Date Charge Nurse/RN Notified 07/07/20  Notify: Rapid Response  Name of Rapid Response RN Notified  (called MD )  Document  Patient Outcome Other (Comment) (blood transfusion ordered; had ivf bolus)

## 2020-07-07 NOTE — Progress Notes (Signed)
HD#7 Subjective:  Overnight Events: Patient had multiple melenotic BM and a low Hgb overnight of 6.8. 1 unit of PRBC ordered.   Patient resting in bed comfortably.  She states that she is feeling fatigued but denies any shortness of breath, chest pain, or abdominal pain.  Patient states that she has never had a colonoscopy but does admit to a EGD in the past.  Objective:  Vital signs in last 24 hours: Vitals:   07/07/20 1240 07/07/20 1245 07/07/20 1250 07/07/20 1300  BP: 134/70 126/65 125/64 134/62  Pulse: 87 85 84 88  Resp: 18 17 16 17   Temp: 98.6 F (37 C) 98.6 F (37 C) 98.6 F (37 C) 98.5 F (36.9 C)  TempSrc: Oral Oral Oral Oral  SpO2: 99% 96% 99% 95%  Weight:      Height:       Supplemental O2: Room Air SpO2: 95 % O2 Flow Rate (L/min):   Physical Exam:  Physical Exam Constitutional:      Appearance: She is obese.  HENT:     Head: Normocephalic and atraumatic.  Eyes:     Comments: Pale palpebral fissures  Cardiovascular:     Rate and Rhythm: Normal rate and regular rhythm.     Pulses: Normal pulses.     Heart sounds: Normal heart sounds.  Pulmonary:     Effort: Pulmonary effort is normal.     Breath sounds: Normal breath sounds.  Abdominal:     General: Abdomen is flat. There is no distension.     Palpations: Abdomen is soft.     Tenderness: There is no abdominal tenderness.  Musculoskeletal:        General: No swelling. Normal range of motion.     Cervical back: Normal range of motion.  Skin:    General: Skin is warm and dry.     Coloration: Skin is pale.  Neurological:     General: No focal deficit present.     Mental Status: She is alert and oriented to person, place, and time.     Filed Weights   06/30/20 1730  Weight: 74.8 kg     Intake/Output Summary (Last 24 hours) at 07/07/2020 1318 Last data filed at 07/06/2020 2100 Gross per 24 hour  Intake --  Output 900 ml  Net -900 ml   Net IO Since Admission: 1,810.55 mL [07/07/20  1318]  Pertinent Labs: CBC Latest Ref Rng & Units 07/07/2020 07/05/2020 07/04/2020  WBC 4.0 - 10.5 K/uL 20.1(H) 13.0(H) 12.6(H)  Hemoglobin 12.0 - 15.0 g/dL 6.8(LL) 8.4(L) 8.6(L)  Hematocrit 36 - 46 % 21.9(L) 26.8(L) 27.0(L)  Platelets 150 - 400 K/uL 449(H) 277 197    CMP Latest Ref Rng & Units 07/07/2020 07/05/2020 07/04/2020  Glucose 70 - 99 mg/dL - 100(H) 104(H)  BUN 8 - 23 mg/dL - 15 16  Creatinine 0.44 - 1.00 mg/dL 0.90 0.80 0.74  Sodium 135 - 145 mmol/L - 138 138  Potassium 3.5 - 5.1 mmol/L - 3.5 3.7  Chloride 98 - 111 mmol/L - 102 104  CO2 22 - 32 mmol/L - 25 23  Calcium 8.9 - 10.3 mg/dL - 7.8(L) 8.0(L)  Total Protein 6.5 - 8.1 g/dL - - -  Total Bilirubin 0.3 - 1.2 mg/dL - - -  Alkaline Phos 38 - 126 U/L - - -  AST 15 - 41 U/L - - -  ALT 0 - 44 U/L - - -    Imaging: No results found.  Assessment/Plan:  Principal Problem:   Femur fracture, left (HCC) Active Problems:   HTN (hypertension)   SCA-3 (spinocerebellar ataxia type 3) (HCC)   Abnormality of gait   Dysphagia, pharyngoesophageal phase   Trigeminal neuralgia of right side of face   Microcytic hypochromic anemia   Acute postoperative anemia due to expected blood loss   Patient Summary: Wendy Erickson is a 79 year old female with past medical history of hypertension, hyperlipidemia, peripheral neuropathy, left renal neoplasm, cerebellar ataxia and wheelchair-bound, on chronic 3 L of oxygen who presented to [facility] for a left femoral shaft fracture, status post ORIF   Left femur fracture s/p ORIF (11/23) Patient is status post ORIF with pain well controlled Tylenol 1 g 3 times daily and oxycodone 10 mg every 4 as needed.  There is no obvious signs of bleeding or infection around the wound. - Will hold off on ASA until hgb has stabilized.  Normocytic Anemia: Patient had a low hemoglobin overnight of 6.8 after having 4 large melanotic bowel movements overnight.  Patient does admit to fatigue.  She  denies any history of bloody bowel movements in the past.  She has had an EGD but cannot remember the results.  But she has never had a colonoscopy in the past.  Patient was given 1 unit of packed red blood cells and started on PPI due to concern for upper GI bleed. -GI consulted today - PRBC pending  - FOBT pending  - D/c lovenox and start SCDs  Constipation:  Patient had 4 episodes of melenotic stools overnight.   Chronic HTN HLD: - Continue lisinopril and amlodipine  - Continue atorvastatin   Diet: Carb-Modified IVF: None,None VTE: SCDs Code: DNR PT/OT recs: Pending, none. TOC recs: pending   Dispo: Anticipated discharge to Skilled nursing facility pending improvement.   Please contact the on call pager after 5 pm and on weekends at [phone].

## 2020-07-07 NOTE — Progress Notes (Addendum)
0524 Informed consent completed by patient. Received verbal consent for blood transfusion from the patient herself. Unable to physically sign due to having difficulty writing and patient asked me to sign for her.

## 2020-07-07 NOTE — Consult Note (Signed)
Referring Provider: Internal Medicine Primary Care Physician:  Pcp, No Primary Gastroenterologist:  Althia Forts  Reason for Consultation:  Melena, anemia  HPI: Wendy Erickson is a 79 y.o. female with past medical history of left renal neoplasm, cerebellar ataxia (wheelchair-bound), currently hospitalized for left femur fracture s/p ORIF 11/23 presenting for consultation of melena and anemia.  Patient had 4 melenic bowel movements overnight with a drop in hemoglobin to 6.8 from 8.4.  Patient also reports an episode of upper abdominal discomfort overnight, though this has since resolved.  Also had some nausea but denies any vomiting.  She states she had some melena the day prior to admission and has had intermittent melena for many months.  Denies any hematochezia.  Reports history of an EGD in [place] several years ago, reportedly with PUD.  Denies any aspirin or NSAID use.  Denies prior colonoscopy.  Denies family history of colon cancer or gastrointestinal malignancy.  Past Medical History:  Diagnosis Date  . Abnormality of gait 12/05/2014  . Arthritis    "knees" (05/14/2014)  . 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 Chronic kidney disease    left renal neoplasm  . Depression   . Dysphagia, pharyngoesophageal phase 12/05/2014  . Foot fracture, right   . Gait disorder   . Heart murmur   . High cholesterol   . History of gout   . Hypertension   . Kidney malignancy ([place])    left renal neoplasm  . Osteopenia   . Peripheral neuropathy   . 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 Shingles    Right V1 distribution  . Symptomatic anemia 06/21/2018  . Trigeminal neuralgia of right side of face 06/09/2015   V1 distribution    Past Surgical History:  Procedure Laterality Date  . BACK SURGERY    . CATARACT EXTRACTION W/ INTRAOCULAR LENS IMPLANT Left   . DILATION AND CURETTAGE OF UTERUS    . FEMUR IM NAIL Left 06/30/2020   Procedure: INTRAMEDULLARY (IM) NAIL  FEMORAL;  Surgeon: Nicholes Stairs, MD;  Location: [facility];  Service: Orthopedics;  Laterality: Left;  . KNEE ARTHROSCOPY Bilateral   . LAPAROSCOPIC NEPHRECTOMY  10/06/2011   Procedure: LAPAROSCOPIC NEPHRECTOMY;  Surgeon: Dutch Gray, MD;  Location: [facility];  Service: Urology;  Laterality: Left;  LEFT LAPAROSCOPIC RADICAL NEPHRECTOMY   . LUMBAR DISC SURGERY     "herniated disc"  . TONSILLECTOMY    . VAGINAL HYSTERECTOMY      Prior to Admission medications   Medication Sig Start Date End Date Taking? Authorizing Provider  acetaminophen (TYLENOL) 325 MG tablet Take 650 mg by mouth every 6 (six) hours as needed for moderate pain or fever (max 10 in 24 hours).    Yes [provider]  atorvastatin (LIPITOR) 40 MG tablet Take 40 mg by mouth at bedtime.    Yes [provider]  Calcium Carbonate-Vitamin D (CALCARB 600/D) 600-400 MG-UNIT per tablet Take 1 tablet by mouth daily.    Yes [provider]  Cholecalciferol (VITAMIN D) 50 MCG (2000 UT) tablet Take 2,000 Units by mouth daily.   Yes [provider]  Cranberry 500 MG CAPS Take 500 mg by mouth daily.   Yes [provider]  famotidine (PEPCID) 20 MG tablet Take 20 mg by mouth daily.   Yes [provider]  gabapentin (NEURONTIN) 300 MG capsule Take 1 capsule (300 mg total) by mouth 3 (three) times daily. Patient taking differently: Take 300 mg by mouth  4 (four) times daily.  07/03/18  Yes Kathrynn Ducking, MD  hydroxypropyl methylcellulose / hypromellose (ISOPTO TEARS / GONIOVISC) 2.5 % ophthalmic solution Place 1 drop into both eyes 3 (three) times daily as needed for dry eyes.   Yes [provider]  magnesium hydroxide (MILK OF MAGNESIA) 400 MG/5ML suspension Take 30 mLs by mouth daily as needed for mild constipation.   Yes [provider]  metoprolol tartrate (LOPRESSOR) 25 MG tablet Take 12.5 mg by mouth 2 (two) times daily.   Yes [provider]  phenol  (CHLORASEPTIC) 1.4 % LIQD Use as directed 1 spray in the mouth or throat every 6 (six) hours as needed for throat irritation / pain.   Yes [provider]  polycarbophil (FIBERCON) 625 MG tablet Take 625 mg by mouth 2 (two) times daily.    Yes [provider]  senna (SENOKOT) 8.6 MG tablet Take 1 tablet by mouth 2 (two) times daily.    Yes [provider]  sertraline (ZOLOFT) 100 MG tablet Take 150 mg by mouth daily. Take with 25 mg to equal 125 mg   Yes [provider]  aspirin EC 81 MG tablet Take 1 tablet (81 mg total) by mouth daily. Swallow whole. 06/30/20 08/11/20  Nicholes Stairs, MD  ferrous sulfate 325 (65 FE) MG EC tablet Take 1 tablet (325 mg total) by mouth 2 (two) times daily. Patient taking differently: Take 325 mg by mouth 3 (three) times daily.  06/22/18 06/22/19  Elgergawy, Silver Huguenin, MD  HYDROcodone-acetaminophen (NORCO/VICODIN) 5-325 MG tablet Take 2 tablets by mouth every 6 (six) hours as needed for moderate pain. 06/30/20 06/30/21  Nicholes Stairs, MD  HYDROcodone-acetaminophen (NORCO/VICODIN) 5-325 MG tablet Take 1-2 tablets by mouth every 6 (six) hours as needed for up to 7 days for moderate pain. 06/30/20 07/07/20  Nicholes Stairs, MD  ondansetron (ZOFRAN) 4 MG tablet Take 4 mg by mouth 4 (four) times daily as needed for nausea or vomiting. Patient not taking: Reported on 06/30/2020    [provider]  polyethylene glycol (MIRALAX) packet Take 17 g by mouth daily as needed for moderate constipation. Patient not taking: Reported on 06/30/2020 05/14/14   Domenic Polite, MD    Scheduled Meds: . acetaminophen  1,000 mg Oral Q8H  . amLODipine  5 mg Oral Daily  . atorvastatin  40 mg Oral QHS  . ferrous sulfate  325 mg Oral BID  . gabapentin  300 mg Oral TID  . lisinopril  5 mg Oral Daily  . sertraline  150 mg Oral Daily   Continuous Infusions: . pantoprozole (PROTONIX) infusion     PRN  Meds:.menthol-cetylpyridinium **OR** phenol, metoCLOPramide **OR** metoCLOPramide (REGLAN) injection, ondansetron **OR** ondansetron (ZOFRAN) IV, oxyCODONE  Allergies as of 06/30/2020 - Review Complete 06/30/2020  Allergen Reaction Noted  . Pineapple  05/25/2016    Family History  Problem Relation Age of Onset  . Ataxia Father   . Dementia Mother     Social History   Socioeconomic History  . Marital status: Single    Spouse name: Not on file  . Number of children: 1  . Years of education: college 2  . Highest education level: Not on file  Occupational History  . Occupation: retired    Fish farm manager: RETIRED  Tobacco Use  . Smoking status: Former Smoker    Packs/day: 1.00    Years: 40.00    Pack years: 40.00    Types: Cigarettes    Quit  date: 08/08/2005    Years since quitting: 14.9  . Smokeless tobacco: Never Used  Vaping Use  . Vaping Use: Never used  Substance and Sexual Activity  . Alcohol use: No    Comment: wine on Friday  . Drug use: No  . Sexual activity: Not Currently  Other Topics Concern  . Not on file  Social History Narrative   Patient is left handed.   Patient does not drink caffeine.   Resides at [facility].   Social Determinants of Health   Financial Resource Strain:   . Difficulty of Paying Living Expenses: Not on file  Food Insecurity:   . Worried About Charity fundraiser in the Last Year: Not on file  . Ran Out of Food in the Last Year: Not on file  Transportation Needs:   . Lack of Transportation (Medical): Not on file  . Lack of Transportation (Non-Medical): Not on file  Physical Activity:   . Days of Exercise per Week: Not on file  . Minutes of Exercise per Session: Not on file  Stress:   . Feeling of Stress : Not on file  Social Connections:   . Frequency of Communication with Friends and Family: Not on file  . Frequency of Social Gatherings with Friends and Family: Not on file  . Attends Religious Services: Not on file  .  Active Member of Clubs or Organizations: Not on file  . Attends Archivist Meetings: Not on file  . Marital Status: Not on file  Intimate Partner Violence:   . Fear of Current or Ex-Partner: Not on file  . Emotionally Abused: Not on file  . Physically Abused: Not on file  . Sexually Abused: Not on file    Review of Systems: Review of Systems  Constitutional: Negative for chills and fever.  HENT: Negative for hearing loss and tinnitus.   Eyes: Negative for pain and redness.  Respiratory: Negative for cough and shortness of breath.   Cardiovascular: Negative for chest pain and palpitations.  Gastrointestinal: Positive for abdominal pain, diarrhea, melena and nausea. Negative for blood in stool, constipation, heartburn and vomiting.  Genitourinary: Negative for flank pain and hematuria.  Musculoskeletal: Positive for falls. Negative for myalgias.  Skin: Negative for itching and rash.  Neurological: Negative for seizures and loss of consciousness.  Endo/Heme/Allergies: Negative for polydipsia. Does not bruise/bleed easily.  Psychiatric/Behavioral: Negative for substance abuse. The patient is not nervous/anxious.      Physical Exam: Vital signs: Vitals:   07/07/20 1250 07/07/20 1300  BP: 125/64 134/62  Pulse: 84 88  Resp: 16 17  Temp: 98.6 F (37 C) 98.5 F (36.9 C)  SpO2: 99% 95%   Last BM Date: 07/05/20 Physical Exam Vitals reviewed.  Constitutional:      General: She is not in acute distress. HENT:     Head: Normocephalic and atraumatic.     Nose: Nose normal. No congestion.     Mouth/Throat:     Mouth: Mucous membranes are moist.     Pharynx: Oropharynx is clear.  Eyes:     General: No scleral icterus.    Extraocular Movements: Extraocular movements intact.     Comments: Conjunctival pallor  Cardiovascular:     Rate and Rhythm: Normal rate and regular rhythm.     Pulses: Normal pulses.     Heart sounds: Normal heart sounds.  Pulmonary:     Effort:  Pulmonary effort is normal. No respiratory distress.  Breath sounds: Normal breath sounds.  Abdominal:     General: Bowel sounds are normal. There is no distension.     Palpations: Abdomen is soft. There is no mass.     Tenderness: There is abdominal tenderness (mild, epigastric). There is no guarding or rebound.     Hernia: No hernia is present.  Musculoskeletal:        General: No swelling or deformity.     Cervical back: Normal range of motion and neck supple.  Skin:    General: Skin is warm and dry.  Neurological:     General: No focal deficit present.     Mental Status: She is oriented to person, place, and time. She is lethargic.  Psychiatric:        Mood and Affect: Mood normal.        Behavior: Behavior normal. Behavior is cooperative.     GI:  Lab Results: Recent Labs    07/05/20 0625 07/07/20 0355  WBC 13.0* 20.1*  HGB 8.4* 6.8*  HCT 26.8* 21.9*  PLT 277 449*   BMET Recent Labs    07/05/20 0625 07/07/20 0355  NA 138  --   K 3.5  --   CL 102  --   CO2 25  --   GLUCOSE 100*  --   BUN 15  --   CREATININE 0.80 0.90  CALCIUM 7.8*  --    LFT No results for input(s): PROT, ALBUMIN, AST, ALT, ALKPHOS, BILITOT, BILIDIR, IBILI in the last 72 hours. PT/INR No results for input(s): LABPROT, INR in the last 72 hours.   Studies/Results: No results found.  Impression: Melena, anemia: PUD versus gastritis -Hemoglobin 6.8 today, decreased from 8.4 yesterday -BUN 16 with creatinine 0.74 yesterday, no BUN from today, though creatinine remains normal at 0.9 -Was on Lovenox 30 mg, discontinued today  Plan: EGD tomorrow.  I thoroughly discussed the procedure with the patient, patient's son Gwyndolyn Saxon, and patient's Sister Baldo Ash.  Discussed nature, alternatives (medical management/PPI) benefits (possible diagnosis/treatment), and risks (including but not limited to bleeding, infection, perforation, anesthesia/cardiac and pulmonary complications).  Patient,  patient's son, and patient sister all verbalized understanding and gave verbal consent to proceed with EGD tomorrow.  Clear liquid diet, n.p.o. after midnight.  Protonix drip.  Continue to monitor H&H with transfusion as needed to maintain hemoglobin greater than 7.  Continue to hold Lovenox.  [facility] will follow.   LOS: 7 days   Salley Slaughter  PA-C 07/07/2020, 1:32 PM  Contact #  [phone]

## 2020-07-07 NOTE — Progress Notes (Addendum)
Patient had 4 liquid black stools back to back since 0150. Got her changed and cleaned up x3 with Conley Canal, NT and Juliene Pina, RN. Vital signs stable except for new onset tachycardia. We were turning her back and forth for clean up so I believe that has a factor to it. Called Dr. Shon Baton, Internal Med PGY1 and made her aware of what was going on. We went through her med list and saw that she got her iron pill (which may be contributing to the dark stools) and stool softener. Dr. Shon Baton ordered a stat CBC and she told me to recheck her vital signs in one hour to make sure her tachycardia is resolved and there aren't any more changes going on with her. I will let the patient rest a little while and will continue to monitor her.

## 2020-07-07 NOTE — Plan of Care (Signed)
  Problem: Education: Goal: Knowledge of General Education information will improve Description: Including pain rating scale, medication(s)/side effects and non-pharmacologic comfort measures Outcome: Progressing   Problem: Clinical Measurements: Goal: Will remain free from infection Outcome: Progressing   Problem: Activity: Goal: Risk for activity intolerance will decrease Outcome: Progressing   Problem: Nutrition: Goal: Adequate nutrition will be maintained Outcome: Progressing   Problem: Pain Managment: Goal: General experience of comfort will improve Outcome: Progressing   Problem: Safety: Goal: Ability to remain free from injury will improve Outcome: Progressing   

## 2020-07-07 NOTE — Plan of Care (Signed)
See progress notes. Will continue to monitor.

## 2020-07-07 NOTE — Progress Notes (Signed)
PT Cancellation Note  Patient Details Name: Wendy Erickson MRN: [id number] DOB: [birth year]   Cancelled Treatment:    Reason Eval/Treat Not Completed: (P) Medical issues which prohibited therapy (Hgb drop to 6.8 and pt to receive blood products.) Will continue efforts per PT POC as schedule permits once medically appropriate.   Leaf Kernodle M Shandricka Monroy 07/07/2020, 8:52 AM

## 2020-07-08 ENCOUNTER — Inpatient Hospital Stay (HOSPITAL_COMMUNITY): Payer: Medicare Other | Admitting: Anesthesiology

## 2020-07-08 ENCOUNTER — Encounter (HOSPITAL_COMMUNITY): Payer: Self-pay | Admitting: Internal Medicine

## 2020-07-08 ENCOUNTER — Encounter (HOSPITAL_COMMUNITY)
Admission: EM | Disposition: A | Discharge status: Skilled Nursing Facility | Payer: Self-pay | Source: Home / Self Care | Attending: Internal Medicine

## 2020-07-08 DIAGNOSIS — S72002A Fracture of unspecified part of neck of left femur, initial encounter for closed fracture: Secondary | ICD-10-CM

## 2020-07-08 DIAGNOSIS — I10 Essential (primary) hypertension: Secondary | ICD-10-CM

## 2020-07-08 DIAGNOSIS — G119 Hereditary ataxia, unspecified: Secondary | ICD-10-CM

## 2020-07-08 DIAGNOSIS — R001 Bradycardia, unspecified: Secondary | ICD-10-CM

## 2020-07-08 DIAGNOSIS — E785 Hyperlipidemia, unspecified: Secondary | ICD-10-CM

## 2020-07-08 HISTORY — PX: BIOPSY: SHX5522

## 2020-07-08 HISTORY — PX: ESOPHAGOGASTRODUODENOSCOPY: SHX5428

## 2020-07-08 LAB — BPAM RBC
Blood Product Expiration Date: 202201012359
ISSUE DATE / TIME: 202111301227
Unit Type and Rh: 8400

## 2020-07-08 LAB — BASIC METABOLIC PANEL
Anion gap: 12 (ref 5–15)
BUN: 24 mg/dL — ABNORMAL HIGH (ref 8–23)
CO2: 26 mmol/L (ref 22–32)
Calcium: 8.3 mg/dL — ABNORMAL LOW (ref 8.9–10.3)
Chloride: 100 mmol/L (ref 98–111)
Creatinine, Ser: 0.74 mg/dL (ref 0.44–1.00)
GFR, Estimated: >60 mL/min (ref >60)
Glucose, Bld: 103 mg/dL — ABNORMAL HIGH (ref 70–99)
Potassium: 3.4 mmol/L — ABNORMAL LOW (ref 3.5–5.1)
Sodium: 138 mmol/L (ref 135–145)

## 2020-07-08 LAB — TYPE AND SCREEN
ABO/RH(D): AB POS
Antibody Screen: NEGATIVE
Unit division: 0

## 2020-07-08 LAB — CBC
HCT: 25.1 % — ABNORMAL LOW (ref 36.0–46.0)
Hemoglobin: 7.9 g/dL — ABNORMAL LOW (ref 12.0–15.0)
MCH: 28.4 pg (ref 26.0–34.0)
MCHC: 31.5 g/dL (ref 30.0–36.0)
MCV: 90.3 fL (ref 80.0–100.0)
Platelets: 330 10*3/uL (ref 150–400)
RBC: 2.78 MIL/uL — ABNORMAL LOW (ref 3.87–5.11)
RDW: 17.6 % — ABNORMAL HIGH (ref 11.5–15.5)
WBC: 14.5 10*3/uL — ABNORMAL HIGH (ref 4.0–10.5)
nRBC: 0.8 % — ABNORMAL HIGH (ref 0.0–0.2)

## 2020-07-08 SURGERY — EGD (ESOPHAGOGASTRODUODENOSCOPY)
Anesthesia: Monitor Anesthesia Care

## 2020-07-08 MED ORDER — PANTOPRAZOLE SODIUM 40 MG IV SOLR
40.0000 mg | Freq: Two times a day (BID) | INTRAVENOUS | Status: DC
Start: 2020-07-08 — End: 2020-07-08

## 2020-07-08 MED ORDER — PANTOPRAZOLE SODIUM 40 MG PO TBEC: 40.0000 mg | DELAYED_RELEASE_TABLET | Freq: Two times a day (BID) | ORAL | Status: DC

## 2020-07-08 MED ORDER — LACTATED RINGERS IV SOLN: INTRAVENOUS | Status: DC | PRN

## 2020-07-08 MED ORDER — PANTOPRAZOLE SODIUM 40 MG IV SOLR: 40.0000 mg | Freq: Two times a day (BID) | INTRAVENOUS | Status: DC

## 2020-07-08 MED ORDER — PANTOPRAZOLE SODIUM 40 MG IV SOLR
40.0000 mg | Freq: Two times a day (BID) | INTRAVENOUS | Status: DC
  Administered 2020-07-08 – 2020-07-10 (×5): 40 mg via INTRAVENOUS
  Filled 2020-07-08 (×5): qty 40

## 2020-07-08 MED ORDER — HYDRALAZINE HCL 20 MG/ML IJ SOLN
10.0000 mg | Freq: Once | INTRAMUSCULAR | Status: AC
  Administered 2020-07-08: 10 mg via INTRAVENOUS

## 2020-07-08 MED ORDER — PROPOFOL 500 MG/50ML IV EMUL
INTRAVENOUS | Status: DC | PRN
  Administered 2020-07-08: 100 ug/kg/min via INTRAVENOUS

## 2020-07-08 MED ORDER — HYDRALAZINE HCL 20 MG/ML IJ SOLN
INTRAMUSCULAR | Status: AC
  Filled 2020-07-08: qty 1

## 2020-07-08 MED ORDER — PROPOFOL 10 MG/ML IV BOLUS
INTRAVENOUS | Status: DC | PRN
  Administered 2020-07-08: 20 mg via INTRAVENOUS

## 2020-07-08 NOTE — Progress Notes (Signed)
PT Cancellation Note  Patient Details Name: Wendy Erickson MRN: [id number] DOB: [birth year]   Cancelled Treatment:    Reason Eval/Treat Not Completed: (P) Patient at procedure or test/unavailable (pt off unit for Endoscopy.) Will continue efforts per PT POC as schedule permits.   Kara Pacer Aroush Chasse 07/08/2020, 11:31 AM

## 2020-07-08 NOTE — Transfer of Care (Signed)
Immediate Anesthesia Transfer of Care Note  Patient: Wendy Erickson  Procedure(s) Performed: ESOPHAGOGASTRODUODENOSCOPY (EGD) (N/A ) BIOPSY  Patient Location: Endoscopy Unit  Anesthesia Type:MAC  Level of Consciousness: awake, alert , oriented and patient cooperative  Airway & Oxygen Therapy: Patient Spontanous Breathing and Patient connected to nasal cannula oxygen  Post-op Assessment: Report given to RN and Post -op Vital signs reviewed and stable  Post vital signs: Reviewed and stable  Last Vitals:  Vitals Value Taken Time  BP 98/65 07/08/20 1234  Temp    Pulse    Resp 22 07/08/20 1236  SpO2    Vitals shown include unvalidated device data.  Last Pain:  Vitals:   07/08/20 1033  TempSrc: Oral  PainSc: 0-No pain      Patients Stated Pain Goal: 2 (97/02/63 7858)  Complications: No complications documented.

## 2020-07-08 NOTE — Anesthesia Postprocedure Evaluation (Signed)
Anesthesia Post Note  Patient: Wendy Erickson  Procedure(s) Performed: ESOPHAGOGASTRODUODENOSCOPY (EGD) (N/A ) BIOPSY     Patient location during evaluation: Endoscopy Anesthesia Type: MAC Level of consciousness: awake and alert Pain management: pain level controlled Vital Signs Assessment: post-procedure vital signs reviewed and stable Respiratory status: spontaneous breathing, nonlabored ventilation, respiratory function stable and patient connected to nasal cannula oxygen Cardiovascular status: stable and blood pressure returned to baseline Postop Assessment: no apparent nausea or vomiting Anesthetic complications: no   No complications documented.  Last Vitals:  Vitals:   07/08/20 1250 07/08/20 1306  BP: (!) 189/73 (!) 147/69  Pulse: 98 97  Resp: 19   Temp:  36.8 C  SpO2: 99%     Last Pain:  Vitals:   07/08/20 1306  TempSrc: Oral  PainSc:                  Wendy Erickson

## 2020-07-08 NOTE — Anesthesia Preprocedure Evaluation (Signed)
Anesthesia Evaluation  Patient identified by MRN, date of birth, ID band Patient awake    Reviewed: Patient's Chart, lab work & pertinent test results  Airway Mallampati: II  TM Distance: >3 FB Neck ROM: Full    Dental  (+) Partial Lower, Missing, Poor Dentition   Pulmonary neg pulmonary ROS, former smoker,    Pulmonary exam normal        Cardiovascular hypertension, Pt. on medications  Rhythm:Regular Rate:Normal     Neuro/Psych Depression Spinocerebellar ataxia type 3  Neuromuscular disease    GI/Hepatic Neg liver ROS, GERD  Medicated,  Endo/Other    Renal/GU CRFRenal diseaseLeft renal neoplasm  negative genitourinary   Musculoskeletal  (+) Arthritis ,   Abdominal (+)  Abdomen: soft. Bowel sounds: normal.  Peds  Hematology  (+) anemia ,   Anesthesia Other Findings   Reproductive/Obstetrics                             Anesthesia Physical Anesthesia Plan  ASA: III  Anesthesia Plan: MAC   Post-op Pain Management:    Induction: Intravenous  PONV Risk Score and Plan: 2 and Propofol infusion  Airway Management Planned: Simple Face Mask, Natural Airway and Nasal Cannula  Additional Equipment: None  Intra-op Plan:   Post-operative Plan:   Informed Consent: I have reviewed the patients History and Physical, chart, labs and discussed the procedure including the risks, benefits and alternatives for the proposed anesthesia with the patient or authorized representative who has indicated his/her understanding and acceptance.   Patient has DNR.  Discussed DNR with patient.   Dental advisory given  Plan Discussed with: CRNA  Anesthesia Plan Comments: (Lab Results      Component                Value               Date                      WBC                      14.5 (H)            07/08/2020                HGB                      7.9 (L)             07/08/2020                 HCT                      25.1 (L)            07/08/2020                MCV                      90.3                07/08/2020                PLT                      330  07/08/2020           Lab Results      Component                Value               Date                      NA                       138                 07/08/2020                K                        3.4 (L)             07/08/2020                CO2                      26                  07/08/2020                GLUCOSE                  103 (H)             07/08/2020                BUN                      24 (H)              07/08/2020                CREATININE               0.74                07/08/2020                CALCIUM                  8.3 (L)             07/08/2020                GFRNONAA                 >60                 07/08/2020                GFRAA                    >60                 06/27/2018          )        Anesthesia Quick Evaluation

## 2020-07-08 NOTE — Op Note (Signed)
[facility] Patient Name: Wendy Erickson Procedure Date : 07/08/2020 MRN: [id number] Attending MD: Clarene Essex , MD Date of Birth: [birth year] CSN: [id number] Age: 79 Admit Type: Inpatient Procedure:                Upper GI endoscopy Indications:              Melena Providers:                Clarene Essex, MD, Vista Lawman, RN, Cherylynn Ridges,                            Technician, Erskine Emery CRNA, Baird Cancer, RN Referring MD:              Medicines:                Propofol total dose 117 mg IV, 40 mg IV lidocaine Complications:            No immediate complications. Estimated Blood Loss:     Estimated blood loss: none. Procedure:                Pre-Anesthesia Assessment:                           - Prior to the procedure, a History and Physical                            was performed, and patient medications and                            allergies were reviewed. The patient's tolerance of                            previous anesthesia was also reviewed. The risks                            and benefits of the procedure and the sedation                            options and risks were discussed with the patient.                            All questions were answered, and informed consent                            was obtained. Prior Anticoagulants: The patient has                            taken Lovenox (enoxaparin), last dose was 2 days                            prior to procedure. ASA Grade Assessment: III - A                            patient with severe systemic disease. After  reviewing the risks and benefits, the patient was                            deemed in satisfactory condition to undergo the                            procedure.                           After obtaining informed consent, the endoscope was                            passed under direct vision. Throughout the                            procedure, the patient's  blood pressure, pulse, and                            oxygen saturations were monitored continuously. The                            GIF-H190 ([id number]) Olympus gastroscope was                            introduced through the mouth, and advanced to the                            third part of duodenum. The upper GI endoscopy was                            accomplished without difficulty. The patient                            tolerated the procedure well. Scope In: Scope Out: Findings:      The examined esophagus was normal.      The entire examined stomach was normal. Biopsies were taken with a cold       forceps for histology of antrum and fundus to rule out H. pylori.      Two non-bleeding cratered duodenal ulcers 1 with adherent clot were       found in the duodenal bulb and in the first portion of the duodenum.       Neither could be made to bleed with washing      A medium non-bleeding moderately inflamed and ulcerated diverticulum was       found in the first portion of the duodenum.      The second portion of the duodenum and third portion of the duodenum       were normal.      The exam was otherwise without abnormality. Impression:               - Normal esophagus.                           - Normal stomach. Biopsied.                           -  Non-bleeding duodenal ulcers with adherent clot.                           - Non-bleeding duodenal diverticulum.                           - Normal second portion of the duodenum and third                            portion of the duodenum.                           - The examination was otherwise normal. Recommendation:           - Full liquid diet today. If doing well tomorrow                            may have soft solids                           - No aspirin, ibuprofen, naproxen, or other                            non-steroidal anti-inflammatory drugs Long-term.                           - Await pathology results and treat H.  pylori                            electively if positive.                           - Return to GI clinic PRN. Twice daily pump                            inhibitors for 1 month and then once a day for 3                            months unless she requires an aspirin a day in                            which case I would continue her pump inhibitor                            long-term- Telephone GI clinic for pathology                            results in 1 week.                           - Telephone GI clinic if symptomatic PRN. Procedure Code(s):        --- Professional ---                           [phone], Esophagogastroduodenoscopy, flexible,  transoral; with biopsy, single or multiple Diagnosis Code(s):        --- Professional ---                           K26.4, Chronic or unspecified duodenal ulcer with                            hemorrhage                           K92.1, Melena (includes Hematochezia)                           K57.10, Diverticulosis of small intestine without                            perforation or abscess without bleeding CPT copyright 2019 [employer]. All rights reserved. The codes documented in this report are preliminary and upon coder review may  be revised to meet current compliance requirements. Clarene Essex, MD 07/08/2020 12:37:58 PM This report has been signed electronically. Number of Addenda: 0

## 2020-07-08 NOTE — Progress Notes (Signed)
Physical Therapy Treatment Patient Details Name: Wendy Erickson MRN: [id number] DOB: 1940/12/18 Today's Date: 07/08/2020    History of Present Illness Wendy Erickson is a 79 year old female with past medical history of hypertension, hyperlipidemia, peripheral neuropathy, left renal neoplasm, cerebellar ataxia and wheelchair-bound, on chronic 3 L of oxygen who presented to the hospital for a left femoral shaft fracture s/p IMN on 06/30/20.    PT Comments    Pt supine on arrival, agreeable to therapy session and with good participation and fair tolerance for mobility. Pt limited due to loose/tarry black BM during session, needing significant cleanup and quick to fatigue. Pt performed supine BLE A/AAROM therapeutic exercises as detailed below with fair tolerance. Of note, pt [place] partially out of nose at beginning of session and SpO2 85%, once 3L O2 [place] replaced properly SpO2 >92% within 1 minute and ear sensor replaced/O2 sensor alarm set at end of session for safety, RN notified. Pt continues to benefit from PT services to progress toward functional mobility goals. D/C recs below remain appropriate.     Follow Up Recommendations  SNF;Supervision/Assistance - 24 hour;Supervision for mobility/OOB     Equipment Recommendations  Other (comment) (has DME at facility)    Recommendations for Other Services       Precautions / Restrictions Precautions Precautions: Fall Restrictions Weight Bearing Restrictions: Yes LLE Weight Bearing: Weight bearing as tolerated    Mobility  Bed Mobility Overal bed mobility: Needs Assistance Bed Mobility: Rolling Rolling: Mod assist         General bed mobility comments: deferred EOB, pt had large black stool noticed after rolling and able to perform rolling x2 reps bilaterally with modA for hip rotation, able to reach cross-body and pull up on bed rails, once on side able to remain with modA at hips for peri-care; dependent for peri-care  Transfers                  General transfer comment: does not stand at baseline  Ambulation/Gait                 Stairs             Wheelchair Mobility    Modified Rankin (Stroke Patients Only)       Balance                                            Cognition Arousal/Alertness: Awake/alert Behavior During Therapy: WFL for tasks assessed/performed Overall Cognitive Status: No family/caregiver present to determine baseline cognitive functioning Area of Impairment: Attention;Following commands;Problem solving;Awareness;Safety/judgement;Memory                     Memory: Decreased short-term memory Following Commands: Follows one step commands with increased time     Problem Solving: Slow processing;Difficulty sequencing;Requires verbal cues;Requires tactile cues        Exercises General Exercises - Lower Extremity Ankle Circles/Pumps: AROM;Strengthening;Both;10 reps;Supine Quad Sets: AROM;Strengthening;Both;10 reps;Supine Gluteal Sets: AROM;Strengthening;Both;10 reps;Supine Heel Slides: AROM;Both;10 reps;Supine Supine BUE AROM: Shoulder flexion with deep breaths 1x10 reps   General Comments General comments (skin integrity, edema, etc.): at arrival pt was sleeping and 3L O2 [place] was half out of nose (only in 1 nostril), pt SpO2 85% taken on finger; [place] adjusted and improved to >95% per ear sensor which was placed during session within 1-2 minutes, improved pleth signal on ear  compared with fingertip; HR 98-106 bpm during session      Pertinent Vitals/Pain Pain Assessment: No/denies pain Pain Intervention(s): Monitored during session  See comments above  Home Living                      Prior Function            PT Goals (current goals can now be found in the care plan section) Acute Rehab PT Goals Patient Stated Goal: return to facility PT Goal Formulation: With patient Time For Goal Achievement: 07/15/20 Potential to Achieve  Goals: Fair Progress towards PT goals: Progressing toward goals    Frequency    Min 3X/week      PT Plan Current plan remains appropriate    Co-evaluation              AM-PAC PT "6 Clicks" Mobility   Outcome Measure  Help needed turning from your back to your side while in a flat bed without using bedrails?: A Lot Help needed moving from lying on your back to sitting on the side of a flat bed without using bedrails?: A Lot Help needed moving to and from a bed to a chair (including a wheelchair)?: Total Help needed standing up from a chair using your arms (e.g., wheelchair or bedside chair)?: Total Help needed to walk in hospital room?: Total Help needed climbing 3-5 steps with a railing? : Total 6 Click Score: 8    End of Session Equipment Utilized During Treatment: Oxygen Activity Tolerance: Patient tolerated treatment well Patient left: in bed;with call bell/phone within reach;with bed alarm set;with nursing/sitter in room (RN in room to assist with cleanup) Nurse Communication: Mobility status;Other (comment) (O2 sats) PT Visit Diagnosis: History of falling (Z91.81);Muscle weakness (generalized) (M62.81)     Time: 9485-4627 PT Time Calculation (min) (ACUTE ONLY): 26 min  Charges:  $Therapeutic Exercise: 8-22 mins $Therapeutic Activity: 8-22 mins                     Tynesia Harral P., PTA Acute Rehabilitation Services Pager: [phone] Office: [place] 07/08/2020, 4:19 PM

## 2020-07-08 NOTE — Progress Notes (Signed)
Wendy Erickson 10:36 AM  Subjective: Patient without any GI complaints and no signs of obvious bleeding we discussed her procedure  Objective: Vital signs stable afebrile exam please see preassessment evaluation hemoglobin stable chemistry okay  Assessment: GI bleed with melena  Plan: Okay to proceed with endoscopy with anesthesia assistance  [facility]  office [phone] After 5PM or if no answer call [phone]

## 2020-07-08 NOTE — Anesthesia Procedure Notes (Addendum)
Procedure Name: MAC Date/Time: 07/08/2020 12:13 PM Performed by: Renato Shin, CRNA Pre-anesthesia Checklist: Patient identified, Emergency Drugs available, Suction available, Patient being monitored and Timeout performed Patient Re-evaluated:Patient Re-evaluated prior to induction Oxygen Delivery Method: Nasal cannula

## 2020-07-08 NOTE — Progress Notes (Addendum)
HD#8 Subjective:  Overnight Events: no events overnight.  Patient resting comfortably in bed. Wendy Erickson denies any news symptoms today, but continues to have epigastric abdominal pain with palpation. Wendy Erickson states that Wendy Erickson tolerated the EGD well.   Objective:  Vital signs in last 24 hours: Vitals:   07/08/20 1230 07/08/20 1240 07/08/20 1250 07/08/20 1306  BP: 98/65 (!) 151/69 (!) 189/73 (!) 147/69  Pulse: 99 98 98 97  Resp: (!) 22 20 19    Temp: (!) 97.4 F (36.3 C)   98.2 F (36.8 C)  TempSrc: Tympanic   Oral  SpO2:  99% 99%   Weight:      Height:       Supplemental O2: Room Air SpO2: 99 %   Physical Exam:  Physical Exam Constitutional:      Appearance: Normal appearance.  HENT:     Head: Normocephalic and atraumatic.  Eyes:     Extraocular Movements: Extraocular movements intact.  Cardiovascular:     Rate and Rhythm: Normal rate.     Pulses: Normal pulses.     Heart sounds: Normal heart sounds.  Pulmonary:     Effort: Pulmonary effort is normal.     Breath sounds: Normal breath sounds.  Abdominal:     General: Bowel sounds are normal. There is no distension.     Palpations: Abdomen is soft.     Tenderness: There is abdominal tenderness (epigastric TTP).  Musculoskeletal:        General: Normal range of motion.     Cervical back: Normal range of motion.     Right lower leg: No edema.     Left lower leg: No edema.  Skin:    General: Skin is warm and dry.  Neurological:     Mental Status: Wendy Erickson is alert and oriented to person, place, and time. Mental status is at baseline.  Psychiatric:        Mood and Affect: Mood normal.     Filed Weights   06/30/20 1730 07/08/20 0400  Weight: 74.8 kg 88.2 kg     Intake/Output Summary (Last 24 hours) at 07/08/2020 1318 Last data filed at 07/08/2020 1300 Gross per 24 hour  Intake 1070.57 ml  Output 2300 ml  Net -1229.43 ml   Net IO Since Admission: 581.12 mL [07/08/20 1318]  Pertinent Labs: CBC Latest Ref Rng & Units  07/08/2020 07/07/2020 07/07/2020  WBC 4.0 - 10.5 K/uL 14.5(H) 16.1(H) 20.1(H)  Hemoglobin 12.0 - 15.0 g/dL 7.9(L) 7.9(L) 6.8(LL)  Hematocrit 36 - 46 % 25.1(L) 23.9(L) 21.9(L)  Platelets 150 - 400 K/uL 330 335 449(H)    CMP Latest Ref Rng & Units 07/08/2020 07/07/2020 07/05/2020  Glucose 70 - 99 mg/dL 103(H) - 100(H)  BUN 8 - 23 mg/dL 24(H) - 15  Creatinine 0.44 - 1.00 mg/dL 0.74 0.90 0.80  Sodium 135 - 145 mmol/L 138 - 138  Potassium 3.5 - 5.1 mmol/L 3.4(L) - 3.5  Chloride 98 - 111 mmol/L 100 - 102  CO2 22 - 32 mmol/L 26 - 25  Calcium 8.9 - 10.3 mg/dL 8.3(L) - 7.8(L)  Total Protein 6.5 - 8.1 g/dL - - -  Total Bilirubin 0.3 - 1.2 mg/dL - - -  Alkaline Phos 38 - 126 U/L - - -  AST 15 - 41 U/L - - -  ALT 0 - 44 U/L - - -    Imaging: No results found.  Assessment/Plan:   Principal Problem:   Femur fracture, left (HCC) Active Problems:  HTN (hypertension)   SCA-3 (spinocerebellar ataxia type 3) (HCC)   Abnormality of gait   Dysphagia, pharyngoesophageal phase   Trigeminal neuralgia of right side of face   Microcytic hypochromic anemia   Acute blood loss anemia   GI bleed    Patient Summary: Wendy Erickson is a 79 year old female with past medical history of hypertension, hyperlipidemia, peripheral neuropathy, left renal neoplasm, cerebellar ataxia and wheelchair-bound, on chronic 3 L of oxygen who presented to [facility] for a left femoral shaft fracture, status post ORIF  Left femur fracture s/p ORIF (11/23) Patient is status post ORIF with pain well controlled  - Pain well controlled on Tylenol 1 g 3 times daily and oxycodone 10 mg every 4 as needed.  There is no obvious signs of bleeding or infection around the wound. - Will hold off on ASA until hgb has stabilized.  Normocytic Anemia 2/2 to duodenal ulcer: Patient had EGD today showing nonbleeding duodenal ulcers with evidence of recent bleed. Biopsies pending for H pylori. Patient tolerated the procedure well.  -  Will start BID PPI - Continue liquid diet.  - Hgb is stable , but will recheck CBC tomorrow. - hold off on lovenox.  - Avoid NSAIDs   Chronic HTN HLD: - Continue lisinopril and amlodipine  - Continue atorvastatin   Diet: liquid IVF: None,None VTE: SCDs Code: DNR PT/OT recs: SNF for Subacute PT, none. TOC recs: SNF   Dispo: Anticipated discharge to Skilled nursing facility in 1 days pending stable Hgb and tolerate PO intake.   Marianna Payment, D.O. [facility], PGY-1 Pager: [phone], Phone: [phone] Date 07/08/2020 Time 1:27 PM  Please contact the on call pager after 5 pm and on weekends at [phone].

## 2020-07-08 NOTE — Hospital Course (Signed)
Feels hot, but denies chills. Denies SOB, palpitations or CP. States she still feels fatigue. Endorse some arm and abd pain. Informed patient about EGD today by GI.

## 2020-07-09 ENCOUNTER — Other Ambulatory Visit: Payer: Self-pay | Admitting: Physician Assistant

## 2020-07-09 ENCOUNTER — Encounter (HOSPITAL_COMMUNITY): Payer: Self-pay | Admitting: Gastroenterology

## 2020-07-09 LAB — CBC
HCT: 22 % — ABNORMAL LOW (ref 36.0–46.0)
Hemoglobin: 7 g/dL — ABNORMAL LOW (ref 12.0–15.0)
MCH: 28.6 pg (ref 26.0–34.0)
MCHC: 31.8 g/dL (ref 30.0–36.0)
MCV: 89.8 fL (ref 80.0–100.0)
Platelets: 344 10*3/uL (ref 150–400)
RBC: 2.45 MIL/uL — ABNORMAL LOW (ref 3.87–5.11)
RDW: 18.3 % — ABNORMAL HIGH (ref 11.5–15.5)
WBC: 13.3 10*3/uL — ABNORMAL HIGH (ref 4.0–10.5)
nRBC: 0.7 % — ABNORMAL HIGH (ref 0.0–0.2)

## 2020-07-09 LAB — RETICULOCYTES
Immature Retic Fract: 42.5 % — ABNORMAL HIGH (ref 2.3–15.9)
RBC.: 2.68 MIL/uL — ABNORMAL LOW (ref 3.87–5.11)
Retic Count, Absolute: 212.8 10*3/uL — ABNORMAL HIGH (ref 19.0–186.0)
Retic Ct Pct: 7.9 % — ABNORMAL HIGH (ref 0.4–3.1)

## 2020-07-09 LAB — BASIC METABOLIC PANEL
Anion gap: 11 (ref 5–15)
BUN: 10 mg/dL (ref 8–23)
CO2: 27 mmol/L (ref 22–32)
Calcium: 8.2 mg/dL — ABNORMAL LOW (ref 8.9–10.3)
Chloride: 98 mmol/L (ref 98–111)
Creatinine, Ser: 0.6 mg/dL (ref 0.44–1.00)
GFR, Estimated: >60 mL/min (ref >60)
Glucose, Bld: 100 mg/dL — ABNORMAL HIGH (ref 70–99)
Potassium: 3 mmol/L — ABNORMAL LOW (ref 3.5–5.1)
Sodium: 136 mmol/L (ref 135–145)

## 2020-07-09 LAB — FERRITIN: Ferritin: 73 ng/mL (ref 11–307)

## 2020-07-09 LAB — SURGICAL PATHOLOGY

## 2020-07-09 LAB — SARS CORONAVIRUS 2 BY RT PCR (HOSPITAL ORDER, PERFORMED IN ~~LOC~~ HOSPITAL LAB): SARS Coronavirus 2: NEGATIVE

## 2020-07-09 MED ORDER — POTASSIUM CHLORIDE CRYS ER 20 MEQ PO TBCR
40.0000 meq | EXTENDED_RELEASE_TABLET | Freq: Two times a day (BID) | ORAL | Status: DC
  Administered 2020-07-09 – 2020-07-10 (×4): 40 meq via ORAL
  Filled 2020-07-09 (×4): qty 2

## 2020-07-09 NOTE — Progress Notes (Signed)
HD#9 Subjective:  Overnight Events: no events overnight.   Patient resting comfortably in bed. She denies any new symptoms today. She is tolerating clear liquid diet. She denies any obvious signs of bleeding.   Objective:  Vital signs in last 24 hours: Vitals:   07/08/20 1306 07/08/20 1955 07/09/20 0534 07/09/20 0731  BP: (!) 147/69 (!) 190/61 (!) 186/67 (!) 173/65  Pulse: 97 90 85 69  Resp:   17 16  Temp: 98.2 F (36.8 C) 98.1 F (36.7 C) 98.1 F (36.7 C) 98.2 F (36.8 C)  TempSrc: Oral Oral Oral Oral  SpO2:  91% 95% 97%  Weight:      Height:       Supplemental O2: Room Air SpO2: 97 % O2 Flow Rate (L/min): 0   Physical Exam:  Physical Exam Constitutional:      Appearance: Normal appearance.  HENT:     Head: Normocephalic and atraumatic.  Eyes:     Extraocular Movements: Extraocular movements intact.  Cardiovascular:     Rate and Rhythm: Normal rate.     Pulses: Normal pulses.     Heart sounds: Murmur (blowing diastolic murmur) heard.   Pulmonary:     Effort: Pulmonary effort is normal.     Breath sounds: Normal breath sounds.  Abdominal:     General: Bowel sounds are normal.     Palpations: Abdomen is soft.     Tenderness: There is no abdominal tenderness.  Musculoskeletal:        General: Normal range of motion.     Cervical back: Normal range of motion.     Right lower leg: No edema.     Left lower leg: No edema.  Skin:    General: Skin is warm and dry.  Neurological:     Mental Status: She is alert and oriented to person, place, and time. Mental status is at baseline.  Psychiatric:        Mood and Affect: Mood normal.     Filed Weights   06/30/20 1730 07/08/20 0400  Weight: 74.8 kg 88.2 kg     Intake/Output Summary (Last 24 hours) at 07/09/2020 1453 Last data filed at 07/09/2020 0900 Gross per 24 hour  Intake 537.84 ml  Output 1500 ml  Net -962.16 ml   Net IO Since Admission: -381.04 mL [07/09/20 1453]  Pertinent Labs: CBC Latest  Ref Rng & Units 07/09/2020 07/08/2020 07/07/2020  WBC 4.0 - 10.5 K/uL 13.3(H) 14.5(H) 16.1(H)  Hemoglobin 12.0 - 15.0 g/dL 7.0(L) 7.9(L) 7.9(L)  Hematocrit 36 - 46 % 22.0(L) 25.1(L) 23.9(L)  Platelets 150 - 400 K/uL 344 330 335    CMP Latest Ref Rng & Units 07/09/2020 07/08/2020 07/07/2020  Glucose 70 - 99 mg/dL 100(H) 103(H) -  BUN 8 - 23 mg/dL 10 24(H) -  Creatinine 0.44 - 1.00 mg/dL 0.60 0.74 0.90  Sodium 135 - 145 mmol/L 136 138 -  Potassium 3.5 - 5.1 mmol/L 3.0(L) 3.4(L) -  Chloride 98 - 111 mmol/L 98 100 -  CO2 22 - 32 mmol/L 27 26 -  Calcium 8.9 - 10.3 mg/dL 8.2(L) 8.3(L) -  Total Protein 6.5 - 8.1 g/dL - - -  Total Bilirubin 0.3 - 1.2 mg/dL - - -  Alkaline Phos 38 - 126 U/L - - -  AST 15 - 41 U/L - - -  ALT 0 - 44 U/L - - -    Imaging: No results found.  Assessment/Plan:   Principal Problem:   Femur  fracture, left ([place]) Active Problems:   HTN (hypertension)   SCA-3 (spinocerebellar ataxia type 3) (HCC)   Abnormality of gait   Dysphagia, pharyngoesophageal phase   Trigeminal neuralgia of right side of face   Microcytic hypochromic anemia   Acute blood loss anemia   GI bleed   Patient Summary: Wendy Erickson is a 79 year old female with past medical history of hypertension, hyperlipidemia, peripheral neuropathy, left renal neoplasm, cerebellar ataxia and wheelchair-bound, on chronic 3 L of oxygen who presented to [facility] for a left femoral shaft fracture, status post ORIF  Left femur fracture s/p ORIF (11/23) Patient is status post ORIF with pain well controlled  - Pain well controlled on Tylenol 1 g 3 times daily and oxycodone 10 mg every 4 as needed. There is no obvious signs of bleeding or infection around the wound. - Will hold off on ASA until hgb has stabilized.  Normocytic Anemia 2/2 to duodenal ulcer: No obvious signs of further GI bleeding.  - Continue PPI BID, clear liquid diet and advance tomorrow. - Repeat CBC tomorrow to make sure Hgb is  stable. - Continue SCDs  And avoid NSAIDs  Chronic HTN HLD: - Continue lisinopril and amlodipine  - Continue atorvastatin  Diet: Liquid IVF: None,None VTE: SCDs Code: DNR PT/OT recs: SNF for Subacute PT, none. TOC recs: none   Dispo: Anticipated discharge to Nursing Home in 1 days pending tolerating PO intake.    Please contact the on call pager after 5 pm and on weekends at [phone].

## 2020-07-09 NOTE — Progress Notes (Signed)
Wendy Erickson 9:06 AM  Subjective: Patient without any GI complaints and no obvious signs of bleeding and no bowel movement yet today and we discussed how she can take Tylenol but avoid aspirin and nonsteroidals and answered all of her questions  Objective: Vital signs stable afebrile no acute distress abdomen is soft nontender hemoglobin slight drop  Assessment: Significant peptic ulcer disease  Plan: If no signs of bleeding later today okay to advance diet further and okay to use Tylenol as above and await biopsies to rule out H. pylori and if positive treat electively at some point in the future and happy to see back as an outpatient to determine if any further work-up and plans are needed [facility]  office [phone] After 5PM or if no answer call [phone]

## 2020-07-09 NOTE — Plan of Care (Signed)
  Problem: Education: Goal: Verbalization of understanding the information provided (i.e., activity precautions, restrictions, etc) will improve Outcome: Progressing Goal: Individualized Educational Video(s) Outcome: Progressing   Problem: Activity: Goal: Ability to ambulate and perform ADLs will improve Outcome: Progressing   Problem: Pain Management: Goal: Pain level will decrease Outcome: Progressing   Problem: Health Behavior/Discharge Planning: Goal: Ability to manage health-related needs will improve Outcome: Progressing   Problem: Clinical Measurements: Goal: Ability to maintain clinical measurements within normal limits will improve Outcome: Progressing Goal: Will remain free from infection Outcome: Progressing Goal: Diagnostic test results will improve Outcome: Progressing Goal: Respiratory complications will improve Outcome: Progressing Goal: Cardiovascular complication will be avoided Outcome: Progressing   Problem: Pain Managment: Goal: General experience of comfort will improve Outcome: Progressing   Problem: Elimination: Goal: Will not experience complications related to bowel motility Outcome: Progressing Goal: Will not experience complications related to urinary retention Outcome: Progressing

## 2020-07-10 LAB — CBC
HCT: 25.4 % — ABNORMAL LOW (ref 36.0–46.0)
Hemoglobin: 7.9 g/dL — ABNORMAL LOW (ref 12.0–15.0)
MCH: 28.8 pg (ref 26.0–34.0)
MCHC: 31.1 g/dL (ref 30.0–36.0)
MCV: 92.7 fL (ref 80.0–100.0)
Platelets: 372 10*3/uL (ref 150–400)
RBC: 2.74 MIL/uL — ABNORMAL LOW (ref 3.87–5.11)
RDW: 19.4 % — ABNORMAL HIGH (ref 11.5–15.5)
WBC: 13.1 10*3/uL — ABNORMAL HIGH (ref 4.0–10.5)
nRBC: 0.6 % — ABNORMAL HIGH (ref 0.0–0.2)

## 2020-07-10 LAB — BASIC METABOLIC PANEL
Anion gap: 12 (ref 5–15)
BUN: 11 mg/dL (ref 8–23)
CO2: 27 mmol/L (ref 22–32)
Calcium: 8.5 mg/dL — ABNORMAL LOW (ref 8.9–10.3)
Chloride: 100 mmol/L (ref 98–111)
Creatinine, Ser: 0.66 mg/dL (ref 0.44–1.00)
GFR, Estimated: >60 mL/min (ref >60)
Glucose, Bld: 92 mg/dL (ref 70–99)
Potassium: 4.3 mmol/L (ref 3.5–5.1)
Sodium: 139 mmol/L (ref 135–145)

## 2020-07-10 MED ORDER — ACETAMINOPHEN 500 MG PO TABS: 1000.0000 mg | ORAL_TABLET | Freq: Three times a day (TID) | ORAL | Status: DC | PRN

## 2020-07-10 MED ORDER — RIVAROXABAN 10 MG PO TABS: 10.0000 mg | ORAL_TABLET | Freq: Every day | ORAL | 0 refills | Status: DC

## 2020-07-10 MED ORDER — PANTOPRAZOLE SODIUM 40 MG PO TBEC: DELAYED_RELEASE_TABLET | ORAL | 0 refills | Status: DC

## 2020-07-10 MED ORDER — AMLODIPINE BESYLATE 5 MG PO TABS: 5.0000 mg | ORAL_TABLET | Freq: Every day | ORAL | 0 refills | Status: DC

## 2020-07-10 MED ORDER — OXYCODONE HCL 5 MG PO TABS
10.0000 mg | ORAL_TABLET | Freq: Four times a day (QID) | ORAL | Status: DC | PRN
  Administered 2020-07-10: 10 mg via ORAL
  Filled 2020-07-10: qty 2

## 2020-07-10 MED ORDER — ACETAMINOPHEN 500 MG PO TABS
1000.0000 mg | ORAL_TABLET | Freq: Three times a day (TID) | ORAL | Status: DC
  Administered 2020-07-10 (×2): 1000 mg via ORAL
  Filled 2020-07-10 (×2): qty 2

## 2020-07-10 MED ORDER — LISINOPRIL 5 MG PO TABS: 5.0000 mg | ORAL_TABLET | Freq: Every day | ORAL | 0 refills | Status: DC

## 2020-07-10 NOTE — TOC Transition Note (Signed)
Transition of Care [facility]) - CM/SW Discharge Note   Patient Details  Name: Wendy Erickson MRN: [id number] Date of Birth: 05/03/1941  [facility] CM/SW Contact:  Coralee Pesa, [place] Phone Number: 07/10/2020, 3:04 PM   Clinical Narrative:    Nurse to call report to [phone] Rm# [id number]  Final next level of care: Skilled Nursing Facility Barriers to Discharge: Barriers Resolved   Patient Goals and CMS Choice Patient states their goals for this hospitalization and ongoing recovery are:: return to [facility].gov Compare Post Acute Care list provided to:: Patient Choice offered to / list presented to : Patient  Discharge Placement              Patient chooses bed at: [facility] Patient to be transferred to facility by: [place] Name of family member notified: Everlene Other Patient and family notified of of transfer: 07/10/20  Discharge Plan and Services                                     Social Determinants of Health (SDOH) Interventions     Readmission Risk Interventions No flowsheet data found.

## 2020-07-10 NOTE — Progress Notes (Signed)
Staples removed from surgical incision per order. Dressing placed per order. Pt tolerated well. Total of 26 staples removed.

## 2020-07-10 NOTE — Progress Notes (Signed)
Report called to nurse Lancie.

## 2020-07-10 NOTE — Care Management Important Message (Signed)
Important Message  Patient Details  Name: Wendy Erickson MRN: [id number] Date of Birth: September 28, 1940   Medicare Important Message Given:  Yes     Barb Merino Herrick Hartog 07/10/2020, 3:05 PM

## 2020-07-10 NOTE — Progress Notes (Signed)
Physical Therapy Treatment Patient Details Name: Wendy Erickson MRN: [id number] DOB: [birth year] Today's Date: 07/10/2020    History of Present Illness Wendy Erickson is a 79 year old female with past medical history of hypertension, hyperlipidemia, peripheral neuropathy, left renal neoplasm, cerebellar ataxia and wheelchair-bound, on chronic 3 L of oxygen who presented to the hospital for a left femoral shaft fracture s/p IMN on 06/30/20.    PT Comments    Pt supine on arrival, agreeable to therapy session, and with good participation. Pt with improved activity tolerance this session, able to tolerate sitting EOB >5 minutes and requiring slightly less assist for seated balance than session earlier in week (min guard to modA) but reports increased LBP as seated time increases. Pt SpO2 not reading well on finger sensor however once ear sensor placed, SpO2 >94% on 3L O2 [place]. Pt performed seated/supine BLE A/AAROM therapeutic exercises and remained in sidelying with pillows to offload back/shoulder at end of session. Pt continues to benefit from skilled rehab in a post acute setting to maximize functional gains and improve seated tolerance at SNF.   Follow Up Recommendations  SNF;Supervision/Assistance - 24 hour;Supervision for mobility/OOB     Equipment Recommendations  Other (comment) (has DME at facility)    Recommendations for Other Services       Precautions / Restrictions Precautions Precautions: Fall Restrictions Weight Bearing Restrictions: Yes LLE Weight Bearing: Weight bearing as tolerated    Mobility  Bed Mobility Overal bed mobility: Needs Assistance Bed Mobility: Rolling Rolling: Min assist   Supine to sit: Mod assist;+2 for physical assistance Sit to supine: +2 for physical assistance;Max assist   General bed mobility comments: pt rolling well to L side but has increased L hip pain during transfer; pt able to pull body cross-body reaching to bed rail but needs modA for  bil hip translation to EOB and trunk support; +2 total A for posterior supine scoot with bed in trendelenburg  Transfers                Lateral/Scoot Transfers:  (pt encouraged to attempt but unable) General transfer comment: does not stand at baseline  Ambulation/Gait                 Stairs             Wheelchair Mobility    Modified Rankin (Stroke Patients Only)       Balance Overall balance assessment: Needs assistance Sitting-balance support: Feet supported;Bilateral upper extremity supported Sitting balance-Leahy Scale: Poor Sitting balance - Comments: pt c/o increased LBP seated EOB, tolerated sitting ~6 mins, able to weight shift trunk to L/R elbow with minA/cues, needs variable min guard to modA for trunk support balance due to core weakness/ataxia Postural control:  (leans L/R/posterior intermittently)                                  Cognition Arousal/Alertness: Awake/alert Behavior During Therapy: WFL for tasks assessed/performed Overall Cognitive Status: No family/caregiver present to determine baseline cognitive functioning Area of Impairment: Attention;Following commands;Problem solving;Awareness;Safety/judgement;Memory                     Memory: Decreased short-term memory Following Commands: Follows one step commands with increased time     Problem Solving: Slow processing;Difficulty sequencing;Requires verbal cues;Requires tactile cues        Exercises General Exercises - Lower Extremity Ankle Circles/Pumps: AROM;Strengthening;Both;10 reps;Supine Long Arc Quad: AROM;Both;10  reps;Seated Heel Slides: AROM;Both;10 reps;Supine Hip ABduction/ADduction: AAROM;Strengthening;Both;10 reps;Supine    General Comments General comments (skin integrity, edema, etc.): SpO2 taken on finger reading 85%, ear sensor placed and reading 95% on 3L O2 [place], RN notified, ear sensor remained on/plugged in for safety; HR 73 bpm       Pertinent Vitals/Pain Pain Assessment: Faces Faces Pain Scale: Hurts little more Pain Location: L hip when rolling onto L side for bed mobility and LBP Pain Descriptors / Indicators: Grimacing;Operative site guarding Pain Intervention(s): Monitored during session;Repositioned;Patient requesting pain meds-RN notified;Relaxation  See comments above.  Home Living                      Prior Function            PT Goals (current goals can now be found in the care plan section) Acute Rehab PT Goals Patient Stated Goal: return to facility PT Goal Formulation: With patient Time For Goal Achievement: 07/15/20 Potential to Achieve Goals: Fair Progress towards PT goals: Progressing toward goals    Frequency    Min 3X/week      PT Plan Current plan remains appropriate    Co-evaluation              AM-PAC PT "6 Clicks" Mobility   Outcome Measure  Help needed turning from your back to your side while in a flat bed without using bedrails?: A Little Help needed moving from lying on your back to sitting on the side of a flat bed without using bedrails?: A Lot Help needed moving to and from a bed to a chair (including a wheelchair)?: Total Help needed standing up from a chair using your arms (e.g., wheelchair or bedside chair)?: Total Help needed to walk in hospital room?: Total Help needed climbing 3-5 steps with a railing? : Total 6 Click Score: 9    End of Session Equipment Utilized During Treatment: Oxygen Activity Tolerance: Patient tolerated treatment well Patient left: in bed;with call bell/phone within reach;with bed alarm set (semi-sidelying with pillows behind R hip to offload) Nurse Communication: Mobility status PT Visit Diagnosis: History of falling (Z91.81);Muscle weakness (generalized) (M62.81)     Time: 9093-1121 PT Time Calculation (min) (ACUTE ONLY): 20 min  Charges:  $Therapeutic Exercise: 8-22 mins                     Romanda Turrubiates P., PTA Acute  Rehabilitation Services Pager: [phone] Office: Page 07/10/2020, 12:56 PM

## 2020-07-10 NOTE — Progress Notes (Signed)
Wendy Erickson 10:19 AM  Subjective: Patient doing well eating well in good spirits no signs of bleeding  Objective: Vital signs stable afebrile no acute distress abdomen is soft nontender hemoglobin stable pathology negative for H. pylori  Assessment: Peptic ulcer disease  Plan: Twice daily pump inhibitors for 1 month and can decrease to once a day and care with aspirin and nonsteroidals going forward and I am happy to see back as an outpatient to recheck labs guaiacs etc. and decide if any further work-up and plans are needed like possibly a one-time colonoscopy and please call us back if we can be of any further assistance with this hospital stay  [facility]  office [phone] After 5PM or if no answer call [phone]

## 2020-07-10 NOTE — Progress Notes (Signed)
HD#10 Subjective:  Overnight Events: No overnight events  Patient resting comfortably in bed.  Patient was able to tolerate soft food this morning.  She denies any new complaints at this time.  Objective:  Vital signs in last 24 hours: Vitals:   07/09/20 1322 07/09/20 1950 07/10/20 0500 07/10/20 0725  BP: 132/68 (!) 174/61 (!) 163/78 (!) 152/75  Pulse: 75 83 71 73  Resp: 17 18 18 16   Temp: 98.1 F (36.7 C) 98.4 F (36.9 C) 98.6 F (37 C) 98 F (36.7 C)  TempSrc: Oral Oral Oral Oral  SpO2: 97% 92% 96% 97%  Weight:      Height:       Supplemental O2: Nasal Cannula SpO2: 97 % O2 Flow Rate (L/min): 2 L/min   Physical Exam:  Physical Exam Constitutional:      Appearance: Normal appearance.  HENT:     Head: Normocephalic and atraumatic.  Eyes:     Extraocular Movements: Extraocular movements intact.  Cardiovascular:     Rate and Rhythm: Normal rate.     Pulses: Normal pulses.     Heart sounds: Normal heart sounds.  Pulmonary:     Effort: Pulmonary effort is normal.     Breath sounds: Normal breath sounds.  Abdominal:     General: Bowel sounds are normal.     Palpations: Abdomen is soft.     Tenderness: There is no abdominal tenderness.  Musculoskeletal:        General: Normal range of motion.     Cervical back: Normal range of motion.     Right lower leg: No edema.     Left lower leg: No edema.  Skin:    General: Skin is warm and dry.  Neurological:     Mental Status: She is alert and oriented to person, place, and time. Mental status is at baseline.  Psychiatric:        Mood and Affect: Mood normal.     Filed Weights   06/30/20 1730 07/08/20 0400  Weight: 74.8 kg 88.2 kg     Intake/Output Summary (Last 24 hours) at 07/10/2020 1215 Last data filed at 07/10/2020 0900 Gross per 24 hour  Intake 600 ml  Output 1425 ml  Net -825 ml   Net IO Since Admission: -1,206.04 mL [07/10/20 1215]  Pertinent Labs: CBC Latest Ref Rng & Units 07/10/2020  07/09/2020 07/08/2020  WBC 4.0 - 10.5 K/uL 13.1(H) 13.3(H) 14.5(H)  Hemoglobin 12.0 - 15.0 g/dL 7.9(L) 7.0(L) 7.9(L)  Hematocrit 36 - 46 % 25.4(L) 22.0(L) 25.1(L)  Platelets 150 - 400 K/uL 372 344 330    CMP Latest Ref Rng & Units 07/10/2020 07/09/2020 07/08/2020  Glucose 70 - 99 mg/dL 92 100(H) 103(H)  BUN 8 - 23 mg/dL 11 10 24(H)  Creatinine 0.44 - 1.00 mg/dL 0.66 0.60 0.74  Sodium 135 - 145 mmol/L 139 136 138  Potassium 3.5 - 5.1 mmol/L 4.3 3.0(L) 3.4(L)  Chloride 98 - 111 mmol/L 100 98 100  CO2 22 - 32 mmol/L 27 27 26   Calcium 8.9 - 10.3 mg/dL 8.5(L) 8.2(L) 8.3(L)  Total Protein 6.5 - 8.1 g/dL - - -  Total Bilirubin 0.3 - 1.2 mg/dL - - -  Alkaline Phos 38 - 126 U/L - - -  AST 15 - 41 U/L - - -  ALT 0 - 44 U/L - - -    Imaging: No results found.  Assessment/Plan:   Principal Problem:   Femur fracture, left (HCC) Active Problems:  HTN (hypertension)   SCA-3 (spinocerebellar ataxia type 3) (HCC)   Abnormality of gait   Dysphagia, pharyngoesophageal phase   Trigeminal neuralgia of right side of face   Microcytic hypochromic anemia   Acute blood loss anemia   GI bleed   Patient Summary: Wendy Erickson is a 79 year old female with past medical history of hypertension, hyperlipidemia, peripheral neuropathy, left renal neoplasm, cerebellar ataxia and wheelchair-bound, on chronic 3 L of oxygen who presented to [facility] for a left femoral shaft fracture, status post ORIF  Left femur fracture s/p ORIF (11/23) Patient is status post ORIF with pain well controlled -She is stable for discharge.  We will discharge her on home pain medicine of hydrocodone-acetaminophen 5-3 25 every 6 hours.  Patient's significant make adjustments to her pain medication as needed.  We will continue to hold off on ASA as is contraindicated in setting of upper GI bleed.  I will discharge her on Xarelto 10 mg daily for DVT prophylaxis over the course of 10 days.  -Continue home pain medication -Start  Xarelto 10 mg daily for 10 days once discharged  Normocytic Anemia2/2 to duodenal ulcer: Patient has no obvious signs of bleeding.  She denies any bloody bowel movements.  Hemoglobin has remained stable over the last 3 days.  Patient is tolerating PPI therapy and soft diet. - Continue PPI BID, clear liquid diet and advance tomorrow. -Avoid NSAIDs  Chronic HTN HLD: - Continue lisinopril and amlodipine  - Continue atorvastatin  Diet: Advance diet to normal diet. IVF: None,None VTE: SCDs Code: DNR PT/OT recs: SNF for Subacute PT, none. TOC recs: Discharged to SNF today   Dispo: Anticipated discharge to Skilled nursing facility today.   Please contact the on call pager after 5 pm and on weekends at [phone].

## 2020-07-11 NOTE — Progress Notes (Signed)
Pt transported to [facility] @ 2300- report was called by dayshift RN

## 2020-07-15 DIAGNOSIS — S72042A Displaced fracture of base of neck of left femur, initial encounter for closed fracture: Secondary | ICD-10-CM | POA: Diagnosis not present

## 2020-07-15 DIAGNOSIS — R296 Repeated falls: Secondary | ICD-10-CM | POA: Diagnosis not present

## 2020-07-15 DIAGNOSIS — F331 Major depressive disorder, recurrent, moderate: Secondary | ICD-10-CM | POA: Diagnosis not present

## 2020-07-15 DIAGNOSIS — M6281 Muscle weakness (generalized): Secondary | ICD-10-CM | POA: Diagnosis not present

## 2020-07-15 DIAGNOSIS — J9620 Acute and chronic respiratory failure, unspecified whether with hypoxia or hypercapnia: Secondary | ICD-10-CM | POA: Diagnosis not present

## 2020-07-17 DIAGNOSIS — R0902 Hypoxemia: Secondary | ICD-10-CM | POA: Diagnosis not present

## 2020-07-17 DIAGNOSIS — J9620 Acute and chronic respiratory failure, unspecified whether with hypoxia or hypercapnia: Secondary | ICD-10-CM | POA: Diagnosis not present

## 2020-07-22 DIAGNOSIS — F331 Major depressive disorder, recurrent, moderate: Secondary | ICD-10-CM | POA: Diagnosis not present

## 2020-07-22 DIAGNOSIS — J9620 Acute and chronic respiratory failure, unspecified whether with hypoxia or hypercapnia: Secondary | ICD-10-CM | POA: Diagnosis not present

## 2020-07-22 DIAGNOSIS — S72042A Displaced fracture of base of neck of left femur, initial encounter for closed fracture: Secondary | ICD-10-CM | POA: Diagnosis not present

## 2020-07-22 DIAGNOSIS — M6281 Muscle weakness (generalized): Secondary | ICD-10-CM | POA: Diagnosis not present

## 2020-07-27 DIAGNOSIS — Z4789 Encounter for other orthopedic aftercare: Secondary | ICD-10-CM | POA: Diagnosis not present

## 2020-07-27 DIAGNOSIS — F331 Major depressive disorder, recurrent, moderate: Secondary | ICD-10-CM | POA: Diagnosis not present

## 2020-07-29 DIAGNOSIS — R296 Repeated falls: Secondary | ICD-10-CM | POA: Diagnosis not present

## 2020-07-29 DIAGNOSIS — S72042A Displaced fracture of base of neck of left femur, initial encounter for closed fracture: Secondary | ICD-10-CM | POA: Diagnosis not present

## 2020-07-29 DIAGNOSIS — F331 Major depressive disorder, recurrent, moderate: Secondary | ICD-10-CM | POA: Diagnosis not present

## 2020-07-29 DIAGNOSIS — J9620 Acute and chronic respiratory failure, unspecified whether with hypoxia or hypercapnia: Secondary | ICD-10-CM | POA: Diagnosis not present

## 2020-08-10 DIAGNOSIS — K5900 Constipation, unspecified: Secondary | ICD-10-CM | POA: Diagnosis not present

## 2020-08-10 DIAGNOSIS — G4700 Insomnia, unspecified: Secondary | ICD-10-CM | POA: Diagnosis not present

## 2020-08-10 DIAGNOSIS — G894 Chronic pain syndrome: Secondary | ICD-10-CM | POA: Diagnosis not present

## 2020-08-10 DIAGNOSIS — I10 Essential (primary) hypertension: Secondary | ICD-10-CM | POA: Diagnosis not present

## 2020-08-11 DIAGNOSIS — M6281 Muscle weakness (generalized): Secondary | ICD-10-CM | POA: Diagnosis not present

## 2020-08-11 DIAGNOSIS — I10 Essential (primary) hypertension: Secondary | ICD-10-CM | POA: Diagnosis not present

## 2020-08-11 DIAGNOSIS — Z79899 Other long term (current) drug therapy: Secondary | ICD-10-CM | POA: Diagnosis not present

## 2020-08-11 DIAGNOSIS — R0902 Hypoxemia: Secondary | ICD-10-CM | POA: Diagnosis not present

## 2020-08-11 DIAGNOSIS — G629 Polyneuropathy, unspecified: Secondary | ICD-10-CM | POA: Diagnosis not present

## 2020-08-11 DIAGNOSIS — N390 Urinary tract infection, site not specified: Secondary | ICD-10-CM | POA: Diagnosis not present

## 2020-08-11 DIAGNOSIS — R279 Unspecified lack of coordination: Secondary | ICD-10-CM | POA: Diagnosis not present

## 2020-08-11 DIAGNOSIS — U071 COVID-19: Secondary | ICD-10-CM | POA: Diagnosis not present

## 2020-08-12 DIAGNOSIS — R300 Dysuria: Secondary | ICD-10-CM | POA: Diagnosis not present

## 2020-08-12 DIAGNOSIS — R82998 Other abnormal findings in urine: Secondary | ICD-10-CM | POA: Diagnosis not present

## 2020-08-12 DIAGNOSIS — F331 Major depressive disorder, recurrent, moderate: Secondary | ICD-10-CM | POA: Diagnosis not present

## 2020-08-12 DIAGNOSIS — N390 Urinary tract infection, site not specified: Secondary | ICD-10-CM | POA: Diagnosis not present

## 2020-08-17 DIAGNOSIS — F331 Major depressive disorder, recurrent, moderate: Secondary | ICD-10-CM | POA: Diagnosis not present

## 2020-08-21 DIAGNOSIS — N390 Urinary tract infection, site not specified: Secondary | ICD-10-CM | POA: Diagnosis not present

## 2020-08-21 DIAGNOSIS — R3915 Urgency of urination: Secondary | ICD-10-CM | POA: Diagnosis not present

## 2020-08-31 DIAGNOSIS — F331 Major depressive disorder, recurrent, moderate: Secondary | ICD-10-CM | POA: Diagnosis not present

## 2020-09-01 DIAGNOSIS — Z87440 Personal history of urinary (tract) infections: Secondary | ICD-10-CM | POA: Diagnosis not present

## 2020-09-01 DIAGNOSIS — R61 Generalized hyperhidrosis: Secondary | ICD-10-CM | POA: Diagnosis not present

## 2020-09-01 DIAGNOSIS — G4700 Insomnia, unspecified: Secondary | ICD-10-CM | POA: Diagnosis not present

## 2020-09-01 DIAGNOSIS — R638 Other symptoms and signs concerning food and fluid intake: Secondary | ICD-10-CM | POA: Diagnosis not present

## 2020-09-02 DIAGNOSIS — Z79899 Other long term (current) drug therapy: Secondary | ICD-10-CM | POA: Diagnosis not present

## 2020-09-08 DIAGNOSIS — Z4789 Encounter for other orthopedic aftercare: Secondary | ICD-10-CM | POA: Diagnosis not present

## 2020-09-09 DIAGNOSIS — F331 Major depressive disorder, recurrent, moderate: Secondary | ICD-10-CM | POA: Diagnosis not present

## 2020-09-14 DIAGNOSIS — F331 Major depressive disorder, recurrent, moderate: Secondary | ICD-10-CM | POA: Diagnosis not present

## 2020-09-15 DIAGNOSIS — E785 Hyperlipidemia, unspecified: Secondary | ICD-10-CM | POA: Diagnosis not present

## 2020-09-15 DIAGNOSIS — Z79899 Other long term (current) drug therapy: Secondary | ICD-10-CM | POA: Diagnosis not present

## 2020-09-15 DIAGNOSIS — I10 Essential (primary) hypertension: Secondary | ICD-10-CM | POA: Diagnosis not present

## 2020-09-22 DIAGNOSIS — R638 Other symptoms and signs concerning food and fluid intake: Secondary | ICD-10-CM | POA: Diagnosis not present

## 2020-09-22 DIAGNOSIS — R232 Flushing: Secondary | ICD-10-CM | POA: Diagnosis not present

## 2020-09-22 DIAGNOSIS — G629 Polyneuropathy, unspecified: Secondary | ICD-10-CM | POA: Diagnosis not present

## 2020-09-22 DIAGNOSIS — E119 Type 2 diabetes mellitus without complications: Secondary | ICD-10-CM | POA: Diagnosis not present

## 2020-09-23 DIAGNOSIS — E119 Type 2 diabetes mellitus without complications: Secondary | ICD-10-CM | POA: Diagnosis not present

## 2020-09-23 DIAGNOSIS — I10 Essential (primary) hypertension: Secondary | ICD-10-CM | POA: Diagnosis not present

## 2020-09-23 DIAGNOSIS — F064 Anxiety disorder due to known physiological condition: Secondary | ICD-10-CM | POA: Diagnosis not present

## 2020-09-23 DIAGNOSIS — E559 Vitamin D deficiency, unspecified: Secondary | ICD-10-CM | POA: Diagnosis not present

## 2020-09-23 DIAGNOSIS — F331 Major depressive disorder, recurrent, moderate: Secondary | ICD-10-CM | POA: Diagnosis not present

## 2020-09-23 DIAGNOSIS — D6489 Other specified anemias: Secondary | ICD-10-CM | POA: Diagnosis not present

## 2020-09-25 DIAGNOSIS — R067 Sneezing: Secondary | ICD-10-CM | POA: Diagnosis not present

## 2020-09-25 DIAGNOSIS — R0981 Nasal congestion: Secondary | ICD-10-CM | POA: Diagnosis not present

## 2020-09-25 DIAGNOSIS — I10 Essential (primary) hypertension: Secondary | ICD-10-CM | POA: Diagnosis not present

## 2020-09-25 DIAGNOSIS — J309 Allergic rhinitis, unspecified: Secondary | ICD-10-CM | POA: Diagnosis not present

## 2020-10-05 DIAGNOSIS — F331 Major depressive disorder, recurrent, moderate: Secondary | ICD-10-CM | POA: Diagnosis not present

## 2020-10-07 DIAGNOSIS — F064 Anxiety disorder due to known physiological condition: Secondary | ICD-10-CM | POA: Diagnosis not present

## 2020-10-07 DIAGNOSIS — F331 Major depressive disorder, recurrent, moderate: Secondary | ICD-10-CM | POA: Diagnosis not present

## 2020-10-08 DIAGNOSIS — L74519 Primary focal hyperhidrosis, unspecified: Secondary | ICD-10-CM | POA: Diagnosis not present

## 2020-10-08 DIAGNOSIS — J309 Allergic rhinitis, unspecified: Secondary | ICD-10-CM | POA: Diagnosis not present

## 2020-10-15 DIAGNOSIS — H6121 Impacted cerumen, right ear: Secondary | ICD-10-CM | POA: Diagnosis not present

## 2020-10-16 DIAGNOSIS — R3915 Urgency of urination: Secondary | ICD-10-CM | POA: Diagnosis not present

## 2020-10-17 DIAGNOSIS — E119 Type 2 diabetes mellitus without complications: Secondary | ICD-10-CM | POA: Diagnosis not present

## 2020-10-19 DIAGNOSIS — H6121 Impacted cerumen, right ear: Secondary | ICD-10-CM | POA: Diagnosis not present

## 2020-10-19 DIAGNOSIS — N390 Urinary tract infection, site not specified: Secondary | ICD-10-CM | POA: Diagnosis not present

## 2020-10-19 DIAGNOSIS — R61 Generalized hyperhidrosis: Secondary | ICD-10-CM | POA: Diagnosis not present

## 2020-10-21 DIAGNOSIS — F064 Anxiety disorder due to known physiological condition: Secondary | ICD-10-CM | POA: Diagnosis not present

## 2020-10-21 DIAGNOSIS — F331 Major depressive disorder, recurrent, moderate: Secondary | ICD-10-CM | POA: Diagnosis not present

## 2020-10-26 DIAGNOSIS — F331 Major depressive disorder, recurrent, moderate: Secondary | ICD-10-CM | POA: Diagnosis not present

## 2020-10-27 DIAGNOSIS — G629 Polyneuropathy, unspecified: Secondary | ICD-10-CM | POA: Diagnosis not present

## 2020-10-27 DIAGNOSIS — R232 Flushing: Secondary | ICD-10-CM | POA: Diagnosis not present

## 2020-10-27 DIAGNOSIS — E119 Type 2 diabetes mellitus without complications: Secondary | ICD-10-CM | POA: Diagnosis not present

## 2020-10-27 DIAGNOSIS — G894 Chronic pain syndrome: Secondary | ICD-10-CM | POA: Diagnosis not present

## 2020-11-03 DIAGNOSIS — G629 Polyneuropathy, unspecified: Secondary | ICD-10-CM | POA: Diagnosis not present

## 2020-11-03 DIAGNOSIS — F419 Anxiety disorder, unspecified: Secondary | ICD-10-CM | POA: Diagnosis not present

## 2020-11-04 DIAGNOSIS — F331 Major depressive disorder, recurrent, moderate: Secondary | ICD-10-CM | POA: Diagnosis not present

## 2020-11-04 DIAGNOSIS — F064 Anxiety disorder due to known physiological condition: Secondary | ICD-10-CM | POA: Diagnosis not present

## 2020-11-06 DIAGNOSIS — M25552 Pain in left hip: Secondary | ICD-10-CM | POA: Diagnosis not present

## 2020-11-11 DIAGNOSIS — E785 Hyperlipidemia, unspecified: Secondary | ICD-10-CM | POA: Diagnosis not present

## 2020-11-11 DIAGNOSIS — F339 Major depressive disorder, recurrent, unspecified: Secondary | ICD-10-CM | POA: Diagnosis not present

## 2020-11-11 DIAGNOSIS — F5101 Primary insomnia: Secondary | ICD-10-CM | POA: Diagnosis not present

## 2020-11-11 DIAGNOSIS — I10 Essential (primary) hypertension: Secondary | ICD-10-CM | POA: Diagnosis not present

## 2020-11-16 DIAGNOSIS — H6121 Impacted cerumen, right ear: Secondary | ICD-10-CM | POA: Diagnosis not present

## 2020-11-16 DIAGNOSIS — H9191 Unspecified hearing loss, right ear: Secondary | ICD-10-CM | POA: Diagnosis not present

## 2020-11-18 DIAGNOSIS — F331 Major depressive disorder, recurrent, moderate: Secondary | ICD-10-CM | POA: Diagnosis not present

## 2020-11-18 DIAGNOSIS — F064 Anxiety disorder due to known physiological condition: Secondary | ICD-10-CM | POA: Diagnosis not present

## 2020-11-30 DIAGNOSIS — F331 Major depressive disorder, recurrent, moderate: Secondary | ICD-10-CM | POA: Diagnosis not present

## 2020-12-02 DIAGNOSIS — F064 Anxiety disorder due to known physiological condition: Secondary | ICD-10-CM | POA: Diagnosis not present

## 2020-12-02 DIAGNOSIS — F331 Major depressive disorder, recurrent, moderate: Secondary | ICD-10-CM | POA: Diagnosis not present

## 2020-12-03 DIAGNOSIS — I10 Essential (primary) hypertension: Secondary | ICD-10-CM | POA: Diagnosis not present

## 2020-12-03 DIAGNOSIS — R232 Flushing: Secondary | ICD-10-CM | POA: Diagnosis not present

## 2020-12-04 DIAGNOSIS — H6122 Impacted cerumen, left ear: Secondary | ICD-10-CM | POA: Diagnosis not present

## 2020-12-04 DIAGNOSIS — H9192 Unspecified hearing loss, left ear: Secondary | ICD-10-CM | POA: Diagnosis not present

## 2020-12-05 DIAGNOSIS — R0602 Shortness of breath: Secondary | ICD-10-CM | POA: Diagnosis not present

## 2020-12-07 DIAGNOSIS — R232 Flushing: Secondary | ICD-10-CM | POA: Diagnosis not present

## 2020-12-07 DIAGNOSIS — I10 Essential (primary) hypertension: Secondary | ICD-10-CM | POA: Diagnosis not present

## 2020-12-07 DIAGNOSIS — J189 Pneumonia, unspecified organism: Secondary | ICD-10-CM | POA: Diagnosis not present

## 2020-12-11 DIAGNOSIS — I10 Essential (primary) hypertension: Secondary | ICD-10-CM | POA: Diagnosis not present

## 2020-12-11 DIAGNOSIS — R232 Flushing: Secondary | ICD-10-CM | POA: Diagnosis not present

## 2020-12-11 DIAGNOSIS — J189 Pneumonia, unspecified organism: Secondary | ICD-10-CM | POA: Diagnosis not present

## 2020-12-24 DIAGNOSIS — R443 Hallucinations, unspecified: Secondary | ICD-10-CM | POA: Diagnosis not present

## 2020-12-24 DIAGNOSIS — R270 Ataxia, unspecified: Secondary | ICD-10-CM | POA: Diagnosis not present

## 2020-12-24 DIAGNOSIS — Z87440 Personal history of urinary (tract) infections: Secondary | ICD-10-CM | POA: Diagnosis not present

## 2020-12-24 DIAGNOSIS — R300 Dysuria: Secondary | ICD-10-CM | POA: Diagnosis not present

## 2020-12-25 DIAGNOSIS — F419 Anxiety disorder, unspecified: Secondary | ICD-10-CM | POA: Diagnosis not present

## 2020-12-25 DIAGNOSIS — D649 Anemia, unspecified: Secondary | ICD-10-CM | POA: Diagnosis not present

## 2020-12-25 DIAGNOSIS — G629 Polyneuropathy, unspecified: Secondary | ICD-10-CM | POA: Diagnosis not present

## 2020-12-28 DIAGNOSIS — I10 Essential (primary) hypertension: Secondary | ICD-10-CM | POA: Diagnosis not present

## 2020-12-28 DIAGNOSIS — N3000 Acute cystitis without hematuria: Secondary | ICD-10-CM | POA: Diagnosis not present

## 2020-12-29 DIAGNOSIS — I10 Essential (primary) hypertension: Secondary | ICD-10-CM | POA: Diagnosis not present

## 2020-12-29 DIAGNOSIS — D649 Anemia, unspecified: Secondary | ICD-10-CM | POA: Diagnosis not present

## 2020-12-30 DIAGNOSIS — F064 Anxiety disorder due to known physiological condition: Secondary | ICD-10-CM | POA: Diagnosis not present

## 2020-12-30 DIAGNOSIS — F331 Major depressive disorder, recurrent, moderate: Secondary | ICD-10-CM | POA: Diagnosis not present

## 2020-12-31 DIAGNOSIS — I10 Essential (primary) hypertension: Secondary | ICD-10-CM | POA: Diagnosis not present

## 2020-12-31 DIAGNOSIS — R232 Flushing: Secondary | ICD-10-CM | POA: Diagnosis not present

## 2021-01-07 DIAGNOSIS — R443 Hallucinations, unspecified: Secondary | ICD-10-CM | POA: Diagnosis not present

## 2021-01-07 DIAGNOSIS — R232 Flushing: Secondary | ICD-10-CM | POA: Diagnosis not present

## 2021-01-07 DIAGNOSIS — I10 Essential (primary) hypertension: Secondary | ICD-10-CM | POA: Diagnosis not present

## 2021-01-07 DIAGNOSIS — R270 Ataxia, unspecified: Secondary | ICD-10-CM | POA: Diagnosis not present

## 2021-01-13 DIAGNOSIS — R3915 Urgency of urination: Secondary | ICD-10-CM | POA: Diagnosis not present

## 2021-01-13 DIAGNOSIS — F064 Anxiety disorder due to known physiological condition: Secondary | ICD-10-CM | POA: Diagnosis not present

## 2021-01-13 DIAGNOSIS — F331 Major depressive disorder, recurrent, moderate: Secondary | ICD-10-CM | POA: Diagnosis not present

## 2021-01-18 DIAGNOSIS — F331 Major depressive disorder, recurrent, moderate: Secondary | ICD-10-CM | POA: Diagnosis not present

## 2021-01-20 DIAGNOSIS — E039 Hypothyroidism, unspecified: Secondary | ICD-10-CM | POA: Diagnosis not present

## 2021-01-22 DIAGNOSIS — L749 Eccrine sweat disorder, unspecified: Secondary | ICD-10-CM | POA: Diagnosis not present

## 2021-01-22 DIAGNOSIS — F3341 Major depressive disorder, recurrent, in partial remission: Secondary | ICD-10-CM | POA: Diagnosis not present

## 2021-01-22 DIAGNOSIS — R232 Flushing: Secondary | ICD-10-CM | POA: Diagnosis not present

## 2021-01-29 DIAGNOSIS — F3341 Major depressive disorder, recurrent, in partial remission: Secondary | ICD-10-CM | POA: Diagnosis not present

## 2021-01-29 DIAGNOSIS — K5901 Slow transit constipation: Secondary | ICD-10-CM | POA: Diagnosis not present

## 2021-01-29 DIAGNOSIS — R232 Flushing: Secondary | ICD-10-CM | POA: Diagnosis not present

## 2021-01-29 DIAGNOSIS — L749 Eccrine sweat disorder, unspecified: Secondary | ICD-10-CM | POA: Diagnosis not present

## 2021-02-10 DIAGNOSIS — F064 Anxiety disorder due to known physiological condition: Secondary | ICD-10-CM | POA: Diagnosis not present

## 2021-02-10 DIAGNOSIS — F331 Major depressive disorder, recurrent, moderate: Secondary | ICD-10-CM | POA: Diagnosis not present

## 2021-02-12 DIAGNOSIS — N390 Urinary tract infection, site not specified: Secondary | ICD-10-CM | POA: Diagnosis not present

## 2021-02-15 DIAGNOSIS — L989 Disorder of the skin and subcutaneous tissue, unspecified: Secondary | ICD-10-CM | POA: Diagnosis not present

## 2021-02-15 DIAGNOSIS — L749 Eccrine sweat disorder, unspecified: Secondary | ICD-10-CM | POA: Diagnosis not present

## 2021-02-15 DIAGNOSIS — L819 Disorder of pigmentation, unspecified: Secondary | ICD-10-CM | POA: Diagnosis not present

## 2021-02-15 DIAGNOSIS — R232 Flushing: Secondary | ICD-10-CM | POA: Diagnosis not present

## 2021-02-18 DIAGNOSIS — G119 Hereditary ataxia, unspecified: Secondary | ICD-10-CM | POA: Diagnosis not present

## 2021-02-18 DIAGNOSIS — R200 Anesthesia of skin: Secondary | ICD-10-CM | POA: Diagnosis not present

## 2021-02-18 DIAGNOSIS — R519 Headache, unspecified: Secondary | ICD-10-CM | POA: Diagnosis not present

## 2021-02-18 DIAGNOSIS — G43019 Migraine without aura, intractable, without status migrainosus: Secondary | ICD-10-CM | POA: Diagnosis not present

## 2021-02-19 DIAGNOSIS — R200 Anesthesia of skin: Secondary | ICD-10-CM | POA: Diagnosis not present

## 2021-02-19 DIAGNOSIS — G43019 Migraine without aura, intractable, without status migrainosus: Secondary | ICD-10-CM | POA: Diagnosis not present

## 2021-02-19 DIAGNOSIS — G119 Hereditary ataxia, unspecified: Secondary | ICD-10-CM | POA: Diagnosis not present

## 2021-02-22 DIAGNOSIS — F411 Generalized anxiety disorder: Secondary | ICD-10-CM | POA: Diagnosis not present

## 2021-02-22 DIAGNOSIS — K5900 Constipation, unspecified: Secondary | ICD-10-CM | POA: Diagnosis not present

## 2021-02-22 DIAGNOSIS — F324 Major depressive disorder, single episode, in partial remission: Secondary | ICD-10-CM | POA: Diagnosis not present

## 2021-02-23 DIAGNOSIS — Z79899 Other long term (current) drug therapy: Secondary | ICD-10-CM | POA: Diagnosis not present

## 2021-02-23 DIAGNOSIS — R232 Flushing: Secondary | ICD-10-CM | POA: Diagnosis not present

## 2021-02-23 DIAGNOSIS — L749 Eccrine sweat disorder, unspecified: Secondary | ICD-10-CM | POA: Diagnosis not present

## 2021-02-23 DIAGNOSIS — F3341 Major depressive disorder, recurrent, in partial remission: Secondary | ICD-10-CM | POA: Diagnosis not present

## 2021-03-10 DIAGNOSIS — F064 Anxiety disorder due to known physiological condition: Secondary | ICD-10-CM | POA: Diagnosis not present

## 2021-03-10 DIAGNOSIS — F331 Major depressive disorder, recurrent, moderate: Secondary | ICD-10-CM | POA: Diagnosis not present

## 2021-03-12 DIAGNOSIS — C44319 Basal cell carcinoma of skin of other parts of face: Secondary | ICD-10-CM | POA: Diagnosis not present

## 2021-03-12 DIAGNOSIS — D485 Neoplasm of uncertain behavior of skin: Secondary | ICD-10-CM | POA: Diagnosis not present

## 2021-03-15 DIAGNOSIS — F331 Major depressive disorder, recurrent, moderate: Secondary | ICD-10-CM | POA: Diagnosis not present

## 2021-03-19 ENCOUNTER — Other Ambulatory Visit: Payer: Self-pay | Admitting: Internal Medicine

## 2021-03-19 ENCOUNTER — Other Ambulatory Visit: Payer: Self-pay | Admitting: *Deleted

## 2021-03-19 ENCOUNTER — Other Ambulatory Visit (HOSPITAL_COMMUNITY): Payer: Self-pay | Admitting: Internal Medicine

## 2021-03-24 ENCOUNTER — Other Ambulatory Visit (HOSPITAL_COMMUNITY): Payer: Self-pay | Admitting: Internal Medicine

## 2021-03-24 ENCOUNTER — Other Ambulatory Visit: Payer: Self-pay | Admitting: Internal Medicine

## 2021-03-25 ENCOUNTER — Other Ambulatory Visit: Payer: Self-pay | Admitting: *Deleted

## 2021-03-25 ENCOUNTER — Other Ambulatory Visit: Payer: Self-pay

## 2021-03-25 ENCOUNTER — Other Ambulatory Visit (HOSPITAL_COMMUNITY): Payer: Self-pay | Admitting: *Deleted

## 2021-03-25 DIAGNOSIS — R519 Headache, unspecified: Secondary | ICD-10-CM

## 2021-03-25 DIAGNOSIS — G8929 Other chronic pain: Secondary | ICD-10-CM

## 2021-03-29 DIAGNOSIS — I10 Essential (primary) hypertension: Secondary | ICD-10-CM | POA: Diagnosis not present

## 2021-03-29 DIAGNOSIS — Z79899 Other long term (current) drug therapy: Secondary | ICD-10-CM | POA: Diagnosis not present

## 2021-03-31 ENCOUNTER — Other Ambulatory Visit: Payer: Self-pay

## 2021-03-31 ENCOUNTER — Ambulatory Visit (HOSPITAL_COMMUNITY)
Admission: RE | Admit: 2021-03-31 | Discharge: 2021-03-31 | Disposition: A | Discharge status: Home / Self Care | Payer: Medicare Other | Source: Ambulatory Visit | Attending: Student | Admitting: Student

## 2021-03-31 DIAGNOSIS — R270 Ataxia, unspecified: Secondary | ICD-10-CM | POA: Diagnosis not present

## 2021-03-31 DIAGNOSIS — R519 Headache, unspecified: Secondary | ICD-10-CM | POA: Diagnosis not present

## 2021-03-31 DIAGNOSIS — G8929 Other chronic pain: Secondary | ICD-10-CM | POA: Diagnosis not present

## 2021-03-31 MED ORDER — GADOBUTROL 1 MMOL/ML IV SOLN
9.0000 mL | Freq: Once | INTRAVENOUS | Status: AC | PRN
  Administered 2021-03-31: 9 mL via INTRAVENOUS

## 2021-04-10 DIAGNOSIS — I10 Essential (primary) hypertension: Secondary | ICD-10-CM | POA: Diagnosis not present

## 2021-04-10 DIAGNOSIS — M47812 Spondylosis without myelopathy or radiculopathy, cervical region: Secondary | ICD-10-CM | POA: Diagnosis not present

## 2021-04-13 DIAGNOSIS — G119 Hereditary ataxia, unspecified: Secondary | ICD-10-CM | POA: Diagnosis not present

## 2021-04-13 DIAGNOSIS — M4802 Spinal stenosis, cervical region: Secondary | ICD-10-CM | POA: Diagnosis not present

## 2021-04-13 DIAGNOSIS — R202 Paresthesia of skin: Secondary | ICD-10-CM | POA: Diagnosis not present

## 2021-04-19 DIAGNOSIS — F331 Major depressive disorder, recurrent, moderate: Secondary | ICD-10-CM | POA: Diagnosis not present

## 2021-04-21 DIAGNOSIS — G119 Hereditary ataxia, unspecified: Secondary | ICD-10-CM | POA: Diagnosis not present

## 2021-04-21 DIAGNOSIS — I10 Essential (primary) hypertension: Secondary | ICD-10-CM | POA: Diagnosis not present

## 2021-04-21 DIAGNOSIS — M4802 Spinal stenosis, cervical region: Secondary | ICD-10-CM | POA: Diagnosis not present

## 2021-04-21 DIAGNOSIS — R202 Paresthesia of skin: Secondary | ICD-10-CM | POA: Diagnosis not present

## 2021-04-26 ENCOUNTER — Ambulatory Visit: Payer: Medicare Other | Admitting: Endocrinology

## 2021-05-05 DIAGNOSIS — M4802 Spinal stenosis, cervical region: Secondary | ICD-10-CM | POA: Diagnosis not present

## 2021-05-05 DIAGNOSIS — G119 Hereditary ataxia, unspecified: Secondary | ICD-10-CM | POA: Diagnosis not present

## 2021-05-05 DIAGNOSIS — I10 Essential (primary) hypertension: Secondary | ICD-10-CM | POA: Diagnosis not present

## 2021-05-05 DIAGNOSIS — I160 Hypertensive urgency: Secondary | ICD-10-CM | POA: Diagnosis not present

## 2021-05-12 DIAGNOSIS — R232 Flushing: Secondary | ICD-10-CM | POA: Diagnosis not present

## 2021-05-12 DIAGNOSIS — I10 Essential (primary) hypertension: Secondary | ICD-10-CM | POA: Diagnosis not present

## 2021-05-18 DIAGNOSIS — C44319 Basal cell carcinoma of skin of other parts of face: Secondary | ICD-10-CM | POA: Diagnosis not present

## 2021-05-18 DIAGNOSIS — L905 Scar conditions and fibrosis of skin: Secondary | ICD-10-CM | POA: Diagnosis not present

## 2021-05-24 DIAGNOSIS — F331 Major depressive disorder, recurrent, moderate: Secondary | ICD-10-CM | POA: Diagnosis not present

## 2021-05-31 DIAGNOSIS — R270 Ataxia, unspecified: Secondary | ICD-10-CM | POA: Diagnosis not present

## 2021-05-31 DIAGNOSIS — I10 Essential (primary) hypertension: Secondary | ICD-10-CM | POA: Diagnosis not present

## 2021-06-01 DIAGNOSIS — H6123 Impacted cerumen, bilateral: Secondary | ICD-10-CM | POA: Diagnosis not present

## 2021-06-02 ENCOUNTER — Ambulatory Visit (INDEPENDENT_AMBULATORY_CARE_PROVIDER_SITE_OTHER): Payer: Medicare Other | Admitting: Endocrinology

## 2021-06-02 ENCOUNTER — Other Ambulatory Visit: Payer: Self-pay

## 2021-06-02 DIAGNOSIS — R946 Abnormal results of thyroid function studies: Secondary | ICD-10-CM | POA: Diagnosis not present

## 2021-06-02 DIAGNOSIS — R200 Anesthesia of skin: Secondary | ICD-10-CM | POA: Diagnosis not present

## 2021-06-02 LAB — LUTEINIZING HORMONE: LH: 19.28 m[IU]/mL

## 2021-06-02 LAB — TSH: TSH: 2.14 u[IU]/mL (ref 0.35–5.50)

## 2021-06-02 LAB — T4, FREE: Free T4: 0.87 ng/dL (ref 0.60–1.60)

## 2021-06-02 LAB — VITAMIN B12: Vitamin B-12: 247 pg/mL (ref 211–911)

## 2021-06-02 NOTE — Progress Notes (Signed)
Subjective:    Patient ID: Wendy Erickson, female    DOB: 12/02/1940, 80 y.o.   MRN: [id number]  HPI Pt is referred by [facility] facility, for abnormal thyroid labs (dx'ed 2022).  She reports no h/o thyroid problem.  she has never been on prescribed thyroid hormone therapy.  she has never taken kelp or any other type of non-prescribed thyroid product.  she has never had thyroid imaging.  she has never had thyroid surgery, or XRT to the neck.  she has never been on amiodarone or lithium.  She has heat intolerance.  She has tremor, constipation, bilat foot numbness, and mild memory loss. 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 ([place])    Chronic kidney disease    left renal neoplasm   Depression    Dysphagia, pharyngoesophageal phase 12/05/2014   Foot fracture, right    Gait disorder    Heart murmur    High cholesterol    History of gout    Hypertension    Kidney malignancy (HCC)    left renal neoplasm   Osteopenia    Peripheral neuropathy    SCA-3 (spinocerebellar ataxia type 3) ([place]) 05/13/2013   Shingles    Right V1 distribution   Symptomatic anemia 06/21/2018   Trigeminal neuralgia of right side of face 06/09/2015   V1 distribution    Past Surgical History:  Procedure Laterality Date   BACK SURGERY     BIOPSY  07/08/2020   Procedure: BIOPSY;  Surgeon: Clarene Essex, MD;  Location: [place];  Service: Endoscopy;;   CATARACT EXTRACTION W/ INTRAOCULAR LENS IMPLANT Left    DILATION AND CURETTAGE OF UTERUS     ESOPHAGOGASTRODUODENOSCOPY N/A 07/08/2020   Procedure: ESOPHAGOGASTRODUODENOSCOPY (EGD);  Surgeon: Clarene Essex, MD;  Location: [place];  Service: Endoscopy;  Laterality: N/A;   FEMUR IM NAIL Left 06/30/2020   Procedure: INTRAMEDULLARY (IM) NAIL FEMORAL;  Surgeon: Nicholes Stairs, MD;  Location: [place];  Service: Orthopedics;  Laterality: Left;   KNEE  ARTHROSCOPY Bilateral    LAPAROSCOPIC NEPHRECTOMY  10/06/2011   Procedure: LAPAROSCOPIC NEPHRECTOMY;  Surgeon: Dutch Gray, MD;  Location: [facility];  Service: Urology;  Laterality: Left;  LEFT LAPAROSCOPIC RADICAL NEPHRECTOMY    LUMBAR [place] SURGERY     "herniated disc"   TONSILLECTOMY     VAGINAL HYSTERECTOMY      Social History   Socioeconomic History   Marital status: Single    Spouse name: Not on file   Number of children: 1   Years of education: college 2   Highest education level: Not on file  Occupational History   Occupation: retired    Fish farm manager: RETIRED  Tobacco Use   Smoking status: Former    Packs/day: 1.00    Years: 40.00    Pack years: 40.00    Types: Cigarettes    Quit date: 08/08/2005    Years since quitting: 15.8   Smokeless tobacco: Never  Vaping Use   Vaping Use: Never used  Substance and Sexual Activity   Alcohol use: No    Comment: wine on Friday   Drug use: No   Sexual activity: Not Currently  Other Topics Concern   Not on file  Social History Narrative   Patient is left handed.   Patient does not drink caffeine.   Resides at [facility].   Social Determinants of Health  Financial Resource Strain: Not on file  Food Insecurity: Not on file  Transportation Needs: Not on file  Physical Activity: Not on file  Stress: Not on file  Social Connections: Not on file  Intimate Partner Violence: Not on file    Current Outpatient Medications on File Prior to Visit  Medication Sig Dispense Refill   acetaminophen (TYLENOL) 325 MG tablet Take 650 mg by mouth every 6 (six) hours as needed for moderate pain or fever (max 10 in 24 hours).      atorvastatin (LIPITOR) 40 MG tablet Take 40 mg by mouth at bedtime.      Calcium Carbonate-Vitamin D 600-400 MG-UNIT tablet Take 1 tablet by mouth daily.      Cholecalciferol (VITAMIN D) 50 MCG (2000 UT) tablet Take 2,000 Units by mouth daily.     gabapentin (NEURONTIN) 300 MG capsule Take 1 capsule (300 mg  total) by mouth 3 (three) times daily. (Patient taking differently: Take 300 mg by mouth 4 (four) times daily.) 90 capsule 11   HYDROcodone-acetaminophen (NORCO/VICODIN) 5-325 MG tablet Take 2 tablets by mouth every 6 (six) hours as needed for moderate pain. 20 tablet 0   hydroxypropyl methylcellulose / hypromellose (ISOPTO TEARS / GONIOVISC) 2.5 % ophthalmic solution Place 1 drop into both eyes 3 (three) times daily as needed for dry eyes.     magnesium hydroxide (MILK OF MAGNESIA) 400 MG/5ML suspension Take 30 mLs by mouth daily as needed for mild constipation.     polycarbophil (FIBERCON) 625 MG tablet Take 625 mg by mouth 2 (two) times daily.      polyethylene glycol (MIRALAX) packet Take 17 g by mouth daily as needed for moderate constipation. 10 each 0   senna (SENOKOT) 8.6 MG tablet Take 1 tablet by mouth 2 (two) times daily.      sertraline (ZOLOFT) 100 MG tablet Take 150 mg by mouth daily. Take with 25 mg to equal 125 mg     amLODipine (NORVASC) 5 MG tablet Take 1 tablet (5 mg total) by mouth daily. 30 tablet 0   ferrous sulfate 325 (65 FE) MG EC tablet Take 1 tablet (325 mg total) by mouth 2 (two) times daily. (Patient taking differently: Take 325 mg by mouth 3 (three) times daily. ) 60 tablet 3   lisinopril (ZESTRIL) 5 MG tablet Take 1 tablet (5 mg total) by mouth daily. 30 tablet 0   pantoprazole (PROTONIX) 40 MG tablet Take 1 tablet (40 mg total) by mouth 2 (two) times daily for 30 days, THEN 1 tablet (40 mg total) daily. 150 tablet 0   rivaroxaban (XARELTO) 10 MG TABS tablet Take 1 tablet (10 mg total) by mouth daily for 10 days. 10 tablet 0   No current facility-administered medications on file prior to visit.    Allergies  Allergen Reactions   P[place]     Family History  Problem Relation Age of Onset   Ataxia Father    Dementia Mother     BP 140/62 (BP Location: Right Arm, Patient Position: Sitting, Cuff Size: Large)   Pulse (!) 57   SpO2 93%    Review of  Systems denies weight gain, and dry skin.      Objective:   Physical Exam VS: see vs page GEN: no distress.  In [place].  Has 02 on HEAD: head: no deformity eyes: no periorbital swelling, no proptosis external nose and ears are normal NECK: supple, thyroid is not enlarged CHEST WALL: no deformity LUNGS: clear to auscultation  CV: reg rate and rhythm; soft syst murmur.  MUSCULOSKELETAL: gait is normal and steady EXTEMITIES: no deformity.  1+ bilat leg edema NEURO:  readily moves all 4's.  sensation is intact to touch on all 4's.  Slight fine tremor of the hands SKIN:  Normal texture and temperature.  No rash or suspicious lesion is visible.   NODES:  None palpable at the neck.   PSYCH: alert, cooperative.  Does not appear anxious nor depressed.     TSH=1.34 Free T4=0.92  LH=19 Lab Results  Component Value Date   TSH 2.14 06/02/2021   I have reviewed outside records, and summarized: Pt was noted to have elevated A1c, and referred here.  She was admitted 2021 for femoral fracture,      Assessment & Plan:  Hypothyroxinemia, new to me, normal on recheck.  Patient Instructions  Blood tests are requested for you today.  We'll let you know about the results.  If these are normal, no thyroid treatment is needed. I would be happy to see you back here as needed

## 2021-06-02 NOTE — Patient Instructions (Signed)
Blood tests are requested for you today.  We'll let you know about the results.  If these are normal, no thyroid treatment is needed. I would be happy to see you back here as needed

## 2021-06-07 DIAGNOSIS — R001 Bradycardia, unspecified: Secondary | ICD-10-CM | POA: Diagnosis not present

## 2021-06-07 DIAGNOSIS — I952 Hypotension due to drugs: Secondary | ICD-10-CM | POA: Diagnosis not present

## 2021-06-08 DIAGNOSIS — R300 Dysuria: Secondary | ICD-10-CM | POA: Diagnosis not present

## 2021-06-08 DIAGNOSIS — N3000 Acute cystitis without hematuria: Secondary | ICD-10-CM | POA: Diagnosis not present

## 2021-06-08 DIAGNOSIS — N390 Urinary tract infection, site not specified: Secondary | ICD-10-CM | POA: Diagnosis not present

## 2021-06-09 ENCOUNTER — Telehealth: Payer: Self-pay

## 2021-06-09 NOTE — Telephone Encounter (Signed)
OV notes from 06/02/2021 internally faxed to [facility] @ [phone]

## 2021-06-10 DIAGNOSIS — R443 Hallucinations, unspecified: Secondary | ICD-10-CM | POA: Diagnosis not present

## 2021-06-10 DIAGNOSIS — R300 Dysuria: Secondary | ICD-10-CM | POA: Diagnosis not present

## 2021-06-10 DIAGNOSIS — N3000 Acute cystitis without hematuria: Secondary | ICD-10-CM | POA: Diagnosis not present

## 2021-06-18 DIAGNOSIS — R0602 Shortness of breath: Secondary | ICD-10-CM | POA: Diagnosis not present

## 2021-06-18 DIAGNOSIS — J029 Acute pharyngitis, unspecified: Secondary | ICD-10-CM | POA: Diagnosis not present

## 2021-06-18 DIAGNOSIS — J90 Pleural effusion, not elsewhere classified: Secondary | ICD-10-CM | POA: Diagnosis not present

## 2021-06-18 DIAGNOSIS — R051 Acute cough: Secondary | ICD-10-CM | POA: Diagnosis not present

## 2021-06-20 ENCOUNTER — Inpatient Hospital Stay (HOSPITAL_COMMUNITY)
Admission: EM | Admit: 2021-06-20 | Discharge: 2021-06-28 | DRG: 193 | Disposition: A | Discharge status: Skilled Nursing Facility | Payer: Medicare Other | Source: Skilled Nursing Facility | Attending: Family Medicine | Admitting: Family Medicine

## 2021-06-20 ENCOUNTER — Other Ambulatory Visit: Payer: Self-pay

## 2021-06-20 ENCOUNTER — Inpatient Hospital Stay (HOSPITAL_COMMUNITY): Payer: Medicare Other

## 2021-06-20 ENCOUNTER — Emergency Department (HOSPITAL_COMMUNITY): Payer: Medicare Other

## 2021-06-20 ENCOUNTER — Encounter (HOSPITAL_COMMUNITY): Payer: Self-pay | Admitting: Internal Medicine

## 2021-06-20 DIAGNOSIS — G119 Hereditary ataxia, unspecified: Secondary | ICD-10-CM | POA: Diagnosis not present

## 2021-06-20 DIAGNOSIS — I080 Rheumatic disorders of both mitral and aortic valves: Secondary | ICD-10-CM | POA: Diagnosis present

## 2021-06-20 DIAGNOSIS — J189 Pneumonia, unspecified organism: Secondary | ICD-10-CM | POA: Diagnosis not present

## 2021-06-20 DIAGNOSIS — D509 Iron deficiency anemia, unspecified: Secondary | ICD-10-CM | POA: Diagnosis not present

## 2021-06-20 DIAGNOSIS — Z993 Dependence on wheelchair: Secondary | ICD-10-CM | POA: Diagnosis not present

## 2021-06-20 DIAGNOSIS — J811 Chronic pulmonary edema: Secondary | ICD-10-CM | POA: Diagnosis present

## 2021-06-20 DIAGNOSIS — Z9981 Dependence on supplemental oxygen: Secondary | ICD-10-CM | POA: Diagnosis not present

## 2021-06-20 DIAGNOSIS — F32A Depression, unspecified: Secondary | ICD-10-CM | POA: Diagnosis present

## 2021-06-20 DIAGNOSIS — J9621 Acute and chronic respiratory failure with hypoxia: Secondary | ICD-10-CM | POA: Diagnosis not present

## 2021-06-20 DIAGNOSIS — J984 Other disorders of lung: Secondary | ICD-10-CM | POA: Diagnosis present

## 2021-06-20 DIAGNOSIS — J9601 Acute respiratory failure with hypoxia: Secondary | ICD-10-CM

## 2021-06-20 DIAGNOSIS — E7800 Pure hypercholesterolemia, unspecified: Secondary | ICD-10-CM | POA: Diagnosis present

## 2021-06-20 DIAGNOSIS — I213 ST elevation (STEMI) myocardial infarction of unspecified site: Secondary | ICD-10-CM | POA: Diagnosis not present

## 2021-06-20 DIAGNOSIS — I130 Hypertensive heart and chronic kidney disease with heart failure and stage 1 through stage 4 chronic kidney disease, or unspecified chronic kidney disease: Secondary | ICD-10-CM | POA: Diagnosis not present

## 2021-06-20 DIAGNOSIS — G500 Trigeminal neuralgia: Secondary | ICD-10-CM | POA: Diagnosis present

## 2021-06-20 DIAGNOSIS — I5031 Acute diastolic (congestive) heart failure: Secondary | ICD-10-CM | POA: Diagnosis present

## 2021-06-20 DIAGNOSIS — F419 Anxiety disorder, unspecified: Secondary | ICD-10-CM | POA: Diagnosis present

## 2021-06-20 DIAGNOSIS — I10 Essential (primary) hypertension: Secondary | ICD-10-CM | POA: Diagnosis present

## 2021-06-20 DIAGNOSIS — D649 Anemia, unspecified: Secondary | ICD-10-CM | POA: Diagnosis present

## 2021-06-20 DIAGNOSIS — G629 Polyneuropathy, unspecified: Secondary | ICD-10-CM | POA: Diagnosis present

## 2021-06-20 DIAGNOSIS — Z905 Acquired absence of kidney: Secondary | ICD-10-CM | POA: Diagnosis not present

## 2021-06-20 DIAGNOSIS — G118 Other hereditary ataxias: Secondary | ICD-10-CM | POA: Diagnosis not present

## 2021-06-20 DIAGNOSIS — Z87891 Personal history of nicotine dependence: Secondary | ICD-10-CM | POA: Diagnosis not present

## 2021-06-20 DIAGNOSIS — R1314 Dysphagia, pharyngoesophageal phase: Secondary | ICD-10-CM | POA: Diagnosis present

## 2021-06-20 DIAGNOSIS — R0902 Hypoxemia: Secondary | ICD-10-CM | POA: Diagnosis not present

## 2021-06-20 DIAGNOSIS — Z66 Do not resuscitate: Secondary | ICD-10-CM | POA: Diagnosis present

## 2021-06-20 DIAGNOSIS — R471 Dysarthria and anarthria: Secondary | ICD-10-CM | POA: Diagnosis present

## 2021-06-20 DIAGNOSIS — N1831 Chronic kidney disease, stage 3a: Secondary | ICD-10-CM | POA: Diagnosis not present

## 2021-06-20 DIAGNOSIS — R404 Transient alteration of awareness: Secondary | ICD-10-CM | POA: Diagnosis not present

## 2021-06-20 DIAGNOSIS — G709 Myoneural disorder, unspecified: Secondary | ICD-10-CM | POA: Diagnosis present

## 2021-06-20 DIAGNOSIS — R410 Disorientation, unspecified: Secondary | ICD-10-CM | POA: Diagnosis not present

## 2021-06-20 DIAGNOSIS — Z20822 Contact with and (suspected) exposure to covid-19: Secondary | ICD-10-CM | POA: Diagnosis not present

## 2021-06-20 DIAGNOSIS — D72829 Elevated white blood cell count, unspecified: Secondary | ICD-10-CM | POA: Diagnosis not present

## 2021-06-20 DIAGNOSIS — N179 Acute kidney failure, unspecified: Secondary | ICD-10-CM | POA: Diagnosis present

## 2021-06-20 DIAGNOSIS — R911 Solitary pulmonary nodule: Secondary | ICD-10-CM | POA: Diagnosis present

## 2021-06-20 DIAGNOSIS — Z7401 Bed confinement status: Secondary | ICD-10-CM

## 2021-06-20 DIAGNOSIS — Z9842 Cataract extraction status, left eye: Secondary | ICD-10-CM

## 2021-06-20 DIAGNOSIS — Z79899 Other long term (current) drug therapy: Secondary | ICD-10-CM

## 2021-06-20 LAB — CBC WITH DIFFERENTIAL/PLATELET
Abs Immature Granulocytes: 0.03 10*3/uL (ref 0.00–0.07)
Basophils Absolute: 0 10*3/uL (ref 0.0–0.1)
Basophils Relative: 0 %
Eosinophils Absolute: 0.2 10*3/uL (ref 0.0–0.5)
Eosinophils Relative: 2 %
HCT: 37.9 % (ref 36.0–46.0)
Hemoglobin: 11.8 g/dL — ABNORMAL LOW (ref 12.0–15.0)
Immature Granulocytes: 0 %
Lymphocytes Relative: 27 %
Lymphs Abs: 3.2 10*3/uL (ref 0.7–4.0)
MCH: 30.6 pg (ref 26.0–34.0)
MCHC: 31.1 g/dL (ref 30.0–36.0)
MCV: 98.4 fL (ref 80.0–100.0)
Monocytes Absolute: 0.4 10*3/uL (ref 0.1–1.0)
Monocytes Relative: 3 %
Neutro Abs: 7.9 10*3/uL — ABNORMAL HIGH (ref 1.7–7.7)
Neutrophils Relative %: 68 %
Platelets: 324 10*3/uL (ref 150–400)
RBC: 3.85 MIL/uL — ABNORMAL LOW (ref 3.87–5.11)
RDW: 14.9 % (ref 11.5–15.5)
WBC: 11.8 10*3/uL — ABNORMAL HIGH (ref 4.0–10.5)
nRBC: 0 % (ref 0.0–0.2)

## 2021-06-20 LAB — HIV ANTIBODY (ROUTINE TESTING W REFLEX): HIV Screen 4th Generation wRfx: NONREACTIVE

## 2021-06-20 LAB — I-STAT VENOUS BLOOD GAS, ED
Acid-Base Excess: 15 mmol/L — ABNORMAL HIGH (ref 0.0–2.0)
Bicarbonate: 41.8 mmol/L — ABNORMAL HIGH (ref 20.0–28.0)
Calcium, Ion: 1.13 mmol/L — ABNORMAL LOW (ref 1.15–1.40)
HCT: 35 % — ABNORMAL LOW (ref 36.0–46.0)
Hemoglobin: 11.9 g/dL — ABNORMAL LOW (ref 12.0–15.0)
O2 Saturation: 99 %
Potassium: 3.8 mmol/L (ref 3.5–5.1)
Sodium: 140 mmol/L (ref 135–145)
TCO2: 44 mmol/L — ABNORMAL HIGH (ref 22–32)
pCO2, Ven: 58.3 mmHg (ref 44.0–60.0)
pH, Ven: 7.463 — ABNORMAL HIGH (ref 7.250–7.430)
pO2, Ven: 125 mmHg — ABNORMAL HIGH (ref 32.0–45.0)

## 2021-06-20 LAB — URINALYSIS, ROUTINE W REFLEX MICROSCOPIC
Bilirubin Urine: NEGATIVE
Glucose, UA: NEGATIVE mg/dL
Ketones, ur: NEGATIVE mg/dL
Nitrite: POSITIVE — AB
Protein, ur: 100 mg/dL — AB
Specific Gravity, Urine: 1.012 (ref 1.005–1.030)
WBC, UA: >50 WBC/hpf — ABNORMAL HIGH (ref 0–5)
pH: 6 (ref 5.0–8.0)

## 2021-06-20 LAB — COMPREHENSIVE METABOLIC PANEL
ALT: 14 U/L (ref 0–44)
AST: 18 U/L (ref 15–41)
Albumin: 3.3 g/dL — ABNORMAL LOW (ref 3.5–5.0)
Alkaline Phosphatase: 69 U/L (ref 38–126)
Anion gap: 13 (ref 5–15)
BUN: 33 mg/dL — ABNORMAL HIGH (ref 8–23)
CO2: 34 mmol/L — ABNORMAL HIGH (ref 22–32)
Calcium: 9.8 mg/dL (ref 8.9–10.3)
Chloride: 94 mmol/L — ABNORMAL LOW (ref 98–111)
Creatinine, Ser: 1.05 mg/dL — ABNORMAL HIGH (ref 0.44–1.00)
GFR, Estimated: 54 mL/min — ABNORMAL LOW (ref >60)
Glucose, Bld: 127 mg/dL — ABNORMAL HIGH (ref 70–99)
Potassium: 3.6 mmol/L (ref 3.5–5.1)
Sodium: 141 mmol/L (ref 135–145)
Total Bilirubin: 0.5 mg/dL (ref 0.3–1.2)
Total Protein: 6.6 g/dL (ref 6.5–8.1)

## 2021-06-20 LAB — I-STAT CHEM 8, ED
BUN: 40 mg/dL — ABNORMAL HIGH (ref 8–23)
Calcium, Ion: 1.15 mmol/L (ref 1.15–1.40)
Chloride: 97 mmol/L — ABNORMAL LOW (ref 98–111)
Creatinine, Ser: 1.2 mg/dL — ABNORMAL HIGH (ref 0.44–1.00)
Glucose, Bld: 128 mg/dL — ABNORMAL HIGH (ref 70–99)
HCT: 36 % (ref 36.0–46.0)
Hemoglobin: 12.2 g/dL (ref 12.0–15.0)
Potassium: 3.8 mmol/L (ref 3.5–5.1)
Sodium: 142 mmol/L (ref 135–145)
TCO2: 38 mmol/L — ABNORMAL HIGH (ref 22–32)

## 2021-06-20 LAB — PROCALCITONIN: Procalcitonin: 0.12 ng/mL

## 2021-06-20 LAB — BRAIN NATRIURETIC PEPTIDE: B Natriuretic Peptide: 23 pg/mL (ref 0.0–100.0)

## 2021-06-20 LAB — TROPONIN I (HIGH SENSITIVITY)
Troponin I (High Sensitivity): 10 ng/L (ref <18)
Troponin I (High Sensitivity): 6 ng/L (ref <18)

## 2021-06-20 LAB — TSH: TSH: 2.435 u[IU]/mL (ref 0.350–4.500)

## 2021-06-20 LAB — D-DIMER, QUANTITATIVE: D-Dimer, Quant: 0.8 ug/mL-FEU — ABNORMAL HIGH (ref 0.00–0.50)

## 2021-06-20 LAB — MAGNESIUM: Magnesium: 1.9 mg/dL (ref 1.7–2.4)

## 2021-06-20 MED ORDER — ALBUTEROL SULFATE (2.5 MG/3ML) 0.083% IN NEBU: 5.0000 mg | INHALATION_SOLUTION | Freq: Once | RESPIRATORY_TRACT | Status: DC

## 2021-06-20 MED ORDER — PAROXETINE HCL ER 37.5 MG PO TB24
37.5000 mg | ORAL_TABLET | Freq: Every day | ORAL | Status: DC
  Administered 2021-06-20 – 2021-06-27 (×8): 37.5 mg via ORAL
  Filled 2021-06-20 (×9): qty 1

## 2021-06-20 MED ORDER — MIRTAZAPINE 7.5 MG PO TABS
7.5000 mg | ORAL_TABLET | Freq: Every day | ORAL | Status: DC
  Administered 2021-06-20 – 2021-06-27 (×8): 7.5 mg via ORAL
  Filled 2021-06-20 (×9): qty 1

## 2021-06-20 MED ORDER — IOHEXOL 350 MG/ML SOLN
100.0000 mL | Freq: Once | INTRAVENOUS | Status: AC | PRN
  Administered 2021-06-20: 100 mL via INTRAVENOUS

## 2021-06-20 MED ORDER — SENNA 8.6 MG PO TABS
1.0000 | ORAL_TABLET | Freq: Two times a day (BID) | ORAL | Status: DC
Start: 2021-06-20 — End: 2021-06-28
  Administered 2021-06-21 – 2021-06-28 (×14): 8.6 mg via ORAL
  Filled 2021-06-20 (×14): qty 1

## 2021-06-20 MED ORDER — ENOXAPARIN SODIUM 40 MG/0.4ML IJ SOSY
40.0000 mg | PREFILLED_SYRINGE | INTRAMUSCULAR | Status: DC
  Administered 2021-06-20 – 2021-06-24 (×5): 40 mg via SUBCUTANEOUS
  Filled 2021-06-20 (×5): qty 0.4

## 2021-06-20 MED ORDER — SODIUM CHLORIDE 0.9 % IV SOLN
2.0000 g | INTRAVENOUS | Status: AC
  Administered 2021-06-21 – 2021-06-24 (×4): 2 g via INTRAVENOUS
  Filled 2021-06-20 (×4): qty 20

## 2021-06-20 MED ORDER — AZITHROMYCIN 250 MG PO TABS
500.0000 mg | ORAL_TABLET | Freq: Every day | ORAL | Status: AC
  Administered 2021-06-21 – 2021-06-24 (×4): 500 mg via ORAL
  Filled 2021-06-20 (×4): qty 2

## 2021-06-20 MED ORDER — CARBOXYMETHYLCELLULOSE SODIUM 1 % OP SOLN: 1.0000 [drp] | Freq: Three times a day (TID) | OPHTHALMIC | Status: DC | PRN

## 2021-06-20 MED ORDER — GABAPENTIN 600 MG PO TABS
600.0000 mg | ORAL_TABLET | Freq: Three times a day (TID) | ORAL | Status: DC
  Administered 2021-06-20 – 2021-06-28 (×23): 600 mg via ORAL
  Filled 2021-06-20 (×24): qty 1

## 2021-06-20 MED ORDER — MENTHOL (TOPICAL ANALGESIC) 4 % EX GEL: 1.0000 "application " | Freq: Every day | CUTANEOUS | Status: DC

## 2021-06-20 MED ORDER — POLYETHYLENE GLYCOL 3350 17 G PO PACK
17.0000 g | PACK | Freq: Every day | ORAL | Status: DC
  Administered 2021-06-21 – 2021-06-28 (×5): 17 g via ORAL
  Filled 2021-06-20 (×7): qty 1

## 2021-06-20 MED ORDER — CLONIDINE HCL 0.2 MG PO TABS
0.2000 mg | ORAL_TABLET | Freq: Two times a day (BID) | ORAL | Status: DC
  Administered 2021-06-20 – 2021-06-28 (×16): 0.2 mg via ORAL
  Filled 2021-06-20 (×16): qty 1

## 2021-06-20 MED ORDER — LACTATED RINGERS IV BOLUS
500.0000 mL | Freq: Once | INTRAVENOUS | Status: AC
  Administered 2021-06-20: 500 mL via INTRAVENOUS

## 2021-06-20 MED ORDER — ACETAMINOPHEN 325 MG PO TABS
650.0000 mg | ORAL_TABLET | Freq: Four times a day (QID) | ORAL | Status: DC | PRN
  Administered 2021-06-21 – 2021-06-27 (×7): 650 mg via ORAL
  Filled 2021-06-20 (×7): qty 2

## 2021-06-20 MED ORDER — HYDRALAZINE HCL 50 MG PO TABS
50.0000 mg | ORAL_TABLET | Freq: Four times a day (QID) | ORAL | Status: DC | PRN
  Administered 2021-06-28: 50 mg via ORAL
  Filled 2021-06-20: qty 1

## 2021-06-20 MED ORDER — LACTATED RINGERS IV BOLUS: 500.0000 mL | Freq: Once | INTRAVENOUS | Status: DC

## 2021-06-20 MED ORDER — ATORVASTATIN CALCIUM 40 MG PO TABS
40.0000 mg | ORAL_TABLET | Freq: Every day | ORAL | Status: DC
  Administered 2021-06-20 – 2021-06-27 (×8): 40 mg via ORAL
  Filled 2021-06-20 (×8): qty 1

## 2021-06-20 MED ORDER — SODIUM CHLORIDE 0.9 % IV SOLN
500.0000 mg | Freq: Once | INTRAVENOUS | Status: AC
  Administered 2021-06-20: 500 mg via INTRAVENOUS
  Filled 2021-06-20: qty 500

## 2021-06-20 MED ORDER — DEXTROMETHORPHAN-BENZOCAINE 5-7.5 MG MT LOZG: 1.0000 | LOZENGE | OROMUCOSAL | Status: DC | PRN

## 2021-06-20 MED ORDER — LORATADINE 10 MG PO TABS
10.0000 mg | ORAL_TABLET | Freq: Every day | ORAL | Status: DC
  Administered 2021-06-21 – 2021-06-28 (×8): 10 mg via ORAL
  Filled 2021-06-20 (×8): qty 1

## 2021-06-20 MED ORDER — LISINOPRIL 20 MG PO TABS
20.0000 mg | ORAL_TABLET | Freq: Every day | ORAL | Status: DC
  Administered 2021-06-21: 20 mg via ORAL
  Filled 2021-06-20: qty 1

## 2021-06-20 MED ORDER — MAGIC MOUTHWASH
10.0000 mL | Freq: Two times a day (BID) | ORAL | Status: DC
Start: 2021-06-20 — End: 2021-06-28
  Administered 2021-06-20 – 2021-06-28 (×16): 10 mL via ORAL
  Filled 2021-06-20 (×18): qty 10

## 2021-06-20 MED ORDER — MUSCLE RUB 10-15 % EX CREA
TOPICAL_CREAM | Freq: Every day | CUTANEOUS | Status: DC
  Administered 2021-06-23 – 2021-06-26 (×3): 1 via TOPICAL
  Filled 2021-06-20: qty 85

## 2021-06-20 MED ORDER — BUSPIRONE HCL 15 MG PO TABS
7.5000 mg | ORAL_TABLET | Freq: Three times a day (TID) | ORAL | Status: DC
  Administered 2021-06-20 – 2021-06-28 (×23): 7.5 mg via ORAL
  Filled 2021-06-20 (×2): qty 2
  Filled 2021-06-20 (×2): qty 1
  Filled 2021-06-20: qty 2
  Filled 2021-06-20: qty 1
  Filled 2021-06-20: qty 2
  Filled 2021-06-20 (×3): qty 1
  Filled 2021-06-20: qty 2
  Filled 2021-06-20: qty 1
  Filled 2021-06-20 (×2): qty 2
  Filled 2021-06-20 (×3): qty 1
  Filled 2021-06-20 (×2): qty 2
  Filled 2021-06-20 (×4): qty 1
  Filled 2021-06-20 (×5): qty 2
  Filled 2021-06-20 (×8): qty 1
  Filled 2021-06-20: qty 2
  Filled 2021-06-20: qty 1
  Filled 2021-06-20: qty 2
  Filled 2021-06-20 (×2): qty 1
  Filled 2021-06-20: qty 2
  Filled 2021-06-20: qty 1
  Filled 2021-06-20 (×2): qty 2

## 2021-06-20 MED ORDER — SODIUM CHLORIDE 0.9 % IV SOLN
1.0000 g | Freq: Once | INTRAVENOUS | Status: AC
  Administered 2021-06-20: 1 g via INTRAVENOUS
  Filled 2021-06-20: qty 10

## 2021-06-20 NOTE — ED Notes (Addendum)
O2 86% on 3L nasal canula. Increased nasal canula to 6L O2 90%. RN to notify MD

## 2021-06-20 NOTE — ED Notes (Signed)
Switched pt to nasal canula 3L baseline per Dr. Tamala Julian request. Pt O2 currently 92% nasal canula 3L

## 2021-06-20 NOTE — Progress Notes (Signed)
Pt admitted to [place], VS as per flow. Pt on 6L  100% saturation. Purewick in place. Pt oriented to 6E processes. Pt familiar with the [facility] system. All questions and concerns addressed. Call bell placed within reach, will continue to monitor and maintain safety.

## 2021-06-20 NOTE — ED Provider Notes (Signed)
[facility] EMERGENCY DEPARTMENT Provider Note   CSN: [id number] Arrival date & time: 06/20/21  1246     History Chief Complaint  Patient presents with   Shortness of Breath    Wendy Erickson is a 80 y.o. female.   Shortness of Breath Associated symptoms: cough   Associated symptoms: no abdominal pain, no chest pain, no ear pain, no fever, no headaches, no neck pain, no rash, no sore throat and no vomiting   Patient presents from skilled nursing facility for altered mental status, shortness of breath, and hypoxia.  At baseline, she is alert and oriented.  She does utilize 3 L of supplemental oxygen at baseline.  This morning, she was found to be dyspneic and hypoxic on her baseline 3 L.  EMS notes that she was somnolent with pinpoint pupils on arrival.  Her medication list does include oxycodone.  They gave her 0.5 mg of Narcan and she was subsequently more awake.  They treated her with 1 DuoNeb and 125 mg of Solu-Medrol.  No other medications were given during transit.  On arrival, patient denies any current pain.  She does state that she feels hot.  She also states that it is difficult to talk due to her shortness of breath.  She has reportedly been complaining of shortness of breath over the past week.    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   Shingles    Right V1 distribution   Symptomatic anemia 06/21/2018   Trigeminal neuralgia of right side of face 06/09/2015   V1  distribution    Patient Active Problem List   Diagnosis Date Noted   CAP (community acquired pneumonia) 06/20/2021   Leukocytosis 06/20/2021   Anxiety 06/20/2021   DNR (do not resuscitate) 06/20/2021   Acute on chronic respiratory failure with hypoxia ([place]) 06/20/2021   Thyroid function study abnormality 06/02/2021   Numbness 06/02/2021   GI bleed 07/07/2020   Acute blood loss anemia 07/03/2020   Femur fracture, left ([place]) 07/01/2020   Normocytic anemia 06/21/2018   Microcytic hypochromic anemia 06/21/2018   Fall    Subarachnoid hemorrhage ([place]) 05/25/2016   Subdural hematoma 05/25/2016   SAH (subarachnoid hemorrhage) ([place]) 05/25/2016   Laceration of face-right supraorbital 05/25/2016   Trigeminal neuralgia of right side of face 06/09/2015   Abnormality of gait 12/05/2014   Dysphagia, pharyngoesophageal phase 12/05/2014   Cholelithiasis 05/14/2014   Abdominal pain, epigastric 94/50/3888   Systolic murmur 28/00/3491   Chest pain 05/11/2014   Atypical chest pain 05/11/2014   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   Hydronephrosis of right kidney 04/13/2013   Absent kidney, acquired 04/13/2013   Pyelonephritis 04/11/2013   AKI (acute kidney injury) ([place]) 04/11/2013   HTN (hypertension) 04/11/2013    Past Surgical History:  Procedure Laterality Date   BACK SURGERY     BIOPSY  07/08/2020   Procedure:  BIOPSY;  Surgeon: Clarene Essex, MD;  Location: [facility];  Service: Endoscopy;;   CATARACT EXTRACTION W/ INTRAOCULAR LENS IMPLANT Left    DILATION AND CURETTAGE OF UTERUS     ESOPHAGOGASTRODUODENOSCOPY N/A 07/08/2020   Procedure: ESOPHAGOGASTRODUODENOSCOPY (EGD);  Surgeon: Clarene Essex, MD;  Location: [facility];  Service: Endoscopy;  Laterality: N/A;   FEMUR IM NAIL Left 06/30/2020   Procedure: INTRAMEDULLARY (IM) NAIL FEMORAL;  Surgeon: Nicholes Stairs, MD;  Location: [facility];  Service: Orthopedics;  Laterality: Left;   KNEE ARTHROSCOPY Bilateral     LAPAROSCOPIC NEPHRECTOMY  10/06/2011   Procedure: LAPAROSCOPIC NEPHRECTOMY;  Surgeon: Dutch Gray, MD;  Location: [facility];  Service: Urology;  Laterality: Left;  LEFT LAPAROSCOPIC RADICAL NEPHRECTOMY    LUMBAR [place] SURGERY     "herniated disc"   TONSILLECTOMY     VAGINAL HYSTERECTOMY       OB History   No obstetric history on file.     Family History  Problem Relation Age of Onset   Ataxia Father    Dementia Mother     Social History   Tobacco Use   Smoking status: Former    Packs/day: 1.00    Years: 40.00    Pack years: 40.00    Types: Cigarettes    Quit date: 08/08/2005    Years since quitting: 15.8   Smokeless tobacco: Never  Vaping Use   Vaping Use: Never used  Substance Use Topics   Alcohol use: No    Comment: wine on Friday   Drug use: No    Home Medications Prior to Admission medications   Medication Sig Start Date End Date Taking? Authorizing Provider  acetaminophen (TYLENOL) 325 MG tablet Take 650 mg by mouth every 6 (six) hours as needed for moderate pain or fever (max 10 in 24 hours).    Yes [provider]  alum & mag hydroxide-simeth (MAALOX/MYLANTA) 200-200-20 MG/5 SUSP Apply 1 application topically every 6 (six) hours as needed (indigestion).   Yes [provider]  amLODipine (NORVASC) 10 MG tablet Take 10 mg by mouth at bedtime.   Yes [provider]  atorvastatin (LIPITOR) 40 MG tablet Take 40 mg by mouth at bedtime.    Yes [provider]  busPIRone (BUSPAR) 7.5 MG tablet Take 7.5 mg by mouth 3 (three) times daily.   Yes [provider]  Calcium Carbonate-Vitamin D 600-400 MG-UNIT tablet Take 1 tablet by mouth daily.    Yes [provider]  carboxymethylcellulose 1 % ophthalmic solution Apply 1 drop to eye 3 (three) times daily as needed (dry eyes).   Yes [provider]  cetirizine (ZYRTEC) 5 MG tablet Take 5 mg by mouth daily.   Yes [provider]  Cholecalciferol (VITAMIN D3)  1.25 MG (50000 UT) CAPS Take 1.25 mg by mouth every 30 (thirty) days.   Yes [provider]  cloNIDine (CATAPRES) 0.2 MG tablet Take 0.2 mg by mouth 2 (two) times daily.   Yes [provider]  CRANBERRY-VITAMIN C PO Take 1 tablet by mouth at bedtime.   Yes [provider]  Dextromethorphan-Benzocaine 5-7.5 MG LOZG Use as directed 1 lozenge in the mouth or throat every 2 (two) hours as needed (cough, allergies).   Yes [provider]  ferrous sulfate 325 (65 FE) MG EC tablet Take 1 tablet (325 mg total) by mouth 2 (two) times daily. Patient taking differently: Take 325 mg by mouth daily. 06/22/18 06/20/21 Yes Elgergawy, Silver Huguenin, MD  furosemide (  LASIX) 40 MG tablet Take 40 mg by mouth daily.   Yes [provider]  gabapentin (NEURONTIN) 600 MG tablet Take 600 mg by mouth 3 (three) times daily.   Yes [provider]  hydrALAZINE (APRESOLINE) 50 MG tablet Take 50 mg by mouth every 6 (six) hours as needed (SBP > 170).   Yes [provider]  lisinopril (ZESTRIL) 20 MG tablet Take 20 mg by mouth daily.   Yes [provider]  magic mouthwash SOLN Take 10 mLs by mouth in the morning and at bedtime.   Yes [provider]  magnesium hydroxide (MILK OF MAGNESIA) 400 MG/5ML suspension Take 30 mLs by mouth daily as needed for mild constipation.   Yes [provider]  Menthol, Topical Analgesic, (BIOFREEZE) 4 % GEL Apply 1 application topically at bedtime.   Yes [provider]  mirtazapine (REMERON) 7.5 MG tablet Take 7.5 mg by mouth at bedtime.   Yes [provider]  oxyCODONE-acetaminophen (PERCOCET/ROXICET) 5-325 MG tablet Take 1 tablet by mouth every 6 (six) hours as needed for severe pain.   Yes [provider]  PARoxetine (PAXIL-CR) 37.5 MG 24 hr tablet Take 37.5 mg by mouth at bedtime.   Yes [provider]  polyethylene glycol (MIRALAX) packet Take 17 g by mouth daily as needed for  moderate constipation. Patient taking differently: Take 17 g by mouth daily. 05/14/14  Yes Domenic Polite, MD  potassium chloride (KLOR-CON) 10 MEQ tablet Take 10 mEq by mouth daily.   Yes [provider]  senna (SENOKOT) 8.6 MG tablet Take 1-2 tablets by mouth See admin instructions. Take 1 tablet in the morning and 2 tablets at night   Yes [provider]  amLODipine (NORVASC) 5 MG tablet Take 1 tablet (5 mg total) by mouth daily. Patient not taking: Reported on 06/20/2021 07/10/20 08/09/20  Marianna Payment, MD  gabapentin (NEURONTIN) 300 MG capsule Take 1 capsule (300 mg total) by mouth 3 (three) times daily. Patient not taking: No sig reported 07/03/18   Kathrynn Ducking, MD  HYDROcodone-acetaminophen (NORCO/VICODIN) 5-325 MG tablet Take 2 tablets by mouth every 6 (six) hours as needed for moderate pain. Patient not taking: Reported on 06/20/2021 06/30/20 06/30/21  Nicholes Stairs, MD  lisinopril (ZESTRIL) 5 MG tablet Take 1 tablet (5 mg total) by mouth daily. Patient not taking: Reported on 06/20/2021 07/10/20 08/09/20  Marianna Payment, MD  pantoprazole (PROTONIX) 40 MG tablet Take 1 tablet (40 mg total) by mouth 2 (two) times daily for 30 days, THEN 1 tablet (40 mg total) daily. Patient not taking: Reported on 06/20/2021 07/10/20 11/07/20  Marianna Payment, MD  rivaroxaban (XARELTO) 10 MG TABS tablet Take 1 tablet (10 mg total) by mouth daily for 10 days. Patient not taking: Reported on 06/20/2021 07/11/20 07/21/20  Marianna Payment, MD    Allergies    Pineapple  Review of Systems   Review of Systems  Constitutional:  Positive for activity change, appetite change and fatigue. Negative for chills and fever.  HENT:  Negative for ear pain and sore throat.   Eyes:  Negative for pain and visual disturbance.  Respiratory:  Positive for cough and shortness of breath.   Cardiovascular:  Negative for chest pain, palpitations and leg swelling.  Gastrointestinal:  Negative for abdominal  pain, diarrhea, nausea and vomiting.  Genitourinary:  Negative for dysuria, flank pain and hematuria.  Musculoskeletal:  Negative for arthralgias, back pain, myalgias and neck pain.  Skin:  Negative for color  change and rash.  Neurological:  Negative for dizziness, seizures, syncope, weakness, light-headedness, numbness and headaches.  Hematological:  Does not bruise/bleed easily.  Psychiatric/Behavioral:  Positive for confusion and decreased concentration.   All other systems reviewed and are negative.  Physical Exam Updated Vital Signs BP (!) 161/73 (BP Location: Left Arm)   Pulse 87   Temp 97.8 F (36.6 C) (Oral)   Resp 15   Ht 5\' 5"  (1.651 m)   Wt 74.4 kg   SpO2 95%   BMI 27.29 kg/m   Physical Exam Vitals and nursing note reviewed.  Constitutional:      General: She is not in acute distress.    Appearance: She is well-developed. She is ill-appearing. She is not toxic-appearing or diaphoretic.  HENT:     Head: Normocephalic and atraumatic.     Mouth/Throat:     Mouth: Mucous membranes are moist.     Pharynx: Oropharynx is clear.  Eyes:     Conjunctiva/sclera: Conjunctivae normal.  Cardiovascular:     Rate and Rhythm: Normal rate and regular rhythm.     Heart sounds: No murmur heard. Pulmonary:     Effort: Accessory muscle usage present. No respiratory distress.     Breath sounds: Examination of the right-lower field reveals decreased breath sounds. Examination of the left-lower field reveals decreased breath sounds. Decreased breath sounds present. No wheezing or rhonchi.  Abdominal:     Palpations: Abdomen is soft.     Tenderness: There is no abdominal tenderness.  Musculoskeletal:     Cervical back: Normal range of motion and neck supple.     Right lower leg: No edema.     Left lower leg: No edema.  Skin:    General: Skin is warm and dry.     Coloration: Skin is pale.  Neurological:     General: No focal deficit present.     Mental Status: She is alert.      Cranial Nerves: No cranial nerve deficit.     Motor: No weakness.  Psychiatric:        Mood and Affect: Mood normal.        Behavior: Behavior normal.    ED Results / Procedures / Treatments   Labs (all labs ordered are listed, but only abnormal results are displayed) Labs Reviewed  COMPREHENSIVE METABOLIC PANEL - Abnormal; Notable for the following components:      Result Value   Chloride 94 (*)    CO2 34 (*)    Glucose, Bld 127 (*)    BUN 33 (*)    Creatinine, Ser 1.05 (*)    Albumin 3.3 (*)    GFR, Estimated 54 (*)    All other components within normal limits  CBC WITH DIFFERENTIAL/PLATELET - Abnormal; Notable for the following components:   WBC 11.8 (*)    RBC 3.85 (*)    Hemoglobin 11.8 (*)    Neutro Abs 7.9 (*)    All other components within normal limits  URINALYSIS, ROUTINE W REFLEX MICROSCOPIC - Abnormal; Notable for the following components:   APPearance CLOUDY (*)    Hgb urine dipstick SMALL (*)    Protein, ur 100 (*)    Nitrite POSITIVE (*)    Leukocytes,Ua TRACE (*)    WBC, UA >50 (*)    Bacteria, UA MANY (*)    Non Squamous Epithelial 0-5 (*)    All other components within normal limits  D-DIMER, QUANTITATIVE - Abnormal; Notable for the following components:  D-Dimer, Quant 0.80 (*)    All other components within normal limits  I-STAT CHEM 8, ED - Abnormal; Notable for the following components:   Chloride 97 (*)    BUN 40 (*)    Creatinine, Ser 1.20 (*)    Glucose, Bld 128 (*)    TCO2 38 (*)    All other components within normal limits  I-STAT VENOUS BLOOD GAS, ED - Abnormal; Notable for the following components:   pH, Ven 7.463 (*)    pO2, Ven 125.0 (*)    Bicarbonate 41.8 (*)    TCO2 44 (*)    Acid-Base Excess 15.0 (*)    Calcium, Ion 1.13 (*)    HCT 35.0 (*)    Hemoglobin 11.9 (*)    All other components within normal limits  RESP PANEL BY RT-PCR (FLU A&B, COVID) ARPGX2  MAGNESIUM  BRAIN NATRIURETIC PEPTIDE  TSH  HIV ANTIBODY (ROUTINE  TESTING W REFLEX)  PROCALCITONIN  TROPONIN I (HIGH SENSITIVITY)  TROPONIN I (HIGH SENSITIVITY)    EKG None  Radiology CT Angio Chest Pulmonary Embolism (PE) W or WO Contrast  Result Date: 06/20/2021 CLINICAL DATA:  PE suspected, low/intermediate prob, positive D-dimer EXAM: CT ANGIOGRAPHY CHEST WITH CONTRAST TECHNIQUE: Multidetector CT imaging of the chest was performed using the standard protocol during bolus administration of intravenous contrast. Multiplanar CT image reconstructions and MIPs were obtained to evaluate the vascular anatomy. CONTRAST:  161mL OMNIPAQUE IOHEXOL 350 MG/ML SOLN COMPARISON:  CT 05/21/2019 FINDINGS: Cardiovascular: Mildly enlarged heart. Trace pericardial thickening/effusion. Mildly enlarged branch pulmonary arteries. There is no evidence of pulmonary embolism. The thoracic aorta demonstrates moderate arch calcifications. No aneurysm or dissection is evident. Mediastinum/Nodes: No lymphadenopathy. The thyroid is unremarkable. Esophagus is unremarkable. Lungs/Pleura: The central airways are patent. There is diffuse mild bronchial wall thickening. There is interlobular septal thickening and ground-glass opacities. Small to moderate-sized bilateral pleural effusions with adjacent basilar atelectasis. There is a 6 mm ground-glass nodule in the right middle lobe (series 6, image 65). There is also bandlike opacities along the fissures which could be a combination of bandlike atelectasis and fluid in the fissures. Upper: Tiny hepatic liver densities which are likely cysts. No acute abnormality. Musculoskeletal: Unchanged chronic anterior compression deformity of T12. No acute abnormality. No suspicious lytic or blastic lesions. Review of the MIP images confirms the above findings. IMPRESSION: Mild to moderate pulmonary edema. Small to moderate-sized bilateral pleural effusions with adjacent basilar atelectasis. Trace pericardial effusion. No evidence of pulmonary embolism. 6 mm  ground-glass nodule in the right middle lobe. Initial follow-up with CT at 6-12 months is recommended to confirm persistence. If persistent, repeat CT is recommended every 2 years until 5 years of stability has been established. This recommendation follows the consensus statement: Guidelines for Management of Incidental Pulmonary Nodules Detected on CT Images: From the Fleischner Society 2017; Radiology 2017; 284:228-243. Aortic Atherosclerosis (ICD10-I70.0). Electronically Signed   By: Maurine Simmering M.D.   On: 06/20/2021 18:34   DG Chest Port 1 View  Result Date: 06/20/2021 CLINICAL DATA:  Shortness of breath. EXAM: PORTABLE CHEST 1 VIEW COMPARISON:  Chest x-ray dated June 30, 2020. FINDINGS: The heart size and mediastinal contours are within normal limits. Low lung volumes. New small left pleural effusion with left basilar opacity. Mild right basilar atelectasis with unchanged linear scarring. No pneumothorax. No acute osseous abnormality. IMPRESSION: 1. New small left pleural effusion with left basilar atelectasis versus pneumonia. Electronically Signed   By: Orville Govern.D.  On: 06/20/2021 13:59    Procedures Procedures   Medications Ordered in ED Medications  acetaminophen (TYLENOL) tablet 650 mg (has no administration in time range)  enoxaparin (LOVENOX) injection 40 mg (40 mg Subcutaneous Given 06/20/21 1552)  cefTRIAXone (ROCEPHIN) 2 g in sodium chloride 0.9 % 100 mL IVPB (has no administration in time range)  azithromycin (ZITHROMAX) tablet 500 mg (has no administration in time range)  atorvastatin (LIPITOR) tablet 40 mg (40 mg Oral Given 06/20/21 2107)  lisinopril (ZESTRIL) tablet 20 mg (has no administration in time range)  cloNIDine (CATAPRES) tablet 0.2 mg (0.2 mg Oral Given 06/20/21 2107)  hydrALAZINE (APRESOLINE) tablet 50 mg (has no administration in time range)  mirtazapine (REMERON) tablet 7.5 mg (7.5 mg Oral Given 06/20/21 2108)  busPIRone (BUSPAR) tablet 7.5 mg (7.5  mg Oral Given 06/20/21 2107)  PARoxetine (PAXIL-CR) 24 hr tablet 37.5 mg (37.5 mg Oral Given 06/20/21 2107)  polyethylene glycol (MIRALAX / GLYCOLAX) packet 17 g (17 g Oral Patient Refused/Not Given 06/20/21 2104)  senna (SENOKOT) tablet 8.6 mg (8.6 mg Oral Patient Refused/Not Given 06/20/21 2104)  magic mouthwash (10 mLs Oral Given 06/20/21 2108)  gabapentin (NEURONTIN) tablet 600 mg (600 mg Oral Given 06/20/21 2107)  loratadine (CLARITIN) tablet 10 mg (has no administration in time range)  Muscle Rub CREA ( Topical Given 06/20/21 2108)  lactated ringers bolus 500 mL (0 mLs Intravenous Stopped 06/20/21 1537)  cefTRIAXone (ROCEPHIN) 1 g in sodium chloride 0.9 % 100 mL IVPB (0 g Intravenous Stopped 06/20/21 1632)  azithromycin (ZITHROMAX) 500 mg in sodium chloride 0.9 % 250 mL IVPB (500 mg Intravenous New Bag/Given 06/20/21 1639)  iohexol (OMNIPAQUE) 350 MG/ML injection 100 mL (100 mLs Intravenous Contrast Given 06/20/21 1803)    ED Course  I have reviewed the triage vital signs and the nursing notes.  Pertinent labs & imaging results that were available during my care of the patient were reviewed by me and considered in my medical decision making (see chart for details).    MDM Rules/Calculators/A&P                         CRITICAL CARE Performed by: Godfrey Pick   Total critical care time: 45 minutes  Critical care time was exclusive of separately billable procedures and treating other patients.  Critical care was necessary to treat or prevent imminent or life-threatening deterioration.  Critical care was time spent personally by me on the following activities: development of treatment plan with patient and/or surrogate as well as nursing, discussions with consultants, evaluation of patient's response to treatment, examination of patient, obtaining history from patient or surrogate, ordering and performing treatments and interventions, ordering and review of laboratory studies,  ordering and review of radiographic studies, pulse oximetry and re-evaluation of patient's condition.   Patient presents for hypoxia, shortness of breath, and altered mental status.  Altered mental status reportedly improved mildly following 0.5 mg of Narcan.  On arrival, she is oriented to person, place, and time.  She is not able to fully answer questions appropriately.  She does have increased work of breathing and has difficult time completing sentences due to her shortness of breath.  On her baseline 3 L of oxygen, SPO2 is 85%.  She is placed on a nonrebreather at 15 L with subsequent SPO2 of 95%.  Per chart review, she does not have diagnosis of restrictive airway disease.  On lung auscultation, she does have diminished breath sounds in lower lung  fields.  Following placement of nonrebreather, she did have improvement in mental status.  Patient did arrive with a DNR form.  I did speak to her about CODE STATUS if her breathing is to worsen.  Patient stated that she would not want to be intubated and placed on a breathing machine and that she would rather be allowed to die.  Patient's son was present at bedside during this conversation.  He states that this is in line with previous conversations that have been had with the patient.  Patient to be DNR/DNI.  Work-up was initiated to identify etiology of her hypoxia and shortness of breath.  Chest x-ray showed findings consistent with pneumonia.  Antibiotics were ordered.  On reassessment, patient was able to be weaned down to 10 L of supplemental oxygen.  D-dimer, when age-adjusted, indicates PE is unlikely.  Patient was admitted to hospitalist for further management.  Final Clinical Impression(s) / ED Diagnoses Final diagnoses:  Acute on chronic respiratory failure with hypoxia [facility])    Rx / DC Orders ED Discharge Orders     None        Godfrey Pick, MD 06/20/21 2310

## 2021-06-20 NOTE — H&P (Signed)
History and Physical    [name]:[id number] DOB: [birth year] DOA: 06/20/2021  Referring MD/NP/PA: Godfrey Pick, MD PCP: Pcp, No  Patient coming from:Nursing Home via EMS  Chief Complaint: Shortness of breath  I have personally briefly reviewed patient's old medical records in [place]   HPI: [name] is a 80 y.o. female with medical history significant of  hypertension, hyperlipidemia, peripheral neuropathy, left renal neoplasm, cerebellar ataxia, wheelchair-bound, and on chronic 3 L of oxygen who presented to the ED due to worsening shortness of breath.  Patient reports that she has had a cough and been short of breath.  History is somewhat limited as patient reports speech difficulties.  Patient states that she has been unable to cough anything significant up.  Notes that she has a difficult time talking and eating due to her medical condition.  In route with EMS patient was noted to be hypoxic satting 86% on home 3 L and was noted to be very lethargic.  She was put on 8 L with improvement in O2 saturations to 94%.  Home medication regimen includes oxycodone for which patient had been given a dose of Narcan in route with improvement in mental status.  Given Solu-Medrol 125 mg IV and DuoNeb breathing treatment.  Patient has DNR in place.  ED Course: Upon admission into the emergency department patient was noted to be afebrile, respirations 12-23, blood pressures 105/70 8-1 30/54, and O2 saturations noted to be as low as 89% with improvement on 15 L nonrebreather.  Venous blood gas noted PCO2 of 58.3.  Labs significant for WBC 11.8, hemoglobin 11.8, CO2 34, BUN 33, creatinine 1.05, BNP 23, and high-sensitivity troponin 10, and D-dimer elevated 0.8.  Chest x-ray noted new small left pleural effusion with left basilar atelectasis versus pneumonia.  Patient was given 500 mL of normal saline IV fluids, Rocephin, and azithromycin.  Review of Systems  Unable to perform ROS: Medical  condition  Respiratory:  Positive for cough and shortness of breath.   Cardiovascular:  Negative for chest pain.  Musculoskeletal:  Negative for falls.  Neurological:  Negative for loss of consciousness.   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   Shingles    Right V1 distribution   Symptomatic anemia 06/21/2018   Trigeminal neuralgia of right side of face 06/09/2015   V1 distribution    Past Surgical History:  Procedure Laterality Date   BACK SURGERY     BIOPSY  07/08/2020   Procedure: BIOPSY;  Surgeon: Clarene Essex, MD;  Location: [facility];  Service: Endoscopy;;   CATARACT EXTRACTION W/ INTRAOCULAR LENS IMPLANT Left    DILATION AND CURETTAGE OF UTERUS     ESOPHAGOGASTRODUODENOSCOPY N/A 07/08/2020   Procedure: ESOPHAGOGASTRODUODENOSCOPY (EGD);  Surgeon: Clarene Essex, MD;  Location: [facility];  Service: Endoscopy;  Laterality: N/A;   FEMUR IM NAIL Left 06/30/2020   Procedure: INTRAMEDULLARY (IM) NAIL  FEMORAL;  Surgeon: Nicholes Stairs, MD;  Location: [facility];  Service: Orthopedics;  Laterality: Left;   KNEE ARTHROSCOPY Bilateral    LAPAROSCOPIC NEPHRECTOMY  10/06/2011   Procedure: LAPAROSCOPIC NEPHRECTOMY;  Surgeon: Dutch Gray, MD;  Location: [facility];  Service: Urology;  Laterality: Left;  LEFT LAPAROSCOPIC RADICAL NEPHRECTOMY    LUMBAR [place] SURGERY     "herniated disc"   TONSILLECTOMY     VAGINAL HYSTERECTOMY       reports that she quit smoking about 15 years ago. Her smoking use included  cigarettes. She has a 40.00 pack-year smoking history. She has never used smokeless tobacco. She reports that she does not drink alcohol and does not use drugs.  Allergies  Allergen Reactions   Pineapple Other (See Comments)    unknown    Family History  Problem Relation Age of Onset   Ataxia Father    Dementia Mother     Prior to Admission medications   Medication Sig Start Date End Date Taking? Authorizing Provider  acetaminophen (TYLENOL) 325 MG tablet Take 650 mg by mouth every 6 (six) hours as needed for moderate pain or fever (max 10 in 24 hours).    Yes [provider]  alum & mag hydroxide-simeth (MAALOX/MYLANTA) 200-200-20 MG/5 SUSP Apply 1 application topically every 6 (six) hours as needed (indigestion).   Yes [provider]  amLODipine (NORVASC) 10 MG tablet Take 10 mg by mouth at bedtime.   Yes [provider]  atorvastatin (LIPITOR) 40 MG tablet Take 40 mg by mouth at bedtime.    Yes [provider]  busPIRone (BUSPAR) 7.5 MG tablet Take 7.5 mg by mouth 3 (three) times daily.   Yes [provider]  Calcium Carbonate-Vitamin D 600-400 MG-UNIT tablet Take 1 tablet by mouth daily.    Yes [provider]  carboxymethylcellulose 1 % ophthalmic solution Apply 1 drop to eye 3 (three) times daily as needed (dry eyes).   Yes [provider]  cetirizine (ZYRTEC) 5 MG tablet Take 5 mg by mouth daily.   Yes [provider]  Cholecalciferol (VITAMIN D3) 1.25 MG (50000 UT) CAPS Take 1.25 mg by mouth every 30 (thirty) days.   Yes [provider]  cloNIDine (CATAPRES) 0.2 MG tablet Take 0.2 mg by mouth 2 (two) times daily.   Yes [provider]  CRANBERRY-VITAMIN C PO Take 1 tablet by mouth at bedtime.   Yes [provider]  Dextromethorphan-Benzocaine 5-7.5 MG LOZG Use as directed 1 lozenge in the mouth or throat every 2 (two) hours as needed (cough, allergies).   Yes [provider]   ferrous sulfate 325 (65 FE) MG EC tablet Take 1 tablet (325 mg total) by mouth 2 (two) times daily. Patient taking differently: Take 325 mg by mouth daily. 06/22/18 06/20/21 Yes Elgergawy, Silver Huguenin, MD  furosemide (LASIX) 40 MG tablet Take 40 mg by mouth daily.   Yes [provider]  gabapentin (NEURONTIN) 600 MG tablet Take 600 mg by mouth 3 (three) times daily.   Yes [provider]  hydrALAZINE (APRESOLINE) 50 MG tablet Take 50 mg by mouth every 6 (six) hours as needed (SBP > 170).   Yes [provider]  lisinopril (ZESTRIL) 20 MG tablet Take 20 mg by mouth daily.   Yes [provider]  magic mouthwash SOLN Take 10 mLs by mouth in the morning and at bedtime.   Yes [provider]  magnesium hydroxide (MILK OF MAGNESIA) 400 MG/5ML  suspension Take 30 mLs by mouth daily as needed for mild constipation.   Yes [provider]  Menthol, Topical Analgesic, (BIOFREEZE) 4 % GEL Apply 1 application topically at bedtime.   Yes [provider]  mirtazapine (REMERON) 7.5 MG tablet Take 7.5 mg by mouth at bedtime.   Yes [provider]  oxyCODONE-acetaminophen (PERCOCET/ROXICET) 5-325 MG tablet Take 1 tablet by mouth every 6 (six) hours as needed for severe pain.   Yes [provider]  PARoxetine (PAXIL-CR) 37.5 MG 24 hr tablet Take 37.5 mg by mouth at bedtime.   Yes [provider]  polyethylene glycol (MIRALAX) packet Take 17 g by mouth daily as needed for moderate constipation. Patient taking differently: Take 17 g by mouth daily. 05/14/14  Yes Domenic Polite, MD  potassium chloride (KLOR-CON) 10 MEQ tablet Take 10 mEq by mouth daily.   Yes [provider]  senna (SENOKOT) 8.6 MG tablet Take 1-2 tablets by mouth See admin instructions. Take 1 tablet in the morning and 2 tablets at night   Yes [provider]  amLODipine (NORVASC) 5 MG tablet Take 1 tablet (5 mg total) by mouth daily. Patient not  taking: Reported on 06/20/2021 07/10/20 08/09/20  Marianna Payment, MD  gabapentin (NEURONTIN) 300 MG capsule Take 1 capsule (300 mg total) by mouth 3 (three) times daily. Patient not taking: No sig reported 07/03/18   Kathrynn Ducking, MD  HYDROcodone-acetaminophen (NORCO/VICODIN) 5-325 MG tablet Take 2 tablets by mouth every 6 (six) hours as needed for moderate pain. Patient not taking: Reported on 06/20/2021 06/30/20 06/30/21  Nicholes Stairs, MD  lisinopril (ZESTRIL) 5 MG tablet Take 1 tablet (5 mg total) by mouth daily. Patient not taking: Reported on 06/20/2021 07/10/20 08/09/20  Marianna Payment, MD  pantoprazole (PROTONIX) 40 MG tablet Take 1 tablet (40 mg total) by mouth 2 (two) times daily for 30 days, THEN 1 tablet (40 mg total) daily. Patient not taking: Reported on 06/20/2021 07/10/20 11/07/20  Marianna Payment, MD  rivaroxaban (XARELTO) 10 MG TABS tablet Take 1 tablet (10 mg total) by mouth daily for 10 days. Patient not taking: Reported on 06/20/2021 07/11/20 07/21/20  Marianna Payment, MD    Physical Exam:  Constitutional: Elderly female who appears to be in some distress, but able to follow commands Vitals:   06/20/21 1333 06/20/21 1345 06/20/21 1400 06/20/21 1415  BP:   (!) 111/47 105/78  Pulse: 68 69 66 67  Resp: 15 19 (!) 23 15  Temp:      TempSrc:      SpO2: 95% 97% 93% 100%  Weight:      Height:       Eyes: PERRL, lids and conjunctivae normal ENMT: Mucous membranes are moist. Posterior pharynx clear of any exudate or lesions. Speech is broken and intermittently garbled at times as though she possibly could be aspirating. Neck: normal, supple, no masses, no thyromegaly.  No JVD. Respiratory: Normal respiratory effort at this time with mild crackles and rails noted at the left lung base.  Patient currently on a nonrebreather with O2 saturations intermittently fluctuating due to waveform between 88 and 93% Cardiovascular: Regular rate and rhythm, no murmurs / rubs / gallops. No  extremity edema. 2+ pedal pulses. No carotid bruits.  Abdomen: no tenderness, no masses palpated. No hepatosplenomegaly. Bowel sounds positive.  Musculoskeletal: no clubbing / cyanosis.  Contractures noted of the bilateral lower extremities.  Poor muscle tone. Skin: no rashes, lesions, ulcers.   Neurologic: CN 2-12  grossly intact.  Intermittent myoclonic jerking of the limbs noted.  Psychiatric: Normal judgment and insight. Alert and oriented x 3. Normal mood.     Labs on Admission: I have personally reviewed following labs and imaging studies  CBC: Recent Labs  Lab 06/20/21 1302 06/20/21 1329 06/20/21 1330  WBC 11.8*  --   --   NEUTROABS 7.9*  --   --   HGB 11.8* 11.9* 12.2  HCT 37.9 35.0* 36.0  MCV 98.4  --   --   PLT 324  --   --    Basic Metabolic Panel: Recent Labs  Lab 06/20/21 1302 06/20/21 1329 06/20/21 1330  NA 141 140 142  K 3.6 3.8 3.8  CL 94*  --  97*  CO2 34*  --   --   GLUCOSE 127*  --  128*  BUN 33*  --  40*  CREATININE 1.05*  --  1.20*  CALCIUM 9.8  --   --   MG 1.9  --   --    GFR: Estimated Creatinine Clearance: 34.8 mL/min (A) (by C-G formula based on SCr of 1.2 mg/dL (H)). Liver Function Tests: Recent Labs  Lab 06/20/21 1302  AST 18  ALT 14  ALKPHOS 69  BILITOT 0.5  PROT 6.6  ALBUMIN 3.3*   No results for input(s): LIPASE, AMYLASE in the last 168 hours. No results for input(s): AMMONIA in the last 168 hours. Coagulation Profile: No results for input(s): INR, PROTIME in the last 168 hours. Cardiac Enzymes: No results for input(s): CKTOTAL, CKMB, CKMBINDEX, TROPONINI in the last 168 hours. BNP (last 3 results) No results for input(s): PROBNP in the last 8760 hours. HbA1C: No results for input(s): HGBA1C in the last 72 hours. CBG: No results for input(s): GLUCAP in the last 168 hours. Lipid Profile: No results for input(s): CHOL, HDL, LDLCALC, TRIG, CHOLHDL, LDLDIRECT in the last 72 hours. Thyroid Function Tests: No results for  input(s): TSH, T4TOTAL, FREET4, T3FREE, THYROIDAB in the last 72 hours. Anemia Panel: No results for input(s): VITAMINB12, FOLATE, FERRITIN, TIBC, IRON, RETICCTPCT in the last 72 hours. Urine analysis:    Component Value Date/Time   COLORURINE YELLOW 06/27/2018 1845   APPEARANCEUR HAZY (A) 06/27/2018 1845   LABSPEC 1.020 06/27/2018 1845   PHURINE 5.0 06/27/2018 1845   GLUCOSEU NEGATIVE 06/27/2018 1845   HGBUR NEGATIVE 06/27/2018 1845   BILIRUBINUR NEGATIVE 06/27/2018 1845   KETONESUR 5 (A) 06/27/2018 1845   PROTEINUR 30 (A) 06/27/2018 1845   UROBILINOGEN 0.2 10/29/2014 0816   NITRITE NEGATIVE 06/27/2018 1845   LEUKOCYTESUR NEGATIVE 06/27/2018 1845   Sepsis Labs: No results found for this or any previous visit (from the past 240 hour(s)).   Radiological Exams on Admission: DG Chest Port 1 View  Result Date: 06/20/2021 CLINICAL DATA:  Shortness of breath. EXAM: PORTABLE CHEST 1 VIEW COMPARISON:  Chest x-ray dated June 30, 2020. FINDINGS: The heart size and mediastinal contours are within normal limits. Low lung volumes. New small left pleural effusion with left basilar opacity. Mild right basilar atelectasis with unchanged linear scarring. No pneumothorax. No acute osseous abnormality. IMPRESSION: 1. New small left pleural effusion with left basilar atelectasis versus pneumonia. Electronically Signed   By: Titus Dubin M.D.   On: 06/20/2021 13:59    EKG: Independently reviewed.  Sinus rhythm at 66 bpm  Assessment/Plan Acute on chronic respiratory failure with hypoxia secondary to suspect pneumonia: Patient presents after being found to be altered with O2 saturations 86% on home  3 L of oxygen.  Initially placed on nonrebreather breather with improvement in O2 saturations.  Venous blood gas noted PCO2 58.3.  Chest x-ray noting new small left-sided pleural effusion and left basilar atelectasis versus pneumonia.  She is bedbound at baseline and D-dimer was elevated at 0.8.  Patient  had been started on empiric antibiotics of Rocephin and azithromycin.  On differential includes pneumonia, versus viral versus aspiration versus possible PE. -Admit to a progressive bed -Continuous pulse oximetry with oxygen as needed to maintain O2 saturations greater than 92%. -Aspiration precautions with elevation of head of the bed -Check procalcitonin -Check CTA of the chest although lower suspicion for PE patient is sedentary. -Consult speech therapy evaluation as patient has prior history of dysphagia  Leukocytosis: Labs significant for WBC 11.8 on admission and was mildly tachypneic, but does not meet SIRS criteria at this time. -Recheck CBC tomorrow morning  Normocytic anemia: Chronic.  Hemoglobin 11.8 g/dL, but appears to be improved from previous records where hemoglobin was 7.9 in 2021. -Continue to monitor  Cerebellar ataxia: Patient is wheelchair-bound at baseline. -Continue gabapentin  Essential hypertension: Blood pressures currently maintained.  Home blood pressure regimen includes amlodipine 10 mg daily, clonidine 0.2 mg 2 times daily, furosemide 40 mg daily, hydralazine 50 mg as needed for blood pressures greater than 170, and lisinopril 20 mg daily -Continue current medication regimen except for hed furosemide initially due to elevated BUN to creatinine ratio suggest dehydration.  Anxiety -Continue current home medication regimen  Hyperlipidemia -Continue atorvastatin  DNR: Present on admission  DVT prophylaxis: Lovenox Code Status: DNR Family Communication: Attempted to call son on 2 separate occasions, but number go straight to voicemail and says mailbox is full. Disposition Plan: Likely discharge back to nursing facility once medically stable Consults called: None Admission status: Inpatient, require more than 2 midnight stay  Norval Morton MD [facility]   If 7PM-7AM, please contact night-coverage   06/20/2021, 3:04 PM

## 2021-06-20 NOTE — ED Triage Notes (Signed)
Pt BIB EMS due to SOB. PT usually wears 3L of O2. Nursing homes states she was satting at 86% on 3L. EMS put pt on 8L and pt started to sat 94%. Pt has DNR form at bedside. EMS states pt had a med rec of codeine and gave her narcan and pt became more alert.  EMS meds   Atrovent  0.5  125 solumedrol  5mg  of albuterol.0.5 narcan.

## 2021-06-21 ENCOUNTER — Inpatient Hospital Stay (HOSPITAL_COMMUNITY): Payer: Medicare Other

## 2021-06-21 DIAGNOSIS — D649 Anemia, unspecified: Secondary | ICD-10-CM

## 2021-06-21 DIAGNOSIS — J9621 Acute and chronic respiratory failure with hypoxia: Secondary | ICD-10-CM

## 2021-06-21 DIAGNOSIS — J189 Pneumonia, unspecified organism: Principal | ICD-10-CM

## 2021-06-21 DIAGNOSIS — D72829 Elevated white blood cell count, unspecified: Secondary | ICD-10-CM

## 2021-06-21 DIAGNOSIS — R1314 Dysphagia, pharyngoesophageal phase: Secondary | ICD-10-CM

## 2021-06-21 DIAGNOSIS — I10 Essential (primary) hypertension: Secondary | ICD-10-CM

## 2021-06-21 DIAGNOSIS — F419 Anxiety disorder, unspecified: Secondary | ICD-10-CM

## 2021-06-21 LAB — ECHOCARDIOGRAM COMPLETE
AR max vel: 2.91 cm2
AV Peak grad: 15.1 mmHg
Ao pk vel: 1.94 m/s
Area-P 1/2: 3.6 cm2
Calc EF: 68.8 %
Height: 65 in
S' Lateral: 2.4 cm
Single Plane A2C EF: 69.3 %
Single Plane A4C EF: 67.1 %
Weight: 2624.36 oz

## 2021-06-21 MED ORDER — PERFLUTREN LIPID MICROSPHERE
1.0000 mL | INTRAVENOUS | Status: AC | PRN
  Administered 2021-06-21: 2 mL via INTRAVENOUS
  Filled 2021-06-21: qty 10

## 2021-06-21 MED ORDER — FUROSEMIDE 10 MG/ML IJ SOLN
40.0000 mg | Freq: Every day | INTRAMUSCULAR | Status: DC
  Administered 2021-06-21 – 2021-06-22 (×2): 40 mg via INTRAVENOUS
  Filled 2021-06-21 (×2): qty 4

## 2021-06-21 MED ORDER — FERROUS SULFATE 325 (65 FE) MG PO TABS
325.0000 mg | ORAL_TABLET | Freq: Every day | ORAL | Status: DC
  Administered 2021-06-22 – 2021-06-28 (×7): 325 mg via ORAL
  Filled 2021-06-21 (×7): qty 1

## 2021-06-21 NOTE — Progress Notes (Signed)
PROGRESS NOTE  [name]  ATF:[id number] DOB: April 14, 1941 DOA: 06/20/2021 PCP: Merryl Hacker, No  Outpatient Specialists: Neurology, Dr. Jannifer Franklin; Endocrinology, Dr. Loanne Drilling; pulmonology, Dr. Annamaria Boots. Brief Narrative: Wendy Erickson is a 80 y.o. female with a history of spinocerebellar ataxia, HTN, HLD, left RCC s/p nephrectomy, chronic 3L O2-dependence who presented to the ED from facility due to dyspnea and cough. she was noted to require 6L O2 to maintain oxygenation, and appeared lethargic which was improved with narcan. D-dimer equivocal and subsequent CTA chest showed no PE. There was evidence of mild-moderate pulmonary edema, small-moderate pleural effusions. Echocardiogram revealed evidence of elevated RA pressure, LVEF presreved, LVH with diastolic function indeterminate. Lasix and antibiotics given. SLP evaluation requested.  Assessment & Plan: Principal Problem:   Acute on chronic respiratory failure with hypoxia (HCC) Active Problems:   HTN (hypertension)   Dysphagia, pharyngoesophageal phase   Normocytic anemia   CAP (community acquired pneumonia)   Leukocytosis   Anxiety   DNR (do not resuscitate)  Acute on chronic hypoxic respiratory failure: BNP, troponin, PCT all reassuring, though WBC elevated and CT suggestive of pulmonary edema.  - SLP evaluation requested as aspiration pneumonia is possibly contributing. - Wean back to 3L O2 as tolerated  Acute HFpEF: Troponin wnl and had nonischemic myoview 2015.  - Repeat echo revealed preserved LVEF, asymmetric LVH, indeterminate diastolic function, dilated and blunted IVC. Interstitial edema with pleural effusions on CT chest consistent with pulmonary edema. Has crackles on exam. Despite elevated creatinine, needs diuresis challenge. Give lasix 40mg  IV today, monitor UOP, weights.   Possible pneumonia:  - Continue antibiotics for a maximum of 5 days.  AKI: Possibly related to fluid overload.  - Stop lisinopril, was given this AM.  -  Monitor with diuresis.   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 Jannifer Franklin until 2020 when prn follow up was recommended. - Continue gabapentin.   Anxiety/depression:  - Continue buspar, paroxetine, remeron  HLD:  - Continue statin  Iron deficiency anemia:  - Continue ferrous sulfate, bowel regimen.   HTN:  - Continue lisinopril, clonidine, prn hydralazine   Trigeminal neuralgia:  - Continue gabapentin. Stopped carbamazepine previously due to anemia  RML pulmonary nodule: 70mm - Needs repeat CT in 6-12 months from scan on 06/20/2021.   DVT prophylaxis: Lovenox 40mg  Code Status: DNR Family Communication: None at bedside Disposition Plan:  Status is: Inpatient  Remains inpatient appropriate because: Persistently hypoxemic not at baseline.  Consultants:  None  Procedures:  Echocardiogram 06/21/2021:   1. Left ventricular ejection fraction, by estimation, is 60 to 65%. The  left ventricle has normal function. The left ventricle has no regional  wall motion abnormalities. There is moderate asymmetric left ventricular  hypertrophy of the basal-septal  segment. Left ventricular diastolic parameters are indeterminate.   2. Right ventricular systolic function is normal. The right ventricular  size is mildly enlarged. Tricuspid regurgitation signal is inadequate for  assessing PA pressure.   3. A small pericardial effusion is present. The pericardial effusion is  circumferential.   4. The mitral valve is normal in structure. Mild mitral valve  regurgitation. No evidence of mitral stenosis.   5. The aortic valve is normal in structure. Aortic valve regurgitation is  mild. Aortic valve sclerosis is present, with no evidence of aortic valve  stenosis.   6. The inferior vena cava is dilated in size with <50% respiratory  variability, suggesting right atrial pressure of 15 mmHg.  Antimicrobials: Ceftriaxone, azithromycin   Subjective: Shortness  of  breath is worse than baseline, about the same as when she got here. No chest pain. Legs are swollen. Does have difficulty swallowing at times, requires feeding assistance.   Objective: Vitals:   06/20/21 2349 06/21/21 0419 06/21/21 0734 06/21/21 1137  BP: (!) 147/63 (!) 158/59 (!) 159/64 (!) 141/59  Pulse: 80 73 68 80  Resp: 16 10 16 13   Temp: 97.8 F (36.6 C) 97.8 F (36.6 C) 97.9 F (36.6 C) 97.9 F (36.6 C)  TempSrc: Oral Oral Oral Oral  SpO2: 96% 97% 94% 96%  Weight:      Height:        Intake/Output Summary (Last 24 hours) at 06/21/2021 1200 Last data filed at 06/21/2021 0453 Gross per 24 hour  Intake 586.39 ml  Output 400 ml  Net 186.39 ml   Filed Weights   06/20/21 1310 06/20/21 1849  Weight: 68 kg 74.4 kg    Gen: 80 y.o. female in no distress HEENT: Protruding eyes bilaterally Pulm: Non-labored breathing 6L O2, crackles bibasilar.  CV: Regular rate and rhythm. No murmur, rub, or gallop. No JVD, + bilateral pedal edema. GI: Abdomen soft, non-tender, non-distended, with normoactive bowel sounds. No organomegaly or masses felt. Ext: Warm, dry, quadriparesis noted Skin: No rashes, lesions or ulcers on visualized skin Neuro: Alert and oriented. Quadriparetic. Psych: Judgement and insight appear normal. Mood & affect appropriate.   Data Reviewed: I have personally reviewed following labs and imaging studies  CBC: Recent Labs  Lab 06/20/21 1302 06/20/21 1329 06/20/21 1330  WBC 11.8*  --   --   NEUTROABS 7.9*  --   --   HGB 11.8* 11.9* 12.2  HCT 37.9 35.0* 36.0  MCV 98.4  --   --   PLT 324  --   --    Basic Metabolic Panel: Recent Labs  Lab 06/20/21 1302 06/20/21 1329 06/20/21 1330  NA 141 140 142  K 3.6 3.8 3.8  CL 94*  --  97*  CO2 34*  --   --   GLUCOSE 127*  --  128*  BUN 33*  --  40*  CREATININE 1.05*  --  1.20*  CALCIUM 9.8  --   --   MG 1.9  --   --    GFR: Estimated Creatinine Clearance: 39 mL/min (A) (by C-G formula based on SCr of 1.2  mg/dL (H)). Liver Function Tests: Recent Labs  Lab 06/20/21 1302  AST 18  ALT 14  ALKPHOS 69  BILITOT 0.5  PROT 6.6  ALBUMIN 3.3*   No results for input(s): LIPASE, AMYLASE in the last 168 hours. No results for input(s): AMMONIA in the last 168 hours. Coagulation Profile: No results for input(s): INR, PROTIME in the last 168 hours. Cardiac Enzymes: No results for input(s): CKTOTAL, CKMB, CKMBINDEX, TROPONINI in the last 168 hours. BNP (last 3 results) No results for input(s): PROBNP in the last 8760 hours. HbA1C: No results for input(s): HGBA1C in the last 72 hours. CBG: No results for input(s): GLUCAP in the last 168 hours. Lipid Profile: No results for input(s): CHOL, HDL, LDLCALC, TRIG, CHOLHDL, LDLDIRECT in the last 72 hours. Thyroid Function Tests: Recent Labs    06/20/21 1309  TSH 2.435   Anemia Panel: No results for input(s): VITAMINB12, FOLATE, FERRITIN, TIBC, IRON, RETICCTPCT in the last 72 hours. Urine analysis:    Component Value Date/Time   COLORURINE YELLOW 06/20/2021 1303   APPEARANCEUR CLOUDY (A) 06/20/2021 1303   LABSPEC 1.012 06/20/2021 1303  PHURINE 6.0 06/20/2021 1303   GLUCOSEU NEGATIVE 06/20/2021 1303   HGBUR SMALL (A) 06/20/2021 1303   BILIRUBINUR NEGATIVE 06/20/2021 1303   KETONESUR NEGATIVE 06/20/2021 1303   PROTEINUR 100 (A) 06/20/2021 1303   UROBILINOGEN 0.2 10/29/2014 0816   NITRITE POSITIVE (A) 06/20/2021 1303   LEUKOCYTESUR TRACE (A) 06/20/2021 1303   No results found for this or any previous visit (from the past 240 hour(s)).    Radiology Studies: CT Angio Chest Pulmonary Embolism (PE) W or WO Contrast  Result Date: 06/20/2021 CLINICAL DATA:  PE suspected, low/intermediate prob, positive D-dimer EXAM: CT ANGIOGRAPHY CHEST WITH CONTRAST TECHNIQUE: Multidetector CT imaging of the chest was performed using the standard protocol during bolus administration of intravenous contrast. Multiplanar CT image reconstructions and MIPs were  obtained to evaluate the vascular anatomy. CONTRAST:  153mL OMNIPAQUE IOHEXOL 350 MG/ML SOLN COMPARISON:  CT 05/21/2019 FINDINGS: Cardiovascular: Mildly enlarged heart. Trace pericardial thickening/effusion. Mildly enlarged branch pulmonary arteries. There is no evidence of pulmonary embolism. The thoracic aorta demonstrates moderate arch calcifications. No aneurysm or dissection is evident. Mediastinum/Nodes: No lymphadenopathy. The thyroid is unremarkable. Esophagus is unremarkable. Lungs/Pleura: The central airways are patent. There is diffuse mild bronchial wall thickening. There is interlobular septal thickening and ground-glass opacities. Small to moderate-sized bilateral pleural effusions with adjacent basilar atelectasis. There is a 6 mm ground-glass nodule in the right middle lobe (series 6, image 65). There is also bandlike opacities along the fissures which could be a combination of bandlike atelectasis and fluid in the fissures. Upper: Tiny hepatic liver densities which are likely cysts. No acute abnormality. Musculoskeletal: Unchanged chronic anterior compression deformity of T12. No acute abnormality. No suspicious lytic or blastic lesions. Review of the MIP images confirms the above findings. IMPRESSION: Mild to moderate pulmonary edema. Small to moderate-sized bilateral pleural effusions with adjacent basilar atelectasis. Trace pericardial effusion. No evidence of pulmonary embolism. 6 mm ground-glass nodule in the right middle lobe. Initial follow-up with CT at 6-12 months is recommended to confirm persistence. If persistent, repeat CT is recommended every 2 years until 5 years of stability has been established. This recommendation follows the consensus statement: Guidelines for Management of Incidental Pulmonary Nodules Detected on CT Images: From the Fleischner Society 2017; Radiology 2017; 284:228-243. Aortic Atherosclerosis (ICD10-I70.0). Electronically Signed   By: Maurine Simmering M.D.   On:  06/20/2021 18:34   DG Chest Port 1 View  Result Date: 06/20/2021 CLINICAL DATA:  Shortness of breath. EXAM: PORTABLE CHEST 1 VIEW COMPARISON:  Chest x-ray dated June 30, 2020. FINDINGS: The heart size and mediastinal contours are within normal limits. Low lung volumes. New small left pleural effusion with left basilar opacity. Mild right basilar atelectasis with unchanged linear scarring. No pneumothorax. No acute osseous abnormality. IMPRESSION: 1. New small left pleural effusion with left basilar atelectasis versus pneumonia. Electronically Signed   By: Titus Dubin M.D.   On: 06/20/2021 13:59   ECHOCARDIOGRAM COMPLETE  Result Date: 06/21/2021    ECHOCARDIOGRAM REPORT   Patient Name:   Wendy Erickson Date of Exam: 06/21/2021 Medical Rec #:  [id number]     Height:       65.0 in Accession #:    [id number]    Weight:       164.0 lb Date of Birth:  10/26/1940     BSA:          1.818 m Patient Age:    43 years      BP:  150/61 mmHg Patient Gender: F             HR:           68 bpm. Exam Location:  Inpatient Procedure: 2D Echo, Cardiac Doppler, Color Doppler and Intracardiac            Opacification Agent Indications:    Respiratory distress  History:        Patient has no prior history of Echocardiogram examinations.                 Risk Factors:Hypertension.  Sonographer:    Jyl Heinz Referring Phys: [place]  1. Left ventricular ejection fraction, by estimation, is 60 to 65%. The left ventricle has normal function. The left ventricle has no regional wall motion abnormalities. There is moderate asymmetric left ventricular hypertrophy of the basal-septal segment. Left ventricular diastolic parameters are indeterminate.  2. Right ventricular systolic function is normal. The right ventricular size is mildly enlarged. Tricuspid regurgitation signal is inadequate for assessing PA pressure.  3. A small pericardial effusion is present. The pericardial effusion is  circumferential.  4. The mitral valve is normal in structure. Mild mitral valve regurgitation. No evidence of mitral stenosis.  5. The aortic valve is normal in structure. Aortic valve regurgitation is mild. Aortic valve sclerosis is present, with no evidence of aortic valve stenosis.  6. The inferior vena cava is dilated in size with <50% respiratory variability, suggesting right atrial pressure of 15 mmHg. FINDINGS  Left Ventricle: Left ventricular ejection fraction, by estimation, is 60 to 65%. The left ventricle has normal function. The left ventricle has no regional wall motion abnormalities. The left ventricular internal cavity size was normal in size. There is  moderate asymmetric left ventricular hypertrophy of the basal-septal segment. Left ventricular diastolic parameters are indeterminate. Right Ventricle: The right ventricular size is mildly enlarged. No increase in right ventricular wall thickness. Right ventricular systolic function is normal. Tricuspid regurgitation signal is inadequate for assessing PA pressure. Left Atrium: Left atrial size was normal in size. Right Atrium: Right atrial size was normal in size. Pericardium: A small pericardial effusion is present. The pericardial effusion is circumferential. Presence of epicardial fat layer. Mitral Valve: The mitral valve is normal in structure. Mild mitral valve regurgitation. No evidence of mitral valve stenosis. Tricuspid Valve: The tricuspid valve is normal in structure. Tricuspid valve regurgitation is trivial. No evidence of tricuspid stenosis. Aortic Valve: The aortic valve is normal in structure. Aortic valve regurgitation is mild. Aortic valve sclerosis is present, with no evidence of aortic valve stenosis. Aortic valve peak gradient measures 15.1 mmHg. Pulmonic Valve: The pulmonic valve was normal in structure. Pulmonic valve regurgitation is not visualized. No evidence of pulmonic stenosis. Aorta: The aortic root is normal in size and  structure. Venous: The inferior vena cava is dilated in size with less than 50% respiratory variability, suggesting right atrial pressure of 15 mmHg. IAS/Shunts: No atrial level shunt detected by color flow Doppler.  LEFT VENTRICLE PLAX 2D LVIDd:         4.10 cm      Diastology LVIDs:         2.40 cm      LV e' medial:    3.70 cm/s LV PW:         1.15 cm      LV E/e' medial:  18.2 LV IVS:        1.40 cm      LV  e' lateral:   4.03 cm/s LVOT diam:     2.00 cm      LV E/e' lateral: 16.7 LV SV:         120 LV SV Index:   66 LVOT Area:     3.14 cm  LV Volumes (MOD) LV vol d, MOD A2C: 104.0 ml LV vol d, MOD A4C: 114.0 ml LV vol s, MOD A2C: 31.9 ml LV vol s, MOD A4C: 37.5 ml LV SV MOD A2C:     72.1 ml LV SV MOD A4C:     114.0 ml LV SV MOD BP:      76.3 ml RIGHT VENTRICLE             IVC RV Basal diam:  4.30 cm     IVC diam: 2.20 cm RV S prime:     10.90 cm/s TAPSE (M-mode): 2.4 cm LEFT ATRIUM           Index        RIGHT ATRIUM           Index LA diam:      2.30 cm 1.26 cm/m   RA Area:     13.70 cm LA Vol (A2C): 29.7 ml 16.33 ml/m  RA Volume:   34.50 ml  18.97 ml/m LA Vol (A4C): 34.8 ml 19.14 ml/m  AORTIC VALVE AV Area (Vmax): 2.91 cm AV Vmax:        194.00 cm/s AV Peak Grad:   15.1 mmHg LVOT Vmax:      180.00 cm/s LVOT Vmean:     120.000 cm/s LVOT VTI:       0.383 m  AORTA Ao Root diam: 2.80 cm Ao Asc diam:  2.60 cm MITRAL VALVE MV Area (PHT): 3.60 cm     SHUNTS MV Decel Time: 211 msec     Systemic VTI:  0.38 m MV E velocity: 67.30 cm/s   Systemic Diam: 2.00 cm MV A velocity: 106.00 cm/s MV E/A ratio:  0.63 Kardie Tobb DO Electronically signed by Berniece Salines DO Signature Date/Time: 06/21/2021/11:52:29 AM    Final     Scheduled Meds:  atorvastatin  40 mg Oral QHS   azithromycin  500 mg Oral Daily   busPIRone  7.5 mg Oral TID   cloNIDine  0.2 mg Oral BID   enoxaparin (LOVENOX) injection  40 mg Subcutaneous Q24H   gabapentin  600 mg Oral TID   lisinopril  20 mg Oral Daily   loratadine  10 mg Oral Daily    magic mouthwash  10 mL Oral BID   mirtazapine  7.5 mg Oral QHS   Muscle Rub   Topical QHS   PARoxetine  37.5 mg Oral QHS   polyethylene glycol  17 g Oral Daily   senna  1 tablet Oral BID   Continuous Infusions:  cefTRIAXone (ROCEPHIN)  IV       LOS: 1 day   Time spent: 35 minutes.  Patrecia Pour, MD [facility] [email or web address] 06/21/2021, 12:00 PM

## 2021-06-21 NOTE — Evaluation (Signed)
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[place])    Chronic kidney disease    left renal neoplasm   Depression    Dysphagia, pharyngoesophageal phase 12/05/2014   Foot fracture, right    Gait disorder    Heart murmur    High cholesterol    History of gout    Hypertension    Kidney malignancy ([place])    left renal neoplasm   Osteopenia    Peripheral neuropathy    SCA-3 (spinocerebellar ataxia type 3) ([place]) 05/13/2013   Shingles    Right V1 distribution   Symptomatic anemia 06/21/2018   Trigeminal neuralgia of right side of face 06/09/2015   V1 distribution   Past Surgical History:  Past Surgical History:  Procedure Laterality Date   BACK SURGERY     BIOPSY  07/08/2020   Procedure: BIOPSY;  Surgeon: Clarene Essex, MD;  Location: [facility];  Service: Endoscopy;;   CATARACT EXTRACTION W/ INTRAOCULAR LENS IMPLANT Left    DILATION AND CURETTAGE OF UTERUS     ESOPHAGOGASTRODUODENOSCOPY N/A 07/08/2020   Procedure: ESOPHAGOGASTRODUODENOSCOPY (EGD);  Surgeon: Clarene Essex, MD;  Location: [facility];  Service: Endoscopy;  Laterality: N/A;   FEMUR IM NAIL Left 06/30/2020   Procedure: INTRAMEDULLARY (IM) NAIL FEMORAL;  Surgeon: Nicholes Stairs, MD;  Location: [facility];  Service: Orthopedics;  Laterality: Left;   KNEE ARTHROSCOPY Bilateral    LAPAROSCOPIC NEPHRECTOMY  10/06/2011   Procedure: LAPAROSCOPIC NEPHRECTOMY;  Surgeon: Dutch Gray, MD;  Location: [facility];  Service: Urology;  Laterality: Left;  LEFT LAPAROSCOPIC RADICAL NEPHRECTOMY    LUMBAR [place] SURGERY     "herniated disc"   TONSILLECTOMY      VAGINAL HYSTERECTOMY     HPI:  Wendy Erickson is a 80 y.o. female who presented to the ED from SNF due to worsening shortness of breath. Dx acute on chronic respiratory failure with hypoxia secondary to suspected pna.  Prior medical history significant for hypertension, hyperlipidemia, peripheral neuropathy, left renal neoplasm, cerebellar ataxia, wheelchair-bound, and on chronic 3 L of oxygen. Hx of pharyngoesophageal dysphagia 2016- chart review revealed  MBS in May 2016 with mild dysphagia and functional airway protection. MBS August 2019 with no real change. Notable on both exams was concern for a primary esophageal dysphagia. Barium swallow/esophagram was recommended at the tim. Chart review reveals EGD December 2021- esophagus was normal.    Assessment / Plan / Recommendation  Clinical Impression  Pt participated in clinical swallowing assessment.  At baseline she requires assist for feeding intermittently. Reports she can feed herself finger foods, but it is more difficult now to use utensils (especially forks, which she said "tear my mouth up!") When trialing solid foods and thin liquids in isolation, she demonstrated adequate mastication of solids, good oral control of liquids, and no s/s of aspiration.  Assisted pt with lunch tray (hamburger, fries) - when talking and eating simultaneously or when drinking large, successive quantities of water, she coughed.  When cued to slow rate of liquid intake and not talk while chewing, coughing was minimized. She was able to feed herself french fries but hamburger was  more difficult to coordinate due to the ataxia.   Recommend continuing her current regular diet; will need 1:1 assist during meals. Provide reminders to drink one sip at a time and take her time. SLP will f/u x1 to ensure education/safety. SLP Visit Diagnosis: Dysphagia, unspecified (R13.10)    Aspiration Risk  Mild aspiration risk    Diet Recommendation   Regular solids, thin  liquids  Medication Administration: Whole meds with liquid    Other  Recommendations Oral Care Recommendations: Oral care BID    Recommendations for follow up therapy are one component of a multi-disciplinary discharge planning process, led by the attending physician.  Recommendations may be updated based on patient status, additional functional criteria and insurance authorization.  Follow up Recommendations No SLP follow up      Assistance Recommended at Discharge    Functional Status Assessment    Frequency and Duration min 1 x/week  1 week       Prognosis        Swallow Study   General HPI: Wendy Erickson is a 80 y.o. female who presented to the ED from SNF due to worsening shortness of breath. Dx acute on chronic respiratory failure with hypoxia secondary to suspected pna.  Prior medical history significant for hypertension, hyperlipidemia, peripheral neuropathy, left renal neoplasm, cerebellar ataxia, wheelchair-bound, and on chronic 3 L of oxygen. Hx of pharyngoesophageal dysphagia 2016- chart review revealed  MBS in May 2016 with mild dysphagia and functional airway protection. MBS August 2019 with no real change. Notable on both exams was concern for a primary esophageal dysphagia. Barium swallow/esophagram was recommended at the tim. Chart review reveals EGD December 2021- esophagus was normal. Type of Study: Bedside Swallow Evaluation Previous Swallow Assessment: see HPI Diet Prior to this Study: Regular;Thin liquids Temperature Spikes Noted: No Respiratory Status: Nasal cannula (5L) History of Recent Intubation: No Behavior/Cognition: Alert;Cooperative;Pleasant mood Oral Cavity Assessment: Within Functional Limits Oral Care Completed by SLP: No Oral Cavity - Dentition: Missing dentition Vision: Functional for self-feeding Self-Feeding Abilities: Needs assist Patient Positioning: Upright in bed Baseline Vocal Quality:  (variable phonation volume) Volitional Cough:  Strong Volitional Swallow: Able to elicit    Oral/Motor/Sensory Function Overall Oral Motor/Sensory Function: Other (comment) (ataxic movements of oral musculature)   Ice Chips Ice chips: Not tested   Thin Liquid Thin Liquid: Within functional limits    Nectar Thick Nectar Thick Liquid: Not tested   Honey Thick Honey Thick Liquid: Not tested   Puree Puree: Within functional limits   Solid     Solid: Within functional limits      Juan Quam Laurice 06/21/2021,12:35 PM

## 2021-06-22 LAB — CBC WITH DIFFERENTIAL/PLATELET
Abs Immature Granulocytes: 0.04 10*3/uL (ref 0.00–0.07)
Basophils Absolute: 0.1 10*3/uL (ref 0.0–0.1)
Basophils Relative: 1 %
Eosinophils Absolute: 0.2 10*3/uL (ref 0.0–0.5)
Eosinophils Relative: 2 %
HCT: 33.6 % — ABNORMAL LOW (ref 36.0–46.0)
Hemoglobin: 10.6 g/dL — ABNORMAL LOW (ref 12.0–15.0)
Immature Granulocytes: 0 %
Lymphocytes Relative: 21 %
Lymphs Abs: 2.3 10*3/uL (ref 0.7–4.0)
MCH: 30.8 pg (ref 26.0–34.0)
MCHC: 31.5 g/dL (ref 30.0–36.0)
MCV: 97.7 fL (ref 80.0–100.0)
Monocytes Absolute: 0.6 10*3/uL (ref 0.1–1.0)
Monocytes Relative: 6 %
Neutro Abs: 7.8 10*3/uL — ABNORMAL HIGH (ref 1.7–7.7)
Neutrophils Relative %: 70 %
Platelets: 305 10*3/uL (ref 150–400)
RBC: 3.44 MIL/uL — ABNORMAL LOW (ref 3.87–5.11)
RDW: 14.9 % (ref 11.5–15.5)
WBC: 11 10*3/uL — ABNORMAL HIGH (ref 4.0–10.5)
nRBC: 0 % (ref 0.0–0.2)

## 2021-06-22 LAB — BASIC METABOLIC PANEL
Anion gap: 8 (ref 5–15)
BUN: 39 mg/dL — ABNORMAL HIGH (ref 8–23)
CO2: 37 mmol/L — ABNORMAL HIGH (ref 22–32)
Calcium: 9.1 mg/dL (ref 8.9–10.3)
Chloride: 94 mmol/L — ABNORMAL LOW (ref 98–111)
Creatinine, Ser: 0.95 mg/dL (ref 0.44–1.00)
GFR, Estimated: >60 mL/min (ref >60)
Glucose, Bld: 97 mg/dL (ref 70–99)
Potassium: 4 mmol/L (ref 3.5–5.1)
Sodium: 139 mmol/L (ref 135–145)

## 2021-06-22 MED ORDER — FUROSEMIDE 10 MG/ML IJ SOLN
40.0000 mg | Freq: Two times a day (BID) | INTRAMUSCULAR | Status: DC
Start: 2021-06-22 — End: 2021-06-23
  Administered 2021-06-22: 40 mg via INTRAVENOUS
  Filled 2021-06-22: qty 4

## 2021-06-22 NOTE — Progress Notes (Addendum)
PROGRESS NOTE  Wendy Erickson  LKG:[id number] DOB: 1941/07/29 DOA: 06/20/2021 PCP: Merryl Hacker, No  Outpatient Specialists: Neurology, Dr. Jannifer Franklin; Endocrinology, Dr. Loanne Drilling; Pulmonology, Dr. Annamaria Boots. Brief Narrative: Dalina Samara is a 80 y.o. female with a history of spinocerebellar ataxia, HTN, HLD, left RCC s/p nephrectomy, chronic 3L O2-dependence who presented to the ED from facility due to dyspnea and cough. she was noted to require 6L O2 to maintain oxygenation, and appeared lethargic which was improved with narcan. D-dimer equivocal and subsequent CTA chest showed no PE. There was evidence of mild-moderate pulmonary edema, small-moderate pleural effusions. Echocardiogram revealed evidence of elevated RA pressure, LVEF presreved, LVH with diastolic function indeterminate. Lasix and antibiotics given. SLP evaluation requested, felt patient at mild aspiration risk.  Assessment & Plan: Principal Problem:   Acute on chronic respiratory failure with hypoxia (HCC) Active Problems:   HTN (hypertension)   Dysphagia, pharyngoesophageal phase   Normocytic anemia   CAP (community acquired pneumonia)   Leukocytosis   Anxiety   DNR (do not resuscitate)  Acute on chronic hypoxic respiratory failure: BNP, troponin, PCT all reassuring, though WBC elevated and CT suggestive of pulmonary edema.  - SLP evaluation performed, no follow up needed. Pt requires assistance feeding but is at mild aspiration risk.  - Remains on 6L O2, will plan to wean back to 3L O2 as tolerated  Acute HFpEF: Troponin wnl and had nonischemic myoview 2015.  - Repeat echo revealed preserved LVEF, asymmetric LVH, indeterminate diastolic function, dilated and blunted IVC. Interstitial edema with pleural effusions on CT chest consistent with pulmonary edema. Still has crackles on exam.  - Increase lasix 40mg  IV to BID.  - Trend BUN:Cr (improved), monitor I/O, weights.   Possible pneumonia:  - Continue antibiotics for a maximum of 5  days.  AKI: Possibly related to fluid overload.  - Stop lisinopril, was given this AM.  - Monitor with diuresis.   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 Jannifer Franklin until 2020 when prn follow up was recommended. - Continue gabapentin.   Anxiety/depression:  - Continue buspar, paroxetine, remeron  HLD:  - Continue statin  Iron deficiency anemia:  - Continue ferrous sulfate, bowel regimen.   HTN:  - Continue lisinopril, clonidine, prn hydralazine   Trigeminal neuralgia:  - Continue gabapentin. Stopped carbamazepine previously due to anemia  RML pulmonary nodule: 60mm - Needs repeat CT in 6-12 months from scan on 06/20/2021.   DVT prophylaxis: Lovenox 40mg  Code Status: DNR Family Communication: None at bedside Disposition Plan:  Status is: Inpatient  Remains inpatient appropriate because: Persistently hypoxemic not at baseline.  Consultants:  None  Procedures:  Echocardiogram 06/21/2021:   1. Left ventricular ejection fraction, by estimation, is 60 to 65%. The  left ventricle has normal function. The left ventricle has no regional  wall motion abnormalities. There is moderate asymmetric left ventricular  hypertrophy of the basal-septal  segment. Left ventricular diastolic parameters are indeterminate.   2. Right ventricular systolic function is normal. The right ventricular  size is mildly enlarged. Tricuspid regurgitation signal is inadequate for  assessing PA pressure.   3. A small pericardial effusion is present. The pericardial effusion is  circumferential.   4. The mitral valve is normal in structure. Mild mitral valve  regurgitation. No evidence of mitral stenosis.   5. The aortic valve is normal in structure. Aortic valve regurgitation is  mild. Aortic valve sclerosis is present, with no evidence of aortic valve  stenosis.   6. The inferior vena  cava is dilated in size with <50% respiratory  variability, suggesting right atrial  pressure of 15 mmHg.  Antimicrobials: Ceftriaxone, azithromycin   Subjective: She stays warm, no fever, doesn't feel ill necessarily. Breathing remains a bit hard, not at baseline. Has not tolerated attempt to lower O2 over past 24 hours. Leg swelling is improved. She did urinate more than 100cc yesterday.   Objective: Vitals:   06/21/21 1137 06/21/21 2043 06/22/21 0441 06/22/21 0738  BP: (!) 141/59 (!) 161/70 (!) 148/58 (!) 146/62  Pulse: 80 75 69 (!) 56  Resp: 13 12 20 18   Temp: 97.9 F (36.6 C) 98 F (36.7 C) 97.7 F (36.5 C)   TempSrc: Oral Oral Oral   SpO2: 96% 98% 95% 100%  Weight:   74.2 kg   Height:        Intake/Output Summary (Last 24 hours) at 06/22/2021 1445 Last data filed at 06/22/2021 0900 Gross per 24 hour  Intake 120 ml  Output 350 ml  Net -230 ml   Filed Weights   06/20/21 1310 06/20/21 1849 06/22/21 0441  Weight: 68 kg 74.4 kg 74.2 kg   Gen: Chronically ill-appearing female in no acute distress Pulm: Nonlabored but tachypneic with crackles at bases. CV: Regular rate and rhythm. No murmur, rub, or gallop. No JVD, 1+ dependent edema. GI: Abdomen soft, non-tender, non-distended, with normoactive bowel sounds.  Ext: Warm, incomplete quadriparesis noted. Stable. Feet with deformities bilaterally. No open wounds. Skin: No new rashes, lesions or ulcers on visualized skin. Neuro: Alert and oriented. Stable significant deficits without any new focal neurological deficits. Psych: Judgement and insight appear fair. Mood euthymic & affect congruent. Behavior is appropriate.    Data Reviewed: I have personally reviewed following labs and imaging studies  CBC: Recent Labs  Lab 06/20/21 1302 06/20/21 1329 06/20/21 1330 06/22/21 0320  WBC 11.8*  --   --  11.0*  NEUTROABS 7.9*  --   --  7.8*  HGB 11.8* 11.9* 12.2 10.6*  HCT 37.9 35.0* 36.0 33.6*  MCV 98.4  --   --  97.7  PLT 324  --   --  829   Basic Metabolic Panel: Recent Labs  Lab 06/20/21 1302  06/20/21 1329 06/20/21 1330 06/22/21 0320  NA 141 140 142 139  K 3.6 3.8 3.8 4.0  CL 94*  --  97* 94*  CO2 34*  --   --  37*  GLUCOSE 127*  --  128* 97  BUN 33*  --  40* 39*  CREATININE 1.05*  --  1.20* 0.95  CALCIUM 9.8  --   --  9.1  MG 1.9  --   --   --    GFR: Estimated Creatinine Clearance: 49.2 mL/min (by C-G formula based on SCr of 0.95 mg/dL). Liver Function Tests: Recent Labs  Lab 06/20/21 1302  AST 18  ALT 14  ALKPHOS 69  BILITOT 0.5  PROT 6.6  ALBUMIN 3.3*   No results for input(s): LIPASE, AMYLASE in the last 168 hours. No results for input(s): AMMONIA in the last 168 hours. Coagulation Profile: No results for input(s): INR, PROTIME in the last 168 hours. Cardiac Enzymes: No results for input(s): CKTOTAL, CKMB, CKMBINDEX, TROPONINI in the last 168 hours. BNP (last 3 results) No results for input(s): PROBNP in the last 8760 hours. HbA1C: No results for input(s): HGBA1C in the last 72 hours. CBG: No results for input(s): GLUCAP in the last 168 hours. Lipid Profile: No results for input(s): CHOL, HDL,  LDLCALC, TRIG, CHOLHDL, LDLDIRECT in the last 72 hours. Thyroid Function Tests: Recent Labs    06/20/21 1309  TSH 2.435   Anemia Panel: No results for input(s): VITAMINB12, FOLATE, FERRITIN, TIBC, IRON, RETICCTPCT in the last 72 hours. Urine analysis:    Component Value Date/Time   COLORURINE YELLOW 06/20/2021 1303   APPEARANCEUR CLOUDY (A) 06/20/2021 1303   LABSPEC 1.012 06/20/2021 1303   PHURINE 6.0 06/20/2021 1303   GLUCOSEU NEGATIVE 06/20/2021 1303   HGBUR SMALL (A) 06/20/2021 1303   BILIRUBINUR NEGATIVE 06/20/2021 1303   KETONESUR NEGATIVE 06/20/2021 1303   PROTEINUR 100 (A) 06/20/2021 1303   UROBILINOGEN 0.2 10/29/2014 0816   NITRITE POSITIVE (A) 06/20/2021 1303   LEUKOCYTESUR TRACE (A) 06/20/2021 1303   No results found for this or any previous visit (from the past 240 hour(s)).    Radiology Studies: CT Angio Chest Pulmonary Embolism  (PE) W or WO Contrast  Result Date: 06/20/2021 CLINICAL DATA:  PE suspected, low/intermediate prob, positive D-dimer EXAM: CT ANGIOGRAPHY CHEST WITH CONTRAST TECHNIQUE: Multidetector CT imaging of the chest was performed using the standard protocol during bolus administration of intravenous contrast. Multiplanar CT image reconstructions and MIPs were obtained to evaluate the vascular anatomy. CONTRAST:  194mL OMNIPAQUE IOHEXOL 350 MG/ML SOLN COMPARISON:  CT 05/21/2019 FINDINGS: Cardiovascular: Mildly enlarged heart. Trace pericardial thickening/effusion. Mildly enlarged branch pulmonary arteries. There is no evidence of pulmonary embolism. The thoracic aorta demonstrates moderate arch calcifications. No aneurysm or dissection is evident. Mediastinum/Nodes: No lymphadenopathy. The thyroid is unremarkable. Esophagus is unremarkable. Lungs/Pleura: The central airways are patent. There is diffuse mild bronchial wall thickening. There is interlobular septal thickening and ground-glass opacities. Small to moderate-sized bilateral pleural effusions with adjacent basilar atelectasis. There is a 6 mm ground-glass nodule in the right middle lobe (series 6, image 65). There is also bandlike opacities along the fissures which could be a combination of bandlike atelectasis and fluid in the fissures. Upper: Tiny hepatic liver densities which are likely cysts. No acute abnormality. Musculoskeletal: Unchanged chronic anterior compression deformity of T12. No acute abnormality. No suspicious lytic or blastic lesions. Review of the MIP images confirms the above findings. IMPRESSION: Mild to moderate pulmonary edema. Small to moderate-sized bilateral pleural effusions with adjacent basilar atelectasis. Trace pericardial effusion. No evidence of pulmonary embolism. 6 mm ground-glass nodule in the right middle lobe. Initial follow-up with CT at 6-12 months is recommended to confirm persistence. If persistent, repeat CT is recommended  every 2 years until 5 years of stability has been established. This recommendation follows the consensus statement: Guidelines for Management of Incidental Pulmonary Nodules Detected on CT Images: From the Fleischner Society 2017; Radiology 2017; 284:228-243. Aortic Atherosclerosis (ICD10-I70.0). Electronically Signed   By: Maurine Simmering M.D.   On: 06/20/2021 18:34   ECHOCARDIOGRAM COMPLETE  Result Date: 06/21/2021    ECHOCARDIOGRAM REPORT   Patient Name:   KASSADY LABOY Date of Exam: 06/21/2021 Medical Rec #:  [id number]     Height:       65.0 in Accession #:    [id number]    Weight:       164.0 lb Date of Birth:  1940-09-15     BSA:          1.818 m Patient Age:    74 years      BP:           150/61 mmHg Patient Gender: F             HR:  68 bpm. Exam Location:  Inpatient Procedure: 2D Echo, Cardiac Doppler, Color Doppler and Intracardiac            Opacification Agent Indications:    Respiratory distress  History:        Patient has no prior history of Echocardiogram examinations.                 Risk Factors:Hypertension.  Sonographer:    Jyl Heinz Referring Phys: [place]  1. Left ventricular ejection fraction, by estimation, is 60 to 65%. The left ventricle has normal function. The left ventricle has no regional wall motion abnormalities. There is moderate asymmetric left ventricular hypertrophy of the basal-septal segment. Left ventricular diastolic parameters are indeterminate.  2. Right ventricular systolic function is normal. The right ventricular size is mildly enlarged. Tricuspid regurgitation signal is inadequate for assessing PA pressure.  3. A small pericardial effusion is present. The pericardial effusion is circumferential.  4. The mitral valve is normal in structure. Mild mitral valve regurgitation. No evidence of mitral stenosis.  5. The aortic valve is normal in structure. Aortic valve regurgitation is mild. Aortic valve sclerosis is present, with no evidence of  aortic valve stenosis.  6. The inferior vena cava is dilated in size with <50% respiratory variability, suggesting right atrial pressure of 15 mmHg. FINDINGS  Left Ventricle: Left ventricular ejection fraction, by estimation, is 60 to 65%. The left ventricle has normal function. The left ventricle has no regional wall motion abnormalities. The left ventricular internal cavity size was normal in size. There is  moderate asymmetric left ventricular hypertrophy of the basal-septal segment. Left ventricular diastolic parameters are indeterminate. Right Ventricle: The right ventricular size is mildly enlarged. No increase in right ventricular wall thickness. Right ventricular systolic function is normal. Tricuspid regurgitation signal is inadequate for assessing PA pressure. Left Atrium: Left atrial size was normal in size. Right Atrium: Right atrial size was normal in size. Pericardium: A small pericardial effusion is present. The pericardial effusion is circumferential. Presence of epicardial fat layer. Mitral Valve: The mitral valve is normal in structure. Mild mitral valve regurgitation. No evidence of mitral valve stenosis. Tricuspid Valve: The tricuspid valve is normal in structure. Tricuspid valve regurgitation is trivial. No evidence of tricuspid stenosis. Aortic Valve: The aortic valve is normal in structure. Aortic valve regurgitation is mild. Aortic valve sclerosis is present, with no evidence of aortic valve stenosis. Aortic valve peak gradient measures 15.1 mmHg. Pulmonic Valve: The pulmonic valve was normal in structure. Pulmonic valve regurgitation is not visualized. No evidence of pulmonic stenosis. Aorta: The aortic root is normal in size and structure. Venous: The inferior vena cava is dilated in size with less than 50% respiratory variability, suggesting right atrial pressure of 15 mmHg. IAS/Shunts: No atrial level shunt detected by color flow Doppler.  LEFT VENTRICLE PLAX 2D LVIDd:         4.10 cm       Diastology LVIDs:         2.40 cm      LV e' medial:    3.70 cm/s LV PW:         1.15 cm      LV E/e' medial:  18.2 LV IVS:        1.40 cm      LV e' lateral:   4.03 cm/s LVOT diam:     2.00 cm      LV E/e' lateral: 16.7 LV SV:  120 LV SV Index:   66 LVOT Area:     3.14 cm  LV Volumes (MOD) LV vol d, MOD A2C: 104.0 ml LV vol d, MOD A4C: 114.0 ml LV vol s, MOD A2C: 31.9 ml LV vol s, MOD A4C: 37.5 ml LV SV MOD A2C:     72.1 ml LV SV MOD A4C:     114.0 ml LV SV MOD BP:      76.3 ml RIGHT VENTRICLE             IVC RV Basal diam:  4.30 cm     IVC diam: 2.20 cm RV S prime:     10.90 cm/s TAPSE (M-mode): 2.4 cm LEFT ATRIUM           Index        RIGHT ATRIUM           Index LA diam:      2.30 cm 1.26 cm/m   RA Area:     13.70 cm LA Vol (A2C): 29.7 ml 16.33 ml/m  RA Volume:   34.50 ml  18.97 ml/m LA Vol (A4C): 34.8 ml 19.14 ml/m  AORTIC VALVE AV Area (Vmax): 2.91 cm AV Vmax:        194.00 cm/s AV Peak Grad:   15.1 mmHg LVOT Vmax:      180.00 cm/s LVOT Vmean:     120.000 cm/s LVOT VTI:       0.383 m  AORTA Ao Root diam: 2.80 cm Ao Asc diam:  2.60 cm MITRAL VALVE MV Area (PHT): 3.60 cm     SHUNTS MV Decel Time: 211 msec     Systemic VTI:  0.38 m MV E velocity: 67.30 cm/s   Systemic Diam: 2.00 cm MV A velocity: 106.00 cm/s MV E/A ratio:  0.63 Kardie Tobb DO Electronically signed by Berniece Salines DO Signature Date/Time: 06/21/2021/11:52:29 AM    Final     Scheduled Meds:  atorvastatin  40 mg Oral QHS   azithromycin  500 mg Oral Daily   busPIRone  7.5 mg Oral TID   cloNIDine  0.2 mg Oral BID   enoxaparin (LOVENOX) injection  40 mg Subcutaneous Q24H   ferrous sulfate  325 mg Oral Q breakfast   furosemide  40 mg Intravenous Daily   gabapentin  600 mg Oral TID   loratadine  10 mg Oral Daily   magic mouthwash  10 mL Oral BID   mirtazapine  7.5 mg Oral QHS   Muscle Rub   Topical QHS   PARoxetine  37.5 mg Oral QHS   polyethylene glycol  17 g Oral Daily   senna  1 tablet Oral BID   Continuous  Infusions:  cefTRIAXone (ROCEPHIN)  IV 2 g (06/21/21 1635)     LOS: 2 days   Time spent: 35 minutes.  Patrecia Pour, MD [facility] [email or web address] 06/22/2021, 2:45 PM

## 2021-06-22 NOTE — TOC Initial Note (Addendum)
Transition of Care [facility]) - Initial/Assessment Note    Patient Details  Name: Wendy Erickson MRN: [id number] Date of Birth: 01-27-1941  [facility] CM/SW Contact:    Milas Gain, [place] Phone Number: 06/22/2021, 3:05 PM  Clinical Narrative:                  CSW spoke with patient at bedside. Patient reports she comes from [facility] SNF long term. Patient confirmed with CSW plan is to return there when medically ready for dc. CSW called Claiborne Billings with [employer] who confirmed patient can return back when medically ready for dc. CSW will continue  to follow and assist with patients dc planning needs. Expected Discharge Plan: Skilled Nursing Facility Barriers to Discharge: Continued Medical Work up   Patient Goals and CMS Choice Patient states their goals for this hospitalization and ongoing recovery are:: return to long term SNF CMS Medicare.gov Compare Post Acute Care list provided to:: Patient Choice offered to / list presented to : Patient  Expected Discharge Plan and Services Expected Discharge Plan: [place] arrangements for the past 2 months: [place]                                      Prior Living Arrangements/Services Living arrangements for the past 2 months: [place] Lives with:: Facility Resident (from [place]) Patient language and need for interpreter reviewed:: Yes Do you feel safe going back to the place where you live?: Yes      Need for Family Participation in Patient Care: Yes (Comment) Care giver support system in place?: Yes (comment)   Criminal Activity/Legal Involvement Pertinent to Current Situation/Hospitalization: No - Comment as needed  Activities of Daily Living Home Assistive Devices/Equipment: Wheelchair ADL Screening (condition at time of admission) Patient's cognitive ability adequate to safely complete daily activities?: Yes Is the patient deaf or have  difficulty hearing?: No Does the patient have difficulty seeing, even when wearing glasses/contacts?: No Does the patient have difficulty concentrating, remembering, or making decisions?: No Patient able to express need for assistance with ADLs?: Yes Does the patient have difficulty dressing or bathing?: Yes Independently performs ADLs?: No Communication: Needs assistance Is this a change from baseline?: Pre-admission baseline Dressing (OT): Needs assistance Is this a change from baseline?: Pre-admission baseline Grooming: Needs assistance Is this a change from baseline?: Pre-admission baseline Feeding: Independent Is this a change from baseline?: Pre-admission baseline Bathing: Needs assistance Is this a change from baseline?: Pre-admission baseline Toileting: Needs assistance Is this a change from baseline?: Pre-admission baseline In/Out Bed: Needs assistance Is this a change from baseline?: Pre-admission baseline Walks in Home: Needs assistance Is this a change from baseline?: Pre-admission baseline Does the patient have difficulty walking or climbing stairs?: Yes Weakness of Legs: Both Weakness of Arms/Hands: None  Permission Sought/Granted Permission sought to share information with : Case Manager, Family Supports, Chartered certified accountant granted to share information with : Yes, Verbal Permission Granted  Share Information with NAME: Rush Landmark) Gwyndolyn Saxon  Permission granted to share info w AGENCY: SNF  Permission granted to share info w Relationship: son  Permission granted to share info w Contact Information: Rush Landmark) Gwyndolyn Saxon [phone]  Emotional Assessment Appearance:: Appears stated age Attitude/Demeanor/Rapport: Gracious Affect (typically observed): Calm Orientation: : Oriented to Self, Oriented to Place, Oriented to Situation   Psych Involvement: No (comment)  Admission diagnosis:  CAP (community acquired pneumonia) [J18.9] Patient Active Problem  List   Diagnosis Date Noted   CAP (community acquired pneumonia) 06/20/2021   Leukocytosis 06/20/2021   Anxiety 06/20/2021   DNR (do not resuscitate) 06/20/2021   Acute on chronic respiratory failure with hypoxia ([place]) 06/20/2021   Thyroid function study abnormality 06/02/2021   Numbness 06/02/2021   GI bleed 07/07/2020   Acute blood loss anemia 07/03/2020   Femur fracture, left ([place]) 07/01/2020   Normocytic anemia 06/21/2018   Microcytic hypochromic anemia 06/21/2018   Fall    Subarachnoid hemorrhage ([place]) 05/25/2016   Subdural hematoma 05/25/2016   SAH (subarachnoid hemorrhage) ([place]) 05/25/2016   Laceration of face-right supraorbital 05/25/2016   Trigeminal neuralgia of right side of face 06/09/2015   Abnormality of gait 12/05/2014   Dysphagia, pharyngoesophageal phase 12/05/2014   Cholelithiasis 05/14/2014   Abdominal pain, epigastric 09/07/4386   Systolic murmur 87/57/9728   Chest pain 05/11/2014   Atypical chest pain 05/11/2014   SCA-3 (spinocerebellar ataxia type 3) ([place]) 05/13/2013   Hydronephrosis of right kidney 04/13/2013   Absent kidney, acquired 04/13/2013   Pyelonephritis 04/11/2013   AKI (acute kidney injury) ([place]) 04/11/2013   HTN (hypertension) 04/11/2013   PCP:  Pcp, No Pharmacy:   [facility] - [address] [place] [address] Phone: [phone] Fax: [phone]     Social Determinants of Health (SDOH) Interventions    Readmission Risk Interventions No flowsheet data found.

## 2021-06-22 NOTE — Progress Notes (Signed)
Speech Language Pathology Treatment: Dysphagia  Patient Details Name: Wendy Erickson MRN: [id number] DOB: [birth year] Today's Date: 06/22/2021 Time: 3888-7579 SLP Time Calculation (min) (ACUTE ONLY): 12 min  Assessment / Plan / Recommendation Clinical Impression  Pt doing well with PO intake today.  Able to follow basic precautions with only one initial cue (one sip at a time; avoid talking with POs in mouth due to incoordination related to ataxia). There were no overt s/s of aspiration noted during our session.  RN reports good toleration of pills.  Pt looking forward to returning to [facility]. No further SLP f/u is needed. Our service will sign off.   HPI HPI: Wendy Erickson is a 80 y.o. female who presented to the ED from SNF due to worsening shortness of breath. Dx acute on chronic respiratory failure with hypoxia secondary to suspected pna.  Prior medical history significant for hypertension, hyperlipidemia, peripheral neuropathy, left renal neoplasm, cerebellar ataxia, wheelchair-bound, and on chronic 3 L of oxygen. Hx of pharyngoesophageal dysphagia 2016- chart review revealed  MBS in May 2016 with mild dysphagia and functional airway protection. MBS August 2019 with no real change. Notable on both exams was concern for a primary esophageal dysphagia. Barium swallow/esophagram was recommended at the time. Chart review reveals EGD December 2021- esophagus was normal.      SLP Plan  All goals met      Recommendations for follow up therapy are one component of a multi-disciplinary discharge planning process, led by the attending physician.  Recommendations may be updated based on patient status, additional functional criteria and insurance authorization.    Recommendations  Diet recommendations: Regular;Thin liquid Liquids provided via: Straw Medication Administration: Whole meds with liquid Supervision: Trained caregiver to feed patient Compensations: Slow rate;Small sips/bites Postural  Changes and/or Swallow Maneuvers: Seated upright 90 degrees                Oral Care Recommendations: Oral care BID Follow Up Recommendations: No SLP follow up Assistance recommended at discharge: Frequent or constant Supervision/Assistance SLP Visit Diagnosis: Dysphagia, unspecified (R13.10) Plan: All goals met       Herron Fero L. Tivis Ringer, [place] Office number [phone] Pager [phone]                 Juan Quam Laurice  06/22/2021, 3:29 PM

## 2021-06-23 LAB — CBC
HCT: 34.5 % — ABNORMAL LOW (ref 36.0–46.0)
Hemoglobin: 10.9 g/dL — ABNORMAL LOW (ref 12.0–15.0)
MCH: 30.5 pg (ref 26.0–34.0)
MCHC: 31.6 g/dL (ref 30.0–36.0)
MCV: 96.6 fL (ref 80.0–100.0)
Platelets: 302 10*3/uL (ref 150–400)
RBC: 3.57 MIL/uL — ABNORMAL LOW (ref 3.87–5.11)
RDW: 14.6 % (ref 11.5–15.5)
WBC: 10.8 10*3/uL — ABNORMAL HIGH (ref 4.0–10.5)
nRBC: 0 % (ref 0.0–0.2)

## 2021-06-23 LAB — BASIC METABOLIC PANEL
Anion gap: 12 (ref 5–15)
BUN: 41 mg/dL — ABNORMAL HIGH (ref 8–23)
CO2: 36 mmol/L — ABNORMAL HIGH (ref 22–32)
Calcium: 8.9 mg/dL (ref 8.9–10.3)
Chloride: 87 mmol/L — ABNORMAL LOW (ref 98–111)
Creatinine, Ser: 1.01 mg/dL — ABNORMAL HIGH (ref 0.44–1.00)
GFR, Estimated: 57 mL/min — ABNORMAL LOW (ref >60)
Glucose, Bld: 95 mg/dL (ref 70–99)
Potassium: 3.8 mmol/L (ref 3.5–5.1)
Sodium: 135 mmol/L (ref 135–145)

## 2021-06-23 MED ORDER — FUROSEMIDE 40 MG PO TABS
40.0000 mg | ORAL_TABLET | Freq: Every day | ORAL | Status: DC
  Administered 2021-06-23 – 2021-06-24 (×2): 40 mg via ORAL
  Filled 2021-06-23 (×2): qty 1

## 2021-06-23 NOTE — Progress Notes (Signed)
PROGRESS NOTE  Wendy Erickson  TDD:[id number] DOB: November 13, 1940 DOA: 06/20/2021 PCP: Merryl Hacker, No  Outpatient Specialists: Neurology, Dr. Jannifer Franklin; Endocrinology, Dr. Loanne Drilling; Pulmonology, Dr. Annamaria Boots.  Brief Narrative: Wendy Erickson is a 80 y.o. female with a history of spinocerebellar ataxia, HTN, HLD, left RCC s/p nephrectomy, chronic 3L O2-dependence who presented to the ED from facility due to dyspnea and cough. She was noted to require 6L O2 to maintain oxygenation, and appeared lethargic which improved with narcan. D-dimer equivocal and subsequent CTA chest showed no PE. There was evidence of mild-moderate pulmonary edema, small-moderate pleural effusions. Echocardiogram revealed evidence of elevated RA pressure, LVEF presreved, LVH with diastolic function indeterminate. Lasix and antibiotics given. SLP evaluation requested, felt patient at mild aspiration risk.  Assessment & Plan: Principal Problem:   Acute on chronic respiratory failure with hypoxia (HCC) Active Problems:   HTN (hypertension)   Dysphagia, pharyngoesophageal phase   Normocytic anemia   CAP (community acquired pneumonia)   Leukocytosis   Anxiety   DNR (do not resuscitate)  Acute on chronic hypoxic respiratory failure:  - BNP, troponin, PCT all reassuring, though WBC elevated and CT suggestive of pulmonary edema.  - SLP evaluation performed, no follow up needed. Pt requires assistance feeding but is at mild aspiration risk.  - Remains on 8L O2, will plan to wean back to 3L O2 as tolerated.  Saturating at 100% at bedside this morning.  Did have some documented oxygen saturations of 88% overnight.  Unclear why her oxygen was bumped up right from 3 L to 8 L overnight.  Plan to discuss with nursing to try and gradually wean her down to her baseline oxygen for oxygen saturations greater than 89%.  Acute HFpEF:  - Troponin wnl and had nonischemic myoview 2015.  - Repeat echo revealed preserved LVEF, asymmetric LVH, indeterminate  diastolic function, dilated and blunted IVC. Interstitial edema with pleural effusions on CT chest consistent with pulmonary edema.  - Increase lasix 40mg  IV to BID.  - Trend BUN:Cr (improved), monitor I/O, weights. - Weight appears inaccurate/incompletely charted.  Down from 164 pounds on admission to 160 pounds.  Intake output also likely not complete and -1.59 L documented thus far. - Clinically appears euvolemic and will transition to oral Lasix 40 mg daily.  Possible pneumonia:  - Continue antibiotics for a maximum of 5 days.  Day 4 antibiotics today.  AKI:  - Possibly related to fluid overload.  - Stop lisinopril, was given this AM.  - Monitor with diuresis.  Creatinine stable.  AKI resolved.  Spinocerebellar ataxia - type III:  - Wheelchair bound, followed by Dr. Jannifer Franklin until 2020 when prn follow up was recommended. - Continue gabapentin.   Anxiety/depression:  - Continue buspar, paroxetine, remeron  HLD:  - Continue statin  Iron deficiency anemia:  - Continue ferrous sulfate, bowel regimen.  Stable.  HTN:  - Continue lisinopril, clonidine, prn hydralazine.  Controlled.   Trigeminal neuralgia:  - Continue gabapentin. Stopped carbamazepine previously due to anemia  RML pulmonary nodule: 4mm - Needs repeat CT in 6-12 months from scan on 06/20/2021.  This must be followed up as outpatient.  DVT prophylaxis: Lovenox 40mg  Code Status: DNR Family Communication: None at bedside Disposition Plan:  Status is: Inpatient  Remains inpatient appropriate because: Persistently hypoxemic not at baseline.  Consultants:  None  Procedures:  Echocardiogram 06/21/2021:   1. Left ventricular ejection fraction, by estimation, is 60 to 65%. The  left ventricle has normal function. The left ventricle has no regional  wall motion abnormalities. There is moderate asymmetric left ventricular  hypertrophy of the basal-septal  segment. Left ventricular diastolic parameters are  indeterminate.   2. Right ventricular systolic function is normal. The right ventricular  size is mildly enlarged. Tricuspid regurgitation signal is inadequate for  assessing PA pressure.   3. A small pericardial effusion is present. The pericardial effusion is  circumferential.   4. The mitral valve is normal in structure. Mild mitral valve  regurgitation. No evidence of mitral stenosis.   5. The aortic valve is normal in structure. Aortic valve regurgitation is  mild. Aortic valve sclerosis is present, with no evidence of aortic valve  stenosis.   6. The inferior vena cava is dilated in size with <50% respiratory  variability, suggesting right atrial pressure of 15 mmHg.  Antimicrobials: Ceftriaxone, azithromycin   Subjective: Denies complaints.  Reports that she slept well last night.  Denies dyspnea, chest pain, cough.  Objective: Vitals:   06/22/21 1957 06/22/21 2000 06/22/21 2145 06/23/21 0500  BP: (!) 152/68 (!) 152/68  (!) 147/79  Pulse: 69 70 66 61  Resp: 11 15 18 18   Temp: 97.8 F (36.6 C)   97.6 F (36.4 C)  TempSrc: Oral   Oral  SpO2: 98% 95% 99% 100%  Weight:    73 kg  Height:        Intake/Output Summary (Last 24 hours) at 06/23/2021 0841 Last data filed at 06/23/2021 0806 Gross per 24 hour  Intake 629.98 ml  Output 2050 ml  Net -1420.02 ml   Filed Weights   06/20/21 1849 06/22/21 0441 06/23/21 0500  Weight: 74.4 kg 74.2 kg 73 kg   Gen: Chronically ill-appearing female in no acute distress.  Appears to be in good spirits. Pulm: Slightly diminished breath sounds in the bases but otherwise clear to auscultation.  No increased work of breathing.  However noted on 8 L/min oxygen via nasal cannula and saturating 100% at bedside. CV: Regular rate and rhythm. No murmur, rub, or gallop. No JVD.  Trace or no ankle edema.  Telemetry personally reviewed: Sinus rhythm. GI: Abdomen soft, non-tender, non-distended, with normoactive bowel sounds.  Ext: Warm,  incomplete quadriparesis noted. Stable. Feet with deformities bilaterally. No open wounds.  Grade 2-3 power in lower extremities.  Barely able to lift her distal lower extremities above bed. Skin: No new rashes, lesions or ulcers on visualized skin. Neuro: Alert and oriented. Stable significant deficits without any new focal neurological deficits. Psych: Judgement and insight appear fair. Mood euthymic & affect congruent. Behavior is appropriate.    Data Reviewed: I have personally reviewed following labs and imaging studies  CBC: Recent Labs  Lab 06/20/21 1302 06/20/21 1329 06/20/21 1330 06/22/21 0320 06/23/21 0205  WBC 11.8*  --   --  11.0* 10.8*  NEUTROABS 7.9*  --   --  7.8*  --   HGB 11.8* 11.9* 12.2 10.6* 10.9*  HCT 37.9 35.0* 36.0 33.6* 34.5*  MCV 98.4  --   --  97.7 96.6  PLT 324  --   --  305 010   Basic Metabolic Panel: Recent Labs  Lab 06/20/21 1302 06/20/21 1329 06/20/21 1330 06/22/21 0320 06/23/21 0205  NA 141 140 142 139 135  K 3.6 3.8 3.8 4.0 3.8  CL 94*  --  97* 94* 87*  CO2 34*  --   --  37* 36*  GLUCOSE 127*  --  128* 97 95  BUN 33*  --  40* 39* 41*  CREATININE 1.05*  --  1.20* 0.95 1.01*  CALCIUM 9.8  --   --  9.1 8.9  MG 1.9  --   --   --   --    GFR: Estimated Creatinine Clearance: 45.9 mL/min (A) (by C-G formula based on SCr of 1.01 mg/dL (H)). Liver Function Tests: Recent Labs  Lab 06/20/21 1302  AST 18  ALT 14  ALKPHOS 69  BILITOT 0.5  PROT 6.6  ALBUMIN 3.3*    Thyroid Function Tests: Recent Labs    06/20/21 1309  TSH 2.435    Urine analysis:    Component Value Date/Time   COLORURINE YELLOW 06/20/2021 1303   APPEARANCEUR CLOUDY (A) 06/20/2021 1303   LABSPEC 1.012 06/20/2021 1303   PHURINE 6.0 06/20/2021 1303   GLUCOSEU NEGATIVE 06/20/2021 1303   HGBUR SMALL (A) 06/20/2021 1303   BILIRUBINUR NEGATIVE 06/20/2021 1303   KETONESUR NEGATIVE 06/20/2021 1303   PROTEINUR 100 (A) 06/20/2021 1303   UROBILINOGEN 0.2 10/29/2014 0816    NITRITE POSITIVE (A) 06/20/2021 1303   LEUKOCYTESUR TRACE (A) 06/20/2021 1303   No results found for this or any previous visit (from the past 240 hour(s)).    Radiology Studies: ECHOCARDIOGRAM COMPLETE  Result Date: 06/21/2021    ECHOCARDIOGRAM REPORT   Patient Name:   Wendy Erickson Date of Exam: 06/21/2021 Medical Rec #:  [id number]     Height:       65.0 in Accession #:    [id number]    Weight:       164.0 lb Date of Birth:  [birth year]     BSA:          1.818 m Patient Age:    65 years      BP:           150/61 mmHg Patient Gender: F             HR:           68 bpm. Exam Location:  Inpatient Procedure: 2D Echo, Cardiac Doppler, Color Doppler and Intracardiac            Opacification Agent Indications:    Respiratory distress  History:        Patient has no prior history of Echocardiogram examinations.                 Risk Factors:Hypertension.  Sonographer:    Jyl Heinz Referring Phys: [place]  1. Left ventricular ejection fraction, by estimation, is 60 to 65%. The left ventricle has normal function. The left ventricle has no regional wall motion abnormalities. There is moderate asymmetric left ventricular hypertrophy of the basal-septal segment. Left ventricular diastolic parameters are indeterminate.  2. Right ventricular systolic function is normal. The right ventricular size is mildly enlarged. Tricuspid regurgitation signal is inadequate for assessing PA pressure.  3. A small pericardial effusion is present. The pericardial effusion is circumferential.  4. The mitral valve is normal in structure. Mild mitral valve regurgitation. No evidence of mitral stenosis.  5. The aortic valve is normal in structure. Aortic valve regurgitation is mild. Aortic valve sclerosis is present, with no evidence of aortic valve stenosis.  6. The inferior vena cava is dilated in size with <50% respiratory variability, suggesting right atrial pressure of 15 mmHg. FINDINGS  Left Ventricle: Left  ventricular ejection fraction, by estimation, is 60 to 65%. The left ventricle has normal function. The left ventricle has no regional wall motion abnormalities. The left ventricular internal  cavity size was normal in size. There is  moderate asymmetric left ventricular hypertrophy of the basal-septal segment. Left ventricular diastolic parameters are indeterminate. Right Ventricle: The right ventricular size is mildly enlarged. No increase in right ventricular wall thickness. Right ventricular systolic function is normal. Tricuspid regurgitation signal is inadequate for assessing PA pressure. Left Atrium: Left atrial size was normal in size. Right Atrium: Right atrial size was normal in size. Pericardium: A small pericardial effusion is present. The pericardial effusion is circumferential. Presence of epicardial fat layer. Mitral Valve: The mitral valve is normal in structure. Mild mitral valve regurgitation. No evidence of mitral valve stenosis. Tricuspid Valve: The tricuspid valve is normal in structure. Tricuspid valve regurgitation is trivial. No evidence of tricuspid stenosis. Aortic Valve: The aortic valve is normal in structure. Aortic valve regurgitation is mild. Aortic valve sclerosis is present, with no evidence of aortic valve stenosis. Aortic valve peak gradient measures 15.1 mmHg. Pulmonic Valve: The pulmonic valve was normal in structure. Pulmonic valve regurgitation is not visualized. No evidence of pulmonic stenosis. Aorta: The aortic root is normal in size and structure. Venous: The inferior vena cava is dilated in size with less than 50% respiratory variability, suggesting right atrial pressure of 15 mmHg. IAS/Shunts: No atrial level shunt detected by color flow Doppler.  LEFT VENTRICLE PLAX 2D LVIDd:         4.10 cm      Diastology LVIDs:         2.40 cm      LV e' medial:    3.70 cm/s LV PW:         1.15 cm      LV E/e' medial:  18.2 LV IVS:        1.40 cm      LV e' lateral:   4.03 cm/s LVOT  diam:     2.00 cm      LV E/e' lateral: 16.7 LV SV:         120 LV SV Index:   66 LVOT Area:     3.14 cm  LV Volumes (MOD) LV vol d, MOD A2C: 104.0 ml LV vol d, MOD A4C: 114.0 ml LV vol s, MOD A2C: 31.9 ml LV vol s, MOD A4C: 37.5 ml LV SV MOD A2C:     72.1 ml LV SV MOD A4C:     114.0 ml LV SV MOD BP:      76.3 ml RIGHT VENTRICLE             IVC RV Basal diam:  4.30 cm     IVC diam: 2.20 cm RV S prime:     10.90 cm/s TAPSE (M-mode): 2.4 cm LEFT ATRIUM           Index        RIGHT ATRIUM           Index LA diam:      2.30 cm 1.26 cm/m   RA Area:     13.70 cm LA Vol (A2C): 29.7 ml 16.33 ml/m  RA Volume:   34.50 ml  18.97 ml/m LA Vol (A4C): 34.8 ml 19.14 ml/m  AORTIC VALVE AV Area (Vmax): 2.91 cm AV Vmax:        194.00 cm/s AV Peak Grad:   15.1 mmHg LVOT Vmax:      180.00 cm/s LVOT Vmean:     120.000 cm/s LVOT VTI:       0.383 m  AORTA Ao Root diam: 2.80 cm  Ao Asc diam:  2.60 cm MITRAL VALVE MV Area (PHT): 3.60 cm     SHUNTS MV Decel Time: 211 msec     Systemic VTI:  0.38 m MV E velocity: 67.30 cm/s   Systemic Diam: 2.00 cm MV A velocity: 106.00 cm/s MV E/A ratio:  0.63 Kardie Tobb DO Electronically signed by Berniece Salines DO Signature Date/Time: 06/21/2021/11:52:29 AM    Final     Scheduled Meds:  atorvastatin  40 mg Oral QHS   azithromycin  500 mg Oral Daily   busPIRone  7.5 mg Oral TID   cloNIDine  0.2 mg Oral BID   enoxaparin (LOVENOX) injection  40 mg Subcutaneous Q24H   ferrous sulfate  325 mg Oral Q breakfast   furosemide  40 mg Intravenous BID   gabapentin  600 mg Oral TID   loratadine  10 mg Oral Daily   magic mouthwash  10 mL Oral BID   mirtazapine  7.5 mg Oral QHS   Muscle Rub   Topical QHS   PARoxetine  37.5 mg Oral QHS   polyethylene glycol  17 g Oral Daily   senna  1 tablet Oral BID   Continuous Infusions:  cefTRIAXone (ROCEPHIN)  IV Stopped (06/22/21 1746)     LOS: 3 days   Time spent: 35 minutes.  Vernell Leep, MD, [place], [facility]. [facility]  To contact the  attending provider between 7A-7P or the covering provider during after hours 7P-7A, please log into the web site [email or web address] and access using universal [place] password for that web site. If you do not have the password, please call the hospital operator.

## 2021-06-23 NOTE — TOC Progression Note (Signed)
Transition of Care [facility]) - Progression Note    Patient Details  Name: Wendy Erickson MRN: [id number] Date of Birth: January 07, 1941  Transition of Care [facility] CM/SW [place], [place] Phone Number: 06/23/2021, 10:25 AM  Clinical Narrative:     Patient is from [facility] long term. Plan is to return when medically ready for dc. CSW will continue to follow and assist with patients dc planning needs.  Expected Discharge Plan: [place] Barriers to Discharge: Continued Medical Work up  Expected Discharge Plan and Services Expected Discharge Plan: [place] arrangements for the past 2 months: [place]                                       Social Determinants of Health (SDOH) Interventions    Readmission Risk Interventions No flowsheet data found.

## 2021-06-24 LAB — BASIC METABOLIC PANEL
Anion gap: 13 (ref 5–15)
BUN: 50 mg/dL — ABNORMAL HIGH (ref 8–23)
CO2: 33 mmol/L — ABNORMAL HIGH (ref 22–32)
Calcium: 9.3 mg/dL (ref 8.9–10.3)
Chloride: 91 mmol/L — ABNORMAL LOW (ref 98–111)
Creatinine, Ser: 1.11 mg/dL — ABNORMAL HIGH (ref 0.44–1.00)
GFR, Estimated: 51 mL/min — ABNORMAL LOW (ref >60)
Glucose, Bld: 83 mg/dL (ref 70–99)
Potassium: 4 mmol/L (ref 3.5–5.1)
Sodium: 137 mmol/L (ref 135–145)

## 2021-06-24 NOTE — Care Management Important Message (Signed)
Important Message  Patient Details  Name: Wendy Erickson MRN: [id number] Date of Birth: 06-26-1941   Medicare Important Message Given:  Yes     Shelda Altes 06/24/2021, 9:01 AM

## 2021-06-24 NOTE — Progress Notes (Signed)
PROGRESS NOTE  Wendy Erickson  WSF:[id number] DOB: September 21, 1940 DOA: 06/20/2021 PCP: Merryl Hacker, No  Outpatient Specialists: Neurology, Dr. Jannifer Franklin; Endocrinology, Dr. Loanne Drilling; Pulmonology, Dr. Annamaria Boots.  Brief Narrative: Wendy Erickson is a 80 y.o. female with a history of spinocerebellar ataxia, HTN, HLD, left RCC s/p nephrectomy, chronic 3L O2-dependence who presented to the ED from facility due to dyspnea and cough. She was noted to require 6L O2 to maintain oxygenation, and appeared lethargic which improved with narcan. D-dimer equivocal and subsequent CTA chest showed no PE. There was evidence of mild-moderate pulmonary edema, small-moderate pleural effusions. Echocardiogram revealed evidence of elevated RA pressure, LVEF presreved, LVH with diastolic function indeterminate. Lasix and antibiotics given. SLP evaluation requested, felt patient at mild aspiration risk.  Assessment & Plan: Principal Problem:   Acute on chronic respiratory failure with hypoxia (HCC) Active Problems:   HTN (hypertension)   Dysphagia, pharyngoesophageal phase   Normocytic anemia   CAP (community acquired pneumonia)   Leukocytosis   Anxiety   DNR (do not resuscitate)  Acute on chronic hypoxic respiratory failure:  - BNP, troponin, PCT all reassuring, though WBC elevated and CT suggestive of pulmonary edema.  - SLP evaluation performed, no follow up needed. Pt requires assistance feeding but is at mild aspiration risk.  -Patient chronically on home oxygen 3 L/min via nasal cannula.  It is really unclear as to her oxygen requirement in the hospital over the last 2 days.  There has been inadequate documentation in the chart from nursing despite reminders.  Was on HFNC 8 L/min at 4:01 AM on 11/16 and then there was no documentation until 2:01 PM at which time the patient was 95% on room air, no further oxygen saturations dropped less than 91% but back on HFNC 8 L/min at 8:01 PM.  This morning I personally noted that she was  saturating at 100% on 7 L/min HFNC at bedside and turned down the oxygen to 3 L/min and personally discussed with patient's nursing and charge nurse to reassess and try and wean her down to her baseline oxygen if possible for saturations greater than 89%.  However there is no further documentation since this morning.  Floor assistant nursing director is also aware of this issue. - We will follow up on chest x-ray today.  Acute HFpEF:  - Troponin wnl and had nonischemic myoview 2015.  - Repeat echo revealed preserved LVEF, asymmetric LVH, indeterminate diastolic function, dilated and blunted IVC. Interstitial edema with pleural effusions on CT chest consistent with pulmonary edema.  -Treated with lasix 40mg  IV to BID.  - Weight appears inaccurate/incompletely charted.  Down from 164 pounds on admission to 157.41 pounds.  Intake output also likely not complete and -1. 6 4 L documented thus far. - Clinically appears euvolemic and transitioned to oral Lasix 40 mg daily today.  Possible pneumonia:  - Continue antibiotics for a maximum of 5 days.  Day 5 antibiotics today.  AKI:  - Possibly related to fluid overload.  - Stop lisinopril -Creatinine has been gradually creeping up, likely related to IV Lasix.  Discontinue Lasix.  Follow BMP in AM.  Spinocerebellar ataxia - type III:  - Wheelchair bound, followed by Dr. Jannifer Franklin until 2020 when prn follow up was recommended. - Continue gabapentin.   Anxiety/depression:  - Continue buspar, paroxetine, remeron  HLD:  - Continue statin  Iron deficiency anemia:  - Continue ferrous sulfate, bowel regimen.  Stable.  HTN:  - Continue clonidine, prn hydralazine.  Lisinopril has been temporarily held.  Controlled.   Trigeminal neuralgia:  - Continue gabapentin. Stopped carbamazepine previously due to anemia  RML pulmonary nodule: 26mm - Needs repeat CT in 6-12 months from scan on 06/20/2021.  This must be followed up as outpatient.  DVT prophylaxis:  Lovenox 40mg  Code Status: DNR Family Communication: None at bedside Disposition Plan:  Status is: Inpatient  Remains inpatient appropriate because: Persistently hypoxemic not at baseline.  Consultants:  None  Procedures:  Echocardiogram 06/21/2021:   1. Left ventricular ejection fraction, by estimation, is 60 to 65%. The  left ventricle has normal function. The left ventricle has no regional  wall motion abnormalities. There is moderate asymmetric left ventricular  hypertrophy of the basal-septal  segment. Left ventricular diastolic parameters are indeterminate.   2. Right ventricular systolic function is normal. The right ventricular  size is mildly enlarged. Tricuspid regurgitation signal is inadequate for  assessing PA pressure.   3. A small pericardial effusion is present. The pericardial effusion is  circumferential.   4. The mitral valve is normal in structure. Mild mitral valve  regurgitation. No evidence of mitral stenosis.   5. The aortic valve is normal in structure. Aortic valve regurgitation is  mild. Aortic valve sclerosis is present, with no evidence of aortic valve  stenosis.   6. The inferior vena cava is dilated in size with <50% respiratory  variability, suggesting right atrial pressure of 15 mmHg.  Antimicrobials: Ceftriaxone, azithromycin   Subjective: States that a friend from [place] had come to visit last night and patient is eager for discharge.  Reports cough with?  Sputum.  Denies dyspnea or chest pain.  Objective: Vitals:   06/24/21 0416 06/24/21 0737 06/24/21 1024 06/24/21 1223  BP: (!) 145/57  111/77 134/67  Pulse: 65 62  69  Resp: 18 12  12   Temp: 97.7 F (36.5 C)   97.9 F (36.6 C)  TempSrc: Oral   Oral  SpO2: 97% 100%  93%  Weight: 71.4 kg     Height:        Intake/Output Summary (Last 24 hours) at 06/24/2021 1512 Last data filed at 06/24/2021 0800 Gross per 24 hour  Intake 240 ml  Output 300 ml  Net -60 ml   Filed Weights    06/22/21 0441 06/23/21 0500 06/24/21 0416  Weight: 74.2 kg 73 kg 71.4 kg   Gen: Elderly female, small built and frail, chronically ill looking, lying propped up in bed.  Appears comfortable. Pulm: Diminished breath sounds in the right base but otherwise clear to auscultation.  No increased work of breathing. CV: S1 and S2 heard, RRR.  No JVD.  Systolic 2/6 murmur at apex.  No pedal edema.  Telemetry personally reviewed: Sinus rhythm.  Occasional sinus bradycardia in the 50s. GI: Abdomen soft, non-tender, non-distended, with normoactive bowel sounds.  Ext: Warm, incomplete quadriparesis noted. Stable. Feet with deformities bilaterally. No open wounds.  Grade 2-3 power in lower extremities.  Barely able to lift her distal lower extremities above bed. Skin: No new rashes, lesions or ulcers on visualized skin. Neuro: Alert and oriented. Stable significant deficits without any new focal neurological deficits. Psych: Judgement and insight appear fair. Mood euthymic & affect congruent. Behavior is appropriate.    Data Reviewed: I have personally reviewed following labs and imaging studies  CBC: Recent Labs  Lab 06/20/21 1302 06/20/21 1329 06/20/21 1330 06/22/21 0320 06/23/21 0205  WBC 11.8*  --   --  11.0* 10.8*  NEUTROABS 7.9*  --   --  7.8*  --   HGB 11.8* 11.9* 12.2 10.6* 10.9*  HCT 37.9 35.0* 36.0 33.6* 34.5*  MCV 98.4  --   --  97.7 96.6  PLT 324  --   --  305 094   Basic Metabolic Panel: Recent Labs  Lab 06/20/21 1302 06/20/21 1329 06/20/21 1330 06/22/21 0320 06/23/21 0205 06/24/21 0308  NA 141 140 142 139 135 137  K 3.6 3.8 3.8 4.0 3.8 4.0  CL 94*  --  97* 94* 87* 91*  CO2 34*  --   --  37* 36* 33*  GLUCOSE 127*  --  128* 97 95 83  BUN 33*  --  40* 39* 41* 50*  CREATININE 1.05*  --  1.20* 0.95 1.01* 1.11*  CALCIUM 9.8  --   --  9.1 8.9 9.3  MG 1.9  --   --   --   --   --    GFR: Estimated Creatinine Clearance: 41.4 mL/min (A) (by C-G formula based on SCr of 1.11 mg/dL  (H)). Liver Function Tests: Recent Labs  Lab 06/20/21 1302  AST 18  ALT 14  ALKPHOS 69  BILITOT 0.5  PROT 6.6  ALBUMIN 3.3*    Thyroid Function Tests: No results for input(s): TSH, T4TOTAL, FREET4, T3FREE, THYROIDAB in the last 72 hours.   Urine analysis:    Component Value Date/Time   COLORURINE YELLOW 06/20/2021 1303   APPEARANCEUR CLOUDY (A) 06/20/2021 1303   LABSPEC 1.012 06/20/2021 1303   PHURINE 6.0 06/20/2021 1303   GLUCOSEU NEGATIVE 06/20/2021 1303   HGBUR SMALL (A) 06/20/2021 1303   BILIRUBINUR NEGATIVE 06/20/2021 1303   KETONESUR NEGATIVE 06/20/2021 1303   PROTEINUR 100 (A) 06/20/2021 1303   UROBILINOGEN 0.2 10/29/2014 0816   NITRITE POSITIVE (A) 06/20/2021 1303   LEUKOCYTESUR TRACE (A) 06/20/2021 1303   No results found for this or any previous visit (from the past 240 hour(s)).    Radiology Studies: No results found.  Scheduled Meds:  atorvastatin  40 mg Oral QHS   busPIRone  7.5 mg Oral TID   cloNIDine  0.2 mg Oral BID   enoxaparin (LOVENOX) injection  40 mg Subcutaneous Q24H   ferrous sulfate  325 mg Oral Q breakfast   furosemide  40 mg Oral Daily   gabapentin  600 mg Oral TID   loratadine  10 mg Oral Daily   magic mouthwash  10 mL Oral BID   mirtazapine  7.5 mg Oral QHS   Muscle Rub   Topical QHS   PARoxetine  37.5 mg Oral QHS   polyethylene glycol  17 g Oral Daily   senna  1 tablet Oral BID   Continuous Infusions:  cefTRIAXone (ROCEPHIN)  IV 2 g (06/23/21 1715)     LOS: 4 days   Time spent: 35 minutes.  Vernell Leep, MD, [place], [facility]. [facility]  To contact the attending provider between 7A-7P or the covering provider during after hours 7P-7A, please log into the web site [email or web address] and access using universal [place] password for that web site. If you do not have the password, please call the hospital operator.

## 2021-06-25 ENCOUNTER — Inpatient Hospital Stay (HOSPITAL_COMMUNITY): Payer: Medicare Other

## 2021-06-25 DIAGNOSIS — N179 Acute kidney failure, unspecified: Secondary | ICD-10-CM

## 2021-06-25 LAB — BASIC METABOLIC PANEL
Anion gap: 8 (ref 5–15)
Anion gap: 10 (ref 5–15)
BUN: 55 mg/dL — ABNORMAL HIGH (ref 8–23)
BUN: 55 mg/dL — ABNORMAL HIGH (ref 8–23)
CO2: 36 mmol/L — ABNORMAL HIGH (ref 22–32)
CO2: 36 mmol/L — ABNORMAL HIGH (ref 22–32)
Calcium: 8.7 mg/dL — ABNORMAL LOW (ref 8.9–10.3)
Calcium: 8.8 mg/dL — ABNORMAL LOW (ref 8.9–10.3)
Chloride: 93 mmol/L — ABNORMAL LOW (ref 98–111)
Chloride: 93 mmol/L — ABNORMAL LOW (ref 98–111)
Creatinine, Ser: 1.79 mg/dL — ABNORMAL HIGH (ref 0.44–1.00)
Creatinine, Ser: 1.7 mg/dL — ABNORMAL HIGH (ref 0.44–1.00)
GFR, Estimated: 29 mL/min — ABNORMAL LOW (ref >60)
GFR, Estimated: 31 mL/min — ABNORMAL LOW (ref >60)
Glucose, Bld: 106 mg/dL — ABNORMAL HIGH (ref 70–99)
Glucose, Bld: 93 mg/dL (ref 70–99)
Potassium: 3.9 mmol/L (ref 3.5–5.1)
Potassium: 4 mmol/L (ref 3.5–5.1)
Sodium: 137 mmol/L (ref 135–145)
Sodium: 139 mmol/L (ref 135–145)

## 2021-06-25 MED ORDER — ENOXAPARIN SODIUM 30 MG/0.3ML IJ SOSY
30.0000 mg | PREFILLED_SYRINGE | INTRAMUSCULAR | Status: DC
  Administered 2021-06-25 – 2021-06-27 (×3): 30 mg via SUBCUTANEOUS
  Filled 2021-06-25 (×3): qty 0.3

## 2021-06-25 MED ORDER — LACTATED RINGERS IV SOLN: INTRAVENOUS | Status: AC

## 2021-06-25 NOTE — Progress Notes (Addendum)
PROGRESS NOTE  Mardee Clune  JIR:[id number] DOB: 1941-06-05 DOA: 06/20/2021 PCP: Merryl Hacker, No  Outpatient Specialists: Neurology, Dr. Jannifer Franklin; Endocrinology, Dr. Loanne Drilling; Pulmonology, Dr. Annamaria Boots.  Brief Narrative: Shamieka Gullo is a 80 y.o. female with a history of spinocerebellar ataxia, HTN, HLD, left RCC s/p nephrectomy, chronic 3L O2-dependence who presented to the ED from facility due to dyspnea and cough. She was noted to require 6L O2 to maintain oxygenation, and appeared lethargic which improved with narcan. D-dimer equivocal and subsequent CTA chest showed no PE. There was evidence of mild-moderate pulmonary edema, small-moderate pleural effusions. Echocardiogram revealed evidence of elevated RA pressure, LVEF presreved, LVH with diastolic function indeterminate. Lasix and antibiotics given. SLP evaluation requested, felt patient at mild aspiration risk.  Progressive AKI, Lasix discontinued, gentle IVF.  Patient is s/p left nephrectomy.  Assessment & Plan: Principal Problem:   Acute on chronic respiratory failure with hypoxia (HCC) Active Problems:   HTN (hypertension)   Dysphagia, pharyngoesophageal phase   Normocytic anemia   CAP (community acquired pneumonia)   Leukocytosis   Anxiety   DNR (do not resuscitate)  Acute on chronic hypoxic respiratory failure:  - BNP, troponin, PCT all reassuring, though WBC elevated and CT suggestive of pulmonary edema.  - SLP evaluation performed, no follow up needed. Pt requires assistance feeding but is at mild aspiration risk.  -Patient chronically on home oxygen 3 L/min via nasal cannula.  Currently weaned down from 8 L/min HFNC to 3 L/min and mostly maintaining oxygen saturations in the low 90s.  As per discussion with nursing, desaturates quickly when nasal cannula oxygen dislodges. - Chest x-ray 11/18 personally reviewed: Chronic atelectasis or scarring at the bases with persistent left base collapse/consolidative opacity and small left effusion.   Incentive spirometry to be encouraged as much as able. - Wonder if she has some respiratory muscle weakness related to her spinocerebellar ataxia which may also be playing a role  Acute HFpEF:  - Troponin wnl and had nonischemic myoview 2015.  - Repeat echo revealed preserved LVEF, asymmetric LVH, indeterminate diastolic function, dilated and blunted IVC. Interstitial edema with pleural effusions on CT chest consistent with pulmonary edema.  -Treated with lasix 40mg  IV to BID.  This was then transitioned to Lasix 40 mg orally daily.  Lasix discontinued due to AKI. - Weight appears inaccurate/incompletely charted.  Down from 164 pounds on admission to 157.41 pounds.  Intake output also likely not complete and -2.14 documented thus far. -Clinically euvolemic or may be even on the dry side.  Monitor closely while on gentle IV fluids.  Possible pneumonia:  - Continue antibiotics for a maximum of 5 days.  Day 5 antibiotics today.  AKI/prior left nephrectomy - Stopped lisinopril -Creatinine has gradually gone up to 1.7, verified with repeat labs.  Nonoliguric.  May be related to aggressive diuresis.  Lasix discontinued. - Gentle IV fluids at 50 mL/h x 24 hours and follow BMP in AM.  Spinocerebellar ataxia - type III:  - Wheelchair bound, followed by Dr. Jannifer Franklin until 2020 when prn follow up was recommended. - Continue gabapentin.   Anxiety/depression:  - Continue buspar, paroxetine, remeron  HLD:  - Continue statin  Iron deficiency anemia:  - Continue ferrous sulfate, bowel regimen.  Stable.  HTN:  - Continue clonidine, prn hydralazine.  Lisinopril has been temporarily held.  Controlled.   Trigeminal neuralgia:  - Continue gabapentin. Stopped carbamazepine previously due to anemia  RML pulmonary nodule: 31mm - Needs repeat CT in 6-12 months from scan on  06/20/2021.  This must be followed up as outpatient.  DVT prophylaxis: Lovenox 40mg  Code Status: DNR Family Communication:  Discussed with patient's son, updated care and answered questions. Disposition Plan:  Status is: Inpatient  Remains inpatient appropriate because: Now with new onset AKI, needs evaluation and treatment, on IV fluids.  Not medically ready for DC yet.  Consultants:  None  Procedures:  Echocardiogram 06/21/2021:   1. Left ventricular ejection fraction, by estimation, is 60 to 65%. The  left ventricle has normal function. The left ventricle has no regional  wall motion abnormalities. There is moderate asymmetric left ventricular  hypertrophy of the basal-septal  segment. Left ventricular diastolic parameters are indeterminate.   2. Right ventricular systolic function is normal. The right ventricular  size is mildly enlarged. Tricuspid regurgitation signal is inadequate for  assessing PA pressure.   3. A small pericardial effusion is present. The pericardial effusion is  circumferential.   4. The mitral valve is normal in structure. Mild mitral valve  regurgitation. No evidence of mitral stenosis.   5. The aortic valve is normal in structure. Aortic valve regurgitation is  mild. Aortic valve sclerosis is present, with no evidence of aortic valve  stenosis.   6. The inferior vena cava is dilated in size with <50% respiratory  variability, suggesting right atrial pressure of 15 mmHg.  Antimicrobials: Ceftriaxone, azithromycin   Subjective: Reports history of chronic dyspnea for several months but worse prior to admission.  Reports some persisting dyspnea even while speaking.  Denies cough or chest pain.  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  Objective: Vitals:   06/24/21 1223 06/24/21 1700 06/24/21 2024 06/25/21 0528  BP: 134/67  (!) 154/65 (!) 121/59  Pulse: 69 76 77 64  Resp: 12 15 12 12   Temp: 97.9 F (36.6 C)  98.3 F (36.8 C) 97.8 F (36.6 C)  TempSrc: Oral  Oral Oral  SpO2: 93% 90% 93% 92%  Weight:    71.4 kg  Height:        Intake/Output Summary (Last 24 hours) at  06/25/2021 1036 Last data filed at 06/25/2021 0500 Gross per 24 hour  Intake --  Output 500 ml  Net -500 ml   Filed Weights   06/23/21 0500 06/24/21 0416 06/25/21 0528  Weight: 73 kg 71.4 kg 71.4 kg   Gen: Elderly female, small built and frail, chronically ill looking, lying propped up in bed.  Appears comfortable but does appear mildly DOE while speaking. Pulm: Reduced breath sounds in the bases but no wheezing, rhonchi or crackles.  Rest of lung fields clear to auscultation.  Mild DOE while speaking.  Currently saturating in the low 90s on 3 L/min HFNC. CV: S1 and S2 heard, RRR.  No JVD, murmurs or pedal edema.  Telemetry personally reviewed: Sinus rhythm. GI: Abdomen soft, non-tender, non-distended, with normoactive bowel sounds.  Ext: Warm, incomplete quadriparesis (chronic) noted. Stable. Feet with deformities bilaterally. No open wounds.  Grade 2-3 power in lower extremities.  Barely able to lift her distal lower extremities above bed. Skin: No new rashes, lesions or ulcers on visualized skin. Neuro: Alert and oriented. Stable significant deficits without any new focal neurological deficits. Psych: Judgement and insight appear fair. Mood euthymic & affect congruent. Behavior is appropriate.    Data Reviewed: I have personally reviewed following labs and imaging studies  CBC: Recent Labs  Lab 06/20/21 1302 06/20/21 1329 06/20/21 1330 06/22/21 0320 06/23/21 0205  WBC 11.8*  --   --  11.0*  10.8*  NEUTROABS 7.9*  --   --  7.8*  --   HGB 11.8* 11.9* 12.2 10.6* 10.9*  HCT 37.9 35.0* 36.0 33.6* 34.5*  MCV 98.4  --   --  97.7 96.6  PLT 324  --   --  305 161   Basic Metabolic Panel: Recent Labs  Lab 06/20/21 1302 06/20/21 1329 06/22/21 0320 06/23/21 0205 06/24/21 0308 06/25/21 0203 06/25/21 0802  NA 141   < > 139 135 137 137 139  K 3.6   < > 4.0 3.8 4.0 3.9 4.0  CL 94*   < > 94* 87* 91* 93* 93*  CO2 34*  --  37* 36* 33* 36* 36*  GLUCOSE 127*   < > 97 95 83 106* 93   BUN 33*   < > 39* 41* 50* 55* 55*  CREATININE 1.05*   < > 0.95 1.01* 1.11* 1.79* 1.70*  CALCIUM 9.8  --  9.1 8.9 9.3 8.7* 8.8*  MG 1.9  --   --   --   --   --   --    < > = values in this interval not displayed.   GFR: Estimated Creatinine Clearance: 27 mL/min (A) (by C-G formula based on SCr of 1.7 mg/dL (H)). Liver Function Tests: Recent Labs  Lab 06/20/21 1302  AST 18  ALT 14  ALKPHOS 69  BILITOT 0.5  PROT 6.6  ALBUMIN 3.3*    Thyroid Function Tests: No results for input(s): TSH, T4TOTAL, FREET4, T3FREE, THYROIDAB in the last 72 hours.   Urine analysis:    Component Value Date/Time   COLORURINE YELLOW 06/20/2021 1303   APPEARANCEUR CLOUDY (A) 06/20/2021 1303   LABSPEC 1.012 06/20/2021 1303   PHURINE 6.0 06/20/2021 1303   GLUCOSEU NEGATIVE 06/20/2021 1303   HGBUR SMALL (A) 06/20/2021 1303   BILIRUBINUR NEGATIVE 06/20/2021 1303   KETONESUR NEGATIVE 06/20/2021 1303   PROTEINUR 100 (A) 06/20/2021 1303   UROBILINOGEN 0.2 10/29/2014 0816   NITRITE POSITIVE (A) 06/20/2021 1303   LEUKOCYTESUR TRACE (A) 06/20/2021 1303   No results found for this or any previous visit (from the past 240 hour(s)).    Radiology Studies: DG CHEST PORT 1 VIEW  Result Date: 06/25/2021 CLINICAL DATA:  Acute respiratory failure with hypoxia. EXAM: PORTABLE CHEST 1 VIEW COMPARISON:  06/20/2021 FINDINGS: Chronic atelectasis or scarring noted at the bases with persistent left base collapse/consolidative opacity and small left effusion. The cardio pericardial silhouette is enlarged. Bones are diffusely demineralized. Telemetry leads overlie the chest. IMPRESSION: Chronic atelectasis or scarring at the bases with persistent left base collapse/consolidative opacity and small left effusion. Electronically Signed   By: Misty Stanley M.D.   On: 06/25/2021 06:26    Scheduled Meds:  atorvastatin  40 mg Oral QHS   busPIRone  7.5 mg Oral TID   cloNIDine  0.2 mg Oral BID   enoxaparin (LOVENOX) injection   40 mg Subcutaneous Q24H   ferrous sulfate  325 mg Oral Q breakfast   gabapentin  600 mg Oral TID   loratadine  10 mg Oral Daily   magic mouthwash  10 mL Oral BID   mirtazapine  7.5 mg Oral QHS   Muscle Rub   Topical QHS   PARoxetine  37.5 mg Oral QHS   polyethylene glycol  17 g Oral Daily   senna  1 tablet Oral BID   Continuous Infusions:  lactated ringers       LOS: 5 days   Time spent:  35 minutes.  Vernell Leep, MD, [place], [facility]. [facility]  To contact the attending provider between 7A-7P or the covering provider during after hours 7P-7A, please log into the web site [email or web address] and access using universal [place] password for that web site. If you do not have the password, please call the hospital operator.

## 2021-06-25 NOTE — Progress Notes (Signed)
O2 sats between 77 and 88 on 3L. I bumped pt up to 6L and is now satting between 92-93

## 2021-06-25 NOTE — TOC Progression Note (Signed)
Transition of Care [facility]) - Progression Note    Patient Details  Name: Wendy Erickson MRN: [id number] Date of Birth: 18-Aug-1940  Transition of Care [facility]) CM/SW [place], [place] Phone Number: 06/25/2021, 11:00 AM  Clinical Narrative:     Patient is from [facility] long term. Plan is to return when medically ready for dc. CSW will continue to follow and assist with patients dc planning needs.  Expected Discharge Plan: [place] Barriers to Discharge: Continued Medical Work up  Expected Discharge Plan and Services Expected Discharge Plan: [place] arrangements for the past 2 months: [place]                                       Social Determinants of Health (SDOH) Interventions    Readmission Risk Interventions No flowsheet data found.

## 2021-06-26 LAB — BASIC METABOLIC PANEL
Anion gap: 8 (ref 5–15)
BUN: 63 mg/dL — ABNORMAL HIGH (ref 8–23)
CO2: 35 mmol/L — ABNORMAL HIGH (ref 22–32)
Calcium: 8.6 mg/dL — ABNORMAL LOW (ref 8.9–10.3)
Chloride: 93 mmol/L — ABNORMAL LOW (ref 98–111)
Creatinine, Ser: 1.98 mg/dL — ABNORMAL HIGH (ref 0.44–1.00)
GFR, Estimated: 25 mL/min — ABNORMAL LOW (ref >60)
Glucose, Bld: 99 mg/dL (ref 70–99)
Potassium: 3.9 mmol/L (ref 3.5–5.1)
Sodium: 136 mmol/L (ref 135–145)

## 2021-06-26 MED ORDER — LACTATED RINGERS IV SOLN: INTRAVENOUS | Status: AC

## 2021-06-26 MED ORDER — POLYVINYL ALCOHOL 1.4 % OP SOLN
2.0000 [drp] | OPHTHALMIC | Status: DC | PRN
  Administered 2021-06-26 – 2021-06-27 (×3): 2 [drp] via OPHTHALMIC
  Filled 2021-06-26: qty 15

## 2021-06-26 NOTE — Progress Notes (Signed)
PROGRESS NOTE  Wendy Erickson  LEX:[id number] DOB: 1940/10/12 DOA: 06/20/2021 PCP: Merryl Hacker, No  Outpatient Specialists: Neurology, Dr. Jannifer Franklin; Endocrinology, Dr. Loanne Drilling; Pulmonology, Dr. Annamaria Boots. Brief Narrative: Wendy Erickson is a 80 y.o. female with a history of spinocerebellar ataxia, HTN, HLD, left RCC s/p nephrectomy, chronic 3L O2-dependence who presented to the ED from facility due to dyspnea and cough. she was noted to require 6L O2 to maintain oxygenation, and appeared lethargic which was improved with narcan. D-dimer equivocal and subsequent CTA chest showed no PE. There was evidence of mild-moderate pulmonary edema, small-moderate pleural effusions. Echocardiogram revealed evidence of elevated RA pressure, LVEF preserved, LVH with diastolic function indeterminate. Lasix and antibiotics given. SLP evaluation requested, felt patient at mild aspiration risk. With continued diuresis, AKI developed, now giving gentle IVF, though hypoxia remains worse than baseline.   Assessment & Plan: Principal Problem:   Acute on chronic respiratory failure with hypoxia (HCC) Active Problems:   HTN (hypertension)   Dysphagia, pharyngoesophageal phase   Normocytic anemia   CAP (community acquired pneumonia)   Leukocytosis   Anxiety   DNR (do not resuscitate)  Acute on chronic hypoxic respiratory failure: BNP, troponin, PCT all reassuring, though WBC elevated and CT suggestive of pulmonary edema.  - SLP evaluation performed, no follow up needed. Pt requires assistance feeding but is at mild aspiration risk.  - Remains on 6L O2, will plan to wean back to 3L O2 as tolerated. ?if this represents progression of pulmonary impairment from SCA? Encouraging IS. May need pulmonary evaluation.   Acute HFpEF: Troponin wnl and had nonischemic myoview 2015.  - Repeat echo revealed preserved LVEF, asymmetric LVH, indeterminate diastolic function, dilated and blunted IVC. Interstitial edema with pleural effusions on CT  chest consistent with pulmonary edema.  - Holding further diuresis. - Monitor I/O, weights.   Possible pneumonia:  - Completed 5 days of empiric abx.  AKI, solitary kidney s/p nephrectomy:   - Hold lisinopril - Monitor with diuresis.   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 Jannifer Franklin until 2020 when prn follow up was recommended. - Continue gabapentin.   Anxiety/depression:  - Continue buspar, paroxetine, remeron  HLD:  - Continue statin  Iron deficiency anemia:  - Continue ferrous sulfate, bowel regimen.   HTN:  - Continue lisinopril, clonidine, prn hydralazine   Trigeminal neuralgia:  - Continue gabapentin. Stopped carbamazepine previously due to anemia  RML pulmonary nodule: 104mm - Needs repeat CT in 6-12 months from scan on 06/20/2021.   DVT prophylaxis: Lovenox 40mg  Code Status: DNR Family Communication: None at bedside Disposition Plan:  Status is: Inpatient  Remains inpatient appropriate because: Persistently hypoxemic not at baseline with progressive AKI.  Consultants:  None  Procedures:  Echocardiogram 06/21/2021:   1. Left ventricular ejection fraction, by estimation, is 60 to 65%. The  left ventricle has normal function. The left ventricle has no regional  wall motion abnormalities. There is moderate asymmetric left ventricular  hypertrophy of the basal-septal  segment. Left ventricular diastolic parameters are indeterminate.   2. Right ventricular systolic function is normal. The right ventricular  size is mildly enlarged. Tricuspid regurgitation signal is inadequate for  assessing PA pressure.   3. A small pericardial effusion is present. The pericardial effusion is  circumferential.   4. The mitral valve is normal in structure. Mild mitral valve  regurgitation. No evidence of mitral stenosis.   5. The aortic valve is normal in structure. Aortic valve regurgitation is  mild. Aortic valve sclerosis  is present, with no  evidence of aortic valve  stenosis.   6. The inferior vena cava is dilated in size with <50% respiratory  variability, suggesting right atrial pressure of 15 mmHg.  Antimicrobials: Ceftriaxone, azithromycin   Subjective: Breathing is still about as bad as it has been for several days. No chest pain, no fever, no new cough.   Objective: Vitals:   06/25/21 2103 06/26/21 0405 06/26/21 0441 06/26/21 1456  BP:  125/61  (!) 150/56  Pulse:  (!) 51 (!) 50 60  Resp:  10 10 11   Temp: 98.1 F (36.7 C) (!) 97.5 F (36.4 C)  98.9 F (37.2 C)  TempSrc: Oral Oral  Axillary  SpO2:  100% 100% 100%  Weight:   73.9 kg   Height:        Intake/Output Summary (Last 24 hours) at 06/26/2021 1629 Last data filed at 06/26/2021 1406 Gross per 24 hour  Intake 1423.04 ml  Output 900 ml  Net 523.04 ml   Filed Weights   06/24/21 0416 06/25/21 0528 06/26/21 0441  Weight: 71.4 kg 71.4 kg 73.9 kg   Gen: 80 y.o. female in no distress Pulm: Nonlabored breathing 6L O2, crackles at bases. CV: Regular rate and rhythm. No murmur, rub, or gallop. No JVD, trace pitting dependent edema. GI: Abdomen soft, non-tender, non-distended, with normoactive bowel sounds.  Ext: Warm, flaccid extremities Skin: No new rashes, lesions or ulcers on visualized skin. Neuro: Alert and oriented. peripheral ataxia, weakness with no new focal neurological deficits. Psych: Judgement and insight appear fair. Mood euthymic & affect congruent. Behavior is appropriate.     Data Reviewed: I have personally reviewed following labs and imaging studies  CBC: Recent Labs  Lab 06/20/21 1302 06/20/21 1329 06/20/21 1330 06/22/21 0320 06/23/21 0205  WBC 11.8*  --   --  11.0* 10.8*  NEUTROABS 7.9*  --   --  7.8*  --   HGB 11.8* 11.9* 12.2 10.6* 10.9*  HCT 37.9 35.0* 36.0 33.6* 34.5*  MCV 98.4  --   --  97.7 96.6  PLT 324  --   --  305 242   Basic Metabolic Panel: Recent Labs  Lab 06/20/21 1302 06/20/21 1329 06/23/21 0205  06/24/21 0308 06/25/21 0203 06/25/21 0802 06/26/21 0151  NA 141   < > 135 137 137 139 136  K 3.6   < > 3.8 4.0 3.9 4.0 3.9  CL 94*   < > 87* 91* 93* 93* 93*  CO2 34*   < > 36* 33* 36* 36* 35*  GLUCOSE 127*   < > 95 83 106* 93 99  BUN 33*   < > 41* 50* 55* 55* 63*  CREATININE 1.05*   < > 1.01* 1.11* 1.79* 1.70* 1.98*  CALCIUM 9.8   < > 8.9 9.3 8.7* 8.8* 8.6*  MG 1.9  --   --   --   --   --   --    < > = values in this interval not displayed.   GFR: Estimated Creatinine Clearance: 23.6 mL/min (A) (by C-G formula based on SCr of 1.98 mg/dL (H)). Liver Function Tests: Recent Labs  Lab 06/20/21 1302  AST 18  ALT 14  ALKPHOS 69  BILITOT 0.5  PROT 6.6  ALBUMIN 3.3*   No results for input(s): LIPASE, AMYLASE in the last 168 hours. No results for input(s): AMMONIA in the last 168 hours. Coagulation Profile: No results for input(s): INR, PROTIME in the last 168 hours.  Cardiac Enzymes: No results for input(s): CKTOTAL, CKMB, CKMBINDEX, TROPONINI in the last 168 hours. BNP (last 3 results) No results for input(s): PROBNP in the last 8760 hours. HbA1C: No results for input(s): HGBA1C in the last 72 hours. CBG: No results for input(s): GLUCAP in the last 168 hours. Lipid Profile: No results for input(s): CHOL, HDL, LDLCALC, TRIG, CHOLHDL, LDLDIRECT in the last 72 hours. Thyroid Function Tests: No results for input(s): TSH, T4TOTAL, FREET4, T3FREE, THYROIDAB in the last 72 hours.  Anemia Panel: No results for input(s): VITAMINB12, FOLATE, FERRITIN, TIBC, IRON, RETICCTPCT in the last 72 hours. Urine analysis:    Component Value Date/Time   COLORURINE YELLOW 06/20/2021 1303   APPEARANCEUR CLOUDY (A) 06/20/2021 1303   LABSPEC 1.012 06/20/2021 1303   PHURINE 6.0 06/20/2021 1303   GLUCOSEU NEGATIVE 06/20/2021 1303   HGBUR SMALL (A) 06/20/2021 1303   BILIRUBINUR NEGATIVE 06/20/2021 1303   KETONESUR NEGATIVE 06/20/2021 1303   PROTEINUR 100 (A) 06/20/2021 1303   UROBILINOGEN 0.2  10/29/2014 0816   NITRITE POSITIVE (A) 06/20/2021 1303   LEUKOCYTESUR TRACE (A) 06/20/2021 1303   No results found for this or any previous visit (from the past 240 hour(s)).    Radiology Studies: DG CHEST PORT 1 VIEW  Result Date: 06/25/2021 CLINICAL DATA:  Acute respiratory failure with hypoxia. EXAM: PORTABLE CHEST 1 VIEW COMPARISON:  06/20/2021 FINDINGS: Chronic atelectasis or scarring noted at the bases with persistent left base collapse/consolidative opacity and small left effusion. The cardio pericardial silhouette is enlarged. Bones are diffusely demineralized. Telemetry leads overlie the chest. IMPRESSION: Chronic atelectasis or scarring at the bases with persistent left base collapse/consolidative opacity and small left effusion. Electronically Signed   By: Misty Stanley M.D.   On: 06/25/2021 06:26    Scheduled Meds:  atorvastatin  40 mg Oral QHS   busPIRone  7.5 mg Oral TID   cloNIDine  0.2 mg Oral BID   enoxaparin (LOVENOX) injection  30 mg Subcutaneous Q24H   ferrous sulfate  325 mg Oral Q breakfast   gabapentin  600 mg Oral TID   loratadine  10 mg Oral Daily   magic mouthwash  10 mL Oral BID   mirtazapine  7.5 mg Oral QHS   Muscle Rub   Topical QHS   PARoxetine  37.5 mg Oral QHS   polyethylene glycol  17 g Oral Daily   senna  1 tablet Oral BID   Continuous Infusions:   LOS: 6 days   Time spent: 25 minutes.  Patrecia Pour, MD [facility] [email or web address] 06/26/2021, 4:29 PM

## 2021-06-27 DIAGNOSIS — Z66 Do not resuscitate: Secondary | ICD-10-CM | POA: Diagnosis not present

## 2021-06-27 DIAGNOSIS — J984 Other disorders of lung: Secondary | ICD-10-CM

## 2021-06-27 DIAGNOSIS — G118 Other hereditary ataxias: Secondary | ICD-10-CM

## 2021-06-27 DIAGNOSIS — J9621 Acute and chronic respiratory failure with hypoxia: Secondary | ICD-10-CM

## 2021-06-27 LAB — CBC
HCT: 34.6 % — ABNORMAL LOW (ref 36.0–46.0)
Hemoglobin: 10.5 g/dL — ABNORMAL LOW (ref 12.0–15.0)
MCH: 30 pg (ref 26.0–34.0)
MCHC: 30.3 g/dL (ref 30.0–36.0)
MCV: 98.9 fL (ref 80.0–100.0)
Platelets: 287 10*3/uL (ref 150–400)
RBC: 3.5 MIL/uL — ABNORMAL LOW (ref 3.87–5.11)
RDW: 14 % (ref 11.5–15.5)
WBC: 9.5 10*3/uL (ref 4.0–10.5)
nRBC: 0 % (ref 0.0–0.2)

## 2021-06-27 LAB — BASIC METABOLIC PANEL
Anion gap: 7 (ref 5–15)
BUN: 50 mg/dL — ABNORMAL HIGH (ref 8–23)
CO2: 34 mmol/L — ABNORMAL HIGH (ref 22–32)
Calcium: 9.1 mg/dL (ref 8.9–10.3)
Chloride: 100 mmol/L (ref 98–111)
Creatinine, Ser: 1.13 mg/dL — ABNORMAL HIGH (ref 0.44–1.00)
GFR, Estimated: 50 mL/min — ABNORMAL LOW (ref >60)
Glucose, Bld: 99 mg/dL (ref 70–99)
Potassium: 4 mmol/L (ref 3.5–5.1)
Sodium: 141 mmol/L (ref 135–145)

## 2021-06-27 NOTE — Progress Notes (Signed)
RT NOTES: NIF: -20, VC: 1.04L. Pt with good effort.

## 2021-06-27 NOTE — Progress Notes (Signed)
PROGRESS NOTE  Wendy Erickson  WLS:[id number] DOB: 1941/05/17 DOA: 06/20/2021 PCP: Merryl Hacker, No  Outpatient Specialists: Neurology, Dr. Jannifer Franklin; Endocrinology, Dr. Loanne Drilling; Pulmonology, Dr. Annamaria Boots. Brief Narrative: Wendy Erickson is a 80 y.o. female with a history of spinocerebellar ataxia, HTN, HLD, left RCC s/p nephrectomy, chronic 3L O2-dependence who presented to the ED from facility due to dyspnea and cough. she was noted to require 6L O2 to maintain oxygenation, and appeared lethargic which was improved with narcan. D-dimer equivocal and subsequent CTA chest showed no PE. There was evidence of mild-moderate pulmonary edema, small-moderate pleural effusions. Echocardiogram revealed evidence of elevated RA pressure, LVEF preserved, LVH with diastolic function indeterminate. Lasix and antibiotics given. SLP evaluation requested, felt patient at mild aspiration risk. With continued diuresis, AKI developed, now giving gentle IVF, though hypoxia remains worse than baseline. PCCM consulted for further recommendations.  Assessment & Plan: Principal Problem:   Acute on chronic respiratory failure with hypoxia (HCC) Active Problems:   HTN (hypertension)   Dysphagia, pharyngoesophageal phase   Normocytic anemia   CAP (community acquired pneumonia)   Leukocytosis   Anxiety   DNR (do not resuscitate)  Acute on chronic hypoxic respiratory failure: BNP, troponin, PCT all reassuring, though WBC elevated and CT suggestive of pulmonary edema. 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  - SLP evaluation performed, no follow up needed. Pt requires assistance feeding but is at mild aspiration risk.  - Remains on 6L O2, will plan to wean back to 3L O2 as tolerated. ?if this represents progression of pulmonary impairment from SCA? Encouraging IS. Appreciate pulmonary evaluation > check  NIF, VC, suspect restrictive FVC.   Acute HFpEF: Troponin wnl and had nonischemic myoview 2015.  - Repeat echo revealed preserved LVEF, asymmetric LVH, indeterminate diastolic function, dilated and blunted IVC. Interstitial edema with pleural effusions on CT chest consistent with pulmonary edema.  - Holding further diuresis for now. - Monitor I/O, weights.   Possible pneumonia:  - Completed 5 days of empiric abx.  AKI, solitary kidney s/p nephrectomy:   - Holding lisinopril - Monitor again in AM with gentle rehydration. CrCl not at baseline.  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 Jannifer Franklin until 2020 when prn follow up was recommended. - Continue gabapentin.   Anxiety/depression:  - Continue buspar, paroxetine, remeron  HLD:  - Continue statin  Iron deficiency anemia:  - Continue ferrous sulfate, bowel regimen.   HTN:  - Continue lisinopril, clonidine, prn hydralazine   Trigeminal neuralgia:  - Continue gabapentin. Stopped carbamazepine previously due to anemia  RML pulmonary nodule: 82mm - Formal recommendation would be to repeat CT in 6-12 months from scan on 06/20/2021. However, goal of care is primarily comfort and this would not be necessary with that in mind.  DVT prophylaxis: Lovenox 40mg  Code Status: DNR Family Communication: None at bedside Disposition Plan:  Status is: Inpatient  Remains inpatient appropriate because: Persistently hypoxemic not at baseline with progressive AKI.  Consultants:  None  Procedures:  Echocardiogram 06/21/2021:   1. Left ventricular ejection fraction, by estimation, is 60 to 65%. The  left ventricle has normal function. The left ventricle has no regional  wall motion abnormalities. There is moderate asymmetric left ventricular  hypertrophy of the basal-septal  segment. Left ventricular diastolic parameters are indeterminate.   2. Right ventricular systolic function is normal. The right ventricular   size is mildly enlarged.  Tricuspid regurgitation signal is inadequate for  assessing PA pressure.   3. A small pericardial effusion is present. The pericardial effusion is  circumferential.   4. The mitral valve is normal in structure. Mild mitral valve  regurgitation. No evidence of mitral stenosis.   5. The aortic valve is normal in structure. Aortic valve regurgitation is  mild. Aortic valve sclerosis is present, with no evidence of aortic valve  stenosis.   6. The inferior vena cava is dilated in size with <50% respiratory  variability, suggesting right atrial pressure of 15 mmHg.  Antimicrobials: Ceftriaxone, azithromycin   Subjective: No major changes in dyspnea. No chest pain or other complaints.   Objective: Vitals:   06/26/21 1930 06/27/21 0500 06/27/21 0732 06/27/21 0821  BP: (!) 163/64  102/63 132/75  Pulse: 72   (!) 54  Resp: 14  15 17   Temp: 98.9 F (37.2 C)  98.9 F (37.2 C) 98.7 F (37.1 C)  TempSrc: Axillary  Axillary Axillary  SpO2: 97%  100% 100%  Weight:  73.2 kg    Height:        Intake/Output Summary (Last 24 hours) at 06/27/2021 1419 Last data filed at 06/27/2021 1300 Gross per 24 hour  Intake 934.79 ml  Output 850 ml  Net 84.79 ml   Filed Weights   06/25/21 0528 06/26/21 0441 06/27/21 0500  Weight: 71.4 kg 73.9 kg 73.2 kg   Gen: 80 y.o. female in no distress Pulm: Nonlabored breathing at rest, diminished throughout. CV: Regular rate and rhythm. No murmur, rub, or gallop. No JVD, trace dependent edema. GI: Abdomen soft, non-tender, non-distended, with normoactive bowel sounds.  Ext: Warm, no new deformities Skin: No new rashes, lesions or ulcers on visualized skin. Neuro: Alert and oriented. Dysarthria and extremity weakness is stable. No new focal neurological deficits. Psych: Judgement and insight appear fair. Mood euthymic & affect congruent. Behavior is appropriate.    Data Reviewed: I have personally reviewed following labs and  imaging studies  CBC: Recent Labs  Lab 06/22/21 0320 06/23/21 0205 06/27/21 0204  WBC 11.0* 10.8* 9.5  NEUTROABS 7.8*  --   --   HGB 10.6* 10.9* 10.5*  HCT 33.6* 34.5* 34.6*  MCV 97.7 96.6 98.9  PLT 305 302 161   Basic Metabolic Panel: Recent Labs  Lab 06/24/21 0308 06/25/21 0203 06/25/21 0802 06/26/21 0151 06/27/21 0204  NA 137 137 139 136 141  K 4.0 3.9 4.0 3.9 4.0  CL 91* 93* 93* 93* 100  CO2 33* 36* 36* 35* 34*  GLUCOSE 83 106* 93 99 99  BUN 50* 55* 55* 63* 50*  CREATININE 1.11* 1.79* 1.70* 1.98* 1.13*  CALCIUM 9.3 8.7* 8.8* 8.6* 9.1   GFR: Estimated Creatinine Clearance: 41.1 mL/min (A) (by C-G formula based on SCr of 1.13 mg/dL (H)).  Urine analysis:    Component Value Date/Time   COLORURINE YELLOW 06/20/2021 1303   APPEARANCEUR CLOUDY (A) 06/20/2021 1303   LABSPEC 1.012 06/20/2021 1303   PHURINE 6.0 06/20/2021 1303   GLUCOSEU NEGATIVE 06/20/2021 1303   HGBUR SMALL (A) 06/20/2021 1303   BILIRUBINUR NEGATIVE 06/20/2021 1303   KETONESUR NEGATIVE 06/20/2021 1303   PROTEINUR 100 (A) 06/20/2021 1303   UROBILINOGEN 0.2 10/29/2014 0816   NITRITE POSITIVE (A) 06/20/2021 1303   LEUKOCYTESUR TRACE (A) 06/20/2021 1303     Scheduled Meds:  atorvastatin  40 mg Oral QHS   busPIRone  7.5 mg Oral TID   cloNIDine  0.2 mg Oral BID   enoxaparin (  LOVENOX) injection  30 mg Subcutaneous Q24H   ferrous sulfate  325 mg Oral Q breakfast   gabapentin  600 mg Oral TID   loratadine  10 mg Oral Daily   magic mouthwash  10 mL Oral BID   mirtazapine  7.5 mg Oral QHS   Muscle Rub   Topical QHS   PARoxetine  37.5 mg Oral QHS   polyethylene glycol  17 g Oral Daily   senna  1 tablet Oral BID   Continuous Infusions:  lactated ringers 50 mL/hr at 06/27/21 1300    LOS: 7 days   Time spent: 25 minutes.  Patrecia Pour, MD [facility] [email or web address] 06/27/2021, 2:19 PM

## 2021-06-27 NOTE — Consult Note (Addendum)
NAME:  Wendy Erickson, MRN:  [id number], DOB:  21-May-1941, LOS: 7 ADMISSION DATE:  06/20/2021, CONSULTATION DATE:  06/27/21 REFERRING MD:  Patrecia Pour, MD, CHIEF COMPLAINT:  respiratory failure   History of Present Illness:  Wendy Erickson is a 80 y.o. 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 She lives in a nursing home. She presented with worsening dyspnea and cough. She was lethargic and responded to narcan. She has been diuresed with IV lasix. Her baseline oxygen requirement is 3LNC. She is currently on [facility]. She has been diuresed about 2.2L net negative since admit. She has had SLP evalution and is not aspirating. She has completed 5 days of antibtioics empirically for pneumonia.  Due to her SCA, she has significant weakness and debility. She is bed bound and hasn't walked since 2016. Her son is at bedside. His father (her husband) also had SCA. multiple family members have SCA and there is a possibility he and his children could develop it as well.  PCCM consulted to assist with work up of worsening hypoxemia.      Pertinent  Medical History  Spinocerebella ataxia  Significant Hospital Events: Including procedures, antibiotic start and stop dates in addition to other pertinent events   11/13 >admitted for dyspnea, cough lethargy, improved with narcan initially  Interim History / Subjective:    Objective   Blood pressure 132/75, pulse (!) 54, temperature 98.7 F (37.1 C), temperature source Axillary, resp. rate 17, height 5\' 5"  (1.651 m), weight 73.2 kg, SpO2 100 %.        Intake/Output Summary (Last 24 hours) at 06/27/2021 1403 Last data filed at 06/27/2021 1300 Gross per 24 hour  Intake 934.79 ml  Output 1750 ml  Net -815.21 ml   Filed Weights   06/25/21 0528 06/26/21 0441 06/27/21 0500  Weight: 71.4 kg 73.9 kg 73.2 kg    Examination: General: chronically ill appearing, no respiratory  distress HENT: on nasal cannula Lungs: diminished bilateral lung bases Cardiovascular: RRR, no mrg Abdomen: soft, nt, nd Extremities: thin, no edema, decreased muscle bulk and tone of lower extremities Neuro: dysarthria, cannot move lower extremities, moves upper extremities   Echocardiogram Jun 21 2021 shows  LVEF preserved, RV size mildly enlarged, small pericardial effusion, no valvular disease  CTPE study 11/13 shows pulmonary edema, bilateral pleural effusions, no PE.   Labs troponin not elevated, Cr 1.13  Procalcitonin not elevated BNP not elevated on admission   Assessment & Plan:  Wendy Erickson is a 80 y.o. 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 She had PFTs done in 2020 which showed mild restriction to ventilation FEV1 73% of predicted based on N2 washout study. I suspect this has worsened over 2 years. Will obtain NIF/VC while inpatient. We talked about the progressive nature of motor neuron disease as well as concern for diaphragmatic involvement. We discussed follow up in the office and PFTs. She would not want bipap therapy for hypercapnia.  Talked with patient's son at length.  He notes that his mom's focus is on her comfort. She is hesitant to follow up in the office. We discussed that part of her hypoxemia is due to poor wave form plethysmography. Can  try a forehead or ear probe instead. I have turned her down form 6LNC to [facility] and she is still 99%.   For her SPN I would recommend a CT Scan in 3-6 months. But this is very low priority and is not in line for her goals of comfort. If this was cancer she would not get any treatment for it. Therefore no further follow up needed.   I did go over code status with  them. She is a DNR. Discussed with son that if breathing gets worse at nursing home, I would focus on comfort measures and advised him to look at a nursing home with hospice services available.   PCCM will see as needed.  Thank you for the consult, please call with questions.   Lenice Llamas, MD Pulmonary and [place]   Labs   CBC: Recent Labs  Lab 06/22/21 0320 06/23/21 0205 06/27/21 0204  WBC 11.0* 10.8* 9.5  NEUTROABS 7.8*  --   --   HGB 10.6* 10.9* 10.5*  HCT 33.6* 34.5* 34.6*  MCV 97.7 96.6 98.9  PLT 305 302 656    Basic Metabolic Panel: Recent Labs  Lab 06/24/21 0308 06/25/21 0203 06/25/21 0802 06/26/21 0151 06/27/21 0204  NA 137 137 139 136 141  K 4.0 3.9 4.0 3.9 4.0  CL 91* 93* 93* 93* 100  CO2 33* 36* 36* 35* 34*  GLUCOSE 83 106* 93 99 99  BUN 50* 55* 55* 63* 50*  CREATININE 1.11* 1.79* 1.70* 1.98* 1.13*  CALCIUM 9.3 8.7* 8.8* 8.6* 9.1   GFR: Estimated Creatinine Clearance: 41.1 mL/min (A) (by C-G formula based on SCr of 1.13 mg/dL (H)). Recent Labs  Lab 06/20/21 1503 06/22/21 0320 06/23/21 0205 06/27/21 0204  PROCALCITON 0.12  --   --   --   WBC  --  11.0* 10.8* 9.5    Liver Function Tests: No results for input(s): AST, ALT, ALKPHOS, BILITOT, PROT, ALBUMIN in the last 168 hours.  No results for input(s): LIPASE, AMYLASE in the last 168 hours. No results for input(s): AMMONIA in the last 168 hours.  ABG    Component Value Date/Time   HCO3 41.8 (H) 06/20/2021 1329   TCO2 38 (H) 06/20/2021 1330   O2SAT 99.0 06/20/2021 1329     Coagulation Profile: No results for input(s): INR, PROTIME in the last 168 hours.  Cardiac Enzymes: No results for input(s): CKTOTAL, CKMB, CKMBINDEX, TROPONINI in the last 168 hours.  HbA1C: No results found for: HGBA1C  CBG: No results for input(s): GLUCAP in the last 168 hours.  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   Surgical History:   Past Surgical History:  Procedure Laterality Date   BACK SURGERY     BIOPSY  07/08/2020   Procedure: BIOPSY;  Surgeon: Clarene Essex, MD;  Location: [place];  Service: Endoscopy;;   CATARACT EXTRACTION W/ INTRAOCULAR LENS IMPLANT Left    DILATION AND  CURETTAGE OF UTERUS     ESOPHAGOGASTRODUODENOSCOPY N/A 07/08/2020   Procedure: ESOPHAGOGASTRODUODENOSCOPY (EGD);  Surgeon: Clarene Essex, MD;  Location: [place];  Service: Endoscopy;  Laterality: N/A;   FEMUR IM NAIL Left 06/30/2020   Procedure: INTRAMEDULLARY (IM) NAIL FEMORAL;  Surgeon: Nicholes Stairs, MD;  Location: [place];  Service: Orthopedics;  Laterality: Left;   KNEE ARTHROSCOPY Bilateral    LAPAROSCOPIC NEPHRECTOMY  10/06/2011   Procedure: LAPAROSCOPIC NEPHRECTOMY;  Surgeon: Dutch Gray, MD;  Location: [facility];  Service: Urology;  Laterality: Left;  LEFT LAPAROSCOPIC RADICAL NEPHRECTOMY    LUMBAR [place] SURGERY     "herniated disc"   TONSILLECTOMY     VAGINAL HYSTERECTOMY       Social History:   reports that she quit smoking about 15 years ago. Her smoking use included cigarettes. She has a 40.00 pack-year smoking history. She has never used smokeless tobacco. She reports that she does not drink alcohol and does not use drugs.   Family History:  Her family  history includes Ataxia in her father; Dementia in her mother.   Allergies Allergies  Allergen Reactions   Pineapple Other (See Comments)    unknown     Home Medications  Prior to Admission medications   Medication Sig Start Date End Date Taking? Authorizing Provider  acetaminophen (TYLENOL) 325 MG tablet Take 650 mg by mouth every 6 (six) hours as needed for moderate pain or fever (max 10 in 24 hours).    Yes [provider]  alum & mag hydroxide-simeth (MAALOX/MYLANTA) 200-200-20 MG/5 SUSP Apply 1 application topically every 6 (six) hours as needed (indigestion).   Yes [provider]  amLODipine (NORVASC) 10 MG tablet Take 10 mg by mouth at bedtime.   Yes [provider]  atorvastatin (LIPITOR) 40 MG tablet Take 40 mg by mouth at bedtime.    Yes [provider]  busPIRone (BUSPAR) 7.5 MG tablet Take 7.5 mg by mouth 3 (three) times daily.   Yes [provider]  Calcium Carbonate-Vitamin D 600-400 MG-UNIT tablet Take 1 tablet by mouth daily.    Yes [provider]  carboxymethylcellulose 1 % ophthalmic solution Apply 1 drop to eye 3 (three) times daily as needed (dry eyes).   Yes [provider]  cetirizine (ZYRTEC) 5 MG tablet Take 5 mg by mouth daily.   Yes [provider]  Cholecalciferol (VITAMIN D3) 1.25 MG (50000 UT) CAPS Take 1.25 mg by mouth every 30 (thirty) days.   Yes [provider]  cloNIDine (CATAPRES) 0.2 MG tablet Take 0.2 mg by mouth 2 (two) times daily.   Yes [provider]  CRANBERRY-VITAMIN C PO Take 1 tablet by mouth at bedtime.   Yes [provider]  Dextromethorphan-Benzocaine 5-7.5 MG LOZG Use as directed 1 lozenge in the mouth or throat every 2 (two) hours as needed (cough, allergies).   Yes [provider]  ferrous sulfate 325 (65 FE) MG EC tablet Take 1 tablet (325 mg total) by mouth 2 (two) times daily. Patient taking differently: Take 325 mg by mouth  daily. 06/22/18 06/20/21 Yes Elgergawy, Silver Huguenin, MD  furosemide (LASIX) 40 MG tablet Take 40 mg by mouth daily.   Yes [provider]  gabapentin (NEURONTIN) 600 MG tablet Take 600 mg by mouth 3 (three) times daily.   Yes [provider]  hydrALAZINE (APRESOLINE) 50 MG tablet Take 50 mg by mouth every 6 (six) hours as needed (SBP > 170).   Yes [provider]  lisinopril (ZESTRIL) 20 MG tablet Take 20 mg by mouth daily.   Yes [provider]  magic mouthwash SOLN Take 10 mLs by mouth in the morning and at bedtime.   Yes [provider]  magnesium hydroxide (MILK OF MAGNESIA) 400 MG/5ML suspension Take 30 mLs by mouth daily as needed for mild constipation.   Yes [provider]  Menthol, Topical Analgesic, (BIOFREEZE) 4 % GEL Apply 1 application topically at bedtime.   Yes [provider]  mirtazapine (REMERON) 7.5 MG tablet Take 7.5 mg by mouth at bedtime.   Yes [provider]  oxyCODONE-acetaminophen (PERCOCET/ROXICET) 5-325 MG tablet Take 1 tablet by mouth every 6 (six) hours as needed for severe pain.   Yes [provider]  PARoxetine (PAXIL-CR) 37.5 MG 24 hr tablet Take 37.5 mg by mouth at bedtime.   Yes [provider]  polyethylene glycol (MIRALAX) packet Take 17 g by mouth daily as needed for moderate constipation. Patient taking differently: Take 17 g by mouth daily. 05/14/14  Yes Domenic Polite, MD  potassium chloride (KLOR-CON) 10 MEQ tablet Take 10 mEq by mouth daily.   Yes [provider]  senna (SENOKOT) 8.6 MG tablet Take 1-2 tablets by mouth See admin instructions. Take 1 tablet in the morning and 2 tablets at night   Yes [provider]  amLODipine (NORVASC) 5 MG tablet Take 1 tablet (5 mg total) by mouth daily. Patient not taking: Reported on 06/20/2021 07/10/20 08/09/20  Marianna Payment, MD  gabapentin (NEURONTIN) 300 MG capsule Take 1 capsule (300 mg total) by mouth 3 (three)  times daily. Patient not taking: No sig reported 07/03/18   Kathrynn Ducking, MD  HYDROcodone-acetaminophen (NORCO/VICODIN) 5-325 MG tablet Take 2 tablets by mouth every 6 (six) hours as needed for moderate pain. Patient not taking: Reported on 06/20/2021 06/30/20 06/30/21  Nicholes Stairs, MD  lisinopril (ZESTRIL) 5 MG tablet Take 1 tablet (5 mg total) by mouth daily. Patient not taking: Reported on 06/20/2021 07/10/20 08/09/20  Marianna Payment, MD  pantoprazole (PROTONIX) 40 MG tablet Take 1 tablet (40 mg total) by mouth 2 (two) times daily for 30 days, THEN 1 tablet (40 mg total) daily. Patient not taking: Reported on 06/20/2021 07/10/20 11/07/20  Marianna Payment, MD  rivaroxaban (XARELTO) 10 MG TABS tablet Take 1 tablet (10 mg total) by mouth daily for 10 days. Patient not taking: Reported on 06/20/2021 07/11/20 07/21/20  Marianna Payment, MD

## 2021-06-28 LAB — RESP PANEL BY RT-PCR (FLU A&B, COVID) ARPGX2
Influenza A by PCR: NEGATIVE
Influenza B by PCR: NEGATIVE
SARS Coronavirus 2 by RT PCR: NEGATIVE

## 2021-06-28 LAB — BASIC METABOLIC PANEL
Anion gap: 5 (ref 5–15)
BUN: 35 mg/dL — ABNORMAL HIGH (ref 8–23)
CO2: 35 mmol/L — ABNORMAL HIGH (ref 22–32)
Calcium: 8.9 mg/dL (ref 8.9–10.3)
Chloride: 99 mmol/L (ref 98–111)
Creatinine, Ser: 0.89 mg/dL (ref 0.44–1.00)
GFR, Estimated: >60 mL/min (ref >60)
Glucose, Bld: 104 mg/dL — ABNORMAL HIGH (ref 70–99)
Potassium: 4.4 mmol/L (ref 3.5–5.1)
Sodium: 139 mmol/L (ref 135–145)

## 2021-06-28 NOTE — Progress Notes (Signed)
Report given to [place] at [facility] at 1356. All questions and concerns addressed. Awaiting on PTAR.

## 2021-06-28 NOTE — Care Management Important Message (Signed)
Important Message  Patient Details  Name: Takera Rayl MRN: [id number] Date of Birth: [birth year]   Medicare Important Message Given:  Yes     Shelda Altes 06/28/2021, 10:14 AM

## 2021-06-28 NOTE — Discharge Summary (Signed)
Physician Discharge Summary  Wendy Erickson KNL:[id number] DOB: June 24, 1941 DOA: 06/20/2021  PCP: Merryl Hacker, No  Admit date: 06/20/2021 Discharge date: 06/28/2021  Admitted From: [facility] Disposition: [place]  Recommendations for Outpatient Follow-up:  Follow up with PCP at facility. Recommend palliative care to follow longitudinally as patient's goals of care with progressive disease from spinocerebellar atrophy are comfort-focused.   Home Health: None new Equipment/Devices: 4L O2 Discharge Condition: Stable CODE STATUS: DNR Diet recommendation: Heart healthy  Brief/Interim Summary: Wendy Erickson is a 80 y.o. female with a history of spinocerebellar ataxia, HTN, HLD, left RCC s/p nephrectomy, chronic 3L O2-dependence who presented to the ED from facility due to dyspnea and cough. she was noted to require 6L O2 to maintain oxygenation, and appeared lethargic which was improved with narcan. D-dimer equivocal and subsequent CTA chest showed no PE. There was evidence of mild-moderate pulmonary edema, small-moderate pleural effusions. Echocardiogram revealed evidence of elevated RA pressure, LVEF preserved, LVH with diastolic function indeterminate. Lasix and antibiotics given. SLP evaluation requested, felt patient at mild aspiration risk. With continued diuresis, AKI developed. This has improved with gentle IV fluids. 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 Pulmonary follow up was offered but the patient and her son have made clear that they prefer to return to long term care facility and have care focused on comfort rather than continued aggressive interventions.   Discharge Diagnoses:  Principal Problem:   Acute on chronic respiratory failure with hypoxia (HCC) Active Problems:   HTN (hypertension)   Dysphagia, pharyngoesophageal phase   Normocytic anemia   CAP (community acquired pneumonia)   Leukocytosis   Anxiety    DNR (do not resuscitate)  Acute on chronic hypoxic respiratory failure: BNP, troponin, PCT all reassuring, though WBC elevated and CT suggestive of pulmonary edema. 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  - SLP evaluation performed, no follow up needed. Pt requires assistance feeding but is at mild aspiration risk.  - Pulmonary consulted and suspects this is primarily due to progressive neuromuscular weakness, hypoventilation with diminished FVC. Has stabilized on 4L O2 without distress or hypoxemia on this. We will continue this at discharge. Also needs aggressive pulmonary hygiene. Pulmonary follow up offered though pt declines. Recommend palliative care engagement.    Acute HFpEF: Troponin wnl and had nonischemic myoview 2015.  - Repeat echo revealed preserved LVEF, asymmetric LVH, indeterminate diastolic function, dilated and blunted IVC. Interstitial edema with pleural effusions on CT chest consistent with pulmonary edema.  - Holding further diuresis for now. - Monitor I/O, weights.    Possible pneumonia:  - Completed 5 days of empiric abx.   AKI on stage IIIa CKD, solitary kidney s/p nephrectomy:   - CrCl improved with gentle hydration after attempt at diuresis. Ok to continue home medications.    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 Jannifer Franklin until 2020 when prn follow up was recommended. - Continue gabapentin (at max dose for renal clearance).    Anxiety/depression:  - Continue buspar, paroxetine, remeron   HLD:  - Continue statin   Iron deficiency anemia:  - Continue ferrous sulfate, bowel regimen.    HTN:  - Continue lisinopril, clonidine, prn hydralazine   Trigeminal neuralgia: Improved. Has not taken any prn pain medications while admitted. - Continue gabapentin. Stopped carbamazepine previously due to  anemia   RML pulmonary nodule: 32mm -  Formal recommendation would be to repeat CT in 6-12 months from scan on 06/20/2021. However, goal of care is primarily comfort and this would not be necessary with that in mind.  Discharge Instructions Discharge Instructions     Diet - low sodium heart healthy   Complete by: As directed       Allergies as of 06/28/2021       Reactions   Pineapple Other (See Comments)   unknown        Medication List     STOP taking these medications    HYDROcodone-acetaminophen 5-325 MG tablet Commonly known as: NORCO/VICODIN   oxyCODONE-acetaminophen 5-325 MG tablet Commonly known as: PERCOCET/ROXICET   pantoprazole 40 MG tablet Commonly known as: Protonix   rivaroxaban 10 MG Tabs tablet Commonly known as: XARELTO       TAKE these medications    acetaminophen 325 MG tablet Commonly known as: TYLENOL Take 650 mg by mouth every 6 (six) hours as needed for moderate pain or fever (max 10 in 24 hours).   alum & mag hydroxide-simeth 200-200-20 MG/5 Susp Commonly known as: MAALOX/MYLANTA Apply 1 application topically every 6 (six) hours as needed (indigestion).   amLODipine 10 MG tablet Commonly known as: NORVASC Take 10 mg by mouth at bedtime. What changed: Another medication with the same name was removed. Continue taking this medication, and follow the directions you see here.   atorvastatin 40 MG tablet Commonly known as: LIPITOR Take 40 mg by mouth at bedtime.   Biofreeze 4 % Gel Generic drug: Menthol (Topical Analgesic) Apply 1 application topically at bedtime.   busPIRone 7.5 MG tablet Commonly known as: BUSPAR Take 7.5 mg by mouth 3 (three) times daily.   Calcium Carbonate-Vitamin D 600-400 MG-UNIT tablet Take 1 tablet by mouth daily.   carboxymethylcellulose 1 % ophthalmic solution Apply 1 drop to eye 3 (three) times daily as needed (dry eyes).   cetirizine 5 MG tablet Commonly known as: ZYRTEC Take 5 mg by mouth  daily.   cloNIDine 0.2 MG tablet Commonly known as: CATAPRES Take 0.2 mg by mouth 2 (two) times daily.   CRANBERRY-VITAMIN C PO Take 1 tablet by mouth at bedtime.   Dextromethorphan-Benzocaine 5-7.5 MG Lozg Use as directed 1 lozenge in the mouth or throat every 2 (two) hours as needed (cough, allergies).   ferrous sulfate 325 (65 FE) MG EC tablet Take 1 tablet (325 mg total) by mouth 2 (two) times daily. What changed: when to take this   furosemide 40 MG tablet Commonly known as: LASIX Take 40 mg by mouth daily.   gabapentin 600 MG tablet Commonly known as: NEURONTIN Take 600 mg by mouth 3 (three) times daily. What changed: Another medication with the same name was removed. Continue taking this medication, and follow the directions you see here.   hydrALAZINE 50 MG tablet Commonly known as: APRESOLINE Take 50 mg by mouth every 6 (six) hours as needed (SBP > 170).   lisinopril 20 MG tablet Commonly known as: ZESTRIL Take 20 mg by mouth daily. What changed: Another medication with the same name was removed. Continue taking this medication, and follow the directions you see here.   magic mouthwash Soln Take 10 mLs by mouth in the morning and at bedtime.   magnesium hydroxide 400 MG/5ML suspension Commonly known as: MILK OF MAGNESIA Take 30 mLs by mouth daily as needed for mild constipation.   mirtazapine 7.5 MG tablet Commonly known as: REMERON Take 7.5 mg by mouth at  bedtime.   PARoxetine 37.5 MG 24 hr tablet Commonly known as: PAXIL-CR Take 37.5 mg by mouth at bedtime.   polyethylene glycol 17 g packet Commonly known as: MiraLax Take 17 g by mouth daily as needed for moderate constipation. What changed: when to take this   potassium chloride 10 MEQ tablet Commonly known as: KLOR-CON Take 10 mEq by mouth daily.   senna 8.6 MG tablet Commonly known as: SENOKOT Take 1-2 tablets by mouth See admin instructions. Take 1 tablet in the morning and 2 tablets at  night   Vitamin D3 1.25 MG (50000 UT) Caps Take 1.25 mg by mouth every 30 (thirty) days.        Allergies  Allergen Reactions   Pineapple Other (See Comments)    unknown    Consultations: PCCM, Dr. Lenice Llamas  Procedures/Studies: CT Angio Chest Pulmonary Embolism (PE) W or WO Contrast  Result Date: 06/20/2021 CLINICAL DATA:  PE suspected, low/intermediate prob, positive D-dimer EXAM: CT ANGIOGRAPHY CHEST WITH CONTRAST TECHNIQUE: Multidetector CT imaging of the chest was performed using the standard protocol during bolus administration of intravenous contrast. Multiplanar CT image reconstructions and MIPs were obtained to evaluate the vascular anatomy. CONTRAST:  180mL OMNIPAQUE IOHEXOL 350 MG/ML SOLN COMPARISON:  CT 05/21/2019 FINDINGS: Cardiovascular: Mildly enlarged heart. Trace pericardial thickening/effusion. Mildly enlarged branch pulmonary arteries. There is no evidence of pulmonary embolism. The thoracic aorta demonstrates moderate arch calcifications. No aneurysm or dissection is evident. Mediastinum/Nodes: No lymphadenopathy. The thyroid is unremarkable. Esophagus is unremarkable. Lungs/Pleura: The central airways are patent. There is diffuse mild bronchial wall thickening. There is interlobular septal thickening and ground-glass opacities. Small to moderate-sized bilateral pleural effusions with adjacent basilar atelectasis. There is a 6 mm ground-glass nodule in the right middle lobe (series 6, image 65). There is also bandlike opacities along the fissures which could be a combination of bandlike atelectasis and fluid in the fissures. Upper: Tiny hepatic liver densities which are likely cysts. No acute abnormality. Musculoskeletal: Unchanged chronic anterior compression deformity of T12. No acute abnormality. No suspicious lytic or blastic lesions. Review of the MIP images confirms the above findings. IMPRESSION: Mild to moderate pulmonary edema. Small to moderate-sized bilateral  pleural effusions with adjacent basilar atelectasis. Trace pericardial effusion. No evidence of pulmonary embolism. 6 mm ground-glass nodule in the right middle lobe. Initial follow-up with CT at 6-12 months is recommended to confirm persistence. If persistent, repeat CT is recommended every 2 years until 5 years of stability has been established. This recommendation follows the consensus statement: Guidelines for Management of Incidental Pulmonary Nodules Detected on CT Images: From the Fleischner Society 2017; Radiology 2017; 284:228-243. Aortic Atherosclerosis (ICD10-I70.0). Electronically Signed   By: Maurine Simmering M.D.   On: 06/20/2021 18:34   DG CHEST PORT 1 VIEW  Result Date: 06/25/2021 CLINICAL DATA:  Acute respiratory failure with hypoxia. EXAM: PORTABLE CHEST 1 VIEW COMPARISON:  06/20/2021 FINDINGS: Chronic atelectasis or scarring noted at the bases with persistent left base collapse/consolidative opacity and small left effusion. The cardio pericardial silhouette is enlarged. Bones are diffusely demineralized. Telemetry leads overlie the chest. IMPRESSION: Chronic atelectasis or scarring at the bases with persistent left base collapse/consolidative opacity and small left effusion. Electronically Signed   By: Misty Stanley M.D.   On: 06/25/2021 06:26   DG Chest Port 1 View  Result Date: 06/20/2021 CLINICAL DATA:  Shortness of breath. EXAM: PORTABLE CHEST 1 VIEW COMPARISON:  Chest x-ray dated June 30, 2020. FINDINGS: The heart size and mediastinal  contours are within normal limits. Low lung volumes. New small left pleural effusion with left basilar opacity. Mild right basilar atelectasis with unchanged linear scarring. No pneumothorax. No acute osseous abnormality. IMPRESSION: 1. New small left pleural effusion with left basilar atelectasis versus pneumonia. Electronically Signed   By: Titus Dubin M.D.   On: 06/20/2021 13:59   ECHOCARDIOGRAM COMPLETE  Result Date: 06/21/2021     ECHOCARDIOGRAM REPORT   Patient Name:   LAURAINE CRESPO Date of Exam: 06/21/2021 Medical Rec #:  [id number]     Height:       65.0 in Accession #:    [id number]    Weight:       164.0 lb Date of Birth:  12-Mar-1941     BSA:          1.818 m Patient Age:    8 years      BP:           150/61 mmHg Patient Gender: F             HR:           68 bpm. Exam Location:  Inpatient Procedure: 2D Echo, Cardiac Doppler, Color Doppler and Intracardiac            Opacification Agent Indications:    Respiratory distress  History:        Patient has no prior history of Echocardiogram examinations.                 Risk Factors:Hypertension.  Sonographer:    Jyl Heinz Referring Phys: [place]  1. Left ventricular ejection fraction, by estimation, is 60 to 65%. The left ventricle has normal function. The left ventricle has no regional wall motion abnormalities. There is moderate asymmetric left ventricular hypertrophy of the basal-septal segment. Left ventricular diastolic parameters are indeterminate.  2. Right ventricular systolic function is normal. The right ventricular size is mildly enlarged. Tricuspid regurgitation signal is inadequate for assessing PA pressure.  3. A small pericardial effusion is present. The pericardial effusion is circumferential.  4. The mitral valve is normal in structure. Mild mitral valve regurgitation. No evidence of mitral stenosis.  5. The aortic valve is normal in structure. Aortic valve regurgitation is mild. Aortic valve sclerosis is present, with no evidence of aortic valve stenosis.  6. The inferior vena cava is dilated in size with <50% respiratory variability, suggesting right atrial pressure of 15 mmHg. FINDINGS  Left Ventricle: Left ventricular ejection fraction, by estimation, is 60 to 65%. The left ventricle has normal function. The left ventricle has no regional wall motion abnormalities. The left ventricular internal cavity size was normal in size. There is  moderate  asymmetric left ventricular hypertrophy of the basal-septal segment. Left ventricular diastolic parameters are indeterminate. Right Ventricle: The right ventricular size is mildly enlarged. No increase in right ventricular wall thickness. Right ventricular systolic function is normal. Tricuspid regurgitation signal is inadequate for assessing PA pressure. Left Atrium: Left atrial size was normal in size. Right Atrium: Right atrial size was normal in size. Pericardium: A small pericardial effusion is present. The pericardial effusion is circumferential. Presence of epicardial fat layer. Mitral Valve: The mitral valve is normal in structure. Mild mitral valve regurgitation. No evidence of mitral valve stenosis. Tricuspid Valve: The tricuspid valve is normal in structure. Tricuspid valve regurgitation is trivial. No evidence of tricuspid stenosis. Aortic Valve: The aortic valve is normal in structure. Aortic valve regurgitation is mild. Aortic valve sclerosis  is present, with no evidence of aortic valve stenosis. Aortic valve peak gradient measures 15.1 mmHg. Pulmonic Valve: The pulmonic valve was normal in structure. Pulmonic valve regurgitation is not visualized. No evidence of pulmonic stenosis. Aorta: The aortic root is normal in size and structure. Venous: The inferior vena cava is dilated in size with less than 50% respiratory variability, suggesting right atrial pressure of 15 mmHg. IAS/Shunts: No atrial level shunt detected by color flow Doppler.  LEFT VENTRICLE PLAX 2D LVIDd:         4.10 cm      Diastology LVIDs:         2.40 cm      LV e' medial:    3.70 cm/s LV PW:         1.15 cm      LV E/e' medial:  18.2 LV IVS:        1.40 cm      LV e' lateral:   4.03 cm/s LVOT diam:     2.00 cm      LV E/e' lateral: 16.7 LV SV:         120 LV SV Index:   66 LVOT Area:     3.14 cm  LV Volumes (MOD) LV vol d, MOD A2C: 104.0 ml LV vol d, MOD A4C: 114.0 ml LV vol s, MOD A2C: 31.9 ml LV vol s, MOD A4C: 37.5 ml LV SV MOD  A2C:     72.1 ml LV SV MOD A4C:     114.0 ml LV SV MOD BP:      76.3 ml RIGHT VENTRICLE             IVC RV Basal diam:  4.30 cm     IVC diam: 2.20 cm RV S prime:     10.90 cm/s TAPSE (M-mode): 2.4 cm LEFT ATRIUM           Index        RIGHT ATRIUM           Index LA diam:      2.30 cm 1.26 cm/m   RA Area:     13.70 cm LA Vol (A2C): 29.7 ml 16.33 ml/m  RA Volume:   34.50 ml  18.97 ml/m LA Vol (A4C): 34.8 ml 19.14 ml/m  AORTIC VALVE AV Area (Vmax): 2.91 cm AV Vmax:        194.00 cm/s AV Peak Grad:   15.1 mmHg LVOT Vmax:      180.00 cm/s LVOT Vmean:     120.000 cm/s LVOT VTI:       0.383 m  AORTA Ao Root diam: 2.80 cm Ao Asc diam:  2.60 cm MITRAL VALVE MV Area (PHT): 3.60 cm     SHUNTS MV Decel Time: 211 msec     Systemic VTI:  0.38 m MV E velocity: 67.30 cm/s   Systemic Diam: 2.00 cm MV A velocity: 106.00 cm/s MV E/A ratio:  0.63 Kardie Tobb DO Electronically signed by Berniece Salines DO Signature Date/Time: 06/21/2021/11:52:29 AM    Final     Subjective: Breathing is about the same, overall improved from admission. No chest pain or other issues.   Discharge Exam: Vitals:   06/28/21 0835 06/28/21 0920  BP: (!) 166/66   Pulse: (!) 51   Resp: 18   Temp:  97.8 F (36.6 C)  SpO2: 100% 97%   General: Pt is alert, awake, not in acute distress Cardiovascular: RRR, S1/S2 +, no rubs, no gallops  Respiratory: Nonlabored, no crackles or wheezes, diminished excursions.  Abdominal: Soft, NT, ND, bowel sounds + Extremities: No pitting edema, no cyanosis. Stable incomplete quadriparesis.  Labs: BNP (last 3 results) Recent Labs    06/20/21 1302  BNP 57.8   Basic Metabolic Panel: Recent Labs  Lab 06/25/21 0203 06/25/21 0802 06/26/21 0151 06/27/21 0204 06/28/21 0117  NA 137 139 136 141 139  K 3.9 4.0 3.9 4.0 4.4  CL 93* 93* 93* 100 99  CO2 36* 36* 35* 34* 35*  GLUCOSE 106* 93 99 99 104*  BUN 55* 55* 63* 50* 35*  CREATININE 1.79* 1.70* 1.98* 1.13* 0.89  CALCIUM 8.7* 8.8* 8.6* 9.1 8.9    CBC: Recent Labs  Lab 06/22/21 0320 06/23/21 0205 06/27/21 0204  WBC 11.0* 10.8* 9.5  NEUTROABS 7.8*  --   --   HGB 10.6* 10.9* 10.5*  HCT 33.6* 34.5* 34.6*  MCV 97.7 96.6 98.9  PLT 305 302 287   Urinalysis    Component Value Date/Time   COLORURINE YELLOW 06/20/2021 1303   APPEARANCEUR CLOUDY (A) 06/20/2021 1303   LABSPEC 1.012 06/20/2021 1303   PHURINE 6.0 06/20/2021 1303   GLUCOSEU NEGATIVE 06/20/2021 1303   HGBUR SMALL (A) 06/20/2021 1303   BILIRUBINUR NEGATIVE 06/20/2021 1303   [place] 06/20/2021 1303   PROTEINUR 100 (A) 06/20/2021 1303   UROBILINOGEN 0.2 10/29/2014 0816   NITRITE POSITIVE (A) 06/20/2021 1303   LEUKOCYTESUR TRACE (A) 06/20/2021 1303   Time coordinating discharge: Approximately 40 minutes  Patrecia Pour, MD  [facility] 06/28/2021, 12:08 PM

## 2021-06-28 NOTE — Progress Notes (Signed)
[facility] here for transport. Pt's BPs are elevated and as per flow. Rechecked manually. MD aware, PRN Hydralazine administered as per [facility]. [facility] aware and Pt safe for discharge.

## 2021-06-28 NOTE — TOC Transition Note (Signed)
Transition of Care [facility]) - CM/SW Discharge Note   Patient Details  Name: Wendy Erickson MRN: [id number] Date of Birth: 1941/02/03  [facility]) CM/SW Contact:  Milas Gain, [place] Phone Number: 06/28/2021, 12:32 PM   Clinical Narrative:     Patient will DC to: [facility]   Anticipated DC date: 06/28/2021  Family notified: Rush Landmark Gwyndolyn Saxon)  Transport by: Corey Harold  ?  Per MD patient ready for DC to [facility] . RN, patient, patient's family, and facility notified of DC. Discharge Summary sent to facility. RN given number for report [phone] ask for [facility] [phone]. DC packet on chart. DNR signed by MD attached to patients DC packet.Ambulance transport requested for patient.  CSW signing off.   Final next level of care: Skilled Nursing Facility Barriers to Discharge: No Barriers Identified   Patient Goals and CMS Choice Patient states their goals for this hospitalization and ongoing recovery are:: SNF CMS Medicare.gov Compare Post Acute Care list provided to:: Patient Choice offered to / list presented to : Patient  Discharge Placement              Patient chooses bed at: [facility] Patient to be transferred to facility by: [facility] Name of family member notified: Rush Landmark) Gwyndolyn Saxon Patient and family notified of of transfer: 06/28/21  Discharge Plan and Services                                     Social Determinants of Health (SDOH) Interventions     Readmission Risk Interventions No flowsheet data found.

## 2021-06-28 NOTE — Progress Notes (Signed)
Patient's heart rate has been running in the 40's for the last hour.  Patient is asleep but easily arousable.  Dr. Cyd Silence notified.  Will continue to monitor.    Donah Driver, RN

## 2021-06-28 NOTE — NC FL2 (Signed)
[place] LEVEL OF CARE SCREENING TOOL     IDENTIFICATION  Patient Name: Wendy Erickson Birthdate: 02-Jan-1941 Sex: female Admission Date (Current Location): 06/20/2021  [facility] and [place] Number:  Herbalist and Address:  [facility], [place] [address], [place], [place] [address]      Provider Number: [id number]  Attending Physician Name and Address:  Patrecia Pour, MD  Relative Name and Phone Number:  Rush Landmark) [place] Coach [phone]    Current Level of Care: Hospital Recommended Level of Care: [place] Prior Approval Number:    Date Approved/Denied:   PASRR Number:    Discharge Plan: SNF    Current Diagnoses: Patient Active Problem List   Diagnosis Date Noted   CAP (community acquired pneumonia) 06/20/2021   Leukocytosis 06/20/2021   Anxiety 06/20/2021   DNR (do not resuscitate) 06/20/2021   Acute on chronic respiratory failure with hypoxia ([place]) 06/20/2021   Thyroid function study abnormality 06/02/2021   Numbness 06/02/2021   GI bleed 07/07/2020   Acute blood loss anemia 07/03/2020   Femur fracture, left ([place]) 07/01/2020   Normocytic anemia 06/21/2018   Microcytic hypochromic anemia 06/21/2018   Fall    Subarachnoid hemorrhage ([place]) 05/25/2016   Subdural hematoma 05/25/2016   SAH (subarachnoid hemorrhage) ([place]) 05/25/2016   Laceration of face-right supraorbital 05/25/2016   Trigeminal neuralgia of right side of face 06/09/2015   Abnormality of gait 12/05/2014   Dysphagia, pharyngoesophageal phase 12/05/2014   Cholelithiasis 05/14/2014   Abdominal pain, epigastric 52/84/1324   Systolic murmur 40/05/2724   Chest pain 05/11/2014   Atypical chest pain 05/11/2014   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   Hydronephrosis of right kidney 04/13/2013   Absent kidney, acquired 04/13/2013   Pyelonephritis 04/11/2013   AKI (acute kidney injury) ([place]) 04/11/2013   HTN (hypertension)  04/11/2013    Orientation RESPIRATION BLADDER Height & Weight     Self, Place, Situation  O2 (Nasal Cannula 4 liters) External catheter, Continent (External Urinary Catheter) Weight: 160 lb 11.5 oz (72.9 kg) Height:  5\' 5"  (165.1 cm)  BEHAVIORAL SYMPTOMS/MOOD NEUROLOGICAL BOWEL NUTRITION STATUS      Continent (WDL) Diet (Please see discharge summary)  AMBULATORY STATUS COMMUNICATION OF NEEDS Skin     Verbally Normal (WDL)                       Personal Care Assistance Level of Assistance  Bathing, Feeding, Dressing Bathing Assistance: Limited assistance Feeding assistance: Limited assistance Dressing Assistance: Limited assistance     Functional Limitations Info  Sight, Hearing, Speech   Hearing Info: Adequate Speech Info: Adequate    SPECIAL CARE FACTORS FREQUENCY                       Contractures Contractures Info: Not present    Additional Factors Info  Code Status, Allergies, Psychotropic Code Status Info: DNR Allergies Info: Pineapple Psychotropic Info: busPIRone (BUSPAR) tablet 7.5 mg 3 times daily,mirtazapine (REMERON) tablet 7.5 mg daily at bedtime,PARoxetine (PAXIL-CR) 24 hr tablet 37.5 mg daily at bedtime         Current Medications (06/28/2021):  This is the current hospital active medication list Current Facility-Administered Medications  Medication Dose Route Frequency Provider Last Rate Last Admin   acetaminophen (TYLENOL) tablet 650 mg  650 mg Oral Q6H PRN Fuller Plan A, MD   650 mg at 06/27/21 2302   atorvastatin (LIPITOR) tablet 40  mg  40 mg Oral QHS Smith, Rondell A, MD   40 mg at 06/27/21 2302   busPIRone (BUSPAR) tablet 7.5 mg  7.5 mg Oral TID Fuller Plan A, MD   7.5 mg at 06/28/21 1962   cloNIDine (CATAPRES) tablet 0.2 mg  0.2 mg Oral BID Fuller Plan A, MD   0.2 mg at 06/28/21 0923   enoxaparin (LOVENOX) injection 30 mg  30 mg Subcutaneous Q24H Lyndee Leo, RPH   30 mg at 06/27/21 2300   ferrous sulfate tablet 325 mg   325 mg Oral Q breakfast Patrecia Pour, MD   325 mg at 06/28/21 2297   gabapentin (NEURONTIN) tablet 600 mg  600 mg Oral TID Fuller Plan A, MD   600 mg at 06/28/21 9892   hydrALAZINE (APRESOLINE) tablet 50 mg  50 mg Oral Q6H PRN Fuller Plan A, MD       loratadine (CLARITIN) tablet 10 mg  10 mg Oral Daily Fuller Plan A, MD   10 mg at 06/28/21 1194   magic mouthwash  10 mL Oral BID Fuller Plan A, MD   10 mL at 06/28/21 1740   mirtazapine (REMERON) tablet 7.5 mg  7.5 mg Oral QHS Smith, Rondell A, MD   7.5 mg at 06/27/21 2303   Muscle Rub CREA   Topical QHS Norval Morton, MD   Given at 06/27/21 2301   PARoxetine (PAXIL-CR) 24 hr tablet 37.5 mg  37.5 mg Oral QHS Smith, Rondell A, MD   37.5 mg at 06/27/21 2302   polyethylene glycol (MIRALAX / GLYCOLAX) packet 17 g  17 g Oral Daily Tamala Julian, Rondell A, MD   17 g at 06/28/21 8144   polyvinyl alcohol (LIQUIFILM TEARS) 1.4 % ophthalmic solution 2 drop  2 drop Both Eyes PRN Vernelle Emerald, MD   2 drop at 06/27/21 2301   senna (SENOKOT) tablet 8.6 mg  1 tablet Oral BID Fuller Plan A, MD   8.6 mg at 06/28/21 8185     Discharge Medications: Please see discharge summary for a list of discharge medications.  Relevant Imaging Results:  Relevant Lab Results:   Additional Information [id number]  Milas Gain, LCSWA

## 2021-06-28 NOTE — Progress Notes (Signed)
Report attempted x2 with Lelon Frohlich, Care Coordinator at [facility] @ 1312 & 1327 respectively. [facility] transportation arranged. ETA within the hour. Pt aware of plan of care & disposition. IVs & tele monitor removed. All questions and concerns addressed. Discharge packet complete and ready for transport.

## 2021-06-29 DIAGNOSIS — G709 Myoneural disorder, unspecified: Secondary | ICD-10-CM | POA: Diagnosis not present

## 2021-06-29 DIAGNOSIS — L853 Xerosis cutis: Secondary | ICD-10-CM | POA: Diagnosis not present

## 2021-06-29 DIAGNOSIS — S51812A Laceration without foreign body of left forearm, initial encounter: Secondary | ICD-10-CM | POA: Diagnosis not present

## 2021-06-29 DIAGNOSIS — G118 Other hereditary ataxias: Secondary | ICD-10-CM | POA: Diagnosis not present

## 2021-07-06 ENCOUNTER — Telehealth: Payer: Self-pay

## 2021-07-06 DIAGNOSIS — R1312 Dysphagia, oropharyngeal phase: Secondary | ICD-10-CM | POA: Diagnosis not present

## 2021-07-06 DIAGNOSIS — G118 Other hereditary ataxias: Secondary | ICD-10-CM | POA: Diagnosis not present

## 2021-07-06 NOTE — Telephone Encounter (Signed)
Phone call placed to patient's son, Rush Landmark, to introduce Palliative Care and to update him that  RN/SW will make a visit on 07/07/21. Son requests a call after visit as he is not local. Son shared some of patient's PMH and also some of her social history. Will update SW.

## 2021-07-07 ENCOUNTER — Non-Acute Institutional Stay: Payer: Medicare Other

## 2021-07-07 ENCOUNTER — Other Ambulatory Visit: Payer: Self-pay

## 2021-07-07 VITALS — BP 122/60 | HR 88 | Resp 18

## 2021-07-07 DIAGNOSIS — Z515 Encounter for palliative care: Secondary | ICD-10-CM

## 2021-07-12 DIAGNOSIS — F331 Major depressive disorder, recurrent, moderate: Secondary | ICD-10-CM | POA: Diagnosis not present

## 2021-07-13 NOTE — Progress Notes (Signed)
PATIENT NAME: Wendy Erickson DOB: Mar 16, 1941 MRN: [id number]  PRIMARY CARE PROVIDER: Pcp, No  RESPONSIBLE PARTY:  Acct ID - Guarantor Home Phone Work Phone Relationship Acct Type  [id number] Wendy Erickson [phone]  Self P/F     [place], [place], [place] [id number]    PLAN OF CARE and INTERVENTION:  ADVANCE CARE PLANNING/GOALS OF CARE: Comfort and safety. PATIENT/CAREGIVER EDUCATION: Education regarding Palliative care services, safety DISEASE STATUS:  RN Palliative care visit completed today. Met with patient in her room at [facility] Patient in her bed, awake and aware. Able to answer questions, speech unclear and difficult to understand. Patient recently d/c from the hospital due to worsening shortness of breath and cough. She was in the hospital from 11/13-11/21. She returned to the facility. She remains on supplemental oxygen, Able to deny any shortness of breath. Reports numbness to her feet. Also, shared that she had difficulty sleeping the night before but unable to determine a reason for this. Patient is dependent on facility caregivers to complete ADLs. Son, Wendy Erickson, is involved in her care and visits frequently. Bill shared that patient has some anxiety and regrets about not being able to be a "better grandma".  Bill requested that a chaplain or minister visit patient. Will relay this to Palliative care SW.   HISTORY OF PRESENT ILLNESS: This is a 80 -year-old female with PMH that including but not limited to spinocerebellar ataxia, acute on chronic respiratory failure, oxygen dependent, dysphagia, and pneumonia. Palliative care has been asked to follow for additional support, goals of care and complex decision making.  CODE STATUS: DNR PPS: 30%   PHYSICAL EXAM:   VITALS: Today's Vitals   07/07/21 1300  BP: 122/60  Pulse: 88  Resp: 18  SpO2: 96%  PainSc: 2   PainLoc: Foot      PHYSICAL EXAM:   Lungs: CTA, no audible congestion, Oxygen dependent  CV: RRR   Skin: cool, dry, no reports of rashes, lesions, wounds  Extremities: No Edema noted  Neuro: Positive for weakness, progressive neurological decline,     Cornelius Moras, RN

## 2021-07-14 DIAGNOSIS — G118 Other hereditary ataxias: Secondary | ICD-10-CM | POA: Diagnosis not present

## 2021-07-14 DIAGNOSIS — K219 Gastro-esophageal reflux disease without esophagitis: Secondary | ICD-10-CM | POA: Diagnosis not present

## 2021-07-23 DIAGNOSIS — L539 Erythematous condition, unspecified: Secondary | ICD-10-CM | POA: Diagnosis not present

## 2021-07-23 DIAGNOSIS — B372 Candidiasis of skin and nail: Secondary | ICD-10-CM | POA: Diagnosis not present

## 2021-07-26 DIAGNOSIS — F331 Major depressive disorder, recurrent, moderate: Secondary | ICD-10-CM | POA: Diagnosis not present

## 2021-07-27 DIAGNOSIS — R4182 Altered mental status, unspecified: Secondary | ICD-10-CM | POA: Diagnosis not present

## 2021-07-27 DIAGNOSIS — N390 Urinary tract infection, site not specified: Secondary | ICD-10-CM | POA: Diagnosis not present

## 2021-07-28 DIAGNOSIS — E875 Hyperkalemia: Secondary | ICD-10-CM | POA: Diagnosis not present

## 2021-07-28 DIAGNOSIS — E870 Hyperosmolality and hypernatremia: Secondary | ICD-10-CM | POA: Diagnosis not present

## 2021-07-28 DIAGNOSIS — E860 Dehydration: Secondary | ICD-10-CM | POA: Diagnosis not present

## 2021-07-28 DIAGNOSIS — N3000 Acute cystitis without hematuria: Secondary | ICD-10-CM | POA: Diagnosis not present

## 2021-07-29 DIAGNOSIS — Z79899 Other long term (current) drug therapy: Secondary | ICD-10-CM | POA: Diagnosis not present

## 2021-07-29 DIAGNOSIS — N390 Urinary tract infection, site not specified: Secondary | ICD-10-CM | POA: Diagnosis not present

## 2021-08-10 DIAGNOSIS — R279 Unspecified lack of coordination: Secondary | ICD-10-CM | POA: Diagnosis not present

## 2021-08-10 DIAGNOSIS — S72352A Displaced comminuted fracture of shaft of left femur, initial encounter for closed fracture: Secondary | ICD-10-CM | POA: Diagnosis not present

## 2021-08-12 DIAGNOSIS — R279 Unspecified lack of coordination: Secondary | ICD-10-CM | POA: Diagnosis not present

## 2021-08-12 DIAGNOSIS — S72352A Displaced comminuted fracture of shaft of left femur, initial encounter for closed fracture: Secondary | ICD-10-CM | POA: Diagnosis not present

## 2021-08-17 DIAGNOSIS — S72352A Displaced comminuted fracture of shaft of left femur, initial encounter for closed fracture: Secondary | ICD-10-CM | POA: Diagnosis not present

## 2021-08-17 DIAGNOSIS — R279 Unspecified lack of coordination: Secondary | ICD-10-CM | POA: Diagnosis not present

## 2021-08-18 DIAGNOSIS — R627 Adult failure to thrive: Secondary | ICD-10-CM | POA: Diagnosis not present

## 2021-08-18 DIAGNOSIS — R5383 Other fatigue: Secondary | ICD-10-CM | POA: Diagnosis not present

## 2021-08-18 DIAGNOSIS — G3281 Cerebellar ataxia in diseases classified elsewhere: Secondary | ICD-10-CM | POA: Diagnosis not present

## 2021-09-02 DIAGNOSIS — R5383 Other fatigue: Secondary | ICD-10-CM | POA: Diagnosis not present

## 2021-09-02 DIAGNOSIS — R208 Other disturbances of skin sensation: Secondary | ICD-10-CM | POA: Diagnosis not present

## 2021-09-02 DIAGNOSIS — R627 Adult failure to thrive: Secondary | ICD-10-CM | POA: Diagnosis not present

## 2021-09-02 DIAGNOSIS — G3281 Cerebellar ataxia in diseases classified elsewhere: Secondary | ICD-10-CM | POA: Diagnosis not present

## 2021-09-07 DIAGNOSIS — J9621 Acute and chronic respiratory failure with hypoxia: Secondary | ICD-10-CM | POA: Diagnosis not present

## 2021-09-07 DIAGNOSIS — R451 Restlessness and agitation: Secondary | ICD-10-CM | POA: Diagnosis not present

## 2021-09-07 DIAGNOSIS — G3281 Cerebellar ataxia in diseases classified elsewhere: Secondary | ICD-10-CM | POA: Diagnosis not present

## 2021-09-10 DIAGNOSIS — J9621 Acute and chronic respiratory failure with hypoxia: Secondary | ICD-10-CM | POA: Diagnosis not present

## 2021-09-10 DIAGNOSIS — R52 Pain, unspecified: Secondary | ICD-10-CM | POA: Diagnosis not present

## 2021-09-10 DIAGNOSIS — G3281 Cerebellar ataxia in diseases classified elsewhere: Secondary | ICD-10-CM | POA: Diagnosis not present

## 2021-10-06 DEATH — deceased

## 2023-07-09 DEATH — deceased
